# Patient Record
Sex: Male | Born: 1965 | Race: White | Hispanic: No | Smoking: Current every day smoker
Health system: Southern US, Community
[De-identification: ages and names within clinical notes are randomized; demographics above are authoritative.]

## PROBLEM LIST (undated history)

## (undated) DIAGNOSIS — I10 Essential (primary) hypertension: Secondary | ICD-10-CM

## (undated) DIAGNOSIS — C220 Liver cell carcinoma: Secondary | ICD-10-CM

## (undated) DIAGNOSIS — E119 Type 2 diabetes mellitus without complications: Secondary | ICD-10-CM

## (undated) DIAGNOSIS — B1920 Unspecified viral hepatitis C without hepatic coma: Secondary | ICD-10-CM

## (undated) DIAGNOSIS — L409 Psoriasis, unspecified: Secondary | ICD-10-CM

## (undated) DIAGNOSIS — F1910 Other psychoactive substance abuse, uncomplicated: Secondary | ICD-10-CM

## (undated) DIAGNOSIS — I639 Cerebral infarction, unspecified: Secondary | ICD-10-CM

## (undated) HISTORY — DX: Liver cell carcinoma: C22.0

## (undated) HISTORY — PX: KNEE SURGERY: SHX244

## (undated) HISTORY — DX: Other psychoactive substance abuse, uncomplicated: F19.10

## (undated) HISTORY — DX: Psoriasis, unspecified: L40.9

---

## 2005-10-14 ENCOUNTER — Emergency Department: Payer: Self-pay | Admitting: Unknown Physician Specialty

## 2005-10-15 ENCOUNTER — Emergency Department: Payer: Self-pay | Admitting: Emergency Medicine

## 2005-10-17 ENCOUNTER — Emergency Department: Payer: Self-pay | Admitting: General Practice

## 2005-10-19 ENCOUNTER — Emergency Department: Payer: Self-pay | Admitting: Emergency Medicine

## 2006-02-20 ENCOUNTER — Emergency Department: Payer: Self-pay | Admitting: Emergency Medicine

## 2006-05-26 ENCOUNTER — Emergency Department: Payer: Self-pay | Admitting: Emergency Medicine

## 2008-02-27 ENCOUNTER — Inpatient Hospital Stay: Payer: Self-pay | Admitting: Specialist

## 2008-04-17 ENCOUNTER — Emergency Department: Payer: Self-pay | Admitting: Emergency Medicine

## 2008-04-24 ENCOUNTER — Inpatient Hospital Stay: Payer: Self-pay | Admitting: Unknown Physician Specialty

## 2009-03-08 ENCOUNTER — Emergency Department: Payer: Self-pay | Admitting: Emergency Medicine

## 2012-09-27 ENCOUNTER — Emergency Department: Payer: Self-pay | Admitting: Internal Medicine

## 2015-02-22 ENCOUNTER — Inpatient Hospital Stay
Admission: EM | Admit: 2015-02-22 | Discharge: 2015-02-24 | DRG: 581 | Disposition: A | Discharge status: Home / Self Care | Payer: Self-pay | Attending: Internal Medicine | Admitting: Internal Medicine

## 2015-02-22 ENCOUNTER — Encounter: Payer: Self-pay | Admitting: Emergency Medicine

## 2015-02-22 DIAGNOSIS — Z7984 Long term (current) use of oral hypoglycemic drugs: Secondary | ICD-10-CM

## 2015-02-22 DIAGNOSIS — F17210 Nicotine dependence, cigarettes, uncomplicated: Secondary | ICD-10-CM | POA: Diagnosis present

## 2015-02-22 DIAGNOSIS — IMO0002 Reserved for concepts with insufficient information to code with codable children: Secondary | ICD-10-CM | POA: Insufficient documentation

## 2015-02-22 DIAGNOSIS — L03314 Cellulitis of groin: Secondary | ICD-10-CM | POA: Diagnosis present

## 2015-02-22 DIAGNOSIS — B182 Chronic viral hepatitis C: Secondary | ICD-10-CM | POA: Diagnosis present

## 2015-02-22 DIAGNOSIS — Z794 Long term (current) use of insulin: Secondary | ICD-10-CM

## 2015-02-22 DIAGNOSIS — A630 Anogenital (venereal) warts: Secondary | ICD-10-CM | POA: Diagnosis present

## 2015-02-22 DIAGNOSIS — F1010 Alcohol abuse, uncomplicated: Secondary | ICD-10-CM | POA: Diagnosis present

## 2015-02-22 DIAGNOSIS — E1169 Type 2 diabetes mellitus with other specified complication: Secondary | ICD-10-CM

## 2015-02-22 DIAGNOSIS — Z79899 Other long term (current) drug therapy: Secondary | ICD-10-CM

## 2015-02-22 DIAGNOSIS — L0291 Cutaneous abscess, unspecified: Secondary | ICD-10-CM | POA: Diagnosis present

## 2015-02-22 DIAGNOSIS — E1165 Type 2 diabetes mellitus with hyperglycemia: Secondary | ICD-10-CM | POA: Diagnosis present

## 2015-02-22 DIAGNOSIS — L02214 Cutaneous abscess of groin: Principal | ICD-10-CM | POA: Diagnosis present

## 2015-02-22 DIAGNOSIS — I10 Essential (primary) hypertension: Secondary | ICD-10-CM

## 2015-02-22 DIAGNOSIS — L0390 Cellulitis, unspecified: Secondary | ICD-10-CM

## 2015-02-22 HISTORY — DX: Type 2 diabetes mellitus without complications: E11.9

## 2015-02-22 HISTORY — DX: Unspecified viral hepatitis C without hepatic coma: B19.20

## 2015-02-22 HISTORY — DX: Essential (primary) hypertension: I10

## 2015-02-22 LAB — BASIC METABOLIC PANEL
Anion gap: 11 (ref 5–15)
BUN: 12 mg/dL (ref 6–20)
CALCIUM: 9.1 mg/dL (ref 8.9–10.3)
CO2: 24 mmol/L (ref 22–32)
Chloride: 99 mmol/L — ABNORMAL LOW (ref 101–111)
Creatinine, Ser: 0.62 mg/dL (ref 0.61–1.24)
GFR calc Af Amer: >60 mL/min (ref >60)
GFR calc non Af Amer: >60 mL/min (ref >60)
GLUCOSE: 259 mg/dL — AB (ref 65–99)
Potassium: 4 mmol/L (ref 3.5–5.1)
Sodium: 134 mmol/L — ABNORMAL LOW (ref 135–145)

## 2015-02-22 LAB — CBC WITH DIFFERENTIAL/PLATELET
BASOS PCT: 1 %
Basophils Absolute: 0 10*3/uL (ref 0–0.1)
EOS PCT: 1 %
Eosinophils Absolute: 0.1 10*3/uL (ref 0–0.7)
HCT: 47.7 % (ref 40.0–52.0)
Hemoglobin: 16.4 g/dL (ref 13.0–18.0)
Lymphocytes Relative: 16 %
Lymphs Abs: 1.4 10*3/uL (ref 1.0–3.6)
MCH: 32.7 pg (ref 26.0–34.0)
MCHC: 34.3 g/dL (ref 32.0–36.0)
MCV: 95.3 fL (ref 80.0–100.0)
MONO ABS: 1.1 10*3/uL — AB (ref 0.2–1.0)
Monocytes Relative: 12 %
Neutro Abs: 6.5 10*3/uL (ref 1.4–6.5)
Neutrophils Relative %: 72 %
PLATELETS: 137 10*3/uL — AB (ref 150–440)
RBC: 5 MIL/uL (ref 4.40–5.90)
RDW: 12.9 % (ref 11.5–14.5)
WBC: 9.1 10*3/uL (ref 3.8–10.6)

## 2015-02-22 LAB — GLUCOSE, CAPILLARY: Glucose-Capillary: 175 mg/dL — ABNORMAL HIGH (ref 65–99)

## 2015-02-22 LAB — PROTIME-INR
INR: 1.1
PROTHROMBIN TIME: 14.4 s (ref 11.4–15.0)

## 2015-02-22 LAB — APTT: aPTT: 29 seconds (ref 24–36)

## 2015-02-22 MED ORDER — VANCOMYCIN HCL 10 G IV SOLR
1250.0000 mg | INTRAVENOUS | Status: AC
  Administered 2015-02-22: 1250 mg via INTRAVENOUS
  Filled 2015-02-22: qty 1250

## 2015-02-22 MED ORDER — ONDANSETRON HCL 4 MG/2ML IJ SOLN
4.0000 mg | Freq: Once | INTRAMUSCULAR | Status: AC
  Administered 2015-02-22: 4 mg via INTRAVENOUS
  Filled 2015-02-22: qty 2

## 2015-02-22 MED ORDER — LABETALOL HCL 5 MG/ML IV SOLN
10.0000 mg | INTRAVENOUS | Status: DC | PRN
Start: 2015-02-22 — End: 2015-02-24
  Filled 2015-02-22: qty 4

## 2015-02-22 MED ORDER — MORPHINE SULFATE (PF) 4 MG/ML IV SOLN
4.0000 mg | Freq: Once | INTRAVENOUS | Status: AC
  Administered 2015-02-22: 4 mg via INTRAVENOUS
  Filled 2015-02-22: qty 1

## 2015-02-22 MED ORDER — PIPERACILLIN-TAZOBACTAM 3.375 G IVPB 30 MIN
3.3750 g | Freq: Once | INTRAVENOUS | Status: AC
  Administered 2015-02-22: 3.375 g via INTRAVENOUS
  Filled 2015-02-22: qty 50

## 2015-02-22 MED ORDER — PIPERACILLIN-TAZOBACTAM 3.375 G IVPB
3.3750 g | Freq: Three times a day (TID) | INTRAVENOUS | Status: DC
  Administered 2015-02-23 – 2015-02-24 (×4): 3.375 g via INTRAVENOUS
  Filled 2015-02-22 (×6): qty 50

## 2015-02-22 MED ORDER — ENOXAPARIN SODIUM 40 MG/0.4ML ~~LOC~~ SOLN
40.0000 mg | SUBCUTANEOUS | Status: DC
Start: 2015-02-22 — End: 2015-02-24
  Administered 2015-02-23: 40 mg via SUBCUTANEOUS
  Filled 2015-02-22: qty 0.4

## 2015-02-22 MED ORDER — SODIUM CHLORIDE 0.9 % IV SOLN
1250.0000 mg | Freq: Three times a day (TID) | INTRAVENOUS | Status: DC
  Administered 2015-02-23 – 2015-02-24 (×5): 1250 mg via INTRAVENOUS
  Filled 2015-02-22 (×9): qty 1250

## 2015-02-22 MED ORDER — MORPHINE SULFATE (PF) 2 MG/ML IV SOLN
2.0000 mg | INTRAVENOUS | Status: DC | PRN
  Administered 2015-02-22 – 2015-02-24 (×10): 2 mg via INTRAVENOUS
  Filled 2015-02-22 (×10): qty 1

## 2015-02-22 MED ORDER — HYDRALAZINE HCL 20 MG/ML IJ SOLN
10.0000 mg | Freq: Four times a day (QID) | INTRAMUSCULAR | Status: DC | PRN
  Administered 2015-02-22: 10 mg via INTRAVENOUS
  Filled 2015-02-22: qty 1

## 2015-02-22 MED ORDER — HYDRALAZINE HCL 20 MG/ML IJ SOLN: 10.0000 mg | INTRAMUSCULAR | Status: DC | PRN

## 2015-02-22 MED ORDER — LIDOCAINE HCL (PF) 1 % IJ SOLN
INTRAMUSCULAR | Status: AC
  Filled 2015-02-22: qty 10

## 2015-02-22 MED ORDER — SODIUM CHLORIDE 0.9 % IV BOLUS (SEPSIS)
1000.0000 mL | Freq: Once | INTRAVENOUS | Status: AC
  Administered 2015-02-22: 1000 mL via INTRAVENOUS

## 2015-02-22 MED ORDER — OXYCODONE-ACETAMINOPHEN 5-325 MG PO TABS: 1.0000 | ORAL_TABLET | ORAL | Status: DC | PRN

## 2015-02-22 MED ORDER — INSULIN ASPART 100 UNIT/ML ~~LOC~~ SOLN: 0.0000 [IU] | Freq: Three times a day (TID) | SUBCUTANEOUS | Status: DC

## 2015-02-22 MED ORDER — LABETALOL HCL 5 MG/ML IV SOLN
10.0000 mg | INTRAVENOUS | Status: DC | PRN
  Filled 2015-02-22: qty 4

## 2015-02-22 MED ORDER — AMLODIPINE BESYLATE 10 MG PO TABS
10.0000 mg | ORAL_TABLET | Freq: Every day | ORAL | Status: DC
  Administered 2015-02-22 – 2015-02-24 (×3): 10 mg via ORAL
  Filled 2015-02-22: qty 2
  Filled 2015-02-22 (×2): qty 1

## 2015-02-22 NOTE — ED Notes (Addendum)
Assisted pt to toilet. Pt bled while standing to urinate. Bleeding included 3 clots approximately quarter to half dollar size. Pt sheets and gown changed. Dr Marcelene Butte took a look at it and requested me to page Dr. Azalee Course, who should still be in the hospital.
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
## 2015-02-22 NOTE — Progress Notes (Addendum)
ED nurse, Catalina Antigua RN, gave RN report. Pt current BP 185/99, ask matt to recheck BP and call back prior to transferring pt.   Angus Seller

## 2015-02-22 NOTE — Progress Notes (Signed)
Pt arrived on unit. Current BP 211/103. Supervisor made aware of the BP and situation. 10mg  of hydralazine was given due to PRN order. Will recheck BP in one hour, and reassess pt. Will continue to monitor pt.   Angus Seller

## 2015-02-22 NOTE — Consult Note (Signed)
ANTIBIOTIC CONSULT NOTE - INITIAL  Pharmacy Consult for Vancomycin/Zosyn Indication: Cellulitis  Allergies no known allergies  Patient Measurements: Height: 6\' 1"  (185.4 cm) Weight: 210 lb (95.255 kg) IBW/kg (Calculated) : 79.9 Adjusted Body Weight: 86kg   Vital Signs: Temp: 98.2 F (36.8 C) (11/24 1554) Temp Source: Oral (11/24 1554) BP: 208/104 mmHg (11/24 1554) Pulse Rate: 84 (11/24 1554) Intake/Output from previous day:   Intake/Output from this shift:    Labs:  Recent Labs  02/22/15 1437  WBC 9.1  HGB 16.4  PLT 137*  CREATININE 0.62   Estimated Creatinine Clearance: 123.5 mL/min (by C-G formula based on Cr of 0.62).  Microbiology: No results found for this or any previous visit (from the past 720 hour(s)).  Medical History: Past Medical History  Diagnosis Date  . Diabetes mellitus without complication ([place])   . Hypertension   . Hepatitis C     Medications:  Scheduled:  . amLODipine  10 mg Oral Daily  . [START ON 02/23/2015] insulin aspart  0-9 Units Subcutaneous TID WC  . lidocaine (PF)       Infusions:  . piperacillin-tazobactam    . [START ON 02/23/2015] piperacillin-tazobactam (ZOSYN)  IV    . vancomycin    . [START ON 02/23/2015] vancomycin     Assessment: 49 y.o. male who presents for evaluation of 5 days swelling on the right groin, gradual onset, constant onset, worsening, associated with pain which is moderate to severe.  Goal of Therapy:  Vancomycin trough level 10-15 mcg/ml  Plan:  Ordered Vancomycin 1250mg  IV once (~19:00) followed by Vancomycin 1250mg  IV Q8H to begin 11/25 at 01:00.   Trough level to be drawn prior to 5th dose on 11/26 at 00:30.  Ordered Zosyn 3.375g EI Q8H.    Olivia Canter, RPh Clinical Pharmacist 02/22/2015,6:55 PM
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
## 2015-02-22 NOTE — ED Notes (Signed)
Cleaned around incision site, no active bleeding at this time.

## 2015-02-22 NOTE — Progress Notes (Signed)
Pt. current BP is 197/88. Hydralazine 10mg  given at 2028. [facility] doc notified. New orders given. Labetalol 10mg  IV Q2 PRN and Hydralazine 10mg  IV Q4 PRN. Will continue to monitor pt.  Angus Seller

## 2015-02-22 NOTE — ED Notes (Signed)
Last week, noticed a "knot" to right groin.  Area "busted" and has been draining a thick, sticky bloody drainage.  A second "knot" area noticed to pubis area.  No drainage noted from second area.  First area continues to drain.

## 2015-02-22 NOTE — ED Provider Notes (Signed)
-----------------------------------------   6:00 PM on 02/22/2015 -----------------------------------------   Blood pressure 208/104, pulse 84, temperature 98.2 F (36.8 C), temperature source Oral, resp. rate 16, height 6\' 1"  (1.854 m), weight 210 lb (95.255 kg), SpO2 93 %.  Assuming care from Dr. Edd Fabian  In short, Johnny Kelley is a 49 y.o. male with a chief complaint of Abscess .  Refer to the original H&P for additional details.  The current plan of care is to follow-up with surgical consultation. Dr. Heath Lark came to see and evaluate the patient here in emergency department did bedside I&D. She felt patient required further medical admission and overnight observation for wound care etc.  Daymon Larsen, MD 02/22/15 [phone]
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
## 2015-02-23 LAB — CBC
HEMATOCRIT: 45 % (ref 40.0–52.0)
HEMOGLOBIN: 15.5 g/dL (ref 13.0–18.0)
MCH: 32.9 pg (ref 26.0–34.0)
MCHC: 34.5 g/dL (ref 32.0–36.0)
MCV: 95.6 fL (ref 80.0–100.0)
Platelets: 119 10*3/uL — ABNORMAL LOW (ref 150–440)
RBC: 4.71 MIL/uL (ref 4.40–5.90)
RDW: 13.3 % (ref 11.5–14.5)
WBC: 8.5 10*3/uL (ref 3.8–10.6)

## 2015-02-23 LAB — BASIC METABOLIC PANEL
Anion gap: 7 (ref 5–15)
BUN: 10 mg/dL (ref 6–20)
CO2: 26 mmol/L (ref 22–32)
CREATININE: 0.57 mg/dL — AB (ref 0.61–1.24)
Calcium: 8.3 mg/dL — ABNORMAL LOW (ref 8.9–10.3)
Chloride: 101 mmol/L (ref 101–111)
GFR calc non Af Amer: >60 mL/min (ref >60)
Glucose, Bld: 201 mg/dL — ABNORMAL HIGH (ref 65–99)
Potassium: 3.8 mmol/L (ref 3.5–5.1)
SODIUM: 134 mmol/L — AB (ref 135–145)

## 2015-02-23 LAB — GLUCOSE, CAPILLARY
GLUCOSE-CAPILLARY: 241 mg/dL — AB (ref 65–99)
GLUCOSE-CAPILLARY: 247 mg/dL — AB (ref 65–99)
GLUCOSE-CAPILLARY: 331 mg/dL — AB (ref 65–99)
Glucose-Capillary: 268 mg/dL — ABNORMAL HIGH (ref 65–99)
Glucose-Capillary: 260 mg/dL — ABNORMAL HIGH (ref 65–99)
Glucose-Capillary: 225 mg/dL — ABNORMAL HIGH (ref 65–99)

## 2015-02-23 LAB — HEMOGLOBIN A1C
HEMOGLOBIN A1C: 9.9 % — AB (ref 4.0–6.0)
Hgb A1c MFr Bld: 9.8 % — ABNORMAL HIGH (ref 4.0–6.0)

## 2015-02-23 MED ORDER — INSULIN ASPART 100 UNIT/ML ~~LOC~~ SOLN
0.0000 [IU] | Freq: Three times a day (TID) | SUBCUTANEOUS | Status: DC
  Administered 2015-02-23 (×2): 5 [IU] via SUBCUTANEOUS
  Administered 2015-02-23 – 2015-02-24 (×3): 3 [IU] via SUBCUTANEOUS
  Administered 2015-02-24: 5 [IU] via SUBCUTANEOUS
  Filled 2015-02-23: qty 3
  Filled 2015-02-23 (×2): qty 5
  Filled 2015-02-23: qty 3
  Filled 2015-02-23: qty 5
  Filled 2015-02-23: qty 3

## 2015-02-23 MED ORDER — INSULIN ASPART 100 UNIT/ML ~~LOC~~ SOLN
7.0000 [IU] | Freq: Once | SUBCUTANEOUS | Status: AC
  Administered 2015-02-23: 7 [IU] via SUBCUTANEOUS
  Filled 2015-02-23: qty 7

## 2015-02-23 MED ORDER — NICOTINE 21 MG/24HR TD PT24
21.0000 mg | MEDICATED_PATCH | Freq: Every day | TRANSDERMAL | Status: DC
  Filled 2015-02-23 (×2): qty 1

## 2015-02-23 MED ORDER — METFORMIN HCL 850 MG PO TABS
850.0000 mg | ORAL_TABLET | Freq: Two times a day (BID) | ORAL | Status: DC
  Administered 2015-02-23 – 2015-02-24 (×2): 850 mg via ORAL
  Filled 2015-02-23 (×3): qty 1

## 2015-02-23 NOTE — Care Management (Addendum)
Care management consult for uninsured patient without PCP.  Discussed case with attending Dr Posey Pronto. Patient will need home meds filled. Unfortunately Medication Management clinic is not open today due to [facility] schedule. Contacted Mary at our pharmacy and medications can not be filled on site per policy. Patient was setup with [place] program and total cost of medications to patient will be $9.00 at listed pharmacies of which [facility] participates.  Also spoke with patient who is alert and oriented and stated that he makes approximately $30 a week and that it will be hard for him to come up with money for meds but that he thinks he can get the money from family. Patient given applications for [facility] and [facility] and I explained in great detail how to apply for these services. Patient expressed understanding and agrees to apply. Reviewed patient condition and medications and did teaching with patient concerning each medication and the importance of controlling blood sugar, blood pressure and taking antibiotic as prescribed. Patient expressed understanding.  Have concerns about patient compliance after discharge however patient given multiple resources to assist himself with medical care.  [facility] discount card and Rx on chart along with list of participating pharmacies.  Expect discharge tomorrow.

## 2015-02-23 NOTE — Progress Notes (Signed)
Put in order for dressing change per Dr. Caesar Chestnut for dressing change per shift. Pack with iodoform gauze and 4 x 4 on top.

## 2015-02-23 NOTE — Progress Notes (Signed)
4X4 Gauze and abdominal pad dressings were reinforced to the Right groin area due saturation of previous 4X4 gauze dressing. There are no dressing change orders at this time. Will continue to assess area.  Angus Seller

## 2015-02-23 NOTE — Progress Notes (Signed)
49 yr old male with multiple medical issues including Hepatitis C, uncontrolled HTN, diabetes and alcohol abuse, s/p Right groin abscess I&D.  Patient states pain much improved today, feels as if it is getting better.  Patient denies any fever, chills, nausea ,or vomiting.   Filed Vitals:   02/23/15 0942 02/23/15 1133  BP: 175/91 173/89  Pulse: 77 79  Temp:  98.6 F (37 C)  Resp:  17   PE:  Gen: NAD, AAOx3 Res: CTAB/L Cardio: RRR Groin incision: packing removed, clean base, erythema and induration much improved as well    A/P: Continue changing packing with 1' iodoform BID, will need to teach patient to pack himself.  He may shower just change packing afterward.

## 2015-02-23 NOTE — Progress Notes (Signed)
Patient ID: Johnny Kelley, male   DOB: 02/05/1966, 49 y.o.   MRN: [id number] [place] at [place] NAME: Johnny Kelley    MR#:  [id number]  DATE OF BIRTH:  11-24-1965  SUBJECTIVE:   Came in after he developed right groin abscess. REVIEW OF SYSTEMS:   Review of Systems  Constitutional: Negative for fever, chills and weight loss.  HENT: Negative for ear discharge, ear pain and nosebleeds.   Eyes: Negative for blurred vision, pain and discharge.  Respiratory: Negative for sputum production, shortness of breath, wheezing and stridor.   Cardiovascular: Negative for chest pain, palpitations, orthopnea and PND.  Gastrointestinal: Negative for nausea, vomiting, abdominal pain and diarrhea.  Genitourinary: Negative for urgency and frequency.  Musculoskeletal: Negative for back pain and joint pain.  Skin:       Abscess right groin  Neurological: Positive for weakness. Negative for sensory change, speech change and focal weakness.  Psychiatric/Behavioral: Negative for depression and hallucinations. The patient is not nervous/anxious.    Tolerating Diet: Yes Tolerating PT: Not needed  DRUG ALLERGIES:  No Known Allergies  VITALS:  Blood pressure 173/89, pulse 79, temperature 98.6 F (37 C), temperature source Oral, resp. rate 17, height 6\' 1"  (1.854 m), weight 98.839 kg (217 lb 14.4 oz), SpO2 97 %.  PHYSICAL EXAMINATION:   Physical Exam  GENERAL:  49 y.o.-year-old patient lying in the bed with no acute distress.  EYES: Pupils equal, round, reactive to light and accommodation. No scleral icterus. Extraocular muscles intact.  HEENT: Head atraumatic, normocephalic. Oropharynx and nasopharynx clear.  NECK:  Supple, no jugular venous distention. No thyroid enlargement, no tenderness.  LUNGS: Normal breath sounds bilaterally, no wheezing, rales, rhonchi. No use of accessory muscles of respiration.  CARDIOVASCULAR: S1, S2 normal. No  murmurs, rubs, or gallops.  ABDOMEN: Soft, nontender, nondistended. Bowel sounds present. No organomegaly or mass.  EXTREMITIES: No cyanosis, clubbing or edema b/l.   Right groin abscess status post I&D and packing present NEUROLOGIC: Cranial nerves II through XII are intact. No focal Motor or sensory deficits b/l.   PSYCHIATRIC: The patient is alert and oriented x 3.  SKIN: No obvious rash, lesion, or ulcer.    LABORATORY PANEL:   CBC  Recent Labs Lab 02/23/15 0528  WBC 8.5  HGB 15.5  HCT 45.0  PLT 119*    Chemistries   Recent Labs Lab 02/23/15 0528  NA 134*  K 3.8  CL 101  CO2 26  GLUCOSE 201*  BUN 10  CREATININE 0.57*  CALCIUM 8.3*    Cardiac Enzymes No results for input(s): TROPONINI in the last 168 hours.  RADIOLOGY:  No results found.   ASSESSMENT AND PLAN:   *Cellulitis and abscess in right groin Status post I and D by surgery. Patient said he has history of staph infection a few years ago Continue IV vancomycin and Zosyn for now. Change to by mouth Bactrim in a.m.  * Uncontrolled hypertension Not taking medications at home. Start on amlodipine 10 mg. Given hydralazine injection as needed for elevated systolic blood pressure.  * Diabetes We will keep him on insulin sliding scale coverage.  -Start on by mouth metformin  * Chronic hepatitis C He is not following with any doctor currently  * Smoking not motivated on smoking cessation 3 minutes spent   Management to help with medication  Case discussed with Care Management/Social Worker. Management plans discussed with the patient, family and they are in  agreement.  CODE STATUS: Full  DVT Prophylaxis: Lovenox  TOTAL TIME TAKING CARE OF THIS PATIENT: 30 minutes.  >50% time spent on counselling and coordination of care  POSSIBLE D/C IN one DAYS, DEPENDING ON CLINICAL CONDITION.   Johnny Kelley M.D on 02/23/2015 at 2:05 PM  Between 7am to 6pm - Pager - [phone]  After  6pm go to [email or web address] - password [email or web address] [facility]  [place] [facility]  Office  [phone]  CC: Primary care physician; No primary care provider on file.

## 2015-02-23 NOTE — Progress Notes (Signed)
Pt's CBG 331, Prime doc notified. New orders given. 7 units of Novolog to be given at this time and will recheck CBG in 1 hour after admission. Will continue to monitor pt.   Angus Seller

## 2015-02-24 LAB — GLUCOSE, CAPILLARY
GLUCOSE-CAPILLARY: 221 mg/dL — AB (ref 65–99)
Glucose-Capillary: 291 mg/dL — ABNORMAL HIGH (ref 65–99)

## 2015-02-24 LAB — VANCOMYCIN, TROUGH: Vancomycin Tr: 10 ug/mL (ref 10–20)

## 2015-02-24 MED ORDER — OXYCODONE-ACETAMINOPHEN 5-325 MG PO TABS: 1.0000 | ORAL_TABLET | ORAL | Status: DC | PRN

## 2015-02-24 MED ORDER — SULFAMETHOXAZOLE-TRIMETHOPRIM 800-160 MG PO TABS: 1.0000 | ORAL_TABLET | Freq: Two times a day (BID) | ORAL | Status: DC

## 2015-02-24 MED ORDER — METFORMIN HCL 850 MG PO TABS: 850.0000 mg | ORAL_TABLET | Freq: Two times a day (BID) | ORAL | Status: DC

## 2015-02-24 MED ORDER — SULFAMETHOXAZOLE-TRIMETHOPRIM 800-160 MG PO TABS
1.0000 | ORAL_TABLET | Freq: Two times a day (BID) | ORAL | Status: DC
  Administered 2015-02-24: 1 via ORAL
  Filled 2015-02-24: qty 1

## 2015-02-24 MED ORDER — AMLODIPINE BESYLATE 10 MG PO TABS: 10.0000 mg | ORAL_TABLET | Freq: Every day | ORAL | Status: DC

## 2015-02-24 NOTE — Discharge Summary (Signed)
[place] at [place] NAME: Johnny Kelley    MR#:  [id number]  DATE OF BIRTH:  Mar 14, 1966  DATE OF ADMISSION:  02/22/2015 ADMITTING PHYSICIAN: Vaughan Basta, MD  DATE OF DISCHARGE: 02/24/15  PRIMARY CARE PHYSICIAN: No primary care provider on file.    ADMISSION DIAGNOSIS:  Abscess of groin, right [L02.214]  DISCHARGE DIAGNOSIS:  Abscess right groin s/p I and D Uncontrolled DM-2  HTN SECONDARY DIAGNOSIS:   Past Medical History  Diagnosis Date  . Diabetes mellitus without complication ([place])   . Hypertension   . Hepatitis C     HOSPITAL COURSE:  *Cellulitis and abscess in right groin Status post I and D by surgery. Change to by mouth Bactrim today. Pt's nephew to help with dressing changes  * Uncontrolled hypertension Not taking medications at home. Start on amlodipine 10 mg.  * Diabetes We will keep him on insulin sliding scale coverage.  -Started on by mouth metformin  * Chronic hepatitis C He is not following with any doctor currently  D/c home later today Recommended out pt f/u with surgery CONSULTS OBTAINED:  Treatment Team:  Hubbard Robinson, MD  DRUG ALLERGIES:  No Known Allergies  DISCHARGE MEDICATIONS:   Current Discharge Medication List    START taking these medications   Details  amLODipine (NORVASC) 10 MG tablet Take 1 tablet (10 mg total) by mouth daily. Qty: 30 tablet, Refills: 2    metFORMIN (GLUCOPHAGE) 850 MG tablet Take 1 tablet (850 mg total) by mouth 2 (two) times daily with a meal. Qty: 60 tablet, Refills: 0    oxyCODONE-acetaminophen (PERCOCET/ROXICET) 5-325 MG tablet Take 1 tablet by mouth every 4 (four) hours as needed for moderate pain. Qty: 30 tablet, Refills: 0    sulfamethoxazole-trimethoprim (BACTRIM DS,SEPTRA DS) 800-160 MG tablet Take 1 tablet by mouth every 12 (twelve) hours. Qty: 20 tablet, Refills: 0        If you experience worsening of  your admission symptoms, develop shortness of breath, life threatening emergency, suicidal or homicidal thoughts you must seek medical attention immediately by calling 911 or calling your MD immediately  if symptoms less severe.  You Must read complete instructions/literature along with all the possible adverse reactions/side effects for all the Medicines you take and that have been prescribed to you. Take any new Medicines after you have completely understood and accept all the possible adverse reactions/side effects.   Please note  You were cared for by a hospitalist during your hospital stay. If you have any questions about your discharge medications or the care you received while you were in the hospital after you are discharged, you can call the unit and asked to speak with the hospitalist on call if the hospitalist that took care of you is not available. Once you are discharged, your primary care physician will handle any further medical issues. Please note that NO REFILLS for any discharge medications will be authorized once you are discharged, as it is imperative that you return to your primary care physician (or establish a relationship with a primary care physician if you do not have one) for your aftercare needs so that they can reassess your need for medications and monitor your lab values. Today   SUBJECTIVE  Doing well   VITAL SIGNS:  Blood pressure 159/90, pulse 72, temperature 98 F (36.7 C), temperature source Oral, resp. rate 18, height 6\' 1"  (1.854 m), weight 217 lb 14.4 oz (98.839 kg), SpO2  96 %.  I/O:   Intake/Output Summary (Last 24 hours) at 02/24/15 1013 Last data filed at 02/24/15 0900  Gross per 24 hour  Intake   1330 ml  Output   1000 ml  Net    330 ml    PHYSICAL EXAMINATION:  GENERAL:  49 y.o.-year-old patient lying in the bed with no acute distress.  EYES: Pupils equal, round, reactive to light and accommodation. No scleral icterus. Extraocular muscles  intact.  HEENT: Head atraumatic, normocephalic. Oropharynx and nasopharynx clear.  NECK:  Supple, no jugular venous distention. No thyroid enlargement, no tenderness.  LUNGS: Normal breath sounds bilaterally, no wheezing, rales,rhonchi or crepitation. No use of accessory muscles of respiration.  CARDIOVASCULAR: S1, S2 normal. No murmurs, rubs, or gallops.  ABDOMEN: Soft, non-tender, non-distended. Bowel sounds present. No organomegaly or mass.  EXTREMITIES: No pedal edema, cyanosis, or clubbing. Right groin dressing + NEUROLOGIC: Cranial nerves II through XII are intact. Muscle strength 5/5 in all extremities. Sensation intact. Gait not checked.  PSYCHIATRIC: The patient is alert and oriented x 3.  SKIN: No obvious rash, lesion, or ulcer.   DATA REVIEW:   CBC   Recent Labs Lab 02/23/15 0528  WBC 8.5  HGB 15.5  HCT 45.0  PLT 119*    Chemistries   Recent Labs Lab 02/23/15 0528  NA 134*  K 3.8  CL 101  CO2 26  GLUCOSE 201*  BUN 10  CREATININE 0.57*  CALCIUM 8.3*    Microbiology Results   No results found for this or any previous visit (from the past 240 hour(s)).  RADIOLOGY:  No results found.   Management plans discussed with the patient, family and they are in agreement.  CODE STATUS:     Code Status Orders        Start     Ordered   02/22/15 2027  Full code   Continuous     02/22/15 2026      TOTAL TIME TAKING CARE OF THIS PATIENT: 40 minutes.    [name] M.D on 02/24/2015 at 10:13 AM  Between 7am to 6pm - Pager - [phone] After 6pm go to [email or web address] - password [email or web address] [facility]  [place] [facility]  Office  [phone]  CC: Primary care physician; No primary care provider on file.

## 2015-02-24 NOTE — Discharge Instructions (Signed)
Try to see [facility] at [facility] Abscess An abscess is an infected area that contains a collection of pus and debris.It can occur in almost any part of the body. An abscess is also known as a furuncle or boil. CAUSES  An abscess occurs when tissue gets infected. This can occur from blockage of oil or sweat glands, infection of hair follicles, or a minor injury to the skin. As the body tries to fight the infection, pus collects in the area and creates pressure under the skin. This pressure causes pain. People with weakened immune systems have difficulty fighting infections and get certain abscesses more often.  SYMPTOMS Usually an abscess develops on the skin and becomes a painful mass that is red, warm, and tender. If the abscess forms under the skin, you may feel a moveable soft area under the skin. Some abscesses break open (rupture) on their own, but most will continue to get worse without care. The infection can spread deeper into the body and eventually into the bloodstream, causing you to feel ill.  DIAGNOSIS  Your caregiver will take your medical history and perform a physical exam. A sample of fluid may also be taken from the abscess to determine what is causing your infection. TREATMENT  Your caregiver may prescribe antibiotic medicines to fight the infection. However, taking antibiotics alone usually does not cure an abscess. Your caregiver may need to make a small cut (incision) in the abscess to drain the pus. In some cases, gauze is packed into the abscess to reduce pain and to continue draining the area. HOME CARE INSTRUCTIONS   Only take over-the-counter or prescription medicines for pain, discomfort, or fever as directed by your caregiver.  If you were prescribed antibiotics, take them as directed. Finish them even if you start to feel better.  If gauze is used, follow your caregiver's directions for changing the gauze.  To avoid spreading the infection:  Keep your draining  abscess covered with a bandage.  Wash your hands well.  Do not share personal care items, towels, or whirlpools with others.  Avoid skin contact with others.  Keep your skin and clothes clean around the abscess.  Keep all follow-up appointments as directed by your caregiver. SEEK MEDICAL CARE IF:   You have increased pain, swelling, redness, fluid drainage, or bleeding.  You have muscle aches, chills, or a general ill feeling.  You have a fever. MAKE SURE YOU:   Understand these instructions.  Will watch your condition.  Will get help right away if you are not doing well or get worse.   This information is not intended to replace advice given to you by your health care provider. Make sure you discuss any questions you have with your health care provider.   Document Released: 12/25/2004 Document Revised: 09/16/2011 Document Reviewed: 05/30/2011 Elsevier Interactive Patient Education [employer].

## 2015-02-24 NOTE — Care Management Note (Signed)
Case Management Note  Patient Details  Name: KEMARIAN AREVALO MRN: [id number] Date of Birth: 13-Aug-1965  Subjective/Objective:    Provided uninsured Mr Palin with the [facility] Program Discount card, with Rx attached, and a list of participating pharmacies, left in chart by Liliane Channel, case Freight forwarder. All meds obtained through the [facility] Discount program will cost $3.00 each. Liliane Channel provided Mr Mcgillivary with community resources where he can obtain medical care and medications. The [facility] Program is a one time a year medication assistance program.                   Action/Plan:   Expected Discharge Date:                  Expected Discharge Plan:     In-House Referral:     Discharge planning Services     Post Acute Care Choice:    Choice offered to:     DME Arranged:    DME Agency:     HH Arranged:    [place] Agency:     Status of Service:     Medicare Important Message Given:    Date Medicare IM Given:    Medicare IM give by:    Date Additional Medicare IM Given:    Additional Medicare Important Message give by:     If discussed at [place] of Stay Meetings, dates discussed:    Additional Comments:  Tyrail Grandfield A, RN 02/24/2015, 10:29 AM

## 2015-02-24 NOTE — Consult Note (Signed)
ANTIBIOTIC CONSULT NOTE - INITIAL  Pharmacy Consult for Vancomycin/Zosyn Indication: Cellulitis  No Known Allergies  Patient Measurements: Height: 6\' 1"  (185.4 cm) Weight: 217 lb 14.4 oz (98.839 kg) IBW/kg (Calculated) : 79.9 Adjusted Body Weight: 86kg   Vital Signs: Temp: 98.6 F (37 C) (11/25 2030) Temp Source: Oral (11/25 2030) BP: 179/89 mmHg (11/25 2030) Pulse Rate: 81 (11/25 2030) Intake/Output from previous day: 11/25 0701 - 11/26 0700 In: 650 [IV Piggyback:650] Out: 1100 [Urine:1100] Intake/Output from this shift: Total I/O In: 300 [IV Piggyback:300] Out: -   Labs:  Recent Labs  02/22/15 1437 02/23/15 0528  WBC 9.1 8.5  HGB 16.4 15.5  PLT 137* 119*  CREATININE 0.62 0.57*   Estimated Creatinine Clearance: 135.2 mL/min (by C-G formula based on Cr of 0.57).  Microbiology: No results found for this or any previous visit (from the past 720 hour(s)).  Medical History: Past Medical History  Diagnosis Date  . Diabetes mellitus without complication ([place])   . Hypertension   . Hepatitis C     Medications:  Scheduled:  . amLODipine  10 mg Oral Daily  . enoxaparin (LOVENOX) injection  40 mg Subcutaneous Q24H  . insulin aspart  0-9 Units Subcutaneous TID AC & HS  . metFORMIN  850 mg Oral BID WC  . nicotine  21 mg Transdermal Daily  . piperacillin-tazobactam (ZOSYN)  IV  3.375 g Intravenous Q8H  . vancomycin  1,250 mg Intravenous Q8H   Infusions:    Assessment: 49 y.o. male who presents for evaluation of 5 days swelling on the right groin, gradual onset, constant onset, worsening, associated with pain which is moderate to severe.  Goal of Therapy:  Vancomycin trough level 10-15 mcg/ml  Plan:  Vancomycin trough 10 mcg/mL. Expect discharge and change to oral Bactrim tomorrow. No change.    Laural Benes, Pharm.D., BCPS Clinical Pharmacist 02/24/2015,1:08 AM

## 2015-02-24 NOTE — Progress Notes (Signed)
49 yr old male with multiple medical issues including Hepatitis C, uncontrolled HTN, diabetes and alcohol abuse, s/p Right groin abscess I&D.  Patient doing better today, wants to go home. States he has a nephew that can help with packing the wound  Filed Vitals:   02/24/15 0500 02/24/15 0508  BP: 167/91 129/68  Pulse: 72 66  Temp: 98.1 F (36.7 C) 98 F (36.7 C)  Resp: 20 18   PE:  Gen: NAD, AAOx3 Res: CTAB/L Cardio: RRR Groin incision: packing removed, clean base, erythema resolved, induration greatly improved   A/P: Continue changing packing with 1' iodoform BID, will need to teach nephew.  He may shower just change packing afterward.  He can f/u in clinic in 1-2 weeks with [facility] surgical for wound check.

## 2015-02-24 NOTE — Progress Notes (Signed)
Pt A and O x 4. VSS. Pt tolerating diet well. Minimal complaints of pain with meds given to control. No complaints of nausea. IV removed intact, prescriptions given. Case manager met with patient to try to help with filling his prescriptions. Family member instructed on how to do dressing changes. Pt voiced understanding of discharge instructions with no further questions. Pt discharged via wheelchair with nurse aide.   

## 2015-03-02 LAB — WOUND CULTURE

## 2015-04-18 ENCOUNTER — Encounter: Payer: Self-pay | Admitting: *Deleted

## 2015-04-18 ENCOUNTER — Emergency Department
Admission: EM | Admit: 2015-04-18 | Discharge: 2015-04-18 | Disposition: A | Discharge status: Home / Self Care | Payer: Self-pay | Attending: Emergency Medicine | Admitting: Emergency Medicine

## 2015-04-18 DIAGNOSIS — I10 Essential (primary) hypertension: Secondary | ICD-10-CM | POA: Insufficient documentation

## 2015-04-18 DIAGNOSIS — L0390 Cellulitis, unspecified: Secondary | ICD-10-CM

## 2015-04-18 DIAGNOSIS — L0291 Cutaneous abscess, unspecified: Secondary | ICD-10-CM

## 2015-04-18 DIAGNOSIS — F17210 Nicotine dependence, cigarettes, uncomplicated: Secondary | ICD-10-CM | POA: Insufficient documentation

## 2015-04-18 DIAGNOSIS — L02414 Cutaneous abscess of left upper limb: Secondary | ICD-10-CM | POA: Insufficient documentation

## 2015-04-18 DIAGNOSIS — E119 Type 2 diabetes mellitus without complications: Secondary | ICD-10-CM | POA: Insufficient documentation

## 2015-04-18 DIAGNOSIS — Z79899 Other long term (current) drug therapy: Secondary | ICD-10-CM | POA: Insufficient documentation

## 2015-04-18 DIAGNOSIS — Z7984 Long term (current) use of oral hypoglycemic drugs: Secondary | ICD-10-CM | POA: Insufficient documentation

## 2015-04-18 DIAGNOSIS — L03114 Cellulitis of left upper limb: Secondary | ICD-10-CM | POA: Insufficient documentation

## 2015-04-18 LAB — GLUCOSE, CAPILLARY: GLUCOSE-CAPILLARY: 291 mg/dL — AB (ref 65–99)

## 2015-04-18 MED ORDER — SULFAMETHOXAZOLE-TRIMETHOPRIM 800-160 MG PO TABS: 1.0000 | ORAL_TABLET | Freq: Two times a day (BID) | ORAL | Status: DC

## 2015-04-18 MED ORDER — LIDOCAINE HCL (PF) 1 % IJ SOLN
INTRAMUSCULAR | Status: AC
  Filled 2015-04-18: qty 5

## 2015-04-18 MED ORDER — OXYCODONE-ACETAMINOPHEN 5-325 MG PO TABS: 1.0000 | ORAL_TABLET | ORAL | Status: DC | PRN

## 2015-04-18 MED ORDER — CLONIDINE HCL 0.1 MG PO TABS
0.1000 mg | ORAL_TABLET | Freq: Once | ORAL | Status: AC
  Administered 2015-04-18: 0.1 mg via ORAL
  Filled 2015-04-18: qty 1

## 2015-04-18 NOTE — ED Provider Notes (Signed)
[facility] Emergency Department Provider Note  ____________________________________________  Time seen: Approximately 7:52 AM  I have reviewed the triage vital signs and the nursing notes.   HISTORY  Chief Complaint Abscess   HPI Johnny Kelley is a 50 y.o. male with known hypertension, diabetes, and hepatitis C. Patient presents for evaluation of abscess to his left elbow for 2 days. Denies any known trauma.   Past Medical History  Diagnosis Date  . Diabetes mellitus without complication ([place])   . Hypertension   . Hepatitis C     Patient Active Problem List   Diagnosis Date Noted  . Cellulitis and abscess 02/22/2015  . Hypertension 02/22/2015  . Abscess 02/22/2015  . Abscess of groin, right   . DM w/ coma type II, uncontrolled ([place])     Past Surgical History  Procedure Laterality Date  . Knee surgery Right     Current Outpatient Rx  Name  Route  Sig  Dispense  Refill  . amLODipine (NORVASC) 10 MG tablet   Oral   Take 1 tablet (10 mg total) by mouth daily.   30 tablet   2   . metFORMIN (GLUCOPHAGE) 850 MG tablet   Oral   Take 1 tablet (850 mg total) by mouth 2 (two) times daily with a meal.   60 tablet   0   . oxyCODONE-acetaminophen (ROXICET) 5-325 MG tablet   Oral   Take 1-2 tablets by mouth every 4 (four) hours as needed for severe pain.   15 tablet   0   . sulfamethoxazole-trimethoprim (BACTRIM DS,SEPTRA DS) 800-160 MG tablet   Oral   Take 1 tablet by mouth 2 (two) times daily.   20 tablet   0     Allergies Review of patient's allergies indicates no known allergies.  Family History  Problem Relation Age of Onset  . Colon cancer Father   . Diabetes Father     Social History Social History  Substance Use Topics  . Smoking status: Current Every Day Smoker -- 0.50 packs/day    Types: Cigarettes  . Smokeless tobacco: Never Used  . Alcohol Use: Yes     Comment: 4-5 24 oz beer daily    Review of  Systems Constitutional: No fever/chills Eyes: No visual changes. ENT: No sore throat. Cardiovascular: Denies chest pain. Respiratory: Denies shortness of breath. Gastrointestinal: No abdominal pain.  No nausea, no vomiting.  No diarrhea.  No constipation. Genitourinary: Negative for dysuria. Musculoskeletal: Negative for back pain. Skin: Positive for abscess and left elbow. Neurological: Negative for headaches, focal weakness or numbness.  10-point ROS otherwise negative.  ____________________________________________   PHYSICAL EXAM:  VITAL SIGNS: ED Triage Vitals  Enc Vitals Group     BP 04/18/15 0744 219/115 mmHg     Pulse Rate 04/18/15 0744 85     Resp 04/18/15 0744 18     Temp 04/18/15 0744 98.5 F (36.9 C)     Temp Source 04/18/15 0744 Oral     SpO2 04/18/15 0744 96 %     Weight 04/18/15 0744 210 lb (95.255 kg)     Height 04/18/15 0744 6\' 2"  (1.88 m)     Head Cir --      Peak Flow --      Pain Score 04/18/15 0744 10     Pain Loc --      Pain Edu? --      Excl. in [place] --     Constitutional: Alert and oriented.  Well appearing and in no acute distress. Cardiovascular: Normal rate, regular rhythm. Grossly normal heart sounds.  Good peripheral circulation. Respiratory: Normal respiratory effort.  No retractions. Lungs CTAB. Gastrointestinal: Soft and nontender. No distention. No abdominal bruits. No CVA tenderness. Musculoskeletal: No lower extremity tenderness nor edema.  No joint effusions. Neurologic:  Normal speech and language. No gross focal neurologic deficits are appreciated. No gait instability. Skin:  Skin is warm, dry and intact. No rash noted. Left elbow with obvious pustular lesion with 2 x 2 area of erythema Psychiatric: Mood and affect are normal. Speech and behavior are normal.  ____________________________________________   LABS (all labs ordered are listed, but only abnormal results are displayed)  Labs Reviewed  GLUCOSE, CAPILLARY - Abnormal;  Notable for the following:    Glucose-Capillary 291 (*)    All other components within normal limits  CBG MONITORING, ED   ____________________________________________    PROCEDURES  Procedure(s) performed: Yes  INCISION AND DRAINAGE Performed by: Arlyss Repress Consent: Verbal consent obtained. Risks and benefits: risks, benefits and alternatives were discussed Type: abscess  Body area: Left elbow  Anesthesia: local infiltration  Incision was made with a scalpel.  Local anesthetic: lidocaine 1% without epinephrine  Anesthetic total: 3 ml  Complexity: complex Blunt dissection to break up loculations  Drainage: purulent  Drainage amount: Moderate   Packing material: 1/4 in iodoform gauze  Patient tolerance: Patient tolerated the procedure well with no immediate complications.   Critical Care performed: No  ____________________________________________   INITIAL IMPRESSION / ASSESSMENT AND PLAN / ED COURSE  Pertinent labs & imaging results that were available during my care of the patient were reviewed by me and considered in my medical decision making (see chart for details).  Abscess left elbow. Hypertension. Patient was given clonidine 0.1 mg by mouth while in the ED. Return to the ER in 48 hours for I&D wound check and replaced the dressing. Urged patient to restart his blood pressure and blood sugar medications. Patient tolerated the procedure well voices no other medical: Complaints at this time ____________________________________________   FINAL CLINICAL IMPRESSION(S) / ED DIAGNOSES  Final diagnoses:  Abscess and cellulitis      Arlyss Repress, PA-C 04/18/15 [place], MD 04/18/15 [phone]

## 2015-04-18 NOTE — ED Notes (Signed)
Abscess to left elbow area, pt states he noticed it 2 days ago, slightly draining at present

## 2015-04-18 NOTE — Discharge Instructions (Signed)

## 2015-04-22 ENCOUNTER — Emergency Department
Admission: EM | Admit: 2015-04-22 | Discharge: 2015-04-22 | Disposition: A | Discharge status: Home / Self Care | Payer: Self-pay | Attending: Student | Admitting: Student

## 2015-04-22 ENCOUNTER — Encounter: Payer: Self-pay | Admitting: Emergency Medicine

## 2015-04-22 DIAGNOSIS — Z792 Long term (current) use of antibiotics: Secondary | ICD-10-CM | POA: Insufficient documentation

## 2015-04-22 DIAGNOSIS — I10 Essential (primary) hypertension: Secondary | ICD-10-CM | POA: Insufficient documentation

## 2015-04-22 DIAGNOSIS — F17210 Nicotine dependence, cigarettes, uncomplicated: Secondary | ICD-10-CM | POA: Insufficient documentation

## 2015-04-22 DIAGNOSIS — E119 Type 2 diabetes mellitus without complications: Secondary | ICD-10-CM | POA: Insufficient documentation

## 2015-04-22 DIAGNOSIS — Z7984 Long term (current) use of oral hypoglycemic drugs: Secondary | ICD-10-CM | POA: Insufficient documentation

## 2015-04-22 DIAGNOSIS — Z5189 Encounter for other specified aftercare: Secondary | ICD-10-CM

## 2015-04-22 DIAGNOSIS — Z79899 Other long term (current) drug therapy: Secondary | ICD-10-CM | POA: Insufficient documentation

## 2015-04-22 DIAGNOSIS — Z4801 Encounter for change or removal of surgical wound dressing: Secondary | ICD-10-CM | POA: Insufficient documentation

## 2015-04-22 MED ORDER — HYDROCHLOROTHIAZIDE 12.5 MG PO TABS: 12.5000 mg | ORAL_TABLET | Freq: Every day | ORAL | Status: DC

## 2015-04-22 NOTE — ED Provider Notes (Signed)
[facility] Emergency Department Provider Note  ____________________________________________  Time seen: Approximately 8:55 AM  I have reviewed the triage vital signs and the nursing notes.   HISTORY  Chief Complaint Wound Check   HPI Johnny Kelley is a 50 y.o. male who presents to the emergency department for a wound check. He was here last Wednesday. I and D of left elbow performed. He reports the packing came out during a dressing change, but it has continued to drain and is smaller and less painful. He denies known fever. He denies new complaint. He is also requesting BP medication that is more affordable. He is asymptomatic with current HTN.   Past Medical History  Diagnosis Date  . Diabetes mellitus without complication ([place])   . Hypertension   . Hepatitis C     Patient Active Problem List   Diagnosis Date Noted  . Cellulitis and abscess 02/22/2015  . Hypertension 02/22/2015  . Abscess 02/22/2015  . Abscess of groin, right   . DM w/ coma type II, uncontrolled ([place])     Past Surgical History  Procedure Laterality Date  . Knee surgery Right     Current Outpatient Rx  Name  Route  Sig  Dispense  Refill  . amLODipine (NORVASC) 10 MG tablet   Oral   Take 1 tablet (10 mg total) by mouth daily.   30 tablet   2   . hydrochlorothiazide (HYDRODIURIL) 12.5 MG tablet   Oral   Take 1 tablet (12.5 mg total) by mouth daily.   30 tablet   0   . metFORMIN (GLUCOPHAGE) 850 MG tablet   Oral   Take 1 tablet (850 mg total) by mouth 2 (two) times daily with a meal.   60 tablet   0   . oxyCODONE-acetaminophen (ROXICET) 5-325 MG tablet   Oral   Take 1-2 tablets by mouth every 4 (four) hours as needed for severe pain.   15 tablet   0   . sulfamethoxazole-trimethoprim (BACTRIM DS,SEPTRA DS) 800-160 MG tablet   Oral   Take 1 tablet by mouth 2 (two) times daily.   20 tablet   0     Allergies Review of patient's allergies indicates no  known allergies.  Family History  Problem Relation Age of Onset  . Colon cancer Father   . Diabetes Father     Social History Social History  Substance Use Topics  . Smoking status: Current Every Day Smoker -- 0.50 packs/day    Types: Cigarettes  . Smokeless tobacco: Never Used  . Alcohol Use: Yes     Comment: 4-5 24 oz beer daily    Review of Systems Constitutional: No fever/chills Eyes: No visual changes. No blurred vision. ENT: No sore throat. Cardiovascular: Denies chest pain. Respiratory: Denies shortness of breath. Gastrointestinal: No abdominal pain.  No nausea, no vomiting.  Musculoskeletal: Negative for back pain. Skin: Abscess to left elbow. Neurological: Negative for headaches, focal weakness or numbness. 10-point ROS otherwise unremarkable.  ____________________________________________   PHYSICAL EXAM:  VITAL SIGNS: ED Triage Vitals  Enc Vitals Group     BP 04/22/15 0800 188/107 mmHg     Pulse Rate 04/22/15 0800 79     Resp 04/22/15 0800 18     Temp 04/22/15 0800 97.8 F (36.6 C)     Temp Source 04/22/15 0800 Oral     SpO2 04/22/15 0800 96 %     Weight 04/22/15 0800 210 lb (95.255 kg)  Height 04/22/15 0800 6\' 2"  (1.88 m)     Head Cir --      Peak Flow --      Pain Score 04/22/15 0808 7     Pain Loc --      Pain Edu? --      Excl. in [place]? --     Constitutional: Alert and oriented. Well appearing and in no acute distress. Eyes: Conjunctivae are normal. PERRL. EOMI. Head: Atraumatic. Nose: No congestion/rhinnorhea. Mouth/Throat: Mucous membranes are moist.  Neck: No stridor. Cardiovascular:Good peripheral circulation. Respiratory: Normal respiratory effort.  No retractions.  Musculoskeletal: Full ROM throughout. Neurologic:  Normal speech and language. No gross focal neurologic deficits are appreciated. Speech is normal. No gait instability. Skin: Mildly fluctuant area over the left lateral epicondyle that is draining purulent substance.  Local erythema noted.  Psychiatric: Mood and affect are normal. Speech and behavior are normal.  ____________________________________________   LABS (all labs ordered are listed, but only abnormal results are displayed)  Labs Reviewed - No data to display ____________________________________________  RADIOLOGY  Not indicated ____________________________________________   PROCEDURES  Procedure(s) performed: Not indicated.  ___________________________________________   INITIAL IMPRESSION / ASSESSMENT AND PLAN / ED COURSE  Pertinent labs & imaging results that were available during my care of the patient were reviewed by me and considered in my medical decision making (see chart for details).  Information regarding local community clinics provided and he was advised to call tomorrow for an appiontment. Rx for HCTZ given as patient is hypertensive and diabetic.   Area surrounding the abscess was marked and he was advised to return if the erythema extends outside of the line, drainage stops and pain returns. He was advised to finish his antibiotic as prescribed.  Patient was advised to follow up with the primary care provider for symptoms of concern or return to the emergency department if unable to schedule an appointment. ____________________________________________   FINAL CLINICAL IMPRESSION(S) / ED DIAGNOSES  Final diagnoses:  Wound check, abscess  Essential hypertension       Victorino Dike, FNP 04/22/15 [id number]  Joanne Gavel, MD 04/22/15 [phone]

## 2015-04-22 NOTE — ED Notes (Signed)
Pt comes into the ED via POV c/o wound check from an abscess on his left elbow abscess. Patient presents with some purulent drainage still coming from the wound. Denies any fevers or chills.

## 2016-12-09 ENCOUNTER — Emergency Department
Admission: EM | Admit: 2016-12-09 | Discharge: 2016-12-09 | Disposition: A | Discharge status: Home / Self Care | Payer: Self-pay | Attending: Emergency Medicine | Admitting: Emergency Medicine

## 2016-12-09 DIAGNOSIS — Z7984 Long term (current) use of oral hypoglycemic drugs: Secondary | ICD-10-CM | POA: Insufficient documentation

## 2016-12-09 DIAGNOSIS — E119 Type 2 diabetes mellitus without complications: Secondary | ICD-10-CM | POA: Insufficient documentation

## 2016-12-09 DIAGNOSIS — I10 Essential (primary) hypertension: Secondary | ICD-10-CM | POA: Insufficient documentation

## 2016-12-09 DIAGNOSIS — F17210 Nicotine dependence, cigarettes, uncomplicated: Secondary | ICD-10-CM | POA: Insufficient documentation

## 2016-12-09 DIAGNOSIS — S025XXA Fracture of tooth (traumatic), initial encounter for closed fracture: Secondary | ICD-10-CM | POA: Insufficient documentation

## 2016-12-09 DIAGNOSIS — X58XXXA Exposure to other specified factors, initial encounter: Secondary | ICD-10-CM | POA: Insufficient documentation

## 2016-12-09 DIAGNOSIS — Y939 Activity, unspecified: Secondary | ICD-10-CM | POA: Insufficient documentation

## 2016-12-09 DIAGNOSIS — Y929 Unspecified place or not applicable: Secondary | ICD-10-CM | POA: Insufficient documentation

## 2016-12-09 DIAGNOSIS — Z79899 Other long term (current) drug therapy: Secondary | ICD-10-CM | POA: Insufficient documentation

## 2016-12-09 DIAGNOSIS — Y998 Other external cause status: Secondary | ICD-10-CM | POA: Insufficient documentation

## 2016-12-09 MED ORDER — TRAMADOL HCL 50 MG PO TABS: 50.0000 mg | ORAL_TABLET | Freq: Four times a day (QID) | ORAL | 0 refills | Status: DC | PRN

## 2016-12-09 MED ORDER — AMOXICILLIN 500 MG PO CAPS: 500.0000 mg | ORAL_CAPSULE | Freq: Three times a day (TID) | ORAL | 0 refills | Status: DC

## 2016-12-09 MED ORDER — IBUPROFEN 800 MG PO TABS
800.0000 mg | ORAL_TABLET | Freq: Once | ORAL | Status: AC
  Administered 2016-12-09: 800 mg via ORAL
  Filled 2016-12-09: qty 1

## 2016-12-09 MED ORDER — TRAMADOL HCL 50 MG PO TABS
50.0000 mg | ORAL_TABLET | Freq: Once | ORAL | Status: AC
Start: 2016-12-09 — End: 2016-12-09
  Administered 2016-12-09: 50 mg via ORAL
  Filled 2016-12-09: qty 1

## 2016-12-09 NOTE — ED Triage Notes (Signed)
Pt c/o front lower tooth that broke off and is now having facial pain

## 2016-12-09 NOTE — ED Provider Notes (Signed)
[facility] Emergency Department Provider Note   ____________________________________________   First MD Initiated Contact with Patient 12/09/16 [phone]     (approximate)  I have reviewed the triage vital signs and the nursing notes.   HISTORY  Chief Complaint Dental Pain    HPI Johnny Kelley is a 51 y.o. male patient  with dental pain secondary to fractured tooth 1 month.Patient has a history of devitalized teeth. No palliative measures for complaint. Patient rates pain as a 10 over 10. Patient had a pain as "achy". Patient denies fever or gingival edema.   Past Medical History:  Diagnosis Date  . Diabetes mellitus without complication ([place])   . Hepatitis C   . Hypertension     Patient Active Problem List   Diagnosis Date Noted  . Cellulitis and abscess 02/22/2015  . Hypertension 02/22/2015  . Abscess 02/22/2015  . Abscess of groin, right   . DM w/ coma type II, uncontrolled ([place])     Past Surgical History:  Procedure Laterality Date  . KNEE SURGERY Right     Prior to Admission medications   Medication Sig Start Date End Date Taking? Authorizing Provider  amLODipine (NORVASC) 10 MG tablet Take 1 tablet (10 mg total) by mouth daily. 02/24/15   Fritzi Mandes, MD  amoxicillin (AMOXIL) 500 MG capsule Take 1 capsule (500 mg total) by mouth 3 (three) times daily. 12/09/16   Sable Feil, PA-C  hydrochlorothiazide (HYDRODIURIL) 12.5 MG tablet Take 1 tablet (12.5 mg total) by mouth daily. 04/22/15   Triplett, Johnette Abraham B, FNP  metFORMIN (GLUCOPHAGE) 850 MG tablet Take 1 tablet (850 mg total) by mouth 2 (two) times daily with a meal. 02/24/15   Fritzi Mandes, MD  oxyCODONE-acetaminophen (ROXICET) 5-325 MG tablet Take 1-2 tablets by mouth every 4 (four) hours as needed for severe pain. 04/18/15   Beers, Pierce Crane, PA-C  sulfamethoxazole-trimethoprim (BACTRIM DS,SEPTRA DS) 800-160 MG tablet Take 1 tablet by mouth 2 (two) times daily. 04/18/15   Beers,  Pierce Crane, PA-C  traMADol (ULTRAM) 50 MG tablet Take 1 tablet (50 mg total) by mouth every 6 (six) hours as needed for moderate pain. 12/09/16   Sable Feil, PA-C    Allergies Patient has no known allergies.  Family History  Problem Relation Age of Onset  . Colon cancer Father   . Diabetes Father     Social History Social History  Substance Use Topics  . Smoking status: Current Every Day Smoker    Packs/day: 0.50    Types: Cigarettes  . Smokeless tobacco: Never Used  . Alcohol use Yes     Comment: 4-5 24 oz beer daily    Review of Systems  Constitutional: No fever/chills Eyes: No visual changes. ENT: No sore throat. Cardiovascular: Denies chest pain. Respiratory: Denies shortness of breath. Gastrointestinal: No abdominal pain.  No nausea, no vomiting.  No diarrhea.  No constipation. Genitourinary: Negative for dysuria. Musculoskeletal: Negative for back pain. Skin: Negative for rash. Neurological: Negative for headaches, focal weakness or numbness. Endocrine:Diabetes, hypertension, hepatitis C.   ____________________________________________   PHYSICAL EXAM:  VITAL SIGNS: ED Triage Vitals [12/09/16 0708]  Enc Vitals Group     BP (!) 155/88     Pulse Rate 99     Resp 18     Temp 98.1 F (36.7 C)     Temp Source Oral     SpO2 98 %     Weight 205 lb (93 kg)  Height 6\' 1"  (1.854 m)     Head Circumference      Peak Flow      Pain Score 10     Pain Loc      Pain Edu?      Excl. in [place]?     Constitutional: Alert and oriented. Well appearing and in no acute distress. Mouth/Throat: Mucous membranes are moist.  Oropharynx non-erythematous.Multiple caries and fractured tooth #5 and 6. Neck: No stridor.  No cervical spine tenderness to palpation. Hematological/Lymphatic/Immunilogical: No cervical lymphadenopathy. Cardiovascular: Normal rate, regular rhythm. Grossly normal heart sounds.  Good peripheral circulation. Elevated blood pressure Respiratory:  Normal respiratory effort.  No retractions. Lungs CTAB. Neurologic:  Normal speech and language. No gross focal neurologic deficits are appreciated. No gait instability. Skin:  Skin is warm, dry and intact. No rash noted. Psychiatric: Mood and affect are normal. Speech and behavior are normal.  ____________________________________________   LABS (all labs ordered are listed, but only abnormal results are displayed)  Labs Reviewed - No data to display ____________________________________________  EKG   ____________________________________________  RADIOLOGY  No results found.  ____________________________________________   PROCEDURES  Procedure(s) performed: None  Procedures  Critical Care performed: No  ____________________________________________   INITIAL IMPRESSION / ASSESSMENT AND PLAN / ED COURSE  Pertinent labs & imaging results that were available during my care of the patient were reviewed by me and considered in my medical decision making (see chart for details).  Dental pain secondary to multiple caries and fractured teeth. Patient given discharge care instructions. Patient advised to go to the walk-in dental clinic this morning. Patient last take medication as directed.      ____________________________________________   FINAL CLINICAL IMPRESSION(S) / ED DIAGNOSES  Final diagnoses:  Closed fracture of tooth, initial encounter      NEW MEDICATIONS STARTED DURING THIS VISIT:  Discharge Medication List as of 12/09/2016  7:26 AM    START taking these medications   Details  amoxicillin (AMOXIL) 500 MG capsule Take 1 capsule (500 mg total) by mouth 3 (three) times daily., Starting Tue 12/09/2016, Print    traMADol (ULTRAM) 50 MG tablet Take 1 tablet (50 mg total) by mouth every 6 (six) hours as needed for moderate pain., Starting Tue 12/09/2016, Print         Note:  This document was prepared using Dragon voice recognition software and may  include unintentional dictation errors.    Sable Feil, PA-C 12/09/16 Wonda Amis    Lavonia Drafts, MD 12/09/16 [phone]

## 2017-05-28 ENCOUNTER — Emergency Department
Admission: EM | Admit: 2017-05-28 | Discharge: 2017-05-28 | Disposition: A | Discharge status: Home / Self Care | Payer: Self-pay | Attending: Emergency Medicine | Admitting: Emergency Medicine

## 2017-05-28 ENCOUNTER — Emergency Department: Payer: Self-pay

## 2017-05-28 ENCOUNTER — Encounter: Payer: Self-pay | Admitting: Emergency Medicine

## 2017-05-28 DIAGNOSIS — Z79899 Other long term (current) drug therapy: Secondary | ICD-10-CM | POA: Insufficient documentation

## 2017-05-28 DIAGNOSIS — B9689 Other specified bacterial agents as the cause of diseases classified elsewhere: Secondary | ICD-10-CM

## 2017-05-28 DIAGNOSIS — F17210 Nicotine dependence, cigarettes, uncomplicated: Secondary | ICD-10-CM | POA: Insufficient documentation

## 2017-05-28 DIAGNOSIS — L0889 Other specified local infections of the skin and subcutaneous tissue: Secondary | ICD-10-CM | POA: Insufficient documentation

## 2017-05-28 DIAGNOSIS — M25512 Pain in left shoulder: Secondary | ICD-10-CM | POA: Insufficient documentation

## 2017-05-28 DIAGNOSIS — E119 Type 2 diabetes mellitus without complications: Secondary | ICD-10-CM | POA: Insufficient documentation

## 2017-05-28 DIAGNOSIS — L089 Local infection of the skin and subcutaneous tissue, unspecified: Secondary | ICD-10-CM

## 2017-05-28 DIAGNOSIS — I10 Essential (primary) hypertension: Secondary | ICD-10-CM | POA: Insufficient documentation

## 2017-05-28 DIAGNOSIS — Z7984 Long term (current) use of oral hypoglycemic drugs: Secondary | ICD-10-CM | POA: Insufficient documentation

## 2017-05-28 DIAGNOSIS — G8929 Other chronic pain: Secondary | ICD-10-CM

## 2017-05-28 MED ORDER — HYDROCHLOROTHIAZIDE 12.5 MG PO TABS: 12.5000 mg | ORAL_TABLET | Freq: Every day | ORAL | 2 refills | Status: DC

## 2017-05-28 MED ORDER — SULFAMETHOXAZOLE-TRIMETHOPRIM 800-160 MG PO TABS: 1.0000 | ORAL_TABLET | Freq: Two times a day (BID) | ORAL | 0 refills | Status: DC

## 2017-05-28 MED ORDER — NAPROXEN 500 MG PO TABS: 500.0000 mg | ORAL_TABLET | Freq: Two times a day (BID) | ORAL | 2 refills | Status: AC

## 2017-05-28 MED ORDER — AMLODIPINE BESYLATE 10 MG PO TABS
10.0000 mg | ORAL_TABLET | Freq: Every day | ORAL | 2 refills | Status: DC
Start: 2017-05-28 — End: 2018-09-24

## 2017-05-28 MED ORDER — METFORMIN HCL 850 MG PO TABS: 850.0000 mg | ORAL_TABLET | Freq: Two times a day (BID) | ORAL | 2 refills | Status: DC

## 2017-05-28 NOTE — ED Notes (Signed)
See triage note  Presents with left shoulder pain for about 1 year.  Denies any injury  States pain is increased with movement.  Also has several abscess area ..larger one on abd which is draining this am  Also has one on leg and foot

## 2017-05-28 NOTE — ED Triage Notes (Signed)
Pt arrived via POV with c/o left shoulder pain for several months 10/10 with decreased ROM.  Pt denies injury states it may be rotator cuff or arthritis.  Pt also c/o abscess to abdomen that popped, area is red and inflamed, no drainage noted.    Pt also c/o blister to left great toe, that is not currently draining.   Pt ambulatory in triage.

## 2017-05-28 NOTE — ED Provider Notes (Signed)
[facility] Emergency Department Provider Note   ____________________________________________   First MD Initiated Contact with Patient 05/28/17 [phone]     (approximate)  I have reviewed the triage vital signs and the nursing notes.   HISTORY  Chief Complaint Shoulder Pain (Left) and Abscess    HPI Johnny Kelley is a 52 y.o. male patient complain of left shoulder pain for several months.  Patient states decreased range of motion with abduction overhead reaching.  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  Patient also complained of multiple abscess abdomen and upper and lower extremities.  Patient is diabetic and hypertensive with noncompliance of medications.  Past Medical History:  Diagnosis Date  . Diabetes mellitus without complication ([place])   . Hepatitis C   . Hypertension     Patient Active Problem List   Diagnosis Date Noted  . Cellulitis and abscess 02/22/2015  . Hypertension 02/22/2015  . Abscess 02/22/2015  . Abscess of groin, right   . DM w/ coma type II, uncontrolled ([place])     Past Surgical History:  Procedure Laterality Date  . KNEE SURGERY Right     Prior to Admission medications   Medication Sig Start Date End Date Taking? Authorizing Provider  amLODipine (NORVASC) 10 MG tablet Take 1 tablet (10 mg total) by mouth daily. 05/28/17   Sable Feil, PA-C  amoxicillin (AMOXIL) 500 MG capsule Take 1 capsule (500 mg total) by mouth 3 (three) times daily. 12/09/16   Sable Feil, PA-C  hydrochlorothiazide (HYDRODIURIL) 12.5 MG tablet Take 1 tablet (12.5 mg total) by mouth daily. 05/28/17   Sable Feil, PA-C  metFORMIN (GLUCOPHAGE) 850 MG tablet Take 1 tablet (850 mg total) by mouth 2 (two) times daily with a meal. 05/28/17   Sable Feil, PA-C  naproxen (NAPROSYN) 500 MG tablet Take 1 tablet (500 mg total) by mouth 2 (two) times daily with a meal. 05/28/17 05/28/18  Sable Feil, PA-C   oxyCODONE-acetaminophen (ROXICET) 5-325 MG tablet Take 1-2 tablets by mouth every 4 (four) hours as needed for severe pain. 04/18/15   Beers, Pierce Crane, PA-C  sulfamethoxazole-trimethoprim (BACTRIM DS,SEPTRA DS) 800-160 MG tablet Take 1 tablet by mouth 2 (two) times daily. 05/28/17   Sable Feil, PA-C  traMADol (ULTRAM) 50 MG tablet Take 1 tablet (50 mg total) by mouth every 6 (six) hours as needed for moderate pain. 12/09/16   Sable Feil, PA-C    Allergies Patient has no known allergies.  Family History  Problem Relation Age of Onset  . Colon cancer Father   . Diabetes Father     Social History Social History   Tobacco Use  . Smoking status: Current Every Day Smoker    Packs/day: 1.00    Types: Cigarettes  . Smokeless tobacco: Never Used  Substance Use Topics  . Alcohol use: Yes    Comment: 4-5 24 oz beer daily  . Drug use: Yes    Types: Cocaine, Marijuana    Comment: "occasionally"    Review of Systems Constitutional: No fever/chills Eyes: No visual changes. ENT: No sore throat. Cardiovascular: Denies chest pain. Respiratory: Denies shortness of breath. Gastrointestinal: No abdominal pain.  No nausea, no vomiting.  No diarrhea.  No constipation. Genitourinary: Negative for dysuria. Musculoskeletal: Left shoulder pain. Skin: Positive for rash. Neurological: Negative for headaches, focal weakness or numbness. Endocrine:Diabetes, hypertension tension, and hepatitis C.   ____________________________________________   PHYSICAL EXAM:  VITAL SIGNS: ED Triage Vitals  Enc Vitals Group     BP 05/28/17 0701 (!) 176/87     Pulse Rate 05/28/17 0701 80     Resp 05/28/17 0701 18     Temp 05/28/17 0701 98.7 F (37.1 C)     Temp Source 05/28/17 0701 Oral     SpO2 05/28/17 0701 95 %     Weight 05/28/17 0702 205 lb (93 kg)     Height 05/28/17 0702 6\' 2"  (1.88 m)     Head Circumference --      Peak Flow --      Pain Score 05/28/17 0704 10     Pain Loc --       Pain Edu? --      Excl. in [place]? --    Constitutional: Alert and oriented. Well appearing and in no acute distress. Neck: No stridor.  Hematological/Lymphatic/Immunilogical: No cervical lymphadenopathy. Cardiovascular: Normal rate, regular rhythm. Grossly normal heart sounds.  Good peripheral circulation.  Elevated blood pressure Respiratory: Normal respiratory effort.  No retractions. Lungs CTAB. Gastrointestinal: Soft and nontender. No distention. No abdominal bruits. No CVA tenderness. Musculoskeletal: No obvious deformity to the left upper extremity.  Patient has decreased range of motion with abduction overhead reaching.  Patient has moderate guarding superior aspect of the humerus and is also at the [facility] joint. Neurologic:  Normal speech and language. No gross focal neurologic deficits are appreciated. No gait instability. Skin: Multiple erythematous papular lesions on her upper and lower extremity and abdomen.   Psychiatric: Mood and affect are normal. Speech and behavior are normal.  ____________________________________________   LABS (all labs ordered are listed, but only abnormal results are displayed)  Labs Reviewed - No data to display ____________________________________________  EKG   ____________________________________________  RADIOLOGY  ED MD interpretation: No acute findings on x-ray of the left shoulder. Official radiology report(s): No results found.  ____________________________________________   PROCEDURES  Procedure(s) performed: None  Procedures  Critical Care performed: No  ____________________________________________   INITIAL IMPRESSION / ASSESSMENT AND PLAN / ED COURSE  As part of my medical decision making, I reviewed the following data within the electronic MEDICAL RECORD NUMBER    Multiple erythematous papular lesions trunk and lower lower extremities.  Areas nonfluctuant and discussed with patient rationale for not performing incision and  drainage.  Left shoulder pain secondary to repetitive motion.  Discussed negative x-ray findings with patient.  Discussed with patient rationale to [facility] for his chronic medical conditions consisting of hypertension diabetes.  Patient given discharge care instructions and advised take medication as directed.      ____________________________________________   FINAL CLINICAL IMPRESSION(S) / ED DIAGNOSES  Final diagnoses:  None     ED Discharge Orders        Ordered    sulfamethoxazole-trimethoprim (BACTRIM DS,SEPTRA DS) 800-160 MG tablet  2 times daily     05/28/17 0720    amLODipine (NORVASC) 10 MG tablet  Daily     05/28/17 0720    hydrochlorothiazide (HYDRODIURIL) 12.5 MG tablet  Daily     05/28/17 0720    metFORMIN (GLUCOPHAGE) 850 MG tablet  2 times daily with meals     05/28/17 0720    naproxen (NAPROSYN) 500 MG tablet  2 times daily with meals     05/28/17 0720       Note:  This document was prepared using Dragon voice recognition software and may include unintentional dictation  errors.    Sable Feil, PA-C 05/28/17 0756    Earleen Newport, MD 05/28/17 [phone]

## 2017-05-28 NOTE — Discharge Instructions (Signed)
Advised to restart your hypertension and diabetic medications.  [facility] appointment with [facility] to establish care.

## 2017-10-19 ENCOUNTER — Emergency Department
Admission: EM | Admit: 2017-10-19 | Discharge: 2017-10-19 | Disposition: A | Discharge status: Home / Self Care | Payer: Self-pay | Attending: Emergency Medicine | Admitting: Emergency Medicine

## 2017-10-19 ENCOUNTER — Encounter: Payer: Self-pay | Admitting: Emergency Medicine

## 2017-10-19 DIAGNOSIS — Z79899 Other long term (current) drug therapy: Secondary | ICD-10-CM | POA: Insufficient documentation

## 2017-10-19 DIAGNOSIS — J0100 Acute maxillary sinusitis, unspecified: Secondary | ICD-10-CM | POA: Insufficient documentation

## 2017-10-19 DIAGNOSIS — I10 Essential (primary) hypertension: Secondary | ICD-10-CM | POA: Insufficient documentation

## 2017-10-19 DIAGNOSIS — Z7984 Long term (current) use of oral hypoglycemic drugs: Secondary | ICD-10-CM | POA: Insufficient documentation

## 2017-10-19 DIAGNOSIS — E119 Type 2 diabetes mellitus without complications: Secondary | ICD-10-CM | POA: Insufficient documentation

## 2017-10-19 DIAGNOSIS — F17210 Nicotine dependence, cigarettes, uncomplicated: Secondary | ICD-10-CM | POA: Insufficient documentation

## 2017-10-19 LAB — GLUCOSE, CAPILLARY: Glucose-Capillary: 181 mg/dL — ABNORMAL HIGH (ref 70–99)

## 2017-10-19 MED ORDER — FEXOFENADINE-PSEUDOEPHED ER 60-120 MG PO TB12: 1.0000 | ORAL_TABLET | Freq: Two times a day (BID) | ORAL | 0 refills | Status: DC

## 2017-10-19 MED ORDER — BENZONATATE 100 MG PO CAPS: 100.0000 mg | ORAL_CAPSULE | Freq: Three times a day (TID) | ORAL | 0 refills | Status: DC | PRN

## 2017-10-19 MED ORDER — AMOXICILLIN 500 MG PO CAPS: 500.0000 mg | ORAL_CAPSULE | Freq: Three times a day (TID) | ORAL | 0 refills | Status: DC

## 2017-10-19 NOTE — ED Provider Notes (Signed)
[facility] Emergency Department Provider Note   ____________________________________________   First MD Initiated Contact with Patient 10/19/17 [phone]     (approximate)  I have reviewed the triage vital signs and the nursing notes.   HISTORY  Chief Complaint Nasal Congestion and Facial Pain    HPI Johnny Kelley is a 52 y.o. male patient presents with 2 weeks of sinus congestion and right facial pain.  Patient also complained of frontal headache.  Patient also complained of right ear pressure.  Patient relates that on his last visit he was prescribed metformin but he said he cannot take this medication secondary to upset stomach for each dosage.  Patient has not [facility] as [facility].  Patient rates his pain/discomfort as 10/10.  Patient describes his pain/discomfort as "pressure".  No palliative measure for complaint.  Past Medical History:  Diagnosis Date  . Diabetes mellitus without complication ([place])   . Hepatitis C   . Hypertension     Patient Active Problem List   Diagnosis Date Noted  . Cellulitis and abscess 02/22/2015  . Hypertension 02/22/2015  . Abscess 02/22/2015  . Abscess of groin, right   . DM w/ coma type II, uncontrolled ([place])     Past Surgical History:  Procedure Laterality Date  . KNEE SURGERY Right     Prior to Admission medications   Medication Sig Start Date End Date Taking? Authorizing Provider  amLODipine (NORVASC) 10 MG tablet Take 1 tablet (10 mg total) by mouth daily. 05/28/17   Sable Feil, PA-C  amoxicillin (AMOXIL) 500 MG capsule Take 1 capsule (500 mg total) by mouth 3 (three) times daily. 12/09/16   Sable Feil, PA-C  amoxicillin (AMOXIL) 500 MG capsule Take 1 capsule (500 mg total) by mouth 3 (three) times daily. 10/19/17   Sable Feil, PA-C  benzonatate (TESSALON PERLES) 100 MG capsule Take 1 capsule (100 mg total) by mouth 3 (three) times daily as needed for cough. 10/19/17    Sable Feil, PA-C  fexofenadine-pseudoephedrine (ALLEGRA-D) 60-120 MG 12 hr tablet Take 1 tablet by mouth 2 (two) times daily. 10/19/17   Sable Feil, PA-C  hydrochlorothiazide (HYDRODIURIL) 12.5 MG tablet Take 1 tablet (12.5 mg total) by mouth daily. 05/28/17   Sable Feil, PA-C  metFORMIN (GLUCOPHAGE) 850 MG tablet Take 1 tablet (850 mg total) by mouth 2 (two) times daily with a meal. 05/28/17   Sable Feil, PA-C  naproxen (NAPROSYN) 500 MG tablet Take 1 tablet (500 mg total) by mouth 2 (two) times daily with a meal. 05/28/17 05/28/18  Sable Feil, PA-C  oxyCODONE-acetaminophen (ROXICET) 5-325 MG tablet Take 1-2 tablets by mouth every 4 (four) hours as needed for severe pain. 04/18/15   Beers, Pierce Crane, PA-C  sulfamethoxazole-trimethoprim (BACTRIM DS,SEPTRA DS) 800-160 MG tablet Take 1 tablet by mouth 2 (two) times daily. 05/28/17   Sable Feil, PA-C  traMADol (ULTRAM) 50 MG tablet Take 1 tablet (50 mg total) by mouth every 6 (six) hours as needed for moderate pain. 12/09/16   Sable Feil, PA-C    Allergies Patient has no known allergies.  Family History  Problem Relation Age of Onset  . Colon cancer Father   . Diabetes Father     Social History Social History   Tobacco Use  . Smoking status: Current Every Day Smoker    Packs/day: 1.00    Types: Cigarettes  . Smokeless tobacco: Never Used  Substance  Use Topics  . Alcohol use: Yes    Comment: 4-5 24 oz beer daily  . Drug use: Yes    Types: Cocaine, Marijuana    Comment: "occasionally"    Review of Systems  Constitutional: No fever/chills Eyes: No visual changes. ENT: No sore throat. Cardiovascular: Denies chest pain. Respiratory: Denies shortness of breath. Gastrointestinal: No abdominal pain.  No nausea, no vomiting.  No diarrhea.  No constipation. Genitourinary: Negative for dysuria. Musculoskeletal: Negative for back pain. Skin: Negative for rash. Neurological: Negative for headaches, focal  weakness or numbness. Endocrine:Diabetes, hepatitis C, and hypertension.  ____________________________________________   PHYSICAL EXAM:  VITAL SIGNS: ED Triage Vitals  Enc Vitals Group     BP 10/19/17 0659 (!) 165/84     Pulse Rate 10/19/17 0659 78     Resp 10/19/17 0659 18     Temp 10/19/17 0659 98.6 F (37 C)     Temp Source 10/19/17 0659 Oral     SpO2 10/19/17 0659 94 %     Weight 10/19/17 0656 208 lb (94.3 kg)     Height 10/19/17 0656 6\' 1"  (1.854 m)     Head Circumference --      Peak Flow --      Pain Score 10/19/17 0656 10     Pain Loc --      Pain Edu? --      Excl. in [place]? --    Constitutional: Alert and oriented. Well appearing and in no acute distress. Nose: Edematous nasal turbinates.  Bilateral maxillary guarding. Mouth/Throat: Mucous membranes are moist.  Oropharynx non-erythematous.  Postnasal drainage. Neck: No stridor. Hematological/Lymphatic/Immunilogical: No cervical lymphadenopathy. Cardiovascular: Normal rate, regular rhythm. Grossly normal heart sounds.  Good peripheral circulation.  Elevated blood pressure.  Has not taken medication this morning. Respiratory: Normal respiratory effort.  No retractions. Lungs CTAB. Gastrointestinal: Soft and nontender. No distention. No abdominal bruits. No CVA tenderness. Neurologic:  Normal speech and language. No gross focal neurologic deficits are appreciated. No gait instability. Skin:  Skin is warm, dry and intact. No rash noted. Psychiatric: Mood and affect are normal. Speech and behavior are normal.  ____________________________________________   LABS (all labs ordered are listed, but only abnormal results are displayed)  Labs Reviewed  GLUCOSE, CAPILLARY - Abnormal; Notable for the following components:      Result Value   Glucose-Capillary 181 (*)    All other components within normal limits  CBG MONITORING, ED    ____________________________________________  EKG   ____________________________________________  RADIOLOGY  ED MD interpretation:    Official radiology report(s): No results found.  ____________________________________________   PROCEDURES  Procedure(s) performed: None  Procedures  Critical Care performed: No  ____________________________________________   INITIAL IMPRESSION / ASSESSMENT AND PLAN / ED COURSE  As part of my medical decision making, I reviewed the following data within the electronic MEDICAL RECORD NUMBER    Nasal congestion for 2 weeks secondary sinusitis.  Diabetes and hypertension secondary to noncompliance.  Patient given discharge care instruction advised to [facility].      ____________________________________________   FINAL CLINICAL IMPRESSION(S) / ED DIAGNOSES  Final diagnoses:  Acute maxillary sinusitis, recurrence not specified     ED Discharge Orders        Ordered    amoxicillin (AMOXIL) 500 MG capsule  3 times daily     10/19/17 0713    fexofenadine-pseudoephedrine (ALLEGRA-D) 60-120 MG 12 hr tablet  2 times daily  10/19/17 0713    benzonatate (TESSALON PERLES) 100 MG capsule  3 times daily PRN     10/19/17 2179       Note:  This document was prepared using Dragon voice recognition software and may include unintentional dictation errors.    Sable Feil, PA-C 10/19/17 0715    Eula Listen, MD 11/18/17 1438

## 2017-10-19 NOTE — Discharge Instructions (Signed)
Advised to [facility] with [facility] to [facility] care for hypertension and diabetes.  Denied take diabetic medicine on empty stomach.

## 2017-10-19 NOTE — ED Triage Notes (Signed)
Patient with complaint of sinus congestion with right side facial pain times two weeks.

## 2017-10-19 NOTE — ED Notes (Signed)
CBG result of 181, ED provider made aware

## 2018-09-24 ENCOUNTER — Inpatient Hospital Stay: Payer: Self-pay

## 2018-09-24 ENCOUNTER — Other Ambulatory Visit: Payer: Self-pay | Admitting: Cardiology

## 2018-09-24 ENCOUNTER — Inpatient Hospital Stay (HOSPITAL_COMMUNITY)
Admit: 2018-09-24 | Discharge: 2018-09-24 | Disposition: A | Discharge status: Home / Self Care | Payer: Self-pay | Attending: Family Medicine | Admitting: Family Medicine

## 2018-09-24 ENCOUNTER — Other Ambulatory Visit: Payer: Self-pay

## 2018-09-24 ENCOUNTER — Emergency Department: Payer: Self-pay

## 2018-09-24 ENCOUNTER — Inpatient Hospital Stay
Admission: EM | Admit: 2018-09-24 | Discharge: 2018-09-25 | DRG: 065 | Disposition: A | Discharge status: Home / Self Care | Payer: Self-pay | Attending: Internal Medicine | Admitting: Internal Medicine

## 2018-09-24 ENCOUNTER — Encounter: Payer: Self-pay | Admitting: Emergency Medicine

## 2018-09-24 DIAGNOSIS — Z8619 Personal history of other infectious and parasitic diseases: Secondary | ICD-10-CM

## 2018-09-24 DIAGNOSIS — I726 Aneurysm of vertebral artery: Secondary | ICD-10-CM | POA: Diagnosis present

## 2018-09-24 DIAGNOSIS — R29701 NIHSS score 1: Secondary | ICD-10-CM | POA: Diagnosis present

## 2018-09-24 DIAGNOSIS — E119 Type 2 diabetes mellitus without complications: Secondary | ICD-10-CM | POA: Diagnosis present

## 2018-09-24 DIAGNOSIS — I639 Cerebral infarction, unspecified: Secondary | ICD-10-CM | POA: Diagnosis present

## 2018-09-24 DIAGNOSIS — Z833 Family history of diabetes mellitus: Secondary | ICD-10-CM

## 2018-09-24 DIAGNOSIS — F17210 Nicotine dependence, cigarettes, uncomplicated: Secondary | ICD-10-CM | POA: Diagnosis present

## 2018-09-24 DIAGNOSIS — I63512 Cerebral infarction due to unspecified occlusion or stenosis of left middle cerebral artery: Principal | ICD-10-CM | POA: Diagnosis present

## 2018-09-24 DIAGNOSIS — I10 Essential (primary) hypertension: Secondary | ICD-10-CM | POA: Diagnosis present

## 2018-09-24 DIAGNOSIS — G8191 Hemiplegia, unspecified affecting right dominant side: Secondary | ICD-10-CM | POA: Diagnosis present

## 2018-09-24 DIAGNOSIS — Z23 Encounter for immunization: Secondary | ICD-10-CM

## 2018-09-24 DIAGNOSIS — Z8673 Personal history of transient ischemic attack (TIA), and cerebral infarction without residual deficits: Secondary | ICD-10-CM | POA: Diagnosis present

## 2018-09-24 DIAGNOSIS — Z7984 Long term (current) use of oral hypoglycemic drugs: Secondary | ICD-10-CM

## 2018-09-24 DIAGNOSIS — Z1159 Encounter for screening for other viral diseases: Secondary | ICD-10-CM

## 2018-09-24 LAB — URINE DRUG SCREEN, QUALITATIVE (ARMC ONLY)
Amphetamines, Ur Screen: NOT DETECTED
Barbiturates, Ur Screen: NOT DETECTED
Benzodiazepine, Ur Scrn: NOT DETECTED
Cannabinoid 50 Ng, Ur ~~LOC~~: NOT DETECTED
Cocaine Metabolite,Ur ~~LOC~~: POSITIVE — AB
MDMA (Ecstasy)Ur Screen: NOT DETECTED
Methadone Scn, Ur: NOT DETECTED
Opiate, Ur Screen: NOT DETECTED
Phencyclidine (PCP) Ur S: NOT DETECTED
Tricyclic, Ur Screen: NOT DETECTED

## 2018-09-24 LAB — COMPREHENSIVE METABOLIC PANEL
ALT: 43 U/L (ref 0–44)
AST: 57 U/L — ABNORMAL HIGH (ref 15–41)
Albumin: 3.4 g/dL — ABNORMAL LOW (ref 3.5–5.0)
Alkaline Phosphatase: 124 U/L (ref 38–126)
Anion gap: 9 (ref 5–15)
BUN: 12 mg/dL (ref 6–20)
CO2: 24 mmol/L (ref 22–32)
Calcium: 9.2 mg/dL (ref 8.9–10.3)
Chloride: 102 mmol/L (ref 98–111)
Creatinine, Ser: 0.67 mg/dL (ref 0.61–1.24)
GFR calc Af Amer: >60 mL/min (ref >60)
GFR calc non Af Amer: >60 mL/min (ref >60)
Glucose, Bld: 163 mg/dL — ABNORMAL HIGH (ref 70–99)
Potassium: 3.9 mmol/L (ref 3.5–5.1)
Sodium: 135 mmol/L (ref 135–145)
Total Bilirubin: 1.7 mg/dL — ABNORMAL HIGH (ref 0.3–1.2)
Total Protein: 7.6 g/dL (ref 6.5–8.1)

## 2018-09-24 LAB — CBC
HCT: 46.4 % (ref 39.0–52.0)
Hemoglobin: 16.1 g/dL (ref 13.0–17.0)
MCH: 33.7 pg (ref 26.0–34.0)
MCHC: 34.7 g/dL (ref 30.0–36.0)
MCV: 97.1 fL (ref 80.0–100.0)
Platelets: 95 10*3/uL — ABNORMAL LOW (ref 150–400)
RBC: 4.78 MIL/uL (ref 4.22–5.81)
RDW: 12.9 % (ref 11.5–15.5)
WBC: 5.4 10*3/uL (ref 4.0–10.5)
nRBC: 0 % (ref 0.0–0.2)

## 2018-09-24 LAB — URINALYSIS, COMPLETE (UACMP) WITH MICROSCOPIC
Bacteria, UA: NONE SEEN
Bilirubin Urine: NEGATIVE
Glucose, UA: 50 mg/dL — AB
Hgb urine dipstick: NEGATIVE
Ketones, ur: NEGATIVE mg/dL
Leukocytes,Ua: NEGATIVE
Nitrite: NEGATIVE
Protein, ur: NEGATIVE mg/dL
Specific Gravity, Urine: 1.011 (ref 1.005–1.030)
pH: 7 (ref 5.0–8.0)

## 2018-09-24 LAB — GLUCOSE, CAPILLARY
Glucose-Capillary: 196 mg/dL — ABNORMAL HIGH (ref 70–99)
Glucose-Capillary: 180 mg/dL — ABNORMAL HIGH (ref 70–99)
Glucose-Capillary: 206 mg/dL — ABNORMAL HIGH (ref 70–99)

## 2018-09-24 LAB — SARS CORONAVIRUS 2 BY RT PCR (HOSPITAL ORDER, PERFORMED IN ~~LOC~~ HOSPITAL LAB): SARS Coronavirus 2: NEGATIVE

## 2018-09-24 LAB — ECHOCARDIOGRAM COMPLETE
Height: 74 in
Weight: 3360 oz

## 2018-09-24 MED ORDER — HYDRALAZINE HCL 20 MG/ML IJ SOLN
10.0000 mg | INTRAMUSCULAR | Status: DC | PRN
  Administered 2018-09-24 (×2): 10 mg via INTRAVENOUS
  Filled 2018-09-24 (×2): qty 1

## 2018-09-24 MED ORDER — PERFLUTREN LIPID MICROSPHERE
1.0000 mL | INTRAVENOUS | Status: AC | PRN
  Administered 2018-09-24: 2 mL via INTRAVENOUS
  Filled 2018-09-24: qty 10

## 2018-09-24 MED ORDER — ACETAMINOPHEN 650 MG RE SUPP: 650.0000 mg | RECTAL | Status: DC | PRN

## 2018-09-24 MED ORDER — ZOLPIDEM TARTRATE 5 MG PO TABS
5.0000 mg | ORAL_TABLET | Freq: Every evening | ORAL | Status: DC | PRN
  Administered 2018-09-24: 21:00:00 5 mg via ORAL
  Filled 2018-09-24: qty 1

## 2018-09-24 MED ORDER — PNEUMOCOCCAL VAC POLYVALENT 25 MCG/0.5ML IJ INJ
0.5000 mL | INJECTION | INTRAMUSCULAR | Status: AC
  Administered 2018-09-25: 08:00:00 0.5 mL via INTRAMUSCULAR
  Filled 2018-09-24: qty 0.5

## 2018-09-24 MED ORDER — INSULIN ASPART 100 UNIT/ML ~~LOC~~ SOLN
0.0000 [IU] | Freq: Three times a day (TID) | SUBCUTANEOUS | Status: DC
  Administered 2018-09-24 – 2018-09-25 (×3): 3 [IU] via SUBCUTANEOUS
  Filled 2018-09-24 (×3): qty 1

## 2018-09-24 MED ORDER — ENOXAPARIN SODIUM 40 MG/0.4ML ~~LOC~~ SOLN
40.0000 mg | SUBCUTANEOUS | Status: DC
  Administered 2018-09-24: 21:00:00 40 mg via SUBCUTANEOUS
  Filled 2018-09-24: qty 0.4

## 2018-09-24 MED ORDER — NICOTINE 14 MG/24HR TD PT24
14.0000 mg | MEDICATED_PATCH | Freq: Every day | TRANSDERMAL | Status: DC
  Administered 2018-09-24 – 2018-09-25 (×2): 14 mg via TRANSDERMAL
  Filled 2018-09-24 (×2): qty 1

## 2018-09-24 MED ORDER — ASPIRIN 300 MG RE SUPP: 300.0000 mg | Freq: Every day | RECTAL | Status: DC

## 2018-09-24 MED ORDER — ACETAMINOPHEN 325 MG PO TABS: 650.0000 mg | ORAL_TABLET | ORAL | Status: DC | PRN

## 2018-09-24 MED ORDER — IOHEXOL 350 MG/ML SOLN
75.0000 mL | Freq: Once | INTRAVENOUS | Status: AC | PRN
  Administered 2018-09-24: 75 mL via INTRAVENOUS

## 2018-09-24 MED ORDER — ACETAMINOPHEN 160 MG/5ML PO SOLN
650.0000 mg | ORAL | Status: DC | PRN
  Filled 2018-09-24: qty 20.3

## 2018-09-24 MED ORDER — STROKE: EARLY STAGES OF RECOVERY BOOK
Freq: Once | Status: AC
  Administered 2018-09-24: 13:00:00

## 2018-09-24 MED ORDER — ASPIRIN 325 MG PO TABS
325.0000 mg | ORAL_TABLET | Freq: Every day | ORAL | Status: DC
  Administered 2018-09-24 – 2018-09-25 (×2): 325 mg via ORAL
  Filled 2018-09-24 (×3): qty 1

## 2018-09-24 MED ORDER — ATORVASTATIN CALCIUM 20 MG PO TABS
40.0000 mg | ORAL_TABLET | Freq: Every day | ORAL | Status: DC
  Administered 2018-09-24: 40 mg via ORAL
  Filled 2018-09-24: qty 2

## 2018-09-24 MED ORDER — INSULIN ASPART 100 UNIT/ML ~~LOC~~ SOLN
0.0000 [IU] | Freq: Every day | SUBCUTANEOUS | Status: DC
  Administered 2018-09-24: 21:00:00 2 [IU] via SUBCUTANEOUS
  Filled 2018-09-24 (×2): qty 1

## 2018-09-24 NOTE — ED Provider Notes (Signed)
[facility] Emergency Department Provider Note  Time seen: 8:19 AM  I have reviewed the triage vital signs and the nursing notes.   HISTORY  Chief Complaint Gait Problem   HPI Johnny Kelley is a 53 y.o. male with a past medical history of diabetes, hepatitis, hypertension, presents to the emergency department for right arm weakness.  According to the patient for the past several months he has been getting intermittently dizzy, states he was never evaluated for this.  However the past 3 days he has noticed weakness in his right hand and difficulty controlling his right hand.  Patient states he was holding a cigarette the other day in his right hand and he dropped it without even realizing that he had dropped it.  Patient denies any fever cough congestion or shortness of breath.  No chest pain or abdominal pain.  No history of stroke in the past.   Past Medical History:  Diagnosis Date  . Diabetes mellitus without complication ([place])   . Hepatitis C   . Hypertension     Patient Active Problem List   Diagnosis Date Noted  . Cellulitis and abscess 02/22/2015  . Hypertension 02/22/2015  . Abscess 02/22/2015  . Abscess of groin, right   . DM w/ coma type II, uncontrolled ([place])     Past Surgical History:  Procedure Laterality Date  . KNEE SURGERY Right     Prior to Admission medications   Medication Sig Start Date End Date Taking? Authorizing Provider  amLODipine (NORVASC) 10 MG tablet Take 1 tablet (10 mg total) by mouth daily. 05/28/17   Sable Feil, PA-C  amoxicillin (AMOXIL) 500 MG capsule Take 1 capsule (500 mg total) by mouth 3 (three) times daily. 12/09/16   Sable Feil, PA-C  amoxicillin (AMOXIL) 500 MG capsule Take 1 capsule (500 mg total) by mouth 3 (three) times daily. 10/19/17   Sable Feil, PA-C  benzonatate (TESSALON PERLES) 100 MG capsule Take 1 capsule (100 mg total) by mouth 3 (three) times daily as needed for cough. 10/19/17    Sable Feil, PA-C  fexofenadine-pseudoephedrine (ALLEGRA-D) 60-120 MG 12 hr tablet Take 1 tablet by mouth 2 (two) times daily. 10/19/17   Sable Feil, PA-C  hydrochlorothiazide (HYDRODIURIL) 12.5 MG tablet Take 1 tablet (12.5 mg total) by mouth daily. 05/28/17   Sable Feil, PA-C  metFORMIN (GLUCOPHAGE) 850 MG tablet Take 1 tablet (850 mg total) by mouth 2 (two) times daily with a meal. 05/28/17   Sable Feil, PA-C  oxyCODONE-acetaminophen (ROXICET) 5-325 MG tablet Take 1-2 tablets by mouth every 4 (four) hours as needed for severe pain. 04/18/15   Beers, Pierce Crane, PA-C  sulfamethoxazole-trimethoprim (BACTRIM DS,SEPTRA DS) 800-160 MG tablet Take 1 tablet by mouth 2 (two) times daily. 05/28/17   Sable Feil, PA-C  traMADol (ULTRAM) 50 MG tablet Take 1 tablet (50 mg total) by mouth every 6 (six) hours as needed for moderate pain. 12/09/16   Sable Feil, PA-C    No Known Allergies  Family History  Problem Relation Age of Onset  . Colon cancer Father   . Diabetes Father     Social History Social History   Tobacco Use  . Smoking status: Current Every Day Smoker    Packs/day: 1.00    Types: Cigarettes  . Smokeless tobacco: Never Used  Substance Use Topics  . Alcohol use: Yes    Comment: 4-5 24 oz beer daily  . Drug  use: Yes    Types: Cocaine, Marijuana    Comment: "occasionally"    Review of Systems Constitutional: Negative for fever Cardiovascular: Negative for chest pain. Respiratory: Negative for shortness of breath. Gastrointestinal: Negative for abdominal pain, vomiting Musculoskeletal: Negative for musculoskeletal complaints Skin: Negative for skin complaints  Neurological: Negative for headache.  Right hand/arm weakness. All other ROS negative  ____________________________________________   PHYSICAL EXAM:  VITAL SIGNS: ED Triage Vitals  Enc Vitals Group     BP 09/24/18 0731 (!) 177/98     Pulse Rate 09/24/18 0731 76     Resp 09/24/18 0731 16      Temp 09/24/18 0731 98.1 F (36.7 C)     Temp Source 09/24/18 0731 Oral     SpO2 09/24/18 0731 96 %     Weight 09/24/18 0728 210 lb (95.3 kg)     Height 09/24/18 0728 6\' 2"  (1.88 m)     Head Circumference --      Peak Flow --      Pain Score 09/24/18 0728 0     Pain Loc --      Pain Edu? --      Excl. in [place]? --     Constitutional: Alert and oriented. Well appearing and in no distress. Eyes: Normal exam ENT      Head: Normocephalic and atraumatic.      Mouth/Throat: Mucous membranes are moist. Cardiovascular: Normal rate, regular rhythm. Respiratory: Normal respiratory effort without tachypnea nor retractions. Breath sounds are clear  Gastrointestinal: Soft and nontender. No distention. Musculoskeletal: Nontender with normal range of motion in all extremities Neurologic:  Normal speech and language.  Patient has a slightly diminished grip strength in the right upper extremity with right upper extremity pronator drift.  Sensation is intact and equal.  No lower extremity drift, 5/5 motor in lower extremities, cranial nerves intact. Skin:  Skin is warm, dry and intact.  Psychiatric: Mood and affect are normal.  ____________________________________________    EKG  EKG viewed and interpreted by myself shows a normal sinus rhythm at 74 bpm with a narrow QRS, normal axis, normal intervals, no concerning ST changes.  ____________________________________________    RADIOLOGY  MRI of the brain shows 2 acute infarcts.  ____________________________________________   INITIAL IMPRESSION / ASSESSMENT AND PLAN / ED COURSE  Pertinent labs & imaging results that were available during my care of the patient were reviewed by me and considered in my medical decision making (see chart for details).   Presents emergency department for right upper extremity weakness over the past 3 days.  Differential would include neuropathy, CVA, neuropraxia.  Physical exam is very consistent with CVA given  right upper extremity pronator drift and diminished grip strength.  Sensation is intact and equal.  Cranial nerves are intact, lower extremity strength is intact.  We will check labs, we will proceed with MRI of the brain as a patient is well outside of any TPA window and I believe MR would be more beneficial than CT at this time.  Patient agreeable to plan of care.  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  We will admit to the hospital service for continued work-up and treatment.  Lab work is largely nonrevealing thrombocytopenia appears chronic.    NIH Stroke Scale   Interval: Baseline Time: 8:22 AM Person Administering Scale: Harvest Dark  Administer stroke scale items in the order listed. Record performance in each category after each subscale exam. Do not  go back and change scores. Follow directions provided for each exam technique. Scores should reflect what the patient does, not what the clinician thinks the patient can do. The clinician should record answers while administering the exam and work quickly. Except where indicated, the patient should not be coached (i.e., repeated requests to patient to make a special effort).   1a  Level of consciousness: 0=alert; keenly responsive  1b. LOC questions:  0=Performs both tasks correctly  1c. LOC commands: 0=Performs both tasks correctly  2.  Best Gaze: 0=normal  3.  Visual: 0=No visual loss  4. Facial Palsy: 0=Normal symmetric movement  5a.  Motor left arm: 0=No drift, limb holds 90 (or 45) degrees for full 10 seconds  5b.  Motor right arm: 1=Drift, limb holds 90 (or 45) degrees but drifts down before full 10 seconds: does not hit bed  6a. motor left leg: 0=No drift, limb holds 90 (or 45) degrees for full 10 seconds  6b  Motor right leg:  0=No drift, limb holds 90 (or 45) degrees for full 10 seconds  7. Limb Ataxia: 1=Present in one limb  8.  Sensory: 0=Normal; no sensory loss  9. Best Language:  0=No  aphasia, normal  10. Dysarthria: 0=Normal  11. Extinction and Inattention: 0=No abnormality  12. Distal motor function: 0=Normal   Total:   2    RISHON THILGES was evaluated in Emergency Department on 09/24/2018 for the symptoms described in the history of present illness. He was evaluated in the context of the global COVID-19 pandemic, which necessitated consideration that the patient might be at risk for infection with the SARS-CoV-2 virus that causes COVID-19. Institutional protocols and algorithms that pertain to the evaluation of patients at risk for COVID-19 are in a state of rapid change based on information released by regulatory bodies including the CDC and federal and state organizations. These policies and algorithms were followed during the patient's care in the ED.  ____________________________________________   FINAL CLINICAL IMPRESSION(S) / ED DIAGNOSES  CVA   Harvest Dark, MD 09/24/18 1052

## 2018-09-24 NOTE — ED Notes (Signed)
Pt requested something to eat. Dr Irish Elders at bedside and approved [place] sandwich tray.

## 2018-09-24 NOTE — ED Notes (Signed)
First Nurse Note: Pt to ED stating that he thinks he had a small stroke a few days ago. Pt is in NAD

## 2018-09-24 NOTE — ED Notes (Signed)
Pt returned from mri

## 2018-09-24 NOTE — Consult Note (Addendum)
Reason for Consult:RUE weakness  Referring Physician: Dr. Jerelyn Charles   CC: RUE weakness   HPI: Johnny Kelley is an 53 y.o. male with a past medical history of diabetes, hepatitis, hypertension, presents to the emergency department for right arm weakness.  According to the patient for the past several months he has been getting intermittently dizzy, states he was never evaluated for this.  However the past 3 days he has noticed weakness in his right hand and difficulty controlling his right hand. On examination patient has weakness in the RUE grip strength.Pt has 2 small strokes on the L MCA territory and older subacute on R side.    Past Medical History:  Diagnosis Date  . Diabetes mellitus without complication ([place])   . Hepatitis C   . Hypertension     Past Surgical History:  Procedure Laterality Date  . KNEE SURGERY Right     Family History  Problem Relation Age of Onset  . Colon cancer Father   . Diabetes Father     Social History:  reports that he has been smoking cigarettes. He has been smoking about 1.00 pack per day. He has never used smokeless tobacco. He reports current alcohol use. He reports current drug use. Drugs: Cocaine and Marijuana.  No Known Allergies  Medications: I have reviewed the patient's current medications.  ROS: History obtained from the patient  General ROS: negative for - chills, fatigue, fever, night sweats, weight gain or weight loss Psychological ROS: negative for - behavioral disorder, hallucinations, memory difficulties, mood swings or suicidal ideation Ophthalmic ROS: negative for - blurry vision, double vision, eye pain or loss of vision ENT ROS: negative for - epistaxis, nasal discharge, oral lesions, sore throat, tinnitus or vertigo Allergy and Immunology ROS: negative for - hives or itchy/watery eyes Hematological and Lymphatic ROS: negative for - bleeding problems, bruising or swollen lymph nodes Endocrine ROS: negative for -  galactorrhea, hair pattern changes, polydipsia/polyuria or temperature intolerance Respiratory ROS: negative for - cough, hemoptysis, shortness of breath or wheezing Cardiovascular ROS: negative for - chest pain, dyspnea on exertion, edema or irregular heartbeat Gastrointestinal ROS: negative for - abdominal pain, diarrhea, hematemesis, nausea/vomiting or stool incontinence Genito-Urinary ROS: negative for - dysuria, hematuria, incontinence or urinary frequency/urgency Musculoskeletal ROS: negative for - joint swelling or muscular weakness Neurological ROS: as noted in HPI Dermatological ROS: negative for rash and skin lesion changes  Physical Examination: Blood pressure (!) 177/110, pulse 82, temperature 98.1 F (36.7 C), temperature source Oral, resp. rate 16, height 6\' 2"  (1.88 m), weight 95.3 kg, SpO2 96 %.    Neurological Examination   Mental Status: Alert, oriented, thought content appropriate.  Speech fluent without evidence of aphasia.  Able to follow 3 step commands without difficulty. Cranial Nerves: II: Discs flat bilaterally; Visual fields grossly normal, pupils equal, round, reactive to light and accommodation III,IV, VI: ptosis not present, extra-ocular motions intact bilaterally V,VII: smile symmetric, facial light touch sensation normal bilaterally VIII: hearing normal bilaterally IX,X: gag reflex present XI: bilateral shoulder shrug XII: midline tongue extension Motor: Right : Upper extremity   5/5 Promximally and 4/5 grip strenth    Left:     Upper extremity   5/5  Lower extremity   5/5     Lower extremity   5/5 Tone and bulk:normal tone throughout; no atrophy noted Sensory: Pinprick and light touch intact throughout, bilaterally Deep Tendon Reflexes: 1+ and symmetric throughout Plantars: Right: downgoing   Left: downgoing Cerebellar: normal finger-to-nose, normal  rapid alternating movements and normal heel-to-shin test Gait: not tested      Laboratory  Studies:   Basic Metabolic Panel: Recent Labs  Lab 09/24/18 0748  NA 135  K 3.9  CL 102  CO2 24  GLUCOSE 163*  BUN 12  CREATININE 0.67  CALCIUM 9.2    Liver Function Tests: Recent Labs  Lab 09/24/18 0748  AST 57*  ALT 43  ALKPHOS 124  BILITOT 1.7*  PROT 7.6  ALBUMIN 3.4*   No results for input(s): LIPASE, AMYLASE in the last 168 hours. No results for input(s): AMMONIA in the last 168 hours.  CBC: Recent Labs  Lab 09/24/18 0748  WBC 5.4  HGB 16.1  HCT 46.4  MCV 97.1  PLT 95*    Cardiac Enzymes: No results for input(s): CKTOTAL, CKMB, CKMBINDEX, TROPONINI in the last 168 hours.  BNP: Invalid input(s): POCBNP  CBG: No results for input(s): GLUCAP in the last 168 hours.  Microbiology: Results for orders placed or performed during the hospital encounter of 09/24/18  SARS Coronavirus 2 (CEPHEID - Performed in [facility] hospital lab), Hosp Order     Status: None   Collection Time: 09/24/18  8:29 AM   Specimen: Nasopharyngeal Swab  Result Value Ref Range Status   SARS Coronavirus 2 NEGATIVE NEGATIVE Final    Comment: (NOTE) If result is NEGATIVE SARS-CoV-2 target nucleic acids are NOT DETECTED. The SARS-CoV-2 RNA is generally detectable in upper and lower  respiratory specimens during the acute phase of infection. The lowest  concentration of SARS-CoV-2 viral copies this assay can detect is 250  copies / mL. A negative result does not preclude SARS-CoV-2 infection  and should not be used as the sole basis for treatment or other  patient management decisions.  A negative result may occur with  improper specimen collection / handling, submission of specimen other  than nasopharyngeal swab, presence of viral mutation(s) within the  areas targeted by this assay, and inadequate number of viral copies  (<250 copies / mL). A negative result must be combined with clinical  observations, patient history, and epidemiological information. If result is  POSITIVE SARS-CoV-2 target nucleic acids are DETECTED. The SARS-CoV-2 RNA is generally detectable in upper and lower  respiratory specimens dur ing the acute phase of infection.  Positive  results are indicative of active infection with SARS-CoV-2.  Clinical  correlation with patient history and other diagnostic information is  necessary to determine patient infection status.  Positive results do  not rule out bacterial infection or co-infection with other viruses. If result is PRESUMPTIVE POSTIVE SARS-CoV-2 nucleic acids MAY BE PRESENT.   A presumptive positive result was obtained on the submitted specimen  and confirmed on repeat testing.  While 2019 novel coronavirus  (SARS-CoV-2) nucleic acids may be present in the submitted sample  additional confirmatory testing may be necessary for epidemiological  and / or clinical management purposes  to differentiate between  SARS-CoV-2 and other Sarbecovirus currently known to infect humans.  If clinically indicated additional testing with an alternate test  methodology [id number]) is advised. The SARS-CoV-2 RNA is generally  detectable in upper and lower respiratory sp ecimens during the acute  phase of infection. The expected result is Negative. Fact Sheet for Patients:  StrictlyIdeas.no Fact Sheet for Healthcare Providers: BankingDealers.co.za This test is not yet approved or cleared by the [place] FDA and has been authorized for detection and/or diagnosis of SARS-CoV-2 by FDA under an Emergency Use Authorization (EUA).  This  EUA will remain in effect (meaning this test can be used) for the duration of the COVID-19 declaration under Section 564(b)(1) of the Act, 21 U.S.C. section 360bbb-3(b)(1), unless the authorization is terminated or revoked sooner. Performed at [facility], [address] [place], [place] [address]     Coagulation Studies: No results for input(s):  LABPROT, INR in the last 72 hours.  Urinalysis:  Recent Labs  Lab 09/24/18 1028  COLORURINE YELLOW*  LABSPEC 1.011  PHURINE 7.0  GLUCOSEU 50*  HGBUR NEGATIVE  BILIRUBINUR NEGATIVE  KETONESUR NEGATIVE  PROTEINUR NEGATIVE  NITRITE NEGATIVE  LEUKOCYTESUR NEGATIVE    Lipid Panel:  No results found for: CHOL, TRIG, HDL, CHOLHDL, VLDL, LDLCALC  HgbA1C:  Lab Results  Component Value Date   HGBA1C 9.8 (H) 02/23/2015    Urine Drug Screen:  No results found for: LABOPIA, COCAINSCRNUR, LABBENZ, AMPHETMU, THCU, LABBARB  Alcohol Level: No results for input(s): ETH in the last 168 hours.  Other results: EKG: normal EKG, normal sinus rhythm, unchanged from previous tracings.  Imaging: Mr Brain Wo Contrast  Result Date: 09/24/2018 CLINICAL DATA:  53 year old male with 3 days of right arm weakness. Weeks of intermittent dizziness. EXAM: MRI HEAD WITHOUT CONTRAST TECHNIQUE: Multiplanar, multiecho pulse sequences of the brain and surrounding structures were obtained without intravenous contrast. COMPARISON:  None. FINDINGS: Brain: There is a chronic hemorrhagic lacunar infarct of the right basal ganglia. There are fairly numerous scattered chronic microhemorrhages in the brain bilaterally. There are 2 foci of abnormal diffusion in the posterior left MCA territory, most notably in the left centrum semi of bowel near this subcortical white matter of the motor strip on series 6, image 39). There is nearby microhemorrhage at that site which appears chronic. The 2nd areas in the left periatrial white matter on series 6, image 32. No associated acute hemorrhage, and no mass effect. Furthermore there is a small area of abnormal diffusion in the contralateral right periatrial white matter, this has a more subacute appearance on series 6, image 27. There is superimposed Patchy and confluent bilateral cerebral white matter T2 and FLAIR hyperintensity. No cortical encephalomalacia. Small chronic lacunar  infarcts also in the right thalamus. Wallerian degeneration in the brainstem on the right. No restricted diffusion to suggest acute infarction. No midline shift, mass effect, evidence of mass lesion, ventriculomegaly, extra-axial collection or acute intracranial hemorrhage. Cervicomedullary junction and pituitary are within normal limits. Vascular: Major intracranial vascular flow voids are preserved with generalized intracranial artery dolichoectasia. The left vertebral artery is dominant. Skull and upper cervical spine: Negative visible cervical spine. Visualized bone marrow signal is within normal limits. Sinuses/Orbits: Negative orbits. Extensive right side paranasal sinus disease including inspissated material opacifying and mildly expanding the right maxillary sinus (series 17, image 24). Left paranasal sinuses are well pneumatized. Trace retained secretions in the nasopharynx. Other: Mastoids are clear. Grossly normal visible internal auditory structures. Scalp and face soft tissues appear negative. IMPRESSION: 1. Positive for two small acute infarcts in the Left MCA territory, including at the subcortical white matter of the motor strip. No associated acute hemorrhage or mass effect. 2. Small subacute white matter infarct in the contralateral right hemisphere. 3. Severe for age chronic small vessel disease, fairly numerous chronic micro-hemorrhages, and generalized intracranial artery dolichoectasia. Query chronic hypertension. Note right brainstem Wallerian degeneration associated with a chronic hemorrhagic infarct of the right basal ganglia. 4. 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  Follow-up with ENT. Electronically Signed   By: Herminio Heads.D.  On: 09/24/2018 10:25     Assessment/Plan:  53 y.o. male with a past medical history of diabetes, hepatitis, hypertension, presents to the emergency department for right arm weakness.  According to the patient for the past several months he has been getting  intermittently dizzy, states he was never evaluated for this.  However the past 3 days he has noticed weakness in his right hand and difficulty controlling his right hand. On examination patient has weakness in the RUE grip strength.Pt has 2 small strokes on the L MCA territory and older subacute on R side.    - Pt is Hypertensive. 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 - No significant intracranial stenosis - ASA daily  - Possibly d/c tomorrow if improved.  09/24/2018, 11:31 AM

## 2018-09-24 NOTE — ED Notes (Addendum)
ED TO INPATIENT HANDOFF REPORT  ED Nurse Name and Phone #:  Gershon Mussel RN  [phone]  S Name/Age/Gender Johnny Kelley 53 y.o. male Room/Bed: ED04A/ED04A  Code Status   Code Status: Full Code  Home/SNF/Other Home Patient oriented to: self, place, time and situation Is this baseline? Yes   Triage Complete: Triage complete  Chief Complaint poss cva  Triage Note Pt to ED via POV c/o issues with balance and dizziness and also decreased muscle coordination in his right hand. Pt states that he first noticed the issue with his hand on Tuesday or Wednesday. Pt states that the problems with his balance and the dizzy spells started around christmas time. Pt states that he has not been evaluated for this. No facial droop noted at this time. No slurred speech. Pt is A & O x 4 and in NAD.    Allergies No Known Allergies  Level of Care/Admitting Diagnosis ED Disposition    ED Disposition Condition [facility]: [facility] [[id number]]  Level of Care: Med-Surg [16]  Covid Evaluation: N/A  Diagnosis: CVA (cerebral vascular accident) [facility]) [[id number]]  Admitting Physician: Gorden Harms [[id number]]  Attending Physician: Gorden Harms [[id number]]  Estimated length of stay: past midnight tomorrow  Certification:: I certify this patient will need inpatient services for at least 2 midnights  PT Class (Do Not Modify): Inpatient [101]  PT Acc Code (Do Not Modify): Private [1]       B Medical/Surgery History Past Medical History:  Diagnosis Date  . Diabetes mellitus without complication ([place])   . Hepatitis C   . Hypertension    Past Surgical History:  Procedure Laterality Date  . KNEE SURGERY Right      A IV Location/Drains/Wounds Patient Lines/Drains/Airways Status   Active Line/Drains/Airways    Name:   Placement date:   Placement time:   Site:   Days:   Peripheral IV 09/24/18 Right Antecubital   09/24/18    0837    Antecubital   less than 1    Incision (Closed) Other (Comment) Right   -    -               Intake/Output Last 24 hours No intake or output data in the 24 hours ending 09/24/18 1150  Labs/Imaging Results for orders placed or performed during the hospital encounter of 09/24/18 (from the past 48 hour(s))  CBC     Status: Abnormal   Collection Time: 09/24/18  7:48 AM  Result Value Ref Range   WBC 5.4 4.0 - 10.5 K/uL   RBC 4.78 4.22 - 5.81 MIL/uL   Hemoglobin 16.1 13.0 - 17.0 g/dL   HCT 46.4 39.0 - 52.0 %   MCV 97.1 80.0 - 100.0 fL   MCH 33.7 26.0 - 34.0 pg   MCHC 34.7 30.0 - 36.0 g/dL   RDW 12.9 11.5 - 15.5 %   Platelets 95 (L) 150 - 400 K/uL    Comment: Immature Platelet Fraction may be clinically indicated, consider ordering this additional test [id number]    nRBC 0.0 0.0 - 0.2 %    Comment: Performed at [facility], [address] [place], [place] [address]  Comprehensive metabolic panel     Status: Abnormal   Collection Time: 09/24/18  7:48 AM  Result Value Ref Range   Sodium 135 135 - 145 mmol/L   Potassium 3.9 3.5 - 5.1 mmol/L   Chloride 102 98 - 111 mmol/L  CO2 24 22 - 32 mmol/L   Glucose, Bld 163 (H) 70 - 99 mg/dL   BUN 12 6 - 20 mg/dL   Creatinine, Ser 0.67 0.61 - 1.24 mg/dL   Calcium 9.2 8.9 - 10.3 mg/dL   Total Protein 7.6 6.5 - 8.1 g/dL   Albumin 3.4 (L) 3.5 - 5.0 g/dL   AST 57 (H) 15 - 41 U/L   ALT 43 0 - 44 U/L   Alkaline Phosphatase 124 38 - 126 U/L   Total Bilirubin 1.7 (H) 0.3 - 1.2 mg/dL   GFR calc non Af Amer >60 >60 mL/min   GFR calc Af Amer >60 >60 mL/min   Anion gap 9 5 - 15    Comment: Performed at [facility], [address], [place], [place] [address]  SARS Coronavirus 2 (CEPHEID - Performed in [place] hospital lab), Hosp Order     Status: None   Collection Time: 09/24/18  8:29 AM   Specimen: Nasopharyngeal Swab  Result Value Ref Range   SARS Coronavirus 2 NEGATIVE NEGATIVE    Comment: (NOTE) If result is NEGATIVE SARS-CoV-2 target nucleic  acids are NOT DETECTED. The SARS-CoV-2 RNA is generally detectable in upper and lower  respiratory specimens during the acute phase of infection. The lowest  concentration of SARS-CoV-2 viral copies this assay can detect is 250  copies / mL. A negative result does not preclude SARS-CoV-2 infection  and should not be used as the sole basis for treatment or other  patient management decisions.  A negative result may occur with  improper specimen collection / handling, submission of specimen other  than nasopharyngeal swab, presence of viral mutation(s) within the  areas targeted by this assay, and inadequate number of viral copies  (<250 copies / mL). A negative result must be combined with clinical  observations, patient history, and epidemiological information. If result is POSITIVE SARS-CoV-2 target nucleic acids are DETECTED. The SARS-CoV-2 RNA is generally detectable in upper and lower  respiratory specimens dur ing the acute phase of infection.  Positive  results are indicative of active infection with SARS-CoV-2.  Clinical  correlation with patient history and other diagnostic information is  necessary to determine patient infection status.  Positive results do  not rule out bacterial infection or co-infection with other viruses. If result is PRESUMPTIVE POSTIVE SARS-CoV-2 nucleic acids MAY BE PRESENT.   A presumptive positive result was obtained on the submitted specimen  and confirmed on repeat testing.  While 2019 novel coronavirus  (SARS-CoV-2) nucleic acids may be present in the submitted sample  additional confirmatory testing may be necessary for epidemiological  and / or clinical management purposes  to differentiate between  SARS-CoV-2 and other Sarbecovirus currently known to infect humans.  If clinically indicated additional testing with an alternate test  methodology [phone]) is advised. The SARS-CoV-2 RNA is generally  detectable in upper and lower respiratory  sp ecimens during the acute  phase of infection. The expected result is Negative. Fact Sheet for Patients:  StrictlyIdeas.no Fact Sheet for Healthcare Providers: BankingDealers.co.za This test is not yet approved or cleared by the [place] FDA and has been authorized for detection and/or diagnosis of SARS-CoV-2 by FDA under an Emergency Use Authorization (EUA).  This EUA will remain in effect (meaning this test can be used) for the duration of the COVID-19 declaration under Section 564(b)(1) of the Act, 21 U.S.C. section 360bbb-3(b)(1), unless the authorization is terminated or revoked sooner. Performed at [facility],  [place], [place] [address]   Urinalysis, Complete w Microscopic     Status: Abnormal   Collection Time: 09/24/18 10:28 AM  Result Value Ref Range   Color, Urine YELLOW (A) YELLOW   APPearance CLEAR (A) CLEAR   Specific Gravity, Urine 1.011 1.005 - 1.030   pH 7.0 5.0 - 8.0   Glucose, UA 50 (A) NEGATIVE mg/dL   Hgb urine dipstick NEGATIVE NEGATIVE   Bilirubin Urine NEGATIVE NEGATIVE   Ketones, ur NEGATIVE NEGATIVE mg/dL   Protein, ur NEGATIVE NEGATIVE mg/dL   Nitrite NEGATIVE NEGATIVE   Leukocytes,Ua NEGATIVE NEGATIVE   RBC / HPF 0-5 0 - 5 RBC/hpf   WBC, UA 0-5 0 - 5 WBC/hpf   Bacteria, UA NONE SEEN NONE SEEN   Squamous Epithelial / LPF 0-5 0 - 5    Comment: Performed at [facility], [address], [place], [place] [address]   Mr Brain 29 Contrast  Result Date: 09/24/2018 CLINICAL DATA:  53 year old male with 3 days of right arm weakness. Weeks of intermittent dizziness. EXAM: MRI HEAD WITHOUT CONTRAST TECHNIQUE: Multiplanar, multiecho pulse sequences of the brain and surrounding structures were obtained without intravenous contrast. COMPARISON:  None. FINDINGS: Brain: There is a chronic hemorrhagic lacunar infarct of the right basal ganglia. There are fairly numerous  scattered chronic microhemorrhages in the brain bilaterally. There are 2 foci of abnormal diffusion in the posterior left MCA territory, most notably in the left centrum semi of bowel near this subcortical white matter of the motor strip on series 6, image 39). There is nearby microhemorrhage at that site which appears chronic. The 2nd areas in the left periatrial white matter on series 6, image 32. No associated acute hemorrhage, and no mass effect. Furthermore there is a small area of abnormal diffusion in the contralateral right periatrial white matter, this has a more subacute appearance on series 6, image 27. There is superimposed Patchy and confluent bilateral cerebral white matter T2 and FLAIR hyperintensity. No cortical encephalomalacia. Small chronic lacunar infarcts also in the right thalamus. Wallerian degeneration in the brainstem on the right. No restricted diffusion to suggest acute infarction. No midline shift, mass effect, evidence of mass lesion, ventriculomegaly, extra-axial collection or acute intracranial hemorrhage. Cervicomedullary junction and pituitary are within normal limits. Vascular: Major intracranial vascular flow voids are preserved with generalized intracranial artery dolichoectasia. The left vertebral artery is dominant. Skull and upper cervical spine: Negative visible cervical spine. Visualized bone marrow signal is within normal limits. Sinuses/Orbits: Negative orbits. Extensive right side paranasal sinus disease including inspissated material opacifying and mildly expanding the right maxillary sinus (series 17, image 24). Left paranasal sinuses are well pneumatized. Trace retained secretions in the nasopharynx. Other: Mastoids are clear. Grossly normal visible internal auditory structures. Scalp and face soft tissues appear negative. IMPRESSION: 1. Positive for two small acute infarcts in the Left MCA territory, including at the subcortical white matter of the motor strip. No  associated acute hemorrhage or mass effect. 2. Small subacute white matter infarct in the contralateral right hemisphere. 3. Severe for age chronic small vessel disease, fairly numerous chronic micro-hemorrhages, and generalized intracranial artery dolichoectasia. Query chronic hypertension. Note right brainstem Wallerian degeneration associated with a chronic hemorrhagic infarct of the right basal ganglia. 4. 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  Follow-up with ENT. Electronically Signed   By: Genevie Ann M.D.   On: 09/24/2018 10:25    Pending Labs Unresulted Labs (From admission, onward)    Start  Ordered   10/01/18 0500  Creatinine, serum  (enoxaparin (LOVENOX)    CrCl >/= 30 ml/min)  Weekly,   STAT    Comments: while on enoxaparin therapy    09/24/18 1143   09/25/18 0500  Hemoglobin A1c  Tomorrow morning,   STAT     09/24/18 1144   09/25/18 0500  Lipid panel  Tomorrow morning,   STAT    Comments: Fasting    09/24/18 1144   09/24/18 1144  HIV antibody (Routine Testing)  Once,   STAT     09/24/18 1144   09/24/18 1144  CBC  (enoxaparin (LOVENOX)    CrCl >/= 30 ml/min)  Once,   STAT    Comments: Baseline for enoxaparin therapy IF NOT ALREADY DRAWN.  Notify MD if PLT < 100 K.    09/24/18 1143   09/24/18 1144  Creatinine, serum  (enoxaparin (LOVENOX)    CrCl >/= 30 ml/min)  Once,   STAT    Comments: Baseline for enoxaparin therapy IF NOT ALREADY DRAWN.    09/24/18 1143   09/24/18 1144  Urine Drug Screen, Qualitative ([facility] only)  Once,   STAT     09/24/18 1144   09/24/18 1144  Hemoglobin A1c  Once,   STAT     09/24/18 1144          Vitals/Pain Today's Vitals   09/24/18 0728 09/24/18 0731 09/24/18 1116  BP:  (!) 177/98 (!) 177/110  Pulse:  76 82  Resp:  16 16  Temp:  98.1 F (36.7 C)   TempSrc:  Oral   SpO2:  96% 96%  Weight: 95.3 kg    Height: 6\' 2"  (1.88 m)    PainSc: 0-No pain  7     Isolation Precautions No active isolations  Medications Medications    stroke: mapping our early stages of recovery book (has no administration in time range)  acetaminophen (TYLENOL) tablet 650 mg (has no administration in time range)    Or  acetaminophen (TYLENOL) solution 650 mg (has no administration in time range)    Or  acetaminophen (TYLENOL) suppository 650 mg (has no administration in time range)  enoxaparin (LOVENOX) injection 40 mg (has no administration in time range)  insulin aspart (novoLOG) injection 0-15 Units (has no administration in time range)  insulin aspart (novoLOG) injection 0-5 Units (has no administration in time range)  hydrALAZINE (APRESOLINE) injection 10 mg (has no administration in time range)  aspirin suppository 300 mg (has no administration in time range)    Or  aspirin tablet 325 mg (has no administration in time range)  atorvastatin (LIPITOR) tablet 40 mg (has no administration in time range)  nicotine (NICODERM CQ - dosed in mg/24 hours) patch 14 mg (has no administration in time range)    Mobility walks Moderate fall risk   Focused Assessments Neuro Assessment Handoff:  Swallow screen pass? Yes    NIH Stroke Scale ( + Modified Stroke Scale Criteria)  LOC Questions (1b. )   +: Answers both questions correctly LOC Commands (1c. )   + : Performs both tasks correctly Best Gaze (2. )  +: Normal Visual (3. )  +: No visual loss Motor Arm, Left (5a. )   +: No drift Motor Arm, Right (5b. )   +: Drift Motor Leg, Left (6a. )   +: No drift Motor Leg, Right (6b. )   +: No drift Sensory (8. )   +: Normal, no sensory loss Best Language (9. )   +:  No aphasia Extinction/Inattention (11.)   +: No Abnormality Modified SS Total  +: 1     Neuro Assessment: Exceptions to WDL Neuro Checks:      Last Documented NIHSS Modified Score: 1 (09/24/18 1049) Has TPA been given? No If patient is a Neuro Trauma and patient is going to OR before floor call report to [place] nurse: [phone] or  [phone]     R Recommendations: See Admitting Provider Note  Report given to:  Estill Bamberg RN on 1C  Additional Notes:

## 2018-09-24 NOTE — Progress Notes (Signed)
PT Cancellation Note  Patient Details Name: Johnny Kelley MRN: [id number] DOB: July 10, 1965   Cancelled Treatment:    Reason Eval/Treat Not Completed: Other (comment).  PT consult received.  Chart reviewed.  Pt receiving an echo and not available for PT session.  Will re-attempt PT evaluation at a later date/time.  Leitha Bleak, PT 09/24/18, 1:35 PM [phone]

## 2018-09-24 NOTE — H&P (Signed)
Johnny Kelley at [place] NAME: Johnny Kelley    MR#:  [id number]  DATE OF BIRTH:  01-Sep-1965  DATE OF ADMISSION:  09/24/2018  PRIMARY CARE PHYSICIAN: Patient, No Pcp Per   REQUESTING/REFERRING PHYSICIAN:   CHIEF COMPLAINT:   Chief Complaint  Patient presents with  . Gait Problem    HISTORY OF PRESENT ILLNESS: Johnny Kelley  is a 53 y.o. male with a known history history of her below presents emergency room with 3-day history of right hand clumsiness, weakness of right upper extremity, difficulty with ambulation, right leg weakness as well, denies vision/speech problems, and emergency room patient was found to have acute left CVA on MRI of the brain, hospitalist asked to admit, patient evaluated in the emergency room, no apparent distress, resting comfortably in bed, patient now be admitted for acute left-sided cerebrovascular accident.  PAST MEDICAL HISTORY:   Past Medical History:  Diagnosis Date  . Diabetes mellitus without complication ([place])   . Hepatitis C   . Hypertension     PAST SURGICAL HISTORY:  Past Surgical History:  Procedure Laterality Date  . KNEE SURGERY Right     SOCIAL HISTORY:  Social History   Tobacco Use  . Smoking status: Current Every Day Smoker    Packs/day: 1.00    Types: Cigarettes  . Smokeless tobacco: Never Used  Substance Use Topics  . Alcohol use: Yes    Comment: 4-5 24 oz beer daily    FAMILY HISTORY:  Family History  Problem Relation Age of Onset  . Colon cancer Father   . Diabetes Father     DRUG ALLERGIES: No Known Allergies  REVIEW OF SYSTEMS:   CONSTITUTIONAL: No fever, fatigue or weakness.  EYES: No blurred or double vision.  EARS, NOSE, AND THROAT: No tinnitus or ear pain.  RESPIRATORY: No cough, shortness of breath, wheezing or hemoptysis.  CARDIOVASCULAR: No chest pain, orthopnea, edema.  GASTROINTESTINAL: No nausea, vomiting, diarrhea or abdominal pain.  GENITOURINARY: No  dysuria, hematuria.  ENDOCRINE: No polyuria, nocturia,  HEMATOLOGY: No anemia, easy bruising or bleeding SKIN: No rash or lesion. MUSCULOSKELETAL: No joint pain or arthritis.   NEUROLOGIC: No tingling, numbness, + right upper extremity/leg weakness.  PSYCHIATRY: No anxiety or depression.   MEDICATIONS AT HOME:  Prior to Admission medications   Medication Sig Start Date End Date Taking? Authorizing Provider  metFORMIN (GLUCOPHAGE) 850 MG tablet Take 1 tablet (850 mg total) by mouth 2 (two) times daily with a meal. 05/28/17  Yes Sable Feil, PA-C      PHYSICAL EXAMINATION:   VITAL SIGNS: Blood pressure (!) 177/98, pulse 76, temperature 98.1 F (36.7 C), temperature source Oral, resp. rate 16, height 6\' 2"  (1.88 m), weight 95.3 kg, SpO2 96 %.  GENERAL:  53 y.o.-year-old patient lying in the bed with no acute distress.  EYES: Pupils equal, round, reactive to light and accommodation. No scleral icterus. Extraocular muscles intact.  HEENT: Head atraumatic, normocephalic. Oropharynx and nasopharynx clear.  NECK:  Supple, no jugular venous distention. No thyroid enlargement, no tenderness.  LUNGS: Normal breath sounds bilaterally, no wheezing, rales,rhonchi or crepitation. No use of accessory muscles of respiration.  CARDIOVASCULAR: S1, S2 normal. No murmurs, rubs, or gallops.  ABDOMEN: Soft, nontender, nondistended. Bowel sounds present. No organomegaly or mass.  EXTREMITIES: No pedal edema, cyanosis, or clubbing.  NEUROLOGIC: Cranial nerves II through XII are intact.  Right upper extremity 4/5 in motor strength, pronator drift  Gait not checked.  PSYCHIATRIC: The patient is alert and oriented x 3.  SKIN: No obvious rash, lesion, or ulcer.   LABORATORY PANEL:   CBC Recent Labs  Lab 09/24/18 0748  WBC 5.4  HGB 16.1  HCT 46.4  PLT 95*  MCV 97.1  MCH 33.7  MCHC 34.7  RDW 12.9    ------------------------------------------------------------------------------------------------------------------  Chemistries  Recent Labs  Lab 09/24/18 0748  NA 135  K 3.9  CL 102  CO2 24  GLUCOSE 163*  BUN 12  CREATININE 0.67  CALCIUM 9.2  AST 57*  ALT 43  ALKPHOS 124  BILITOT 1.7*   ------------------------------------------------------------------------------------------------------------------ estimated creatinine clearance is 121.3 mL/min (by C-G formula based on SCr of 0.67 mg/dL). ------------------------------------------------------------------------------------------------------------------ No results for input(s): TSH, T4TOTAL, T3FREE, THYROIDAB in the last 72 hours.  Invalid input(s): FREET3   Coagulation profile No results for input(s): INR, PROTIME in the last 168 hours. ------------------------------------------------------------------------------------------------------------------- No results for input(s): DDIMER in the last 72 hours. -------------------------------------------------------------------------------------------------------------------  Cardiac Enzymes No results for input(s): CKMB, TROPONINI, MYOGLOBIN in the last 168 hours.  Invalid input(s): CK ------------------------------------------------------------------------------------------------------------------ Invalid input(s): POCBNP  ---------------------------------------------------------------------------------------------------------------  Urinalysis    Component Value Date/Time   COLORURINE YELLOW (A) 09/24/2018 1028   APPEARANCEUR CLEAR (A) 09/24/2018 1028   LABSPEC 1.011 09/24/2018 1028   PHURINE 7.0 09/24/2018 1028   GLUCOSEU 50 (A) 09/24/2018 1028   HGBUR NEGATIVE 09/24/2018 1028   BILIRUBINUR NEGATIVE 09/24/2018 1028   KETONESUR NEGATIVE 09/24/2018 1028   PROTEINUR NEGATIVE 09/24/2018 1028   NITRITE NEGATIVE 09/24/2018 1028   LEUKOCYTESUR NEGATIVE 09/24/2018 1028      RADIOLOGY: Mr Brain Wo Contrast  Result Date: 09/24/2018 CLINICAL DATA:  53 year old male with 3 days of right arm weakness. Weeks of intermittent dizziness. EXAM: MRI HEAD WITHOUT CONTRAST TECHNIQUE: Multiplanar, multiecho pulse sequences of the brain and surrounding structures were obtained without intravenous contrast. COMPARISON:  None. FINDINGS: Brain: There is a chronic hemorrhagic lacunar infarct of the right basal ganglia. There are fairly numerous scattered chronic microhemorrhages in the brain bilaterally. There are 2 foci of abnormal diffusion in the posterior left MCA territory, most notably in the left centrum semi of bowel near this subcortical white matter of the motor strip on series 6, image 39). There is nearby microhemorrhage at that site which appears chronic. The 2nd areas in the left periatrial white matter on series 6, image 32. No associated acute hemorrhage, and no mass effect. Furthermore there is a small area of abnormal diffusion in the contralateral right periatrial white matter, this has a more subacute appearance on series 6, image 27. There is superimposed Patchy and confluent bilateral cerebral white matter T2 and FLAIR hyperintensity. No cortical encephalomalacia. Small chronic lacunar infarcts also in the right thalamus. Wallerian degeneration in the brainstem on the right. No restricted diffusion to suggest acute infarction. No midline shift, mass effect, evidence of mass lesion, ventriculomegaly, extra-axial collection or acute intracranial hemorrhage. Cervicomedullary junction and pituitary are within normal limits. Vascular: Major intracranial vascular flow voids are preserved with generalized intracranial artery dolichoectasia. The left vertebral artery is dominant. Skull and upper cervical spine: Negative visible cervical spine. Visualized bone marrow signal is within normal limits. Sinuses/Orbits: Negative orbits. Extensive right side paranasal sinus disease  including inspissated material opacifying and mildly expanding the right maxillary sinus (series 17, image 24). Left paranasal sinuses are well pneumatized. Trace retained secretions in the nasopharynx. Other: Mastoids are clear. Grossly normal visible internal auditory structures. Scalp and face soft tissues appear negative. IMPRESSION: 1. Positive  for two small acute infarcts in the Left MCA territory, including at the subcortical white matter of the motor strip. No associated acute hemorrhage or mass effect. 2. Small subacute white matter infarct in the contralateral right hemisphere. 3. Severe for age chronic small vessel disease, fairly numerous chronic micro-hemorrhages, and generalized intracranial artery dolichoectasia. Query chronic hypertension. Note right brainstem Wallerian degeneration associated with a chronic hemorrhagic infarct of the right basal ganglia. 4. 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  Follow-up with ENT. Electronically Signed   By: Genevie Ann M.D.   On: 09/24/2018 10:25    EKG: Orders placed or performed during the hospital encounter of 09/24/18  . EKG 12-Lead  . EKG 12-Lead    IMPRESSION AND PLAN:  *Acute ischemic cerebrovascular accident Likely embolic given pattern MRI of the brain Admit to regular nursing for bed on our stroke protocol with telemetry, consult neurology for expert opinion, aspirin, statin therapy, echocardiogram, carotid Dopplers, EKG daily, and continue close medical monitoring  *Chronic diabetes mellitus type 2 Hold metformin, sliding scale insulin with Accu-Cheks per routine, check hemoglobin A1c  *Chronic tobacco smoker abuse/dependency Cessation counseling ordered, nicotine patch daily  *Hypertension Hydralazine PRN systolic blood pressure greater than 160  DVT prophylaxis-Lovenox subcu Disposition Home in 1 to 2 days barring any complications  All the records are reviewed and case discussed with ED provider. Management plans discussed  with the patient, family and they are in agreement.  CODE STATUS:full Code Status History    Date Active Date Inactive Code Status Order ID Comments User Context   02/22/2015 2026 02/24/2015 1741 Full Code [id number]  Vaughan Basta, MD Inpatient   Advance Care Planning Activity       TOTAL TIME TAKING CARE OF THIS PATIENT: 40 minutes.    Avel Peace Salary M.D on 09/24/2018   Between 7am to 6pm - Pager - [phone]  After 6pm go to [email or web address] - password [email or web address] [facility]  Office  [phone]  CC: Primary care physician; Patient, No Pcp Per   Note: This dictation was prepared with Dragon dictation along with smaller phrase technology. Any transcriptional errors that result from this process are unintentional.

## 2018-09-24 NOTE — Progress Notes (Signed)
OT Cancellation Note  Patient Details Name: ADIEN KIMMEL MRN: [id number] DOB: Jun 07, 1965   Cancelled Treatment:    Reason Eval/Treat Not Completed: Other (comment). Consult received, chart reviewed. Upon attempt, pt receiving an echo. Will re-attempt at later date/time as pt is available and medically appropriate.   Jeni Salles, MPH, MS, OTR/L ascom [phone] 09/24/18, 1:31 PM

## 2018-09-24 NOTE — ED Notes (Signed)
Pt taken to x-ray and CT. Will transport to floor upon return from CT.

## 2018-09-24 NOTE — ED Triage Notes (Signed)
Pt to ED via POV c/o issues with balance and dizziness and also decreased muscle coordination in his right hand. Pt states that he first noticed the issue with his hand on Tuesday or Wednesday. Pt states that the problems with his balance and the dizzy spells started around christmas time. Pt states that he has not been evaluated for this. No facial droop noted at this time. No slurred speech. Pt is A & O x 4 and in NAD.

## 2018-09-24 NOTE — Progress Notes (Signed)
*  PRELIMINARY RESULTS* Echocardiogram 2D Echocardiogram has been performed.  Johnny Kelley 09/24/2018, 2:26 PM

## 2018-09-24 NOTE — ED Notes (Signed)
Pt to mri 

## 2018-09-24 NOTE — Progress Notes (Signed)
Dr Jerelyn Charles made aware that pt has a scattered red rash from head to toe and ? Dark dry looking moles in his pubic area that may need attention, pt states that he has had this for a while but doesn't have a PCP and hasn't seen a doctor about this

## 2018-09-24 NOTE — Progress Notes (Signed)
Family Meeting Note  Advance Directive:yes  Today a meeting took place with the Patient.  Patient is able to participate   The following clinical team members were present during this meeting:MD  The following were discussed:Patient's diagnosis: 53 y.o. male with a known history history of her below presents emergency room with 3-day history of right hand clumsiness, weakness of right upper extremity, difficulty with ambulation, right leg weakness as well, denies vision/speech problems, and emergency room patient was found to have acute left CVA on MRI of the brain, hospitalist asked to admit, patient evaluated in the emergency room, no apparent distress, resting comfortably in bed, patient now be admitted for acute left-sided cerebrovascular accident, Patient's progosis: Unable to determine and Goals for treatment: Full Code  Additional follow-up to be provided: prn  Time spent during discussion:20 minutes  Gorden Harms, MD

## 2018-09-25 LAB — LIPID PANEL
Cholesterol: 156 mg/dL (ref 0–200)
HDL: 73 mg/dL (ref >40)
LDL Cholesterol: 70 mg/dL (ref 0–99)
Total CHOL/HDL Ratio: 2.1 RATIO
Triglycerides: 64 mg/dL (ref <150)
VLDL: 13 mg/dL (ref 0–40)

## 2018-09-25 LAB — GLUCOSE, CAPILLARY: Glucose-Capillary: 155 mg/dL — ABNORMAL HIGH (ref 70–99)

## 2018-09-25 LAB — HEMOGLOBIN A1C
Hgb A1c MFr Bld: 7.5 % — ABNORMAL HIGH (ref 4.8–5.6)
Mean Plasma Glucose: 169 mg/dL

## 2018-09-25 MED ORDER — AMLODIPINE BESYLATE 10 MG PO TABS: 10.0000 mg | ORAL_TABLET | Freq: Every day | ORAL | 11 refills | Status: DC

## 2018-09-25 MED ORDER — ATORVASTATIN CALCIUM 40 MG PO TABS: 40.0000 mg | ORAL_TABLET | Freq: Every day | ORAL | 0 refills | Status: DC

## 2018-09-25 MED ORDER — ASPIRIN 325 MG PO TABS: 325.0000 mg | ORAL_TABLET | Freq: Every day | ORAL | 0 refills | Status: DC

## 2018-09-25 MED ORDER — NICOTINE 14 MG/24HR TD PT24: 14.0000 mg | MEDICATED_PATCH | Freq: Every day | TRANSDERMAL | 0 refills | Status: DC

## 2018-09-25 NOTE — Evaluation (Signed)
SLP Screening  Patient Details Name: Johnny Kelley MRN: [id number] DOB: 1965-05-14   SLP   Reason Eval/Treat Not Completed: SLP screened, no needs identified, will sign off  Educated pt to contact NSG if changes in ability to utilize speech or swallow function.   [place] Sauber 09/25/2018, 9:25 AM

## 2018-09-25 NOTE — Evaluation (Signed)
Physical Therapy Evaluation Patient Details Name: Johnny Kelley MRN: [id number] DOB: 1965/09/22 Today's Date: 09/25/2018   History of Present Illness  Johnny Kelley is an 53 y.o. male with a past medical history of diabetes, hepatitis, hypertension, presented to the emergency department for right arm weakness.  According to the patient, the past 3 days he has noticed weakness in his right hand and difficulty controlling his right hand. MRI (+) fir 2 small infarcts on the L MCA territory and older subacute on R side.  Clinical Impression  Pt is a pleasant 53 year old male who was admitted for L MCA CVA. Pt performs transfers with independence and ambulation with supervision. Pt demonstrates deficits with R UE strength/balance. Coordination in R LE intact. Would benefit from skilled PT to address above deficits and promote optimal return to PLOF. Recommending OP PT at this time for balance training and further fall prevention.   Follow Up Recommendations Outpatient PT    Equipment Recommendations  None recommended by PT    Recommendations for Other Services       Precautions / Restrictions Precautions Precautions: Fall Restrictions Weight Bearing Restrictions: No      Mobility  Bed Mobility Overal bed mobility: Modified Independent             General bed mobility comments: not performed as pt received sitting at EOB  Transfers Overall transfer level: Independent Equipment used: None Transfers: Sit to/from Stand Sit to Stand: Independent         General transfer comment: upright posture  Ambulation/Gait Ambulation/Gait assistance: Supervision Gait Distance (Feet): 40 Feet Assistive device: Rolling walker (2 wheeled) Gait Pattern/deviations: Step-through pattern     General Gait Details: RW adjusted to pt height. Upright posture, however slight unsteadiness with cues for RW sequencing. Appears to hinder balance more than assist. Further ambulation  performed without AD  Stairs            Wheelchair Mobility    Modified Rankin (Stroke Patients Only)       Balance Overall balance assessment: Mild deficits observed, not formally tested                                           Pertinent Vitals/Pain Pain Assessment: No/denies pain    Home Living Family/patient expects to be discharged to:: Private residence Living Arrangements: Alone Available Help at Discharge: Family;Available PRN/intermittently(brother lives next door) Type of Home: House Home Access: Stairs to enter Entrance Stairs-Rails: None Entrance Stairs-Number of Steps: 1 Home Layout: One level Home Equipment: Bedside commode Additional Comments: Pt states brother's bathroom has walk-in shower with a grab bar.    Prior Function Level of Independence: Independent         Comments: Pt states he is independent in all areas of ADL. Does need to use furniture, door knobs, etc. for stability when ambulating within his home. Does not think a RW would fit in his home. Rides his bike for all mobility     Hand Dominance   Dominant Hand: Right    Extremity/Trunk Assessment   Upper Extremity Assessment Upper Extremity Assessment: Generalized weakness RUE Deficits / Details: R UE grossly 4/5 L UE 5/5 RUE Sensation: decreased proprioception;history of peripheral neuropathy RUE Coordination: decreased fine motor    Lower Extremity Assessment Lower Extremity Assessment: Overall WFL for tasks assessed    Cervical / Trunk Assessment  Cervical / Trunk Assessment: Normal  Communication   Communication: No difficulties  Cognition Arousal/Alertness: Awake/alert Behavior During Therapy: WFL for tasks assessed/performed Overall Cognitive Status: Within Functional Limits for tasks assessed                                        General Comments General comments (skin integrity, edema, etc.): reports 2 previous falls at home  while ambulating at night without the lights on    Exercises Other Exercises Other Exercises: educated in falls prevention with/without RW. Ambulated additional 14' without AD with supervision with cues for balance.    Assessment/Plan    PT Assessment Patient needs continued PT services  PT Problem List Decreased strength;Decreased balance;Decreased mobility;Decreased safety awareness;Decreased knowledge of use of DME       PT Treatment Interventions DME instruction;Gait training;Stair training;Therapeutic exercise;Balance training    PT Goals (Current goals can be found in the Care Plan section)  Acute Rehab PT Goals Patient Stated Goal: To go home PT Goal Formulation: With patient Time For Goal Achievement: 10/09/18 Potential to Achieve Goals: Good    Frequency 7X/week   Barriers to discharge        Co-evaluation               AM-PAC PT "6 Clicks" Mobility  Outcome Measure Help needed turning from your back to your side while in a flat bed without using bedrails?: None Help needed moving from lying on your back to sitting on the side of a flat bed without using bedrails?: None Help needed moving to and from a bed to a chair (including a wheelchair)?: None Help needed standing up from a chair using your arms (e.g., wheelchair or bedside chair)?: None Help needed to walk in hospital room?: A Little Help needed climbing 3-5 steps with a railing? : A Little 6 Click Score: 22    End of Session   Activity Tolerance: Patient tolerated treatment well Patient left: in bed;with bed alarm set Nurse Communication: Mobility status PT Visit Diagnosis: Unsteadiness on feet (R26.81);Muscle weakness (generalized) (M62.81);History of falling (Z91.81);Difficulty in walking, not elsewhere classified (R26.2)    Time: 8127-5170 PT Time Calculation (min) (ACUTE ONLY): 17 min   Charges:   PT Evaluation $PT Eval Low Complexity: 1 Low PT Treatments $Gait Training: 8-22 mins         Greggory Stallion, PT, DPT [phone]   Khalee Mazo 09/25/2018, 11:19 AM

## 2018-09-25 NOTE — Discharge Summary (Signed)
Johnny Kelley at [place] NAME: Johnny Kelley    MR#:  [id number]  DATE OF BIRTH:  [birth year]  DATE OF ADMISSION:  09/24/2018 ADMITTING PHYSICIAN: Gorden Harms, MD  DATE OF DISCHARGE: September 25, 2018  PRIMARY CARE PHYSICIAN: Patient, No Pcp Per    ADMISSION DIAGNOSIS:  CVA (cerebral vascular accident) ([place]) [I63.9] Cerebrovascular accident (CVA), unspecified mechanism ([place]) [I63.9]  DISCHARGE DIAGNOSIS:  Active Problems:   CVA (cerebral vascular accident) ([place])   SECONDARY DIAGNOSIS:   Past Medical History:  Diagnosis Date  . Diabetes mellitus without complication ([place])   . Hepatitis C   . Hypertension     HOSPITAL COURSE:   53 year old male with history of diabetes and tobacco dependence who presents to the emergency room with right arm weakness. 1.  Acute left MCA territory stroke and old or subacute stroke on right side: Patient evaluated by neurology.  Recommendations were CTA of head and neck. Patient will be discharged on oral aspirin and statin.  This was not felt to be embolic in nature but more so related to hypertension.  2.  CTA showing left vertebral aneurysm which will need to be followed up as an outpatient.  Patient will have outpatient neurology follow-up and referral to neurosurgery by the neurologist.  3.  Diabetes: Patient will continue on ADA diet and metformin.  4.  Essential hypertension: Patient will be started on Norvasc 10 mg daily. 5.Tobacco dependence: Patient is encouraged to quit smoking and willing to attempt to quit was assessed. Patient highly motivated.Counseling was provided for 4 minutes. Patient will be discharged with nicotine patch   DISCHARGE CONDITIONS AND DIET:   Stable for discharge diabetic heart healthy diet  CONSULTS OBTAINED:  Treatment Team:  Leotis Pain, MD Alexis Goodell, MD  DRUG ALLERGIES:  No Known Allergies  DISCHARGE MEDICATIONS:   Allergies as of  09/25/2018   No Known Allergies     Medication List    TAKE these medications   amLODipine 10 MG tablet Commonly known as: NORVASC Take 1 tablet (10 mg total) by mouth daily.   aspirin 325 MG tablet Take 1 tablet (325 mg total) by mouth daily. Start taking on: September 26, 2018   atorvastatin 40 MG tablet Commonly known as: LIPITOR Take 1 tablet (40 mg total) by mouth daily at 6 PM.   metFORMIN 850 MG tablet Commonly known as: GLUCOPHAGE Take 1 tablet (850 mg total) by mouth 2 (two) times daily with a meal.   nicotine 14 mg/24hr patch Commonly known as: NICODERM CQ - dosed in mg/24 hours Place 1 patch (14 mg total) onto the skin daily. Start taking on: September 26, 2018         Today   CHIEF COMPLAINT:  Patient doing better.  Reports that right arm has improved.   VITAL SIGNS:  Blood pressure (!) 156/84, pulse 79, temperature 98.4 F (36.9 C), temperature source Oral, resp. rate 16, height 6\' 2"  (1.88 m), weight 95.3 kg, SpO2 98 %.   REVIEW OF SYSTEMS:  Review of Systems  Constitutional: Negative.  Negative for chills, fever and malaise/fatigue.  HENT: Negative.  Negative for ear discharge, ear pain, hearing loss, nosebleeds and sore throat.   Eyes: Negative.  Negative for blurred vision and pain.  Respiratory: Negative.  Negative for cough, hemoptysis, shortness of breath and wheezing.   Cardiovascular: Negative.  Negative for chest pain, palpitations and leg swelling.  Gastrointestinal: Negative.  Negative for abdominal pain, blood  in stool, diarrhea, nausea and vomiting.  Genitourinary: Negative.  Negative for dysuria.  Musculoskeletal: Negative.  Negative for back pain.  Skin: Negative.   Neurological: Positive for focal weakness. Negative for dizziness, tremors, speech change, seizures and headaches.  Endo/Heme/Allergies: Negative.  Does not bruise/bleed easily.  Psychiatric/Behavioral: Negative.  Negative for depression, hallucinations and suicidal ideas.      PHYSICAL EXAMINATION:  GENERAL:  53 y.o.-year-old patient lying in the bed with no acute distress.  NECK:  Supple, no jugular venous distention. No thyroid enlargement, no tenderness.  LUNGS: Normal breath sounds bilaterally, no wheezing, rales,rhonchi  No use of accessory muscles of respiration.  CARDIOVASCULAR: S1, S2 normal. No murmurs, rubs, or gallops.  ABDOMEN: Soft, non-tender, non-distended. Bowel sounds present. No organomegaly or mass.  EXTREMITIES: No pedal edema, cyanosis, or clubbing.  PSYCHIATRIC: The patient is alert and oriented x 3.  SKIN: No obvious rash, lesion, or ulcer.  Strength right EU4/5 LE 5/5  sensation intact  DATA REVIEW:   CBC Recent Labs  Lab 09/24/18 0748  WBC 5.4  HGB 16.1  HCT 46.4  PLT 95*    Chemistries  Recent Labs  Lab 09/24/18 0748  NA 135  K 3.9  CL 102  CO2 24  GLUCOSE 163*  BUN 12  CREATININE 0.67  CALCIUM 9.2  AST 57*  ALT 43  ALKPHOS 124  BILITOT 1.7*    Cardiac Enzymes No results for input(s): TROPONINI in the last 168 hours.  Microbiology Results  @[email or web address] @  RADIOLOGY:  Ct Angio Head W Or Wo Contrast  Result Date: 09/24/2018 CLINICAL DATA:  Right arm weakness. History of diabetes hypertension hepatitis EXAM: CT ANGIOGRAPHY HEAD AND NECK TECHNIQUE: Multidetector CT imaging of the head and neck was performed using the standard protocol during bolus administration of intravenous contrast. Multiplanar CT image reconstructions and MIPs were obtained to evaluate the vascular anatomy. Carotid stenosis measurements (when applicable) are obtained utilizing NASCET criteria, using the distal internal carotid diameter as the denominator. CONTRAST:  53mL OMNIPAQUE IOHEXOL 350 MG/ML SOLN COMPARISON:  MRI head 09/24/2018 FINDINGS: CT HEAD FINDINGS Brain: Mild atrophy. Ventricle size normal. Bilateral white matter ischemia. MRI today demonstrated small area of acute infarct in the right posterior temporal periventricular  white matter and the left posterior temporal white matter. These are not well seen on CT. No acute cortical infarct on CT. Small hyperintensity in the right temporal lobe axial image 11 corresponds an area of chronic hemorrhage on MRI. No acute hemorrhage or mass. Vascular: Negative for hyperdense vessel Skull: Negative Sinuses: Mucosal edema paranasal sinuses most notably right maxillary and ethmoid and right sphenoid sinus. Air-fluid level right sphenoid sinus. Orbits: Negative Review of the MIP images confirms the above findings CTA NECK FINDINGS Aortic arch: Standard branching. Imaged portion shows no evidence of aneurysm or dissection. No significant stenosis of the major arch vessel origins. Right carotid system: Mild atherosclerotic calcification right carotid bulb without significant stenosis. Mild atherosclerotic disease right common carotid artery. Left carotid system: Mild atherosclerotic disease left common carotid artery and left carotid bulb without significant stenosis Vertebral arteries: Left vertebral artery dominant. Both vertebral arteries widely patent without stenosis. Skeleton: No acute skeletal abnormality.  Poor dentition. Other neck: Negative for mass or adenopathy in the neck. Upper chest:  lung apices clear bilaterally. Review of the MIP images confirms the above findings CTA HEAD FINDINGS Anterior circulation: Atherosclerotic calcification in the cavernous carotid bilaterally with mild stenosis bilaterally. Anterior and middle cerebral arteries widely patent without significant  stenosis or occlusion. No aneurysm. Posterior circulation: Left vertebral artery dominant and widely patent. 6 mm aneurysm distal left vertebral artery proximal to the vertebrobasilar junction. This is about 1 cm distal to left PICA. PICA patent bilaterally. Basilar patent with mild atherosclerotic irregularity but no significant stenosis. Superior cerebellar and posterior cerebral arteries patent bilaterally.  Venous sinuses: Normal enhancement Anatomic variants: None Delayed phase: Not performed Review of the MIP images confirms the above findings IMPRESSION: 1. Atrophy and white matter ischemia. Small areas of acute infarct in the posterior temporal white matter bilaterally are noted on MRI today. Numerous areas of chronic hemorrhage on MRI. 2. Mild atherosclerotic disease carotid bifurcation bilaterally without significant stenosis. Mild stenosis cavernous carotid bilaterally 3. No significant vertebral artery stenosis bilaterally. Left vertebral artery dominant. 6 mm aneurysm distal left vertebral artery proximal to the vertebrobasilar junction. 4. Negative for intracranial large vessel occlusion. Electronically Signed   By: Franchot Gallo M.D.   On: 09/24/2018 12:55   Dg Chest 2 View  Result Date: 09/24/2018 CLINICAL DATA:  Stroke. EXAM: CHEST - 2 VIEW COMPARISON:  None. FINDINGS: The heart size and mediastinal contours are within normal limits. Atherosclerotic calcification of the aortic arch. Normal pulmonary vascularity. No focal consolidation, pleural effusion, or pneumothorax. No acute osseous abnormality. IMPRESSION: 1.  No active cardiopulmonary disease. 2.  Aortic atherosclerosis (ICD10-I70.0). Electronically Signed   By: Titus Dubin M.D.   On: 09/24/2018 12:24   Ct Angio Neck W Or Wo Contrast  Result Date: 09/24/2018 CLINICAL DATA:  Right arm weakness. History of diabetes hypertension hepatitis EXAM: CT ANGIOGRAPHY HEAD AND NECK TECHNIQUE: Multidetector CT imaging of the head and neck was performed using the standard protocol during bolus administration of intravenous contrast. Multiplanar CT image reconstructions and MIPs were obtained to evaluate the vascular anatomy. Carotid stenosis measurements (when applicable) are obtained utilizing NASCET criteria, using the distal internal carotid diameter as the denominator. CONTRAST:  49mL OMNIPAQUE IOHEXOL 350 MG/ML SOLN COMPARISON:  MRI head  09/24/2018 FINDINGS: CT HEAD FINDINGS Brain: Mild atrophy. Ventricle size normal. Bilateral white matter ischemia. MRI today demonstrated small area of acute infarct in the right posterior temporal periventricular white matter and the left posterior temporal white matter. These are not well seen on CT. No acute cortical infarct on CT. Small hyperintensity in the right temporal lobe axial image 11 corresponds an area of chronic hemorrhage on MRI. No acute hemorrhage or mass. Vascular: Negative for hyperdense vessel Skull: Negative Sinuses: Mucosal edema paranasal sinuses most notably right maxillary and ethmoid and right sphenoid sinus. Air-fluid level right sphenoid sinus. Orbits: Negative Review of the MIP images confirms the above findings CTA NECK FINDINGS Aortic arch: Standard branching. Imaged portion shows no evidence of aneurysm or dissection. No significant stenosis of the major arch vessel origins. Right carotid system: Mild atherosclerotic calcification right carotid bulb without significant stenosis. Mild atherosclerotic disease right common carotid artery. Left carotid system: Mild atherosclerotic disease left common carotid artery and left carotid bulb without significant stenosis Vertebral arteries: Left vertebral artery dominant. Both vertebral arteries widely patent without stenosis. Skeleton: No acute skeletal abnormality.  Poor dentition. Other neck: Negative for mass or adenopathy in the neck. Upper chest:  lung apices clear bilaterally. Review of the MIP images confirms the above findings CTA HEAD FINDINGS Anterior circulation: Atherosclerotic calcification in the cavernous carotid bilaterally with mild stenosis bilaterally. Anterior and middle cerebral arteries widely patent without significant stenosis or occlusion. No aneurysm. Posterior circulation: Left vertebral artery dominant and widely  patent. 6 mm aneurysm distal left vertebral artery proximal to the vertebrobasilar junction. This is  about 1 cm distal to left PICA. PICA patent bilaterally. Basilar patent with mild atherosclerotic irregularity but no significant stenosis. Superior cerebellar and posterior cerebral arteries patent bilaterally. Venous sinuses: Normal enhancement Anatomic variants: None Delayed phase: Not performed Review of the MIP images confirms the above findings IMPRESSION: 1. Atrophy and white matter ischemia. Small areas of acute infarct in the posterior temporal white matter bilaterally are noted on MRI today. Numerous areas of chronic hemorrhage on MRI. 2. Mild atherosclerotic disease carotid bifurcation bilaterally without significant stenosis. Mild stenosis cavernous carotid bilaterally 3. No significant vertebral artery stenosis bilaterally. Left vertebral artery dominant. 6 mm aneurysm distal left vertebral artery proximal to the vertebrobasilar junction. 4. Negative for intracranial large vessel occlusion. Electronically Signed   By: Franchot Gallo M.D.   On: 09/24/2018 12:55   Mr Brain Wo Contrast  Result Date: 09/24/2018 CLINICAL DATA:  53 year old male with 3 days of right arm weakness. Weeks of intermittent dizziness. EXAM: MRI HEAD WITHOUT CONTRAST TECHNIQUE: Multiplanar, multiecho pulse sequences of the brain and surrounding structures were obtained without intravenous contrast. COMPARISON:  None. FINDINGS: Brain: There is a chronic hemorrhagic lacunar infarct of the right basal ganglia. There are fairly numerous scattered chronic microhemorrhages in the brain bilaterally. There are 2 foci of abnormal diffusion in the posterior left MCA territory, most notably in the left centrum semi of bowel near this subcortical white matter of the motor strip on series 6, image 39). There is nearby microhemorrhage at that site which appears chronic. The 2nd areas in the left periatrial white matter on series 6, image 32. No associated acute hemorrhage, and no mass effect. Furthermore there is a small area of abnormal  diffusion in the contralateral right periatrial white matter, this has a more subacute appearance on series 6, image 27. There is superimposed Patchy and confluent bilateral cerebral white matter T2 and FLAIR hyperintensity. No cortical encephalomalacia. Small chronic lacunar infarcts also in the right thalamus. Wallerian degeneration in the brainstem on the right. No restricted diffusion to suggest acute infarction. No midline shift, mass effect, evidence of mass lesion, ventriculomegaly, extra-axial collection or acute intracranial hemorrhage. Cervicomedullary junction and pituitary are within normal limits. Vascular: Major intracranial vascular flow voids are preserved with generalized intracranial artery dolichoectasia. The left vertebral artery is dominant. Skull and upper cervical spine: Negative visible cervical spine. Visualized bone marrow signal is within normal limits. Sinuses/Orbits: Negative orbits. Extensive right side paranasal sinus disease including inspissated material opacifying and mildly expanding the right maxillary sinus (series 17, image 24). Left paranasal sinuses are well pneumatized. Trace retained secretions in the nasopharynx. Other: Mastoids are clear. Grossly normal visible internal auditory structures. Scalp and face soft tissues appear negative. IMPRESSION: 1. Positive for two small acute infarcts in the Left MCA territory, including at the subcortical white matter of the motor strip. No associated acute hemorrhage or mass effect. 2. Small subacute white matter infarct in the contralateral right hemisphere. 3. Severe for age chronic small vessel disease, fairly numerous chronic micro-hemorrhages, and generalized intracranial artery dolichoectasia. Query chronic hypertension. Note right brainstem Wallerian degeneration associated with a chronic hemorrhagic infarct of the right basal ganglia. 4. 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  Follow-up with ENT. Electronically Signed   By:  Genevie Ann M.D.   On: 09/24/2018 10:25      Allergies as of 09/25/2018   No Known Allergies     Medication  List    TAKE these medications   amLODipine 10 MG tablet Commonly known as: NORVASC Take 1 tablet (10 mg total) by mouth daily.   aspirin 325 MG tablet Take 1 tablet (325 mg total) by mouth daily. Start taking on: September 26, 2018   atorvastatin 40 MG tablet Commonly known as: LIPITOR Take 1 tablet (40 mg total) by mouth daily at 6 PM.   metFORMIN 850 MG tablet Commonly known as: GLUCOPHAGE Take 1 tablet (850 mg total) by mouth 2 (two) times daily with a meal.   nicotine 14 mg/24hr patch Commonly known as: NICODERM CQ - dosed in mg/24 hours Place 1 patch (14 mg total) onto the skin daily. Start taking on: September 26, 2018        Aspirin prescribed at discharge?  Yes High Intensity Statin Prescribed? (Lipitor 40-80mg  or Crestor 20-40mg ): Yes   Management plans discussed with the patient and he is in agreement. Stable for discharge home  Patient should follow up with neurology CODE STATUS:     Code Status Orders  (From admission, onward)         Start     Ordered   09/24/18 1144  Full code  Continuous     09/24/18 1144        Code Status History    Date Active Date Inactive Code Status Order ID Comments User Context   02/22/2015 2026 02/24/2015 1741 Full Code [id number]  Vaughan Basta, MD Inpatient   Advance Care Planning Activity      TOTAL TIME TAKING CARE OF THIS PATIENT: 38 minutes.    Note: This dictation was prepared with Dragon dictation along with smaller phrase technology. Any transcriptional errors that result from this process are unintentional.  Bettey Costa M.D on 09/25/2018 at 9:18 AM  Between 7am to 6pm - Pager - [phone] After 6pm go to [email or web address] - password [email or web address] [facility]  Office  [phone]  CC: Primary care physician; Patient, No Pcp Per

## 2018-09-25 NOTE — Progress Notes (Signed)
Pt being discharged home with home health, discharge instructions and prescriptions reviewed with pt, states understanding, stroke book given, educated on importance of quitting smoking, states understanding, pt with no complaints

## 2018-09-25 NOTE — Care Management (Addendum)
Late Entry- Patient not qualified for [facility] home health as PT recommended outpatient PT. Called patients brother and provided him list of outpatient clinics. Patient may need PCP.

## 2018-09-25 NOTE — Evaluation (Signed)
Occupational Therapy Evaluation Patient Details Name: Johnny Kelley MRN: [id number] DOB: [birth year] Today's Date: 09/25/2018    History of Present Illness Johnny Kelley is an 53 y.o. male with a past medical history of diabetes, hepatitis, hypertension, presented to the emergency department for right arm weakness.  According to the patient, the past 3 days he has noticed weakness in his right hand and difficulty controlling his right hand. MRI (+) fir 2 small infarcts on the L MCA territory and older subacute on R side.   Clinical Impression   Pt seen for OT evaluation this date. Prior to hospital admission, pt was independent in all aspects of ADL/IADL. Endorses using household items including furniture and door knobs to support himself during household mobility. Pt states after the "first few steps" he feels more stable and can walk independently. Pt lives alone in a 1 story home with 1 step to enter and no hand rails. Pt states his home is "very old" and does not have plumbing. He uses his brother's home next-door to bathe and has a BSC in his home for personal use. Pt currently demonstrating decreased strength and [place] throughout his RUE. While strength is WFL, pt has difficulty with more complex tasks requiring fine motor movements such as cutting his food on his plate and managing buttons/clasps. Pt also endorses decreased sensation in his R (dominant) side and BLEs. Pt would benefit from skilled OT to address noted impairments and functional limitations (see below for any additional details) in order to maximize safety and independence while minimizing falls risk and caregiver burden. Upon hospital discharge, recommend HHOT to maximize pt safety and functional independence during meaningful occupations of daily life.     Follow Up Recommendations  Home health OT    Equipment Recommendations  Tub/shower seat    Recommendations for Other Services       Precautions / Restrictions  Precautions Precautions: Fall Restrictions Weight Bearing Restrictions: No      Mobility Bed Mobility Overal bed mobility: Modified Independent             General bed mobility comments: Increased time, HOB elevated.  Transfers Overall transfer level: Needs assistance Equipment used: Rolling walker (2 wheeled) Transfers: Sit to/from Stand Sit to Stand: Supervision         General transfer comment: VCs for hand placement and sequencing.    Balance Overall balance assessment: Mild deficits observed, not formally tested                                         ADL either performed or assessed with clinical judgement   ADL Overall ADL's : Needs assistance/impaired                                       General ADL Comments: Pt generally at baseline, however decreased [place] noted. Pt require min assist for UB/LB dressing tasks. Set-up for certain food items/small containers on meal tray and magmt of other FM ADL tasks. Safety with functional mobility also of concern on this date. Pt endorses furniture surfing to get around his home. Req at least supervision assist for mobility at this time.     Vision Baseline Vision/History: Wears glasses Wears Glasses: Reading only Patient Visual Report: Blurring of vision Additional Comments: Pt endorses blurry  vision/dizzy spells which have resolved somewhat, but were causing safety concerns prior to Christmas 2019. Pt states he has had 4-5 falls in past 12 months and attributes this to his vision difficulties. Has not sought evaluation.     Perception     Praxis      Pertinent Vitals/Pain Pain Assessment: No/denies pain     Hand Dominance Right   Extremity/Trunk Assessment Upper Extremity Assessment Upper Extremity Assessment: RUE deficits/detail RUE Deficits / Details: Grossly WFL. 4/5 throughout. Grip strength notably weaker on R side. Decreased [place] pt noted to tend to use gross movements  when incorporating RUE into functional task. Hand tends to resith with IPs/PIPs flexed. RUE Sensation: decreased proprioception;history of peripheral neuropathy RUE Coordination: decreased fine motor   Lower Extremity Assessment Lower Extremity Assessment: Overall WFL for tasks assessed;Defer to PT evaluation   Cervical / Trunk Assessment Cervical / Trunk Assessment: Normal   Communication Communication Communication: No difficulties   Cognition Arousal/Alertness: Awake/alert Behavior During Therapy: WFL for tasks assessed/performed Overall Cognitive Status: Within Functional Limits for tasks assessed                                     General Comments       Exercises Other Exercises Other Exercises: Pt educated in falls prevention strategies, safe use of AE for ADL/mobility, and safe positioning and ROM strategies for RUE.   Shoulder Instructions      Home Living Family/patient expects to be discharged to:: Private residence Living Arrangements: Alone Available Help at Discharge: Family;Available PRN/intermittently(Brother lives next door.) Type of Home: House(Pt states "very old house") Home Access: Stairs to enter Technical brewer of Steps: 1 Entrance Stairs-Rails: None Home Layout: One level     Bathroom Shower/Tub: None(Pt states he uses brothers house to bath. Has BSC in his house for bathroom.)         Home Equipment: Bedside commode   Additional Comments: Pt states brother's bathroom has walk-in shower with a grab bar.      Prior Functioning/Environment Level of Independence: Independent        Comments: Pt states he is independent in all areas of ADL. Does need to use furniture, door knobs, etc. for stability when ambulating within his home. Does not think a RW would fit in his home.        OT Problem List: Decreased strength;Decreased coordination;Decreased range of motion;Decreased activity tolerance;Decreased safety  awareness;Decreased knowledge of use of DME or AE;Impaired UE functional use;Impaired balance (sitting and/or standing)      OT Treatment/Interventions: Self-care/ADL training;Balance training;Therapeutic exercise;Therapeutic activities;DME and/or AE instruction;Patient/family education    OT Goals(Current goals can be found in the care plan section) Acute Rehab OT Goals Patient Stated Goal: To go home OT Goal Formulation: With patient Time For Goal Achievement: 10/09/18 Potential to Achieve Goals: Good ADL Goals Pt Will Perform Grooming: with modified independence;standing(With LRAD PRN for improved safety and functional independence.) Pt Will Perform Upper Body Dressing: with modified independence;sitting(With LRAD PRN for improved safety and functional independence) Pt Will Perform Lower Body Dressing: with modified independence;sit to/from stand;with adaptive equipment(With LRAD PRN for improved safety and functional independence)  OT Frequency: Min 1X/week   Barriers to D/C: Inaccessible home environment;Decreased caregiver support          Co-evaluation              AM-PAC OT "6 Clicks" Daily Activity  Outcome Measure Help from another person eating meals?: A Little Help from another person taking care of personal grooming?: A Little Help from another person toileting, which includes using toliet, bedpan, or urinal?: None Help from another person bathing (including washing, rinsing, drying)?: None Help from another person to put on and taking off regular upper body clothing?: A Little Help from another person to put on and taking off regular lower body clothing?: A Little 6 Click Score: 20   End of Session Equipment Utilized During Treatment: Gait belt;Rolling walker  Activity Tolerance: Patient tolerated treatment well Patient left: in bed;with call bell/phone within reach;with bed alarm set  OT Visit Diagnosis: Other abnormalities of gait and mobility  (R26.89);History of falling (Z91.81)                Time: 6378-5885 OT Time Calculation (min): 30 min Charges:  OT General Charges $OT Visit: 1 Visit OT Evaluation $OT Eval Low Complexity: 1 Low OT Treatments $Self Care/Home Management : 23-37 mins  Shara Blazing, M.S., OTR/L Ascom: [phone] 09/25/18, 9:51 AM

## 2018-09-26 LAB — HIV ANTIBODY (ROUTINE TESTING W REFLEX): HIV Screen 4th Generation wRfx: NONREACTIVE

## 2018-09-27 LAB — HEMOGLOBIN A1C
Hgb A1c MFr Bld: 7.2 % — ABNORMAL HIGH (ref 4.8–5.6)
Mean Plasma Glucose: 160 mg/dL

## 2019-10-10 ENCOUNTER — Other Ambulatory Visit: Payer: Self-pay

## 2019-10-10 ENCOUNTER — Emergency Department: Payer: Self-pay

## 2019-10-10 ENCOUNTER — Emergency Department
Admission: EM | Admit: 2019-10-10 | Discharge: 2019-10-10 | Disposition: A | Discharge status: Home / Self Care | Payer: Self-pay | Attending: Emergency Medicine | Admitting: Emergency Medicine

## 2019-10-10 ENCOUNTER — Encounter: Payer: Self-pay | Admitting: Emergency Medicine

## 2019-10-10 DIAGNOSIS — R160 Hepatomegaly, not elsewhere classified: Secondary | ICD-10-CM | POA: Insufficient documentation

## 2019-10-10 DIAGNOSIS — R202 Paresthesia of skin: Secondary | ICD-10-CM

## 2019-10-10 DIAGNOSIS — F1410 Cocaine abuse, uncomplicated: Secondary | ICD-10-CM | POA: Insufficient documentation

## 2019-10-10 DIAGNOSIS — I10 Essential (primary) hypertension: Secondary | ICD-10-CM | POA: Insufficient documentation

## 2019-10-10 DIAGNOSIS — K769 Liver disease, unspecified: Secondary | ICD-10-CM | POA: Diagnosis not present

## 2019-10-10 DIAGNOSIS — R7401 Elevation of levels of liver transaminase levels: Secondary | ICD-10-CM | POA: Diagnosis not present

## 2019-10-10 DIAGNOSIS — Z7984 Long term (current) use of oral hypoglycemic drugs: Secondary | ICD-10-CM | POA: Insufficient documentation

## 2019-10-10 DIAGNOSIS — F17210 Nicotine dependence, cigarettes, uncomplicated: Secondary | ICD-10-CM | POA: Insufficient documentation

## 2019-10-10 DIAGNOSIS — F1210 Cannabis abuse, uncomplicated: Secondary | ICD-10-CM | POA: Insufficient documentation

## 2019-10-10 DIAGNOSIS — Z7982 Long term (current) use of aspirin: Secondary | ICD-10-CM | POA: Insufficient documentation

## 2019-10-10 DIAGNOSIS — E119 Type 2 diabetes mellitus without complications: Secondary | ICD-10-CM | POA: Insufficient documentation

## 2019-10-10 HISTORY — DX: Cerebral infarction, unspecified: I63.9

## 2019-10-10 LAB — CBC
HCT: 44.9 % (ref 39.0–52.0)
Hemoglobin: 15.8 g/dL (ref 13.0–17.0)
MCH: 33.2 pg (ref 26.0–34.0)
MCHC: 35.2 g/dL (ref 30.0–36.0)
MCV: 94.3 fL (ref 80.0–100.0)
Platelets: 104 10*3/uL — ABNORMAL LOW (ref 150–400)
RBC: 4.76 MIL/uL (ref 4.22–5.81)
RDW: 13.8 % (ref 11.5–15.5)
WBC: 6.7 10*3/uL (ref 4.0–10.5)
nRBC: 0 % (ref 0.0–0.2)

## 2019-10-10 LAB — COMPREHENSIVE METABOLIC PANEL
ALT: 86 U/L — ABNORMAL HIGH (ref 0–44)
AST: 128 U/L — ABNORMAL HIGH (ref 15–41)
Albumin: 3.1 g/dL — ABNORMAL LOW (ref 3.5–5.0)
Alkaline Phosphatase: 179 U/L — ABNORMAL HIGH (ref 38–126)
Anion gap: 7 (ref 5–15)
BUN: 12 mg/dL (ref 6–20)
CO2: 23 mmol/L (ref 22–32)
Calcium: 8.8 mg/dL — ABNORMAL LOW (ref 8.9–10.3)
Chloride: 101 mmol/L (ref 98–111)
Creatinine, Ser: 0.78 mg/dL (ref 0.61–1.24)
GFR calc Af Amer: >60 mL/min (ref >60)
GFR calc non Af Amer: >60 mL/min (ref >60)
Glucose, Bld: 302 mg/dL — ABNORMAL HIGH (ref 70–99)
Potassium: 4.1 mmol/L (ref 3.5–5.1)
Sodium: 131 mmol/L — ABNORMAL LOW (ref 135–145)
Total Bilirubin: 1.9 mg/dL — ABNORMAL HIGH (ref 0.3–1.2)
Total Protein: 7.8 g/dL (ref 6.5–8.1)

## 2019-10-10 LAB — DIFFERENTIAL
Abs Immature Granulocytes: 0.01 10*3/uL (ref 0.00–0.07)
Basophils Absolute: 0.1 10*3/uL (ref 0.0–0.1)
Basophils Relative: 1 %
Eosinophils Absolute: 0.2 10*3/uL (ref 0.0–0.5)
Eosinophils Relative: 2 %
Immature Granulocytes: 0 %
Lymphocytes Relative: 27 %
Lymphs Abs: 1.8 10*3/uL (ref 0.7–4.0)
Monocytes Absolute: 0.8 10*3/uL (ref 0.1–1.0)
Monocytes Relative: 12 %
Neutro Abs: 3.9 10*3/uL (ref 1.7–7.7)
Neutrophils Relative %: 58 %

## 2019-10-10 LAB — PROTIME-INR
INR: 1.1 (ref 0.8–1.2)
Prothrombin Time: 14 seconds (ref 11.4–15.2)

## 2019-10-10 LAB — VITAMIN B12: Vitamin B-12: 699 pg/mL (ref 180–914)

## 2019-10-10 LAB — GLUCOSE, CAPILLARY: Glucose-Capillary: 220 mg/dL — ABNORMAL HIGH (ref 70–99)

## 2019-10-10 LAB — FOLATE: Folate: 9.1 ng/mL (ref >5.9)

## 2019-10-10 LAB — APTT: aPTT: 32 seconds (ref 24–36)

## 2019-10-10 MED ORDER — ASPIRIN 81 MG PO CHEW
324.0000 mg | CHEWABLE_TABLET | Freq: Once | ORAL | Status: AC
  Administered 2019-10-10: 324 mg via ORAL
  Filled 2019-10-10: qty 4

## 2019-10-10 MED ORDER — AMLODIPINE BESYLATE 10 MG PO TABS: 10.0000 mg | ORAL_TABLET | Freq: Every day | ORAL | 1 refills | Status: DC

## 2019-10-10 MED ORDER — METFORMIN HCL 850 MG PO TABS
850.0000 mg | ORAL_TABLET | Freq: Once | ORAL | Status: AC
  Administered 2019-10-10: 850 mg via ORAL
  Filled 2019-10-10: qty 1

## 2019-10-10 MED ORDER — LORAZEPAM 1 MG PO TABS
1.0000 mg | ORAL_TABLET | ORAL | Status: AC
  Administered 2019-10-10: 1 mg via ORAL
  Filled 2019-10-10: qty 1

## 2019-10-10 MED ORDER — AMLODIPINE BESYLATE 5 MG PO TABS
10.0000 mg | ORAL_TABLET | Freq: Once | ORAL | Status: AC
  Administered 2019-10-10: 10 mg via ORAL
  Filled 2019-10-10: qty 2

## 2019-10-10 MED ORDER — SODIUM CHLORIDE 0.9 % IV BOLUS
1000.0000 mL | Freq: Once | INTRAVENOUS | Status: AC
  Administered 2019-10-10: 1000 mL via INTRAVENOUS

## 2019-10-10 MED ORDER — ASPIRIN 325 MG PO TABS: 325.0000 mg | ORAL_TABLET | Freq: Every day | ORAL | 0 refills | Status: DC

## 2019-10-10 MED ORDER — FOLIC ACID 1 MG PO TABS
1.0000 mg | ORAL_TABLET | Freq: Once | ORAL | Status: AC
  Administered 2019-10-10: 1 mg via ORAL
  Filled 2019-10-10: qty 1

## 2019-10-10 MED ORDER — SODIUM CHLORIDE 0.9% FLUSH
3.0000 mL | Freq: Once | INTRAVENOUS | Status: DC
Start: 2019-10-10 — End: 2019-10-10

## 2019-10-10 MED ORDER — THIAMINE HCL 100 MG/ML IJ SOLN
100.0000 mg | Freq: Once | INTRAMUSCULAR | Status: AC
  Administered 2019-10-10: 100 mg via INTRAVENOUS
  Filled 2019-10-10: qty 2

## 2019-10-10 MED ORDER — METFORMIN HCL 850 MG PO TABS: 850.0000 mg | ORAL_TABLET | Freq: Two times a day (BID) | ORAL | 1 refills | Status: DC

## 2019-10-10 NOTE — ED Triage Notes (Addendum)
C/O numbness to bilateral hands and bilateral arm numbness.  STates symptoms started one week ago.  Also c/o dizziness and trouble walking, which started one year ago.  Patient also c/o difficulty urinating x 1 year and has noticed whiteness in his urine x 1 month.  AAOx3.  Skin warm and dry. NAD.  MAE equally and strong.  Ambulates with easy and steady gait.  NAD

## 2019-10-10 NOTE — ED Provider Notes (Signed)
[facility] Emergency Department Provider Note   ____________________________________________   First MD Initiated Contact with Patient 10/10/19 1033     (approximate)  I have reviewed the triage vital signs and the nursing notes.   HISTORY  Chief Complaint Numbness    HPI Johnny Kelley is a 54 y.o. male history of diabetes hypertension stroke  Patient reports that he has had weakness for over a year, but over the last month or so he seems to just be continuously feeling a tingling in both of his hands and also some on his feet but he reports the stuff on his feet is from his diabetes.  His vision is also been worsening over the last several months.  He is not on his medications as he has no access to a doctor, his brother has tried to get him into medical care but he has not yet been successful (nurse Mateo Flow had a nice discussion with the patient regarding care options including medication management clinic and resources that he can follow-up with in the county)  He denies any recent illness no fevers or chills.  He does not eat very well, had a small amount of chicken yesterday he does drink alcohol usually has to have a 24 ounce beer every morning has not had yet today, and normally drinks at least 2 if not more 24 ounce beers daily.  Reports he has been drinking quite heavily since the age of 32 and also has hepatitis  Has had a previous stroke but is not on aspirin any longer because he used to get nosebleeds so he stopped it   Past Medical History:  Diagnosis Date  . Diabetes mellitus without complication ([place])   . Hepatitis C   . Hypertension   . Stroke [facility])     Patient Active Problem List   Diagnosis Date Noted  . CVA (cerebral vascular accident) ([place]) 09/24/2018  . Cellulitis and abscess 02/22/2015  . Hypertension 02/22/2015  . Abscess 02/22/2015  . Abscess of groin, right   . DM w/ coma type II, uncontrolled ([place])     Past  Surgical History:  Procedure Laterality Date  . KNEE SURGERY Right     Prior to Admission medications   Medication Sig Start Date End Date Taking? Authorizing Provider  amLODipine (NORVASC) 10 MG tablet Take 1 tablet (10 mg total) by mouth daily. 10/10/19 12/09/19  Delman Kitten, MD  aspirin 325 MG tablet Take 1 tablet (325 mg total) by mouth daily. 10/10/19   Delman Kitten, MD  atorvastatin (LIPITOR) 40 MG tablet Take 1 tablet (40 mg total) by mouth daily at 6 PM. 09/25/18   Bettey Costa, MD  metFORMIN (GLUCOPHAGE) 850 MG tablet Take 1 tablet (850 mg total) by mouth 2 (two) times daily with a meal. 10/10/19   Delman Kitten, MD  nicotine (NICODERM CQ - DOSED IN MG/24 HOURS) 14 mg/24hr patch Place 1 patch (14 mg total) onto the skin daily. 09/26/18   Bettey Costa, MD    Allergies Patient has no known allergies.  Family History  Problem Relation Age of Onset  . Colon cancer Father   . Diabetes Father     Social History Social History   Tobacco Use  . Smoking status: Current Every Day Smoker    Packs/day: 1.00    Types: Cigarettes  . Smokeless tobacco: Never Used  Substance Use Topics  . Alcohol use: Yes    Comment: 4-5 24 oz beer daily  .  Drug use: Yes    Types: Cocaine, Marijuana    Comment: "occasionally"    Review of Systems Constitutional: No fever/chills Eyes: No visual changes except slowly worsening or dulling of his vision over months. ENT: No sore throat. Cardiovascular: Denies chest pain. Respiratory: Denies shortness of breath. Gastrointestinal: No abdominal pain.   Genitourinary: Negative for dysuria. Musculoskeletal: Negative for back pain. Skin: Negative for rash. Neurological: Negative for headaches or focal weakness.  Does report a progressive tingly feeling in his fingers and hands as well as feet worsening over at least the last month if not few years.  Reports he has poor balance after his previous stroke but no sudden changes or  worsening    ____________________________________________   PHYSICAL EXAM:  VITAL SIGNS: ED Triage Vitals  Enc Vitals Group     BP 10/10/19 0755 (!) 171/87     Pulse Rate 10/10/19 0755 77     Resp 10/10/19 0755 16     Temp 10/10/19 0755 98.9 F (37.2 C)     Temp Source 10/10/19 0755 Oral     SpO2 10/10/19 0755 94 %     Weight 10/10/19 0754 220 lb (99.8 kg)     Height 10/10/19 0754 6\' 1"  (1.854 m)     Head Circumference --      Peak Flow --      Pain Score 10/10/19 0754 0     Pain Loc --      Pain Edu? --      Excl. in [place]? --     Constitutional: Alert and oriented. Well appearing and in no acute distress.  He appears somewhat chronically ill. Eyes: Conjunctivae are slightly injected and mildly jaundiced l. Head: Atraumatic. Nose: No congestion/rhinnorhea. Mouth/Throat: Mucous membranes are moist. Neck: No stridor.  Cardiovascular: Normal rate, regular rhythm. Grossly normal heart sounds.  Good peripheral circulation. Respiratory: Normal respiratory effort.  No retractions. Lungs CTAB. Gastrointestinal: Soft and nontender. No distention. Musculoskeletal: No lower extremity tenderness nor edema. Neurologic:  Normal speech and language. No gross focal neurologic deficits are appreciated.  He has no appreciable objective motor function loss.  Normal extraocular movements.  Normal strength in all extremities.  No muscle wasting.  No pronator drift in any extremity.  Normal smile and equal facial expressions.  He does report slight paresthesias of a tingling feeling when touching over his hands in a somewhat glovelike pattern as well as lower legs Skin:  Skin is warm, dry and intact. No rash noted. Psychiatric: Mood and affect are normal. Speech and behavior are normal.  ____________________________________________   LABS (all labs ordered are listed, but only abnormal results are displayed)  Labs Reviewed  CBC - Abnormal; Notable for the following components:      Result  Value   Platelets 104 (*)    All other components within normal limits  COMPREHENSIVE METABOLIC PANEL - Abnormal; Notable for the following components:   Sodium 131 (*)    Glucose, Bld 302 (*)    Calcium 8.8 (*)    Albumin 3.1 (*)    AST 128 (*)    ALT 86 (*)    Alkaline Phosphatase 179 (*)    Total Bilirubin 1.9 (*)    All other components within normal limits  GLUCOSE, CAPILLARY - Abnormal; Notable for the following components:   Glucose-Capillary 220 (*)    All other components within normal limits  PROTIME-INR  APTT  DIFFERENTIAL  FOLATE  VITAMIN B12  URINALYSIS, COMPLETE (UACMP) WITH  MICROSCOPIC  CBG MONITORING, ED  I-STAT CREATININE, ED   ____________________________________________  EKG  ED ECG REPORT I, Delman Kitten, the attending physician, personally viewed and interpreted this ECG.  Date: 10/10/2019 EKG Time: 810 Rate: 80 Rhythm: normal sinus rhythm QRS Axis: normal Intervals: normal ST/T Wave abnormalities: normal Narrative Interpretation: no evidence of acute ischemia  ____________________________________________  RADIOLOGY  CT HEAD WO CONTRAST  Result Date: 10/10/2019 CLINICAL DATA:  Upper extremity numbness and dizziness EXAM: CT HEAD WITHOUT CONTRAST TECHNIQUE: Contiguous axial images were obtained from the base of the skull through the vertex without intravenous contrast. COMPARISON:  None. FINDINGS: Brain: There is mild diffuse atrophy. There is no intracranial mass, hemorrhage, extra-axial fluid collection, or midline shift. There is patchy small vessel disease in the centra semiovale bilaterally. No acute appearing infarct is appreciable on this study. Vascular: There is no appreciable hyperdense vessel. There is calcification in each carotid siphon region. Skull: The bony calvarium appears intact. Sinuses/Orbits: There is opacification throughout the visualized right maxillary antrum. There is opacification and mucosal thickening in multiple ethmoid  air cells as well as opacification throughout portions of the right sphenoid sinus. Orbits appear symmetric bilaterally. Other: Visualized mastoid air cells are clear. IMPRESSION: Mild atrophy with patchy periventricular small vessel disease. No acute appearing infarct evident. No mass or hemorrhage. Foci of arterial vascular calcification noted. Multiple foci of paranasal sinus disease. Electronically Signed   By: Lowella Grip III M.D.   On: 10/10/2019 08:16   MR BRAIN WO CONTRAST  Result Date: 10/10/2019 CLINICAL DATA:  Bilateral upper extremity numbness for 1 week. Dizziness. EXAM: MRI HEAD WITHOUT CONTRAST TECHNIQUE: Multiplanar, multiecho pulse sequences of the brain and surrounding structures were obtained without intravenous contrast. COMPARISON:  Head CT 10/10/2019 and MRI 09/24/2018 FINDINGS: The study is mildly motion degraded. Brain: No acute infarct, mass, midline shift, or extra-axial fluid collection is identified. Multiple chronic cerebral microhemorrhages are similar to the prior MRI, and there are also an unchanged 1.5 cm chronic hemorrhage in the right occipital lobe and a chronic hemorrhagic infarct in the right basal ganglia. Right-sided wallerian degeneration is again noted in the brainstem. Patchy T2 hyperintensities in the cerebral white matter bilaterally are similar to the prior MRI and are nonspecific but compatible with moderately extensive chronic small vessel ischemic disease. Chronic lacunar infarcts are again noted in the bilateral basal ganglia, right thalamus, and bilateral cerebral white matter. There is mildly age advanced cerebral atrophy. Vascular: Major intracranial vascular flow voids are preserved. Skull and upper cervical spine: Unremarkable bone marrow signal. Sinuses/Orbits: Unremarkable orbits. Chronic right-sided maxillary, ethmoid, and sphenoid sinusitis including chronic complete opacification of the right maxillary sinus. Trace right mastoid fluid. Other:  None. IMPRESSION: 1. No acute intracranial abnormality. 2. Advanced chronic small vessel ischemic disease with multiple chronic lacunar infarcts and chronic microhemorrhages as above. Electronically Signed   By: Logan Bores M.D.   On: 10/10/2019 11:58   US ABDOMEN LIMITED RUQ  Result Date: 10/10/2019 CLINICAL DATA:  Transaminitis. EXAM: ULTRASOUND ABDOMEN LIMITED RIGHT UPPER QUADRANT COMPARISON:  February 28, 2008. FINDINGS: Gallbladder: No gallstones or wall thickening visualized. No sonographic Murphy sign noted by sonographer. Common bile duct: Diameter: 5 mm which is within normal limits. Liver: There is interval development of heterogeneous rounded abnormality in the right hepatic lobe measuring 4.1 x 4.1 x 3.5 cm. Increased echogenicity of hepatic parenchyma is noted suggesting hepatic steatosis or other diffuse hepatocellular disease. Portal vein is patent on color Doppler imaging with normal direction  of blood flow towards the liver. Other: None. IMPRESSION: 4.1 cm heterogeneous rounded abnormality seen in the right hepatic lobe concerning for possible mass or neoplasm. Further evaluation with MRI is recommended. Increased echogenicity of hepatic parenchyma is noted suggesting hepatic steatosis or other diffuse hepatocellular disease. Electronically Signed   By: Marijo Conception M.D.   On: 10/10/2019 11:22     MRI reviewed no acute infarcts. Right upper quadrant ultrasound concerning for possible mass or cancer. Discussed with the patient and strongly recommended he make close follow-up with a gastroenterologist, and he is understanding of this. Additionally I sent a referral to gastroenterology to Dr. Georgeann [facility] for urgent follow-up as well ____________________________________________   PROCEDURES  Procedure(s) performed: None  Procedures  Critical Care performed: No  ____________________________________________   INITIAL IMPRESSION / ASSESSMENT AND PLAN / ED COURSE  Pertinent  labs & imaging results that were available during my care of the patient were reviewed by me and considered in my medical decision making (see chart for details).   Patient presents with slowly worsening bilateral paresthesia and fatigue.  I am concerned that this may be related in part to his alcohol abuse, he readily admits to that and drinks quite heavily.  Vitamin or nutritional deficiencies, dehydration, development of cirrhosis, diabetic symptoms including neuropathy all seem to fit a potential clinical picture for him.  I will obtain MRI of the brain to exclude stroke as he has a history of previous with a somewhat similar presentation, though today I do not find hard finding of an acute stroke.  He is well outside of any TPA window with a last known well time reports at least a week or month if not more.  He is alert well oriented.  No abdominal pain.  Check ultrasound to evaluate for possible signs of developing cirrhosis and the need for urgent follow-ups  No cardiac or pulmonary symptoms.  Denies infectious symptoms.   ----------------------------------------- 1:17 PM on 10/10/2019 -----------------------------------------  Patient reports he feels better. Has been up walk to the bathroom no difficulty. He does not have any his medications at home, will represcribe his aspirin, amlodipine, Metformin.   Patient understanding of plan for follow-up, need to follow with gastroenterology. Counseled on alcohol use and concerns around that, patient not currently interested in alcohol detox or counseling. Also discussed that he should restart his aspirin regimen for stroke prevention.  Return precautions and treatment recommendations and follow-up discussed with the patient who is agreeable with the plan.      ____________________________________________   FINAL CLINICAL IMPRESSION(S) / ED DIAGNOSES  Final diagnoses:  Transaminitis  Liver mass  Hepatic disease  Paresthesia         Note:  This document was prepared using Dragon voice recognition software and may include unintentional dictation errors       Delman Kitten, MD 10/10/19 1319

## 2019-10-10 NOTE — Discharge Instructions (Signed)
Please follow-up closely with gastroenterology and set up the primary care doctor.  Have made some suggestions for you to go to potentially [facility]  Follow-up with Dr. Vicente Males is very important  Get help right away if you: Vomit bright red blood or a material that looks like coffee grounds. Develop a fever Have numbness or weakness arm an arm, leg, or face or difficulty speaking Have blood in your stools. Notice that your stools appear black and tarry. Become confused. Have chest pain or trouble breathing.

## 2019-10-10 NOTE — ED Notes (Signed)
Pt transported to MRI 

## 2019-10-10 NOTE — ED Notes (Signed)
Last drink last night. Pt drinks at least two 24 oz beers per day.  Has had numbness to both hands as well as dizziness for a "while".  Feels off balance because of dizziness. Has stopped taking all meds including DM meds and plavix/ASA

## 2019-10-10 NOTE — ED Provider Notes (Signed)
Patient noted to nurse at about time of discharge that he has noticed at times that his urine seems abnormal.  Offered to perform urinalysis to check for urinary tract infection or other cause, but patient reports that he is ready to go and does not wish to stay for additional testing.  Return precautions have been advised to the patient   Delman Kitten, MD 10/10/19 1344

## 2019-10-10 NOTE — ED Notes (Signed)
US at bedside

## 2019-10-10 NOTE — ED Notes (Signed)
Pt given meal tray.

## 2019-10-19 ENCOUNTER — Other Ambulatory Visit: Payer: Self-pay

## 2019-10-19 ENCOUNTER — Telehealth: Payer: Self-pay | Admitting: Pharmacy Technician

## 2019-10-19 ENCOUNTER — Ambulatory Visit: Payer: Self-pay | Admitting: Pharmacy Technician

## 2019-10-19 DIAGNOSIS — Z79899 Other long term (current) drug therapy: Secondary | ICD-10-CM

## 2019-10-19 NOTE — Telephone Encounter (Signed)
Made referrals for housing and transportation to [facility].  [facility]

## 2019-10-19 NOTE — Progress Notes (Signed)
Met with patient completed financial assistance application for Johnny Kelley due to recent hospital visit.  Patient agreed to be responsible for gathering financial information and forwarding to appropriate department in [facility].    Completed [facility] application and contract.  Patient agreed to all terms of the [facility] contract.    Patient approved to receive medication assistance at [facility] until time for re-certification in [id number], and as long as eligibility criteria continues to be met.    Provided patient with Civil engineer, contracting based on his particular needs.    Referred patient to [facility].  Referred patient for MTM.  [facility]

## 2019-10-27 ENCOUNTER — Encounter: Payer: Self-pay | Admitting: Gerontology

## 2019-10-27 ENCOUNTER — Ambulatory Visit: Payer: Self-pay | Admitting: Gerontology

## 2019-10-27 VITALS — BP 130/71 | HR 79 | Ht 73.0 in | Wt 210.0 lb

## 2019-10-27 DIAGNOSIS — F1010 Alcohol abuse, uncomplicated: Secondary | ICD-10-CM

## 2019-10-27 DIAGNOSIS — R399 Unspecified symptoms and signs involving the genitourinary system: Secondary | ICD-10-CM | POA: Insufficient documentation

## 2019-10-27 DIAGNOSIS — Z7689 Persons encountering health services in other specified circumstances: Secondary | ICD-10-CM

## 2019-10-27 DIAGNOSIS — R935 Abnormal findings on diagnostic imaging of other abdominal regions, including retroperitoneum: Secondary | ICD-10-CM

## 2019-10-27 DIAGNOSIS — E1169 Type 2 diabetes mellitus with other specified complication: Secondary | ICD-10-CM | POA: Insufficient documentation

## 2019-10-27 DIAGNOSIS — E1140 Type 2 diabetes mellitus with diabetic neuropathy, unspecified: Secondary | ICD-10-CM

## 2019-10-27 DIAGNOSIS — I10 Essential (primary) hypertension: Secondary | ICD-10-CM

## 2019-10-27 DIAGNOSIS — Z789 Other specified health status: Secondary | ICD-10-CM

## 2019-10-27 DIAGNOSIS — E7800 Pure hypercholesterolemia, unspecified: Secondary | ICD-10-CM

## 2019-10-27 DIAGNOSIS — E785 Hyperlipidemia, unspecified: Secondary | ICD-10-CM

## 2019-10-27 LAB — POCT GLYCOSYLATED HEMOGLOBIN (HGB A1C): Hemoglobin A1C: 8.8 % — AB (ref 4.0–5.6)

## 2019-10-27 MED ORDER — ATORVASTATIN CALCIUM 40 MG PO TABS: 40.0000 mg | ORAL_TABLET | Freq: Every day | ORAL | 0 refills | Status: DC

## 2019-10-27 MED ORDER — AMLODIPINE BESYLATE 10 MG PO TABS: 10.0000 mg | ORAL_TABLET | Freq: Every day | ORAL | 1 refills | Status: DC

## 2019-10-27 MED ORDER — METFORMIN HCL 1000 MG PO TABS: 1000.0000 mg | ORAL_TABLET | Freq: Two times a day (BID) | ORAL | 0 refills | Status: DC

## 2019-10-27 MED ORDER — GABAPENTIN 100 MG PO CAPS
100.0000 mg | ORAL_CAPSULE | Freq: Every day | ORAL | 0 refills | Status: DC
Start: 2019-10-27 — End: 2019-11-10

## 2019-10-27 MED ORDER — BLOOD GLUCOSE MONITOR KIT: PACK | 0 refills | Status: DC

## 2019-10-27 NOTE — Progress Notes (Signed)
New Patient Office Visit  Subjective:  Patient ID: Johnny Kelley, male    DOB: Apr 02, 1965  Age: 54 y.o. MRN: [id number]  CC:  Chief Complaint  Patient presents with  . Establish Care   HPI  Johnny Kelley is a 54 y.o male who  presents for establishment of care and evaluation of his chronic conditions. He  has a history of hypertension, and type 2 diabetes. He reports being compliant with his medication. He complained of not getting enough sleep due to polyuria. He reports voiding every hour and states that he doesn't feel like he is emptying his bladder completely. In addition, he complained of thick whitish discharge with urination which started about 3 months and has been on-going. He denies dysuria, hematuria, flank pain, fever, and chills. He was discharged from the hospital on 10/10/2019 with complaint of numbness and tingling of his extremities and urinary issues. Per Dr. Delman Kitten, he declined an offer for urinalysis stating that he doesn't wish to under additional testing. He has a history of type 2  Diabetes and takes 850 mg Metformin BID. He states that he doesn't check his blood glucose due to lack of glucometer. His recent HgbA1C done 10/27/19 was 8.8%. His POCT glucose was 239 mg/dL. Though, he denies experiencing hypoglycemic symptoms, he endorsed  Polyuria, blurry vision, and peripheral neuropathy. He verbalized smoking a pack of cigarette every other day and verbalized no intention of quitting. He reports drinking two 22 ounces of beer every other day and states that he is working on cutting back. His AST done on 10/10/2019 was 128 U/L, and ALT was 86 U/L. His alkaline phosphatase was 179 U/L. The abdominal ultra sound done 10/10/2019  showed  4.1 cm heterogeneous rounded abnormality seen in the right hepatic lobe concerning for possible mass or neoplasm. Further evaluation with MRI is recommended. Increased echogenicity of hepatic parenchyma is noted suggesting hepatic  steatosis or other diffuse hepatocellular disease, per Dr. Marijo Conception. He will follow up with Gastroenterologist  Dr. Allen Norris on 12/14/2019. He denies right upper quadrant pain, denies jaundice. He has a history of hypertension and take 28m Amlodipine. He states that he doesn't check his blood pressure, and denies peripheral edema. He is currently homeless and lives in an uncompleted building. Overall, he states that he is doing well and offers no additional complaint at this time.     Past Medical History:  Diagnosis Date  . Diabetes mellitus without complication ([place]   . Hepatitis C   . Hypertension   . Stroke ([facility]     Past Surgical History:  Procedure Laterality Date  . KNEE SURGERY Right     Family History  Problem Relation Age of Onset  . Colon cancer Father   . Diabetes Father     Social History   Socioeconomic History  . Marital status: Widowed    Spouse name: Not on file  . Number of children: Not on file  . Years of education: Not on file  . Highest education level: Not on file  Occupational History  . Occupation: unemployed  Tobacco Use  . Smoking status: Current Every Day Smoker    Packs/day: 1.00    Types: Cigarettes  . Smokeless tobacco: Never Used  Vaping Use  . Vaping Use: Never used  Substance and Sexual Activity  . Alcohol use: Yes    Comment: 4-5 24 oz beer daily  . Drug use: Not Currently    Types: Cocaine, Marijuana  Comment: "occasionally"  . Sexual activity: Not on file  Other Topics Concern  . Not on file  Social History Narrative  . Not on file   Social Determinants of Health   Financial Resource Strain:   . Difficulty of Paying Living Expenses:   Food Insecurity:   . Worried About Charity fundraiser in the Last Year:   . Arboriculturist in the Last Year:   Transportation Needs:   . Film/video editor (Medical):   Marland Kitchen Lack of Transportation (Non-Medical):   Physical Activity:   . Days of Exercise per Week:   . Minutes of  Exercise per Session:   Stress:   . Feeling of Stress :   Social Connections:   . Frequency of Communication with Friends and Family:   . Frequency of Social Gatherings with Friends and Family:   . Attends Religious Services:   . Active Member of Clubs or Organizations:   . Attends Archivist Meetings:   Marland Kitchen Marital Status:   Intimate Partner Violence:   . Fear of Current or Ex-Partner:   . Emotionally Abused:   Marland Kitchen Physically Abused:   . Sexually Abused:     ROS Review of Systems  Constitutional: Negative.   HENT: Negative.   Eyes: Positive for visual disturbance (.Blurry vision ).  Respiratory: Negative.   Cardiovascular: Negative.   Gastrointestinal: Negative.   Endocrine: Positive for polyuria.  Genitourinary: Positive for discharge (whitish discharge ).  Musculoskeletal: Negative.   Skin: Negative.   Allergic/Immunologic: Negative.   Neurological: Positive for numbness (peripheral neuropathy ).  Hematological: Negative.   Psychiatric/Behavioral: Negative.     Objective:   Today's Vitals: BP (!) 130/71 (BP Location: Right Arm, Patient Position: Sitting)   Pulse 79   Ht '6\' 1"'  (1.854 m)   Wt (!) 210 lb (95.3 kg)   SpO2 95%   BMI 27.71 kg/m   Physical Exam Constitutional:      Appearance: Normal appearance.  HENT:     Head: Normocephalic and atraumatic.  Cardiovascular:     Rate and Rhythm: Normal rate and regular rhythm.     Pulses: Normal pulses.     Heart sounds: Normal heart sounds.  Pulmonary:     Effort: Pulmonary effort is normal.     Breath sounds: Normal breath sounds.  Abdominal:     General: Bowel sounds are normal.  Neurological:     General: No focal deficit present.     Mental Status: He is alert and oriented to person, place, and time.  Psychiatric:        Mood and Affect: Mood normal.        Behavior: Behavior normal.        Thought Content: Thought content normal.        Judgment: Judgment normal.     Outpatient Encounter  Medications as of 10/27/2019  Medication Sig  . amLODipine (NORVASC) 10 MG tablet Take 1 tablet (10 mg total) by mouth daily.  Marland Kitchen aspirin 325 MG tablet Take 1 tablet (325 mg total) by mouth daily.  . metFORMIN (GLUCOPHAGE) 1000 MG tablet Take 1 tablet (1,000 mg total) by mouth 2 (two) times daily with a meal.  . [DISCONTINUED] amLODipine (NORVASC) 10 MG tablet Take 1 tablet (10 mg total) by mouth daily.  . [DISCONTINUED] metFORMIN (GLUCOPHAGE) 850 MG tablet Take 1 tablet (850 mg total) by mouth 2 (two) times daily with a meal.  . blood glucose meter kit and supplies  KIT Dispense based on patient and insurance preference. Use up to four times daily as directed. (FOR ICD-9 250.00, 250.01).  Marland Kitchen gabapentin (NEURONTIN) 100 MG capsule Take 1 capsule (100 mg total) by mouth at bedtime.  . nicotine (NICODERM CQ - DOSED IN MG/24 HOURS) 14 mg/24hr patch Place 1 patch (14 mg total) onto the skin daily.  . [DISCONTINUED] atorvastatin (LIPITOR) 40 MG tablet Take 1 tablet (40 mg total) by mouth daily at 6 PM. (Patient not taking: Reported on 10/27/2019)  . [DISCONTINUED] atorvastatin (LIPITOR) 40 MG tablet Take 1 tablet (40 mg total) by mouth daily at 6 PM.   No facility-administered encounter medications on file as of 10/27/2019.   Assessment & Plan  1. Type 2 diabetes mellitus with diabetic neuropathy, without long-term current use of insulin (HCC)  His diabetes is not under control. His Metformin was increased to 1000 mg twice daily. His HgbA1C was 8.8%, and his goal should be less than 7%. - metFORMIN (GLUCOPHAGE) 1000 MG tablet; Take 1 tablet (1,000 mg total) by mouth 2 (two) times daily with a meal.  Dispense: 60 tablet; Refill: 0 - POCT HgB A1C - blood glucose meter kit and supplies KIT; Dispense based on patient and insurance preference. Use up to four times daily as directed. (FOR ICD-9 250.00, 250.01).  Dispense: 1 each; Refill: 0 - Urine Microalbumin w/creat. ratio; Future He was advised to check his  blood glucose twice a day and his fasting blood glucose goal should be between 80 and 130 mg/dL.  He was advised to continue on low carb/non concentrated sweet diet, and exercise as tolerated.  2. Essential hypertension His blood pressure is under control, and he will continue current treatment regimen  - amLODipine (NORVASC) 10 MG tablet; Take 1 tablet (10 mg total) by mouth daily.  Dispense: 30 tablet; Refill: 1 He was advised to start DASH diet and exercise as tolerated   3. Hypercholesterolemia He was advised to continue low fat/cholesterol diet and exercise as tolerated  - Lipid panel; Future  4. Abnormal abdominal ultrasound He was advised to follow up with Gastroenterologist   5. Alcohol abuse He was advised to abstain from alcohol   6. Encounter to establish care Initial assessment was completed. He will continue to follow up with provider at the clinic   7. Urinary tract infection symptoms Routine labs will be completed - Urinalysis; Future - UA/M w/rflx Culture, Routine; Future - PSA; Future  8. Needs assistance with community resources He will follow up with Ms. Heather simpson    Follow-up: Return in about 2 weeks (around 11/10/2019), or if symptoms worsen or fail to improve.   Carney Corners, RN

## 2019-10-27 NOTE — Patient Instructions (Signed)
DASH Eating Plan DASH stands for "Dietary Approaches to Stop Hypertension." The DASH eating plan is a healthy eating plan that has been shown to reduce high blood pressure (hypertension). It may also reduce your risk for type 2 diabetes, heart disease, and stroke. The DASH eating plan may also help with weight loss. What are tips for following this plan?  General guidelines  Avoid eating more than 2,300 mg (milligrams) of salt (sodium) a day. If you have hypertension, you may need to reduce your sodium intake to 1,500 mg a day.  Limit alcohol intake to no more than 1 drink a day for nonpregnant women and 2 drinks a day for men. One drink equals 12 oz of beer, 5 oz of wine, or 1 oz of hard liquor.  Work with your health care provider to maintain a healthy body weight or to lose weight. Ask what an ideal weight is for you.  Get at least 30 minutes of exercise that causes your heart to beat faster (aerobic exercise) most days of the week. Activities may include walking, swimming, or biking.  Work with your health care provider or diet and nutrition specialist (dietitian) to adjust your eating plan to your individual calorie needs. Reading food labels   Check food labels for the amount of sodium per serving. Choose foods with less than 5 percent of the Daily Value of sodium. Generally, foods with less than 300 mg of sodium per serving fit into this eating plan.  To find whole grains, look for the word "whole" as the first word in the ingredient list. Shopping  Buy products labeled as "low-sodium" or "no salt added."  Buy fresh foods. Avoid canned foods and premade or frozen meals. Cooking  Avoid adding salt when cooking. Use salt-free seasonings or herbs instead of table salt or sea salt. Check with your health care provider or pharmacist before using salt substitutes.  Do not fry foods. Cook foods using healthy methods such as baking, boiling, grilling, and broiling instead.  Cook with  heart-healthy oils, such as olive, canola, soybean, or sunflower oil. Meal planning  Eat a balanced diet that includes: ? 5 or more servings of fruits and vegetables each day. At each meal, try to fill half of your plate with fruits and vegetables. ? Up to 6-8 servings of whole grains each day. ? Less than 6 oz of lean meat, poultry, or fish each day. A 3-oz serving of meat is about the same size as a deck of cards. One egg equals 1 oz. ? 2 servings of low-fat dairy each day. ? A serving of nuts, seeds, or beans 5 times each week. ? Heart-healthy fats. Healthy fats called Omega-3 fatty acids are found in foods such as flaxseeds and coldwater fish, like sardines, salmon, and mackerel.  Limit how much you eat of the following: ? Canned or prepackaged foods. ? Food that is high in trans fat, such as fried foods. ? Food that is high in saturated fat, such as fatty meat. ? Sweets, desserts, sugary drinks, and other foods with added sugar. ? Full-fat dairy products.  Do not salt foods before eating.  Try to eat at least 2 vegetarian meals each week.  Eat more home-cooked food and less restaurant, buffet, and fast food.  When eating at a restaurant, ask that your food be prepared with less salt or no salt, if possible. What foods are recommended? The items listed may not be a complete list. Talk with your dietitian about   what dietary choices are best for you. Grains Whole-grain or whole-wheat bread. Whole-grain or whole-wheat pasta. Brown rice. Oatmeal. Quinoa. Bulgur. Whole-grain and low-sodium cereals. Pita bread. Low-fat, low-sodium crackers. Whole-wheat flour tortillas. Vegetables Fresh or frozen vegetables (raw, steamed, roasted, or grilled). Low-sodium or reduced-sodium tomato and vegetable juice. Low-sodium or reduced-sodium tomato sauce and tomato paste. Low-sodium or reduced-sodium canned vegetables. Fruits All fresh, dried, or frozen fruit. Canned fruit in natural juice (without  added sugar). Meat and other protein foods Skinless chicken or turkey. Ground chicken or turkey. Pork with fat trimmed off. Fish and seafood. Egg whites. Dried beans, peas, or lentils. Unsalted nuts, nut butters, and seeds. Unsalted canned beans. Lean cuts of beef with fat trimmed off. Low-sodium, lean deli meat. Dairy Low-fat (1%) or fat-free (skim) milk. Fat-free, low-fat, or reduced-fat cheeses. Nonfat, low-sodium ricotta or cottage cheese. Low-fat or nonfat yogurt. Low-fat, low-sodium cheese. Fats and oils Soft margarine without trans fats. Vegetable oil. Low-fat, reduced-fat, or light mayonnaise and salad dressings (reduced-sodium). Canola, safflower, olive, soybean, and sunflower oils. Avocado. Seasoning and other foods Herbs. Spices. Seasoning mixes without salt. Unsalted popcorn and pretzels. Fat-free sweets. What foods are not recommended? The items listed may not be a complete list. Talk with your dietitian about what dietary choices are best for you. Grains Baked goods made with fat, such as croissants, muffins, or some breads. Dry pasta or rice meal packs. Vegetables Creamed or fried vegetables. Vegetables in a cheese sauce. Regular canned vegetables (not low-sodium or reduced-sodium). Regular canned tomato sauce and paste (not low-sodium or reduced-sodium). Regular tomato and vegetable juice (not low-sodium or reduced-sodium). Pickles. Olives. Fruits Canned fruit in a light or heavy syrup. Fried fruit. Fruit in cream or butter sauce. Meat and other protein foods Fatty cuts of meat. Ribs. Fried meat. Bacon. Sausage. Bologna and other processed lunch meats. Salami. Fatback. Hotdogs. Bratwurst. Salted nuts and seeds. Canned beans with added salt. Canned or smoked fish. Whole eggs or egg yolks. Chicken or turkey with skin. Dairy Whole or 2% milk, cream, and half-and-half. Whole or full-fat cream cheese. Whole-fat or sweetened yogurt. Full-fat cheese. Nondairy creamers. Whipped toppings.  Processed cheese and cheese spreads. Fats and oils Butter. Stick margarine. Lard. Shortening. Ghee. Bacon fat. Tropical oils, such as coconut, palm kernel, or palm oil. Seasoning and other foods Salted popcorn and pretzels. Onion salt, garlic salt, seasoned salt, table salt, and sea salt. Worcestershire sauce. Tartar sauce. Barbecue sauce. Teriyaki sauce. Soy sauce, including reduced-sodium. Steak sauce. Canned and packaged gravies. Fish sauce. Oyster sauce. Cocktail sauce. Horseradish that you find on the shelf. Ketchup. Mustard. Meat flavorings and tenderizers. Bouillon cubes. Hot sauce and Tabasco sauce. Premade or packaged marinades. Premade or packaged taco seasonings. Relishes. Regular salad dressings. Where to find more information:  National Heart, Lung, and Blood Institute: www.nhlbi.nih.gov  American Heart Association: www.heart.org Summary  The DASH eating plan is a healthy eating plan that has been shown to reduce high blood pressure (hypertension). It may also reduce your risk for type 2 diabetes, heart disease, and stroke.  With the DASH eating plan, you should limit salt (sodium) intake to 2,300 mg a day. If you have hypertension, you may need to reduce your sodium intake to 1,500 mg a day.  When on the DASH eating plan, aim to eat more fresh fruits and vegetables, whole grains, lean proteins, low-fat dairy, and heart-healthy fats.  Work with your health care provider or diet and nutrition specialist (dietitian) to adjust your eating plan to your   individual calorie needs. This information is not intended to replace advice given to you by your health care provider. Make sure you discuss any questions you have with your health care provider. Document Revised: 02/27/2017 Document Reviewed: 03/10/2016 Elsevier Patient Education  2020 Elsevier Inc. Carbohydrate Counting for Diabetes Mellitus, Adult  Carbohydrate counting is a method of keeping track of how many carbohydrates you  eat. Eating carbohydrates naturally increases the amount of sugar (glucose) in the blood. Counting how many carbohydrates you eat helps keep your blood glucose within normal limits, which helps you manage your diabetes (diabetes mellitus). It is important to know how many carbohydrates you can safely have in each meal. This is different for every person. A diet and nutrition specialist (registered dietitian) can help you make a meal plan and calculate how many carbohydrates you should have at each meal and snack. Carbohydrates are found in the following foods:  Grains, such as breads and cereals.  Dried beans and soy products.  Starchy vegetables, such as potatoes, peas, and corn.  Fruit and fruit juices.  Milk and yogurt.  Sweets and snack foods, such as cake, cookies, candy, chips, and soft drinks. How do I count carbohydrates? There are two ways to count carbohydrates in food. You can use either of the methods or a combination of both. Reading "Nutrition Facts" on packaged food The "Nutrition Facts" list is included on the labels of almost all packaged foods and beverages in the [place] It includes:  The serving size.  Information about nutrients in each serving, including the grams (g) of carbohydrate per serving. To use the "Nutrition Facts":  Decide how many servings you will have.  Multiply the number of servings by the number of carbohydrates per serving.  The resulting number is the total amount of carbohydrates that you will be having. Learning standard serving sizes of other foods When you eat carbohydrate foods that are not packaged or do not include "Nutrition Facts" on the label, you need to measure the servings in order to count the amount of carbohydrates:  Measure the foods that you will eat with a food scale or measuring cup, if needed.  Decide how many standard-size servings you will eat.  Multiply the number of servings by 15. Most carbohydrate-rich foods have  about 15 g of carbohydrates per serving. ? For example, if you eat 8 oz (170 g) of strawberries, you will have eaten 2 servings and 30 g of carbohydrates (2 servings x 15 g = 30 g).  For foods that have more than one food mixed, such as soups and casseroles, you must count the carbohydrates in each food that is included. The following list contains standard serving sizes of common carbohydrate-rich foods. Each of these servings has about 15 g of carbohydrates:   hamburger bun or  English muffin.   oz (15 mL) syrup.   oz (14 g) jelly.  1 slice of bread.  1 six-inch tortilla.  3 oz (85 g) cooked rice or pasta.  4 oz (113 g) cooked dried beans.  4 oz (113 g) starchy vegetable, such as peas, corn, or potatoes.  4 oz (113 g) hot cereal.  4 oz (113 g) mashed potatoes or  of a large baked potato.  4 oz (113 g) canned or frozen fruit.  4 oz (120 mL) fruit juice.  4-6 crackers.  6 chicken nuggets.  6 oz (170 g) unsweetened dry cereal.  6 oz (170 g) plain fat-free yogurt or yogurt sweetened with   artificial sweeteners.  8 oz (240 mL) milk.  8 oz (170 g) fresh fruit or one small piece of fruit.  24 oz (680 g) popped popcorn. Example of carbohydrate counting Sample meal  3 oz (85 g) chicken breast.  6 oz (170 g) brown rice.  4 oz (113 g) corn.  8 oz (240 mL) milk.  8 oz (170 g) strawberries with sugar-free whipped topping. Carbohydrate calculation 1. Identify the foods that contain carbohydrates: ? Rice. ? Corn. ? Milk. ? Strawberries. 2. Calculate how many servings you have of each food: ? 2 servings rice. ? 1 serving corn. ? 1 serving milk. ? 1 serving strawberries. 3. Multiply each number of servings by 15 g: ? 2 servings rice x 15 g = 30 g. ? 1 serving corn x 15 g = 15 g. ? 1 serving milk x 15 g = 15 g. ? 1 serving strawberries x 15 g = 15 g. 4. Add together all of the amounts to find the total grams of carbohydrates eaten: ? 30 g + 15 g + 15 g + 15  g = 75 g of carbohydrates total. Summary  Carbohydrate counting is a method of keeping track of how many carbohydrates you eat.  Eating carbohydrates naturally increases the amount of sugar (glucose) in the blood.  Counting how many carbohydrates you eat helps keep your blood glucose within normal limits, which helps you manage your diabetes.  A diet and nutrition specialist (registered dietitian) can help you make a meal plan and calculate how many carbohydrates you should have at each meal and snack. This information is not intended to replace advice given to you by your health care provider. Make sure you discuss any questions you have with your health care provider. Document Revised: 10/09/2016 Document Reviewed: 08/29/2015 Elsevier Patient Education  2020 Elsevier Inc.  

## 2019-11-02 ENCOUNTER — Other Ambulatory Visit: Payer: Self-pay

## 2019-11-02 DIAGNOSIS — R399 Unspecified symptoms and signs involving the genitourinary system: Secondary | ICD-10-CM

## 2019-11-02 DIAGNOSIS — E7800 Pure hypercholesterolemia, unspecified: Secondary | ICD-10-CM

## 2019-11-02 DIAGNOSIS — E1140 Type 2 diabetes mellitus with diabetic neuropathy, unspecified: Secondary | ICD-10-CM

## 2019-11-03 ENCOUNTER — Telehealth: Payer: Self-pay | Admitting: Licensed Clinical Social Worker

## 2019-11-03 ENCOUNTER — Institutional Professional Consult (permissible substitution): Payer: Self-pay | Admitting: Licensed Clinical Social Worker

## 2019-11-03 ENCOUNTER — Other Ambulatory Visit: Payer: Self-pay | Admitting: Gerontology

## 2019-11-03 DIAGNOSIS — N390 Urinary tract infection, site not specified: Secondary | ICD-10-CM

## 2019-11-03 MED ORDER — NITROFURANTOIN MONOHYD MACRO 100 MG PO CAPS: 100.0000 mg | ORAL_CAPSULE | Freq: Two times a day (BID) | ORAL | 0 refills | Status: DC

## 2019-11-03 NOTE — Telephone Encounter (Signed)
Clinician reached out to patient to tell him his 11 am appointment for resources was scheduled in error and informed that the appointment would be cancelled for today November 03, 2019. She informed the patient of his next follow up appointment in clinic on August 12th @ 3 pm.

## 2019-11-06 LAB — LIPID PANEL
Chol/HDL Ratio: 2.3 ratio (ref 0.0–5.0)
Cholesterol, Total: 131 mg/dL (ref 100–199)
HDL: 56 mg/dL (ref >39)
LDL Chol Calc (NIH): 59 mg/dL (ref 0–99)
Triglycerides: 85 mg/dL (ref 0–149)
VLDL Cholesterol Cal: 16 mg/dL (ref 5–40)

## 2019-11-06 LAB — UA/M W/RFLX CULTURE, ROUTINE
Bilirubin, UA: NEGATIVE
Nitrite, UA: POSITIVE — AB
RBC, UA: NEGATIVE
Specific Gravity, UA: 1.023 (ref 1.005–1.030)
Urobilinogen, Ur: 1 mg/dL (ref 0.2–1.0)
pH, UA: 5 (ref 5.0–7.5)

## 2019-11-06 LAB — MICROALBUMIN / CREATININE URINE RATIO
Creatinine, Urine: 170.9 mg/dL
Microalb/Creat Ratio: 31 mg/g creat — ABNORMAL HIGH (ref 0–29)
Microalbumin, Urine: 52.9 ug/mL

## 2019-11-06 LAB — URINE CULTURE, REFLEX

## 2019-11-06 LAB — MICROSCOPIC EXAMINATION

## 2019-11-06 LAB — PSA: Prostate Specific Ag, Serum: 0.2 ng/mL (ref 0.0–4.0)

## 2019-11-10 ENCOUNTER — Other Ambulatory Visit: Payer: Self-pay

## 2019-11-10 ENCOUNTER — Ambulatory Visit: Payer: Self-pay | Admitting: Gerontology

## 2019-11-10 VITALS — BP 150/78 | HR 73 | Wt 214.4 lb

## 2019-11-10 DIAGNOSIS — L409 Psoriasis, unspecified: Secondary | ICD-10-CM | POA: Insufficient documentation

## 2019-11-10 DIAGNOSIS — E1140 Type 2 diabetes mellitus with diabetic neuropathy, unspecified: Secondary | ICD-10-CM

## 2019-11-10 DIAGNOSIS — N390 Urinary tract infection, site not specified: Secondary | ICD-10-CM

## 2019-11-10 DIAGNOSIS — I10 Essential (primary) hypertension: Secondary | ICD-10-CM

## 2019-11-10 MED ORDER — GLIPIZIDE 5 MG PO TABS: 2.5000 mg | ORAL_TABLET | Freq: Every day | ORAL | 0 refills | Status: DC

## 2019-11-10 MED ORDER — TRIAMCINOLONE ACETONIDE 0.1 % EX CREA: 1.0000 "application " | TOPICAL_CREAM | Freq: Two times a day (BID) | CUTANEOUS | 1 refills | Status: DC

## 2019-11-10 MED ORDER — GABAPENTIN 100 MG PO CAPS: 100.0000 mg | ORAL_CAPSULE | Freq: Every day | ORAL | 0 refills | Status: DC

## 2019-11-10 MED ORDER — ASPIRIN 325 MG PO TABS: 325.0000 mg | ORAL_TABLET | Freq: Every day | ORAL | 0 refills | Status: DC

## 2019-11-10 MED ORDER — METFORMIN HCL 1000 MG PO TABS: 1000.0000 mg | ORAL_TABLET | Freq: Two times a day (BID) | ORAL | 0 refills | Status: DC

## 2019-11-10 NOTE — Patient Instructions (Signed)
DASH Eating Plan DASH stands for "Dietary Approaches to Stop Hypertension." The DASH eating plan is a healthy eating plan that has been shown to reduce high blood pressure (hypertension). It may also reduce your risk for type 2 diabetes, heart disease, and stroke. The DASH eating plan may also help with weight loss. What are tips for following this plan?  General guidelines  Avoid eating more than 2,300 mg (milligrams) of salt (sodium) a day. If you have hypertension, you may need to reduce your sodium intake to 1,500 mg a day.  Limit alcohol intake to no more than 1 drink a day for nonpregnant women and 2 drinks a day for men. One drink equals 12 oz of beer, 5 oz of wine, or 1 oz of hard liquor.  Work with your health care provider to maintain a healthy body weight or to lose weight. Ask what an ideal weight is for you.  Get at least 30 minutes of exercise that causes your heart to beat faster (aerobic exercise) most days of the week. Activities may include walking, swimming, or biking.  Work with your health care provider or diet and nutrition specialist (dietitian) to adjust your eating plan to your individual calorie needs. Reading food labels   Check food labels for the amount of sodium per serving. Choose foods with less than 5 percent of the Daily Value of sodium. Generally, foods with less than 300 mg of sodium per serving fit into this eating plan.  To find whole grains, look for the word "whole" as the first word in the ingredient list. Shopping  Buy products labeled as "low-sodium" or "no salt added."  Buy fresh foods. Avoid canned foods and premade or frozen meals. Cooking  Avoid adding salt when cooking. Use salt-free seasonings or herbs instead of table salt or sea salt. Check with your health care provider or pharmacist before using salt substitutes.  Do not fry foods. Cook foods using healthy methods such as baking, boiling, grilling, and broiling instead.  Cook with  heart-healthy oils, such as olive, canola, soybean, or sunflower oil. Meal planning  Eat a balanced diet that includes: ? 5 or more servings of fruits and vegetables each day. At each meal, try to fill half of your plate with fruits and vegetables. ? Up to 6-8 servings of whole grains each day. ? Less than 6 oz of lean meat, poultry, or fish each day. A 3-oz serving of meat is about the same size as a deck of cards. One egg equals 1 oz. ? 2 servings of low-fat dairy each day. ? A serving of nuts, seeds, or beans 5 times each week. ? Heart-healthy fats. Healthy fats called Omega-3 fatty acids are found in foods such as flaxseeds and coldwater fish, like sardines, salmon, and mackerel.  Limit how much you eat of the following: ? Canned or prepackaged foods. ? Food that is high in trans fat, such as fried foods. ? Food that is high in saturated fat, such as fatty meat. ? Sweets, desserts, sugary drinks, and other foods with added sugar. ? Full-fat dairy products.  Do not salt foods before eating.  Try to eat at least 2 vegetarian meals each week.  Eat more home-cooked food and less restaurant, buffet, and fast food.  When eating at a restaurant, ask that your food be prepared with less salt or no salt, if possible. What foods are recommended? The items listed may not be a complete list. Talk with your dietitian about   what dietary choices are best for you. Grains Whole-grain or whole-wheat bread. Whole-grain or whole-wheat pasta. Brown rice. Oatmeal. Quinoa. Bulgur. Whole-grain and low-sodium cereals. Pita bread. Low-fat, low-sodium crackers. Whole-wheat flour tortillas. Vegetables Fresh or frozen vegetables (raw, steamed, roasted, or grilled). Low-sodium or reduced-sodium tomato and vegetable juice. Low-sodium or reduced-sodium tomato sauce and tomato paste. Low-sodium or reduced-sodium canned vegetables. Fruits All fresh, dried, or frozen fruit. Canned fruit in natural juice (without  added sugar). Meat and other protein foods Skinless chicken or turkey. Ground chicken or turkey. Pork with fat trimmed off. Fish and seafood. Egg whites. Dried beans, peas, or lentils. Unsalted nuts, nut butters, and seeds. Unsalted canned beans. Lean cuts of beef with fat trimmed off. Low-sodium, lean deli meat. Dairy Low-fat (1%) or fat-free (skim) milk. Fat-free, low-fat, or reduced-fat cheeses. Nonfat, low-sodium ricotta or cottage cheese. Low-fat or nonfat yogurt. Low-fat, low-sodium cheese. Fats and oils Soft margarine without trans fats. Vegetable oil. Low-fat, reduced-fat, or light mayonnaise and salad dressings (reduced-sodium). Canola, safflower, olive, soybean, and sunflower oils. Avocado. Seasoning and other foods Herbs. Spices. Seasoning mixes without salt. Unsalted popcorn and pretzels. Fat-free sweets. What foods are not recommended? The items listed may not be a complete list. Talk with your dietitian about what dietary choices are best for you. Grains Baked goods made with fat, such as croissants, muffins, or some breads. Dry pasta or rice meal packs. Vegetables Creamed or fried vegetables. Vegetables in a cheese sauce. Regular canned vegetables (not low-sodium or reduced-sodium). Regular canned tomato sauce and paste (not low-sodium or reduced-sodium). Regular tomato and vegetable juice (not low-sodium or reduced-sodium). Pickles. Olives. Fruits Canned fruit in a light or heavy syrup. Fried fruit. Fruit in cream or butter sauce. Meat and other protein foods Fatty cuts of meat. Ribs. Fried meat. Bacon. Sausage. Bologna and other processed lunch meats. Salami. Fatback. Hotdogs. Bratwurst. Salted nuts and seeds. Canned beans with added salt. Canned or smoked fish. Whole eggs or egg yolks. Chicken or turkey with skin. Dairy Whole or 2% milk, cream, and half-and-half. Whole or full-fat cream cheese. Whole-fat or sweetened yogurt. Full-fat cheese. Nondairy creamers. Whipped toppings.  Processed cheese and cheese spreads. Fats and oils Butter. Stick margarine. Lard. Shortening. Ghee. Bacon fat. Tropical oils, such as coconut, palm kernel, or palm oil. Seasoning and other foods Salted popcorn and pretzels. Onion salt, garlic salt, seasoned salt, table salt, and sea salt. Worcestershire sauce. Tartar sauce. Barbecue sauce. Teriyaki sauce. Soy sauce, including reduced-sodium. Steak sauce. Canned and packaged gravies. Fish sauce. Oyster sauce. Cocktail sauce. Horseradish that you find on the shelf. Ketchup. Mustard. Meat flavorings and tenderizers. Bouillon cubes. Hot sauce and Tabasco sauce. Premade or packaged marinades. Premade or packaged taco seasonings. Relishes. Regular salad dressings. Where to find more information:  National Heart, Lung, and Blood Institute: www.nhlbi.nih.gov  American Heart Association: www.heart.org Summary  The DASH eating plan is a healthy eating plan that has been shown to reduce high blood pressure (hypertension). It may also reduce your risk for type 2 diabetes, heart disease, and stroke.  With the DASH eating plan, you should limit salt (sodium) intake to 2,300 mg a day. If you have hypertension, you may need to reduce your sodium intake to 1,500 mg a day.  When on the DASH eating plan, aim to eat more fresh fruits and vegetables, whole grains, lean proteins, low-fat dairy, and heart-healthy fats.  Work with your health care provider or diet and nutrition specialist (dietitian) to adjust your eating plan to your   individual calorie needs. This information is not intended to replace advice given to you by your health care provider. Make sure you discuss any questions you have with your health care provider. Document Revised: 02/27/2017 Document Reviewed: 03/10/2016 Elsevier Patient Education  2020 Elsevier Inc. Carbohydrate Counting for Diabetes Mellitus, Adult  Carbohydrate counting is a method of keeping track of how many carbohydrates you  eat. Eating carbohydrates naturally increases the amount of sugar (glucose) in the blood. Counting how many carbohydrates you eat helps keep your blood glucose within normal limits, which helps you manage your diabetes (diabetes mellitus). It is important to know how many carbohydrates you can safely have in each meal. This is different for every person. A diet and nutrition specialist (registered dietitian) can help you make a meal plan and calculate how many carbohydrates you should have at each meal and snack. Carbohydrates are found in the following foods:  Grains, such as breads and cereals.  Dried beans and soy products.  Starchy vegetables, such as potatoes, peas, and corn.  Fruit and fruit juices.  Milk and yogurt.  Sweets and snack foods, such as cake, cookies, candy, chips, and soft drinks. How do I count carbohydrates? There are two ways to count carbohydrates in food. You can use either of the methods or a combination of both. Reading "Nutrition Facts" on packaged food The "Nutrition Facts" list is included on the labels of almost all packaged foods and beverages in the [place] It includes:  The serving size.  Information about nutrients in each serving, including the grams (g) of carbohydrate per serving. To use the "Nutrition Facts":  Decide how many servings you will have.  Multiply the number of servings by the number of carbohydrates per serving.  The resulting number is the total amount of carbohydrates that you will be having. Learning standard serving sizes of other foods When you eat carbohydrate foods that are not packaged or do not include "Nutrition Facts" on the label, you need to measure the servings in order to count the amount of carbohydrates:  Measure the foods that you will eat with a food scale or measuring cup, if needed.  Decide how many standard-size servings you will eat.  Multiply the number of servings by 15. Most carbohydrate-rich foods have  about 15 g of carbohydrates per serving. ? For example, if you eat 8 oz (170 g) of strawberries, you will have eaten 2 servings and 30 g of carbohydrates (2 servings x 15 g = 30 g).  For foods that have more than one food mixed, such as soups and casseroles, you must count the carbohydrates in each food that is included. The following list contains standard serving sizes of common carbohydrate-rich foods. Each of these servings has about 15 g of carbohydrates:   hamburger bun or  English muffin.   oz (15 mL) syrup.   oz (14 g) jelly.  1 slice of bread.  1 six-inch tortilla.  3 oz (85 g) cooked rice or pasta.  4 oz (113 g) cooked dried beans.  4 oz (113 g) starchy vegetable, such as peas, corn, or potatoes.  4 oz (113 g) hot cereal.  4 oz (113 g) mashed potatoes or  of a large baked potato.  4 oz (113 g) canned or frozen fruit.  4 oz (120 mL) fruit juice.  4-6 crackers.  6 chicken nuggets.  6 oz (170 g) unsweetened dry cereal.  6 oz (170 g) plain fat-free yogurt or yogurt sweetened with   artificial sweeteners.  8 oz (240 mL) milk.  8 oz (170 g) fresh fruit or one small piece of fruit.  24 oz (680 g) popped popcorn. Example of carbohydrate counting Sample meal  3 oz (85 g) chicken breast.  6 oz (170 g) brown rice.  4 oz (113 g) corn.  8 oz (240 mL) milk.  8 oz (170 g) strawberries with sugar-free whipped topping. Carbohydrate calculation 1. Identify the foods that contain carbohydrates: ? Rice. ? Corn. ? Milk. ? Strawberries. 2. Calculate how many servings you have of each food: ? 2 servings rice. ? 1 serving corn. ? 1 serving milk. ? 1 serving strawberries. 3. Multiply each number of servings by 15 g: ? 2 servings rice x 15 g = 30 g. ? 1 serving corn x 15 g = 15 g. ? 1 serving milk x 15 g = 15 g. ? 1 serving strawberries x 15 g = 15 g. 4. Add together all of the amounts to find the total grams of carbohydrates eaten: ? 30 g + 15 g + 15 g + 15  g = 75 g of carbohydrates total. Summary  Carbohydrate counting is a method of keeping track of how many carbohydrates you eat.  Eating carbohydrates naturally increases the amount of sugar (glucose) in the blood.  Counting how many carbohydrates you eat helps keep your blood glucose within normal limits, which helps you manage your diabetes.  A diet and nutrition specialist (registered dietitian) can help you make a meal plan and calculate how many carbohydrates you should have at each meal and snack. This information is not intended to replace advice given to you by your health care provider. Make sure you discuss any questions you have with your health care provider. Document Revised: 10/09/2016 Document Reviewed: 08/29/2015 Elsevier Patient Education  2020 Elsevier Inc.  

## 2019-11-10 NOTE — Progress Notes (Signed)
Established Patient Office Visit  Subjective:  Patient ID: Johnny Kelley, male    DOB: [birth year]  Age: 55 y.o. MRN: [id number]  CC:  Chief Complaint  Patient presents with  . Hypertension  . Diabetes    HPI Johnny Kelley presents for follow up of hypertension, type 2 diabetes mellitus and medication refill. He states that he's compliant with his medication and continues to make healthy lifestyle choices. His HgbA1c done on 10/27/2019 was 8.8%. He states that he checks his blood glucose twice daily and his fasting readings are usually less than 150 mg per DL.  He denies hypoglycemic symptoms and states that his peripheral neuropathy has improved with taking 100 mg of gabapentin.  He does not check his blood pressure at home, and has cut down to smoking 2 cigarettes daily.  He c/o silvery plaques and scattered nonpruritic erythematous rash to his arms and legs that has been going on for more than 10 years.  He continues to live in an uncompleted building with minimal running water, and he states that he does not want to move.  Overall, he states that he is doing well and offers no further complaint.  Past Medical History:  Diagnosis Date  . Diabetes mellitus without complication ([place])   . Hepatitis C   . Hypertension   . Stroke [facility])     Past Surgical History:  Procedure Laterality Date  . KNEE SURGERY Right     Family History  Problem Relation Age of Onset  . Colon cancer Father   . Diabetes Father     Social History   Socioeconomic History  . Marital status: Widowed    Spouse name: Not on file  . Number of children: Not on file  . Years of education: Not on file  . Highest education level: Not on file  Occupational History  . Occupation: unemployed  Tobacco Use  . Smoking status: Current Every Day Smoker    Packs/day: 1.00    Types: Cigarettes  . Smokeless tobacco: Never Used  Vaping Use  . Vaping Use: Never used  Substance and Sexual Activity  .  Alcohol use: Yes    Comment: 4-5 24 oz beer daily  . Drug use: Not Currently    Types: Cocaine, Marijuana    Comment: "occasionally"  . Sexual activity: Not on file  Other Topics Concern  . Not on file  Social History Narrative  . Not on file   Social Determinants of Health   Financial Resource Strain:   . Difficulty of Paying Living Expenses:   Food Insecurity:   . Worried About Charity fundraiser in the Last Year:   . Arboriculturist in the Last Year:   Transportation Needs:   . Film/video editor (Medical):   Marland Kitchen Lack of Transportation (Non-Medical):   Physical Activity:   . Days of Exercise per Week:   . Minutes of Exercise per Session:   Stress:   . Feeling of Stress :   Social Connections:   . Frequency of Communication with Friends and Family:   . Frequency of Social Gatherings with Friends and Family:   . Attends Religious Services:   . Active Member of Clubs or Organizations:   . Attends Archivist Meetings:   Marland Kitchen Marital Status:   Intimate Partner Violence:   . Fear of Current or Ex-Partner:   . Emotionally Abused:   Marland Kitchen Physically Abused:   . Sexually Abused:  Outpatient Medications Prior to Visit  Medication Sig Dispense Refill  . amLODipine (NORVASC) 10 MG tablet Take 1 tablet (10 mg total) by mouth daily. 30 tablet 1  . blood glucose meter kit and supplies KIT Dispense based on patient and insurance preference. Use up to four times daily as directed. (FOR ICD-9 250.00, 250.01). 1 each 0  . nicotine (NICODERM CQ - DOSED IN MG/24 HOURS) 14 mg/24hr patch Place 1 patch (14 mg total) onto the skin daily. 28 patch 0  . nitrofurantoin, macrocrystal-monohydrate, (MACROBID) 100 MG capsule Take 1 capsule (100 mg total) by mouth 2 (two) times daily. 20 capsule 0  . aspirin 325 MG tablet Take 1 tablet (325 mg total) by mouth daily. 120 tablet 0  . gabapentin (NEURONTIN) 100 MG capsule Take 1 capsule (100 mg total) by mouth at bedtime. 30 capsule 0  .  metFORMIN (GLUCOPHAGE) 1000 MG tablet Take 1 tablet (1,000 mg total) by mouth 2 (two) times daily with a meal. 60 tablet 0   No facility-administered medications prior to visit.    No Known Allergies  ROS Review of Systems  Constitutional: Negative.   Eyes: Negative for visual disturbance.  Respiratory: Negative.   Cardiovascular: Negative.   Endocrine: Negative.   Genitourinary: Negative.  Negative for dysuria.  Skin: Positive for rash (arms and legs).  Neurological: Positive for numbness (.peripheral neuropathy.).  Psychiatric/Behavioral: Negative.       Objective:    Physical Exam HENT:     Head: Normocephalic and atraumatic.  Cardiovascular:     Rate and Rhythm: Normal rate and regular rhythm.     Pulses: Normal pulses.     Heart sounds: Normal heart sounds.  Pulmonary:     Effort: Pulmonary effort is normal.     Breath sounds: Normal breath sounds.  Skin:    Findings: Rash (scattered silvery plaques, erythematous rash to arms and legs) present.  Neurological:     General: No focal deficit present.     Mental Status: He is alert and oriented to person, place, and time. Mental status is at baseline.  Psychiatric:        Mood and Affect: Mood normal.        Behavior: Behavior normal.        Thought Content: Thought content normal.        Judgment: Judgment normal.     BP (!) 150/78 (BP Location: Left Arm, Patient Position: Sitting, Cuff Size: Large)   Pulse 73   Wt 214 lb 6.4 oz (97.3 kg)   BMI 28.29 kg/m  Wt Readings from Last 3 Encounters:  11/10/19 214 lb 6.4 oz (97.3 kg)  10/27/19 (!) 210 lb (95.3 kg)  10/10/19 220 lb (99.8 kg)     Health Maintenance Due  Topic Date Due  . Hepatitis C Screening  Never done  . FOOT EXAM  Never done  . OPHTHALMOLOGY EXAM  Never done  . COVID-19 Vaccine (1) Never done  . TETANUS/TDAP  Never done  . COLONOSCOPY  Never done  . INFLUENZA VACCINE  10/30/2019    There are no preventive care reminders to display for  this patient.  No results found for: TSH Lab Results  Component Value Date   WBC 6.7 10/10/2019   HGB 15.8 10/10/2019   HCT 44.9 10/10/2019   MCV 94.3 10/10/2019   PLT 104 (L) 10/10/2019   Lab Results  Component Value Date   NA 131 (L) 10/10/2019   K 4.1 10/10/2019  CO2 23 10/10/2019   GLUCOSE 302 (H) 10/10/2019   BUN 12 10/10/2019   CREATININE 0.78 10/10/2019   BILITOT 1.9 (H) 10/10/2019   ALKPHOS 179 (H) 10/10/2019   AST 128 (H) 10/10/2019   ALT 86 (H) 10/10/2019   PROT 7.8 10/10/2019   ALBUMIN 3.1 (L) 10/10/2019   CALCIUM 8.8 (L) 10/10/2019   ANIONGAP 7 10/10/2019   Lab Results  Component Value Date   CHOL 131 11/02/2019   Lab Results  Component Value Date   HDL 56 11/02/2019   Lab Results  Component Value Date   LDLCALC 59 11/02/2019   Lab Results  Component Value Date   TRIG 85 11/02/2019   Lab Results  Component Value Date   CHOLHDL 2.3 11/02/2019   Lab Results  Component Value Date   HGBA1C 8.8 (A) 10/27/2019      Assessment & Plan:    1. Type 2 diabetes mellitus with diabetic neuropathy, without long-term current use of insulin (HCC) -His hemoglobin A1c was 8.8% and his goal should be less than 7%.  He was started on 2.5 mg glipizide, was educated on medication side effect and advised to notify clinic.  He was advised to continue checking his blood glucose twice daily, record and bring the log to follow-up visit.  He is fasting blood glucose should be between 80 and 130 mg per DL.  He was advised to continue on low carbohydrate/concentrated sweet diet. - gabapentin (NEURONTIN) 100 MG capsule; Take 1 capsule (100 mg total) by mouth at bedtime.  Dispense: 30 capsule; Refill: 0 - metFORMIN (GLUCOPHAGE) 1000 MG tablet; Take 1 tablet (1,000 mg total) by mouth 2 (two) times daily with a meal.  Dispense: 60 tablet; Refill: 0 - glipiZIDE (GLUCOTROL) 5 MG tablet; Take 0.5 tablets (2.5 mg total) by mouth daily before breakfast.  Dispense: 30 tablet; Refill:  0  2. Essential hypertension -His blood pressure is not controlled, and he will continue on current treatment regimen, was advised to continue on DASH diet and exercise as tolerated. - aspirin 325 MG tablet; Take 1 tablet (325 mg total) by mouth daily.  Dispense: 120 tablet; Refill: 0  3. Urinary tract infection without hematuria, site unspecified -He was encouraged to increase water intake. - Urinalysis; Future - UA/M w/rflx Culture, Routine; Future  4. Psoriasis -He has psoriasis, and will continue on triamcinolone cream, was educated on medication side effect and advised to notify clinic. - triamcinolone cream (KENALOG) 0.1 %; Apply 1 application topically 2 (two) times daily.  Dispense: 30 g; Refill: 1    Follow-up: Return in about 3 weeks (around 12/01/2019), or if symptoms worsen or fail to improve.    Jamarien Rodkey Jerold Coombe, NP

## 2019-11-23 ENCOUNTER — Other Ambulatory Visit: Payer: Self-pay | Admitting: Gerontology

## 2019-11-23 ENCOUNTER — Other Ambulatory Visit: Payer: Self-pay

## 2019-11-23 DIAGNOSIS — E1140 Type 2 diabetes mellitus with diabetic neuropathy, unspecified: Secondary | ICD-10-CM

## 2019-11-23 DIAGNOSIS — N390 Urinary tract infection, site not specified: Secondary | ICD-10-CM

## 2019-11-26 LAB — UA/M W/RFLX CULTURE, ROUTINE
Bilirubin, UA: NEGATIVE
Ketones, UA: NEGATIVE
Nitrite, UA: POSITIVE — AB
Protein,UA: NEGATIVE
RBC, UA: NEGATIVE
Specific Gravity, UA: 1.018 (ref 1.005–1.030)
Urobilinogen, Ur: 2 mg/dL — ABNORMAL HIGH (ref 0.2–1.0)
pH, UA: 5.5 (ref 5.0–7.5)

## 2019-11-26 LAB — MICROSCOPIC EXAMINATION: Casts: NONE SEEN /lpf

## 2019-11-26 LAB — URINE CULTURE, REFLEX

## 2019-12-01 ENCOUNTER — Encounter: Payer: Self-pay | Admitting: Gerontology

## 2019-12-01 ENCOUNTER — Other Ambulatory Visit: Payer: Self-pay

## 2019-12-01 ENCOUNTER — Ambulatory Visit: Payer: Self-pay | Admitting: Gerontology

## 2019-12-01 VITALS — BP 114/62 | HR 78 | Ht 73.0 in | Wt 211.9 lb

## 2019-12-01 DIAGNOSIS — G8929 Other chronic pain: Secondary | ICD-10-CM

## 2019-12-01 DIAGNOSIS — E1140 Type 2 diabetes mellitus with diabetic neuropathy, unspecified: Secondary | ICD-10-CM

## 2019-12-01 DIAGNOSIS — N390 Urinary tract infection, site not specified: Secondary | ICD-10-CM

## 2019-12-01 DIAGNOSIS — M545 Low back pain, unspecified: Secondary | ICD-10-CM | POA: Insufficient documentation

## 2019-12-01 DIAGNOSIS — L409 Psoriasis, unspecified: Secondary | ICD-10-CM

## 2019-12-01 DIAGNOSIS — S91209A Unspecified open wound of unspecified toe(s) with damage to nail, initial encounter: Secondary | ICD-10-CM | POA: Insufficient documentation

## 2019-12-01 DIAGNOSIS — I10 Essential (primary) hypertension: Secondary | ICD-10-CM

## 2019-12-01 MED ORDER — TRIAMCINOLONE ACETONIDE 0.1 % EX CREA: 1.0000 "application " | TOPICAL_CREAM | Freq: Two times a day (BID) | CUTANEOUS | 2 refills | Status: DC

## 2019-12-01 MED ORDER — GLIPIZIDE 5 MG PO TABS: 2.5000 mg | ORAL_TABLET | Freq: Two times a day (BID) | ORAL | 2 refills | Status: DC

## 2019-12-01 MED ORDER — GABAPENTIN 100 MG PO CAPS: 100.0000 mg | ORAL_CAPSULE | Freq: Every day | ORAL | 2 refills | Status: DC

## 2019-12-01 MED ORDER — AMLODIPINE BESYLATE 10 MG PO TABS: 10.0000 mg | ORAL_TABLET | Freq: Every day | ORAL | 2 refills | Status: DC

## 2019-12-01 MED ORDER — METFORMIN HCL 1000 MG PO TABS: 1000.0000 mg | ORAL_TABLET | Freq: Two times a day (BID) | ORAL | 2 refills | Status: DC

## 2019-12-01 NOTE — Patient Instructions (Signed)
Carbohydrate Counting for Diabetes Mellitus, Adult  Carbohydrate counting is a method of keeping track of how many carbohydrates you eat. Eating carbohydrates naturally increases the amount of sugar (glucose) in the blood. Counting how many carbohydrates you eat helps keep your blood glucose within normal limits, which helps you manage your diabetes (diabetes mellitus). It is important to know how many carbohydrates you can safely have in each meal. This is different for every person. A diet and nutrition specialist (registered dietitian) can help you make a meal plan and calculate how many carbohydrates you should have at each meal and snack. Carbohydrates are found in the following foods:  Grains, such as breads and cereals.  Dried beans and soy products.  Starchy vegetables, such as potatoes, peas, and corn.  Fruit and fruit juices.  Milk and yogurt.  Sweets and snack foods, such as cake, cookies, candy, chips, and soft drinks. How do I count carbohydrates? There are two ways to count carbohydrates in food. You can use either of the methods or a combination of both. Reading "Nutrition Facts" on packaged food The "Nutrition Facts" list is included on the labels of almost all packaged foods and beverages in the [place] It includes:  The serving size.  Information about nutrients in each serving, including the grams (g) of carbohydrate per serving. To use the "Nutrition Facts":  Decide how many servings you will have.  Multiply the number of servings by the number of carbohydrates per serving.  The resulting number is the total amount of carbohydrates that you will be having. Learning standard serving sizes of other foods When you eat carbohydrate foods that are not packaged or do not include "Nutrition Facts" on the label, you need to measure the servings in order to count the amount of carbohydrates:  Measure the foods that you will eat with a food scale or measuring cup, if  needed.  Decide how many standard-size servings you will eat.  Multiply the number of servings by 15. Most carbohydrate-rich foods have about 15 g of carbohydrates per serving. ? For example, if you eat 8 oz (170 g) of strawberries, you will have eaten 2 servings and 30 g of carbohydrates (2 servings x 15 g = 30 g).  For foods that have more than one food mixed, such as soups and casseroles, you must count the carbohydrates in each food that is included. The following list contains standard serving sizes of common carbohydrate-rich foods. Each of these servings has about 15 g of carbohydrates:   hamburger bun or  English muffin.   oz (15 mL) syrup.   oz (14 g) jelly.  1 slice of bread.  1 six-inch tortilla.  3 oz (85 g) cooked rice or pasta.  4 oz (113 g) cooked dried beans.  4 oz (113 g) starchy vegetable, such as peas, corn, or potatoes.  4 oz (113 g) hot cereal.  4 oz (113 g) mashed potatoes or  of a large baked potato.  4 oz (113 g) canned or frozen fruit.  4 oz (120 mL) fruit juice.  4-6 crackers.  6 chicken nuggets.  6 oz (170 g) unsweetened dry cereal.  6 oz (170 g) plain fat-free yogurt or yogurt sweetened with artificial sweeteners.  8 oz (240 mL) milk.  8 oz (170 g) fresh fruit or one small piece of fruit.  24 oz (680 g) popped popcorn. Example of carbohydrate counting Sample meal  3 oz (85 g) chicken breast.  6 oz (170 g)   brown rice.  4 oz (113 g) corn.  8 oz (240 mL) milk.  8 oz (170 g) strawberries with sugar-free whipped topping. Carbohydrate calculation 1. Identify the foods that contain carbohydrates: ? Rice. ? Corn. ? Milk. ? Strawberries. 2. Calculate how many servings you have of each food: ? 2 servings rice. ? 1 serving corn. ? 1 serving milk. ? 1 serving strawberries. 3. Multiply each number of servings by 15 g: ? 2 servings rice x 15 g = 30 g. ? 1 serving corn x 15 g = 15 g. ? 1 serving milk x 15 g = 15 g. ? 1  serving strawberries x 15 g = 15 g. 4. Add together all of the amounts to find the total grams of carbohydrates eaten: ? 30 g + 15 g + 15 g + 15 g = 75 g of carbohydrates total. Summary  Carbohydrate counting is a method of keeping track of how many carbohydrates you eat.  Eating carbohydrates naturally increases the amount of sugar (glucose) in the blood.  Counting how many carbohydrates you eat helps keep your blood glucose within normal limits, which helps you manage your diabetes.  A diet and nutrition specialist (registered dietitian) can help you make a meal plan and calculate how many carbohydrates you should have at each meal and snack. This information is not intended to replace advice given to you by your health care provider. Make sure you discuss any questions you have with your health care provider. Document Revised: 10/09/2016 Document Reviewed: 08/29/2015 Elsevier Patient Education  2020 Elsevier Inc.  

## 2019-12-01 NOTE — Progress Notes (Signed)
Established Patient Office Visit  Subjective:  Patient ID: Johnny Kelley, male    DOB: 05-Sep-1965  Age: 54 y.o. MRN: [id number]  CC:  Chief Complaint  Patient presents with  . Blurred Vision  . Back Pain  . Nail Problem    HPI Johnny Kelley presents for follow up of Type 2 diabetes mellitus, Psoriasis, medication refill and  lab review.  He states that his UTI has resolved. His HgbA1c done on 10/27/2019 was 8.8% and he reports that he's compliant with his medications and tolerating Glipizide 2.5 mg bid. He checks his blood glucose bid and his fasting readings are between 136 mg/dl- 155 mg/dl, his pre dinner readings are between 93-161 mg/dl. He denies hypoglycemic/ hyperglycemic symptoms, states that his neuropathy is improved with taking gabapentin. He states that his Psoriasis has improved 50% with applying Triamcinolone cream. He states that he has not been contacted by [facility] center for his appointment. Currently, he c/o experiencing constant sharp 9/10 non radiating pain to lower back. He denies any injury, states that it affects his walking and it has being going on for 1 year. He states that applying icy hot minimally relieve symptoms. He denies bowel or bladder incontinence and saddle anesthesia. He also states that his left great toe nail was 80% avulsed, though he denies pain, erythema and discharge. Overall, he states that he's doing well and offers no further complaint.  Past Medical History:  Diagnosis Date  . Diabetes mellitus without complication ([place])   . Hepatitis C   . Hypertension   . Stroke [facility])     Past Surgical History:  Procedure Laterality Date  . KNEE SURGERY Right     Family History  Problem Relation Age of Onset  . Colon cancer Father   . Diabetes Father     Social History   Socioeconomic History  . Marital status: Widowed    Spouse name: Not on file  . Number of children: Not on file  . Years of education: Not on file  . Highest  education level: Not on file  Occupational History  . Occupation: unemployed  Tobacco Use  . Smoking status: Current Every Day Smoker    Packs/day: 0.50    Types: Cigarettes  . Smokeless tobacco: Never Used  Vaping Use  . Vaping Use: Never used  Substance and Sexual Activity  . Alcohol use: Yes    Comment: 2 24 oz beers per day  . Drug use: Not Currently    Types: Cocaine, Marijuana    Comment: "occasionally"  . Sexual activity: Not on file  Other Topics Concern  . Not on file  Social History Narrative  . Not on file   Social Determinants of Health   Financial Resource Strain:   . Difficulty of Paying Living Expenses: Not on file  Food Insecurity:   . Worried About Charity fundraiser in the Last Year: Not on file  . Ran Out of Food in the Last Year: Not on file  Transportation Needs: No Transportation Needs  . Lack of Transportation (Medical): No  . Lack of Transportation (Non-Medical): No  Physical Activity:   . Days of Exercise per Week: Not on file  . Minutes of Exercise per Session: Not on file  Stress: Stress Concern Present  . Feeling of Stress : To some extent  Social Connections: Socially Isolated  . Frequency of Communication with Friends and Family: Never  . Frequency of Social Gatherings with Friends and  Family: More than three times a week  . Attends Religious Services: Never  . Active Member of Clubs or Organizations: No  . Attends Archivist Meetings: Never  . Marital Status: Divorced  Human resources officer Violence:   . Fear of Current or Ex-Partner: Not on file  . Emotionally Abused: Not on file  . Physically Abused: Not on file  . Sexually Abused: Not on file    Outpatient Medications Prior to Visit  Medication Sig Dispense Refill  . aspirin 325 MG tablet Take 1 tablet (325 mg total) by mouth daily. 120 tablet 0  . blood glucose meter kit and supplies KIT Dispense based on patient and insurance preference. Use up to four times daily as  directed. (FOR ICD-9 250.00, 250.01). 1 each 0  . amLODipine (NORVASC) 10 MG tablet Take 1 tablet (10 mg total) by mouth daily. 30 tablet 1  . gabapentin (NEURONTIN) 100 MG capsule Take 1 capsule (100 mg total) by mouth at bedtime. 30 capsule 0  . glipiZIDE (GLUCOTROL) 5 MG tablet Take 0.5 tablets (2.5 mg total) by mouth daily before breakfast. 30 tablet 0  . metFORMIN (GLUCOPHAGE) 1000 MG tablet Take 1 tablet (1,000 mg total) by mouth 2 (two) times daily with a meal. 60 tablet 0  . triamcinolone cream (KENALOG) 0.1 % Apply 1 application topically 2 (two) times daily. 30 g 1  . nicotine (NICODERM CQ - DOSED IN MG/24 HOURS) 14 mg/24hr patch Place 1 patch (14 mg total) onto the skin daily. (Patient not taking: Reported on 12/01/2019) 28 patch 0  . nitrofurantoin, macrocrystal-monohydrate, (MACROBID) 100 MG capsule Take 1 capsule (100 mg total) by mouth 2 (two) times daily. (Patient not taking: Reported on 12/01/2019) 20 capsule 0   No facility-administered medications prior to visit.    No Known Allergies  ROS Review of Systems  Constitutional: Negative.   Eyes: Positive for visual disturbance (blurry vision).  Respiratory: Negative.   Cardiovascular: Negative.   Endocrine: Negative.   Musculoskeletal: Positive for back pain.  Skin: Positive for rash (Psoriasis to arms and legs).       80% Avulsed left great toe  Neurological: Negative.   Psychiatric/Behavioral: Negative.       Objective:    Physical Exam HENT:     Head: Normocephalic and atraumatic.  Cardiovascular:     Rate and Rhythm: Normal rate and regular rhythm.     Pulses: Normal pulses.     Heart sounds: Normal heart sounds.  Pulmonary:     Effort: Pulmonary effort is normal.     Breath sounds: Normal breath sounds.  Musculoskeletal:        General: Tenderness (to lower back with forward flexion) present.     Comments: Slow gait due to back pain  Skin:    Findings: Rash (Psoriasis to arms and legs) present.      Comments: Dried dark blood under left great toe nail, no pain, erythema nor discharge  Neurological:     General: No focal deficit present.     Mental Status: He is alert and oriented to person, place, and time. Mental status is at baseline.  Psychiatric:        Mood and Affect: Mood normal.        Behavior: Behavior normal.        Thought Content: Thought content normal.        Judgment: Judgment normal.     BP 114/62 (BP Location: Right Arm, Patient Position: Sitting)  Pulse 78   Ht '6\' 1"'  (1.854 m)   Wt 211 lb 14.4 oz (96.1 kg)   SpO2 96%   BMI 27.96 kg/m  Wt Readings from Last 3 Encounters:  12/01/19 211 lb 14.4 oz (96.1 kg)  11/10/19 214 lb 6.4 oz (97.3 kg)  10/27/19 (!) 210 lb (95.3 kg)     Health Maintenance Due  Topic Date Due  . Hepatitis C Screening  Never done  . FOOT EXAM  Never done  . OPHTHALMOLOGY EXAM  Never done  . COVID-19 Vaccine (1) Never done  . TETANUS/TDAP  Never done  . COLONOSCOPY  Never done  . INFLUENZA VACCINE  Never done    There are no preventive care reminders to display for this patient.  No results found for: TSH Lab Results  Component Value Date   WBC 6.7 10/10/2019   HGB 15.8 10/10/2019   HCT 44.9 10/10/2019   MCV 94.3 10/10/2019   PLT 104 (L) 10/10/2019   Lab Results  Component Value Date   NA 131 (L) 10/10/2019   K 4.1 10/10/2019   CO2 23 10/10/2019   GLUCOSE 302 (H) 10/10/2019   BUN 12 10/10/2019   CREATININE 0.78 10/10/2019   BILITOT 1.9 (H) 10/10/2019   ALKPHOS 179 (H) 10/10/2019   AST 128 (H) 10/10/2019   ALT 86 (H) 10/10/2019   PROT 7.8 10/10/2019   ALBUMIN 3.1 (L) 10/10/2019   CALCIUM 8.8 (L) 10/10/2019   ANIONGAP 7 10/10/2019   Lab Results  Component Value Date   CHOL 131 11/02/2019   Lab Results  Component Value Date   HDL 56 11/02/2019   Lab Results  Component Value Date   LDLCALC 59 11/02/2019   Lab Results  Component Value Date   TRIG 85 11/02/2019   Lab Results  Component Value Date    CHOLHDL 2.3 11/02/2019   Lab Results  Component Value Date   HGBA1C 8.8 (A) 10/27/2019      Assessment & Plan:    1. Type 2 diabetes mellitus with diabetic neuropathy, without long-term current use of insulin (HCC) - His HgbA1c was 8.8% and his goal should be less than 7%. He will continue on current treatment regimen, low carb/ non concentrated sweet diet. He was advised that his fasting blood glucose reading goal should be between 80-130 mg/dl. - HgB A1c; Future - gabapentin (NEURONTIN) 100 MG capsule; Take 1 capsule (100 mg total) by mouth at bedtime.  Dispense: 30 capsule; Refill: 2 - glipiZIDE (GLUCOTROL) 5 MG tablet; Take 0.5 tablets (2.5 mg total) by mouth 2 (two) times daily before a meal.  Dispense: 30 tablet; Refill: 2 - metFORMIN (GLUCOPHAGE) 1000 MG tablet; Take 1 tablet (1,000 mg total) by mouth 2 (two) times daily with a meal.  Dispense: 60 tablet; Refill: 2  2. Urinary tract infection without hematuria, site unspecified - His UTI has resolved, need to recheck urine. - Urinalysis; Future - UA/M w/rflx Culture, Routine; Future  3. Psoriasis - His Psoriasis is improving, he will continue to apply Triamcinolone cream - triamcinolone cream (KENALOG) 0.1 %; Apply 1 application topically 2 (two) times daily.  Dispense: 30 g; Refill: 2  4. Essential hypertension - His blood pressure is under control and he will continue on current treatment regimen, and DASH diet. - amLODipine (NORVASC) 10 MG tablet; Take 1 tablet (10 mg total) by mouth daily.  Dispense: 30 tablet; Refill: 2  5. Chronic bilateral low back pain, unspecified whether sciatica present - He will  follow up with [facility] Orthopedic Surgeon Dr Vickki Hearing  6. Avulsion of toenail, initial encounter - He was advised to complete [employer] application for - Ambulatory referral to Podiatry    Follow-up: Return in about 2 months (around 02/01/2020), or if symptoms worsen or fail to improve.    Shaely Gadberry Jerold Coombe, NP

## 2019-12-07 ENCOUNTER — Ambulatory Visit: Payer: Self-pay | Admitting: Pharmacist

## 2019-12-07 ENCOUNTER — Other Ambulatory Visit: Payer: Self-pay

## 2019-12-07 VITALS — BP 118/80

## 2019-12-07 DIAGNOSIS — Z79899 Other long term (current) drug therapy: Secondary | ICD-10-CM
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
## 2019-12-13 ENCOUNTER — Other Ambulatory Visit: Payer: Self-pay

## 2019-12-13 ENCOUNTER — Ambulatory Visit: Payer: Self-pay | Admitting: Specialist

## 2019-12-13 DIAGNOSIS — M545 Low back pain, unspecified: Secondary | ICD-10-CM

## 2019-12-13 NOTE — Progress Notes (Signed)
Established Patient Office Visit  Subjective:  Patient ID: Johnny Kelley, male    DOB: 25-May-1965  Age: 54 y.o. MRN: [id number]  CC: No chief complaint on file.   HPI Johnny Kelley presents for   54 year old male.  Past Medical History:  Diagnosis Date  . Diabetes mellitus without complication ([place])   . Hepatitis C   . Hypertension   . Psoriasis   . Stroke [facility])    Hx stroke in 2020    Past Surgical History:  Procedure Laterality Date  . KNEE SURGERY Right     Family History  Problem Relation Age of Onset  . Colon cancer Father   . Diabetes Father   . Diabetes Mother     Social History   Socioeconomic History  . Marital status: Widowed    Spouse name: Not on file  . Number of children: Not on file  . Years of education: Not on file  . Highest education level: Not on file  Occupational History  . Occupation: unemployed  Tobacco Use  . Smoking status: Current Every Day Smoker    Packs/day: 1.00    Years: 38.00    Pack years: 38.00    Types: Cigarettes  . Smokeless tobacco: Never Used  . Tobacco comment: 3-4 cigarettes  per day  Vaping Use  . Vaping Use: Never used  Substance and Sexual Activity  . Alcohol use: Yes    Comment: 2 24 oz beers per day  . Drug use: Not Currently    Types: Cocaine, Marijuana    Comment: reports marijuana use once per month and cocaine use 2-3 times per month  . Sexual activity: Not on file  Other Topics Concern  . Not on file  Social History Narrative  . Not on file   Social Determinants of Health   Financial Resource Strain:   . Difficulty of Paying Living Expenses: Not on file  Food Insecurity:   . Worried About Charity fundraiser in the Last Year: Not on file  . Ran Out of Food in the Last Year: Not on file  Transportation Needs: No Transportation Needs  . Lack of Transportation (Medical): No  . Lack of Transportation (Non-Medical): No  Physical Activity:   . Days of Exercise per Week: Not on file   . Minutes of Exercise per Session: Not on file  Stress: Stress Concern Present  . Feeling of Stress : To some extent  Social Connections: Socially Isolated  . Frequency of Communication with Friends and Family: Never  . Frequency of Social Gatherings with Friends and Family: More than three times a week  . Attends Religious Services: Never  . Active Member of Clubs or Organizations: No  . Attends Archivist Meetings: Never  . Marital Status: Divorced  Human resources officer Violence:   . Fear of Current or Ex-Partner: Not on file  . Emotionally Abused: Not on file  . Physically Abused: Not on file  . Sexually Abused: Not on file    Outpatient Medications Prior to Visit  Medication Sig Dispense Refill  . amLODipine (NORVASC) 10 MG tablet Take 1 tablet (10 mg total) by mouth daily. 30 tablet 2  . aspirin 325 MG tablet Take 1 tablet (325 mg total) by mouth daily. 120 tablet 0  . blood glucose meter kit and supplies KIT Dispense based on patient and insurance preference. Use up to four times daily as directed. (FOR ICD-9 250.00, 250.01). 1 each 0  .  gabapentin (NEURONTIN) 100 MG capsule Take 1 capsule (100 mg total) by mouth at bedtime. 30 capsule 2  . glipiZIDE (GLUCOTROL) 5 MG tablet Take 0.5 tablets (2.5 mg total) by mouth 2 (two) times daily before a meal. 30 tablet 2  . metFORMIN (GLUCOPHAGE) 1000 MG tablet Take 1 tablet (1,000 mg total) by mouth 2 (two) times daily with a meal. 60 tablet 2  . nicotine (NICODERM CQ - DOSED IN MG/24 HOURS) 14 mg/24hr patch Place 1 patch (14 mg total) onto the skin daily. (Patient not taking: Reported on 12/01/2019) 28 patch 0  . triamcinolone cream (KENALOG) 0.1 % Apply 1 application topically 2 (two) times daily. 30 g 2   No facility-administered medications prior to visit.    No Known Allergies  ROS Review of Systems    Objective:    Physical Exam Has a slow wide based gait. Holding on the counter for balance, he is able to stand on his  toes and his heels. He declines to do a squat. Able to march in place with normal muscle recruitment. Has a negative Trendelenburg.  ROM: he has 20 degrees R/L lateral flexion. He has FF although he bends his knees, he has 20 degrees of extension. DTRs 2+ at the knees. 1+ at the ankles,. Toe signs are downward. There is no clonus present. There is no sensory loss.  There were no vitals taken for this visit. Wt Readings from Last 3 Encounters:  12/01/19 211 lb 14.4 oz (96.1 kg)  11/10/19 214 lb 6.4 oz (97.3 kg)  10/27/19 (!) 210 lb (95.3 kg)     Health Maintenance Due  Topic Date Due  . Hepatitis C Screening  Never done  . OPHTHALMOLOGY EXAM  Never done  . COVID-19 Vaccine (1) Never done  . TETANUS/TDAP  Never done  . COLONOSCOPY  Never done  . INFLUENZA VACCINE  Never done    There are no preventive care reminders to display for this patient.  No results found for: TSH Lab Results  Component Value Date   WBC 6.7 10/10/2019   HGB 15.8 10/10/2019   HCT 44.9 10/10/2019   MCV 94.3 10/10/2019   PLT 104 (L) 10/10/2019   Lab Results  Component Value Date   NA 131 (L) 10/10/2019   K 4.1 10/10/2019   CO2 23 10/10/2019   GLUCOSE 302 (H) 10/10/2019   BUN 12 10/10/2019   CREATININE 0.78 10/10/2019   BILITOT 1.9 (H) 10/10/2019   ALKPHOS 179 (H) 10/10/2019   AST 128 (H) 10/10/2019   ALT 86 (H) 10/10/2019   PROT 7.8 10/10/2019   ALBUMIN 3.1 (L) 10/10/2019   CALCIUM 8.8 (L) 10/10/2019   ANIONGAP 7 10/10/2019   Lab Results  Component Value Date   CHOL 131 11/02/2019   Lab Results  Component Value Date   HDL 56 11/02/2019   Lab Results  Component Value Date   LDLCALC 59 11/02/2019   Lab Results  Component Value Date   TRIG 85 11/02/2019   Lab Results  Component Value Date   CHOLHDL 2.3 11/02/2019   Lab Results  Component Value Date   HGBA1C 8.8 (A) 10/27/2019      Assessment & Plan:  Assessment: Axial back pain, probably secondary to gait  alternation.  Plan: Send to physical therapy for HEP. Return PRN. Problem List Items Addressed This Visit    None      No orders of the defined types were placed in this encounter.   Follow-up:  No follow-ups on file.    Marisa Sprinkles

## 2019-12-14 ENCOUNTER — Other Ambulatory Visit
Admission: RE | Admit: 2019-12-14 | Discharge: 2019-12-14 | Disposition: A | Discharge status: Home / Self Care | Payer: Self-pay | Attending: Gastroenterology | Admitting: Gastroenterology

## 2019-12-14 ENCOUNTER — Ambulatory Visit (INDEPENDENT_AMBULATORY_CARE_PROVIDER_SITE_OTHER): Payer: Self-pay | Admitting: Gastroenterology

## 2019-12-14 ENCOUNTER — Encounter: Payer: Self-pay | Admitting: Gastroenterology

## 2019-12-14 VITALS — BP 142/70 | HR 82 | Ht 73.0 in | Wt 223.0 lb

## 2019-12-14 DIAGNOSIS — K769 Liver disease, unspecified: Secondary | ICD-10-CM | POA: Insufficient documentation

## 2019-12-14 DIAGNOSIS — R748 Abnormal levels of other serum enzymes: Secondary | ICD-10-CM

## 2019-12-14 DIAGNOSIS — R160 Hepatomegaly, not elsewhere classified: Secondary | ICD-10-CM

## 2019-12-15 LAB — AFP TUMOR MARKER: AFP, Serum, Tumor Marker: 5.6 ng/mL (ref 0.0–8.3)

## 2019-12-15 LAB — CEA: CEA: 8.6 ng/mL — ABNORMAL HIGH (ref 0.0–4.7)

## 2019-12-15 NOTE — Progress Notes (Signed)
Gastroenterology Consultation  Referring Provider:     Delman Kitten, MD Primary Care Physician:  Langston Reusing, NP Primary Gastroenterologist:  Dr. Allen Norris     Reason for Consultation:     Hepatic mass        HPI:   Johnny Kelley is a 54 y.o. y/o male referred for consultation & management of Hepatic mass by Dr. Hattie Perch, Lonny Prude, NP.  This patient comes to see me after being in the emergency room back in July for numbness.  At that time the patient was reporting that he had continuous heavy alcohol abuse. The patient had an ultrasound at that time that showed:  IMPRESSION: 4.1 cm heterogeneous rounded abnormality seen in the right hepatic lobe concerning for possible mass or neoplasm. Further evaluation with MRI is recommended. Increased echogenicity of hepatic parenchyma is noted suggesting hepatic steatosis or other diffuse hepatocellular disease.  The patient also had an MRI of the brain since he has a history of strokes.  Due to the patient's mass found on his liver a recommendation for an MRI was made.  The patient has not had the MRI yet and is now here to see me. The patient's chart has a history of hepatitis C and the patient reports that he is never had a colonoscopy in the past.  The patient does report a father with colon cancer in his family history.  Past Medical History:  Diagnosis Date  . Diabetes mellitus without complication ([place])   . Hepatitis C   . Hypertension   . Psoriasis   . Stroke [facility])    Hx stroke in 2020    Past Surgical History:  Procedure Laterality Date  . KNEE SURGERY Right     Prior to Admission medications   Medication Sig Start Date End Date Taking? Authorizing Provider  amLODipine (NORVASC) 10 MG tablet Take 1 tablet (10 mg total) by mouth daily. 12/01/19 01/30/20 Yes Iloabachie, Chioma E, NP  aspirin 325 MG tablet Take 1 tablet (325 mg total) by mouth daily. 11/10/19  Yes Iloabachie, Chioma E, NP  blood glucose meter kit and  supplies KIT Dispense based on patient and insurance preference. Use up to four times daily as directed. (FOR ICD-9 250.00, 250.01). 10/27/19  Yes Iloabachie, Chioma E, NP  gabapentin (NEURONTIN) 100 MG capsule Take 1 capsule (100 mg total) by mouth at bedtime. 12/01/19  Yes Iloabachie, Chioma E, NP  glipiZIDE (GLUCOTROL) 5 MG tablet Take 0.5 tablets (2.5 mg total) by mouth 2 (two) times daily before a meal. 12/01/19  Yes Iloabachie, Chioma E, NP  metFORMIN (GLUCOPHAGE) 1000 MG tablet Take 1 tablet (1,000 mg total) by mouth 2 (two) times daily with a meal. 12/01/19  Yes Iloabachie, Chioma E, NP  triamcinolone cream (KENALOG) 0.1 % Apply 1 application topically 2 (two) times daily. 12/01/19  Yes Iloabachie, Chioma E, NP  nicotine (NICODERM CQ - DOSED IN MG/24 HOURS) 14 mg/24hr patch Place 1 patch (14 mg total) onto the skin daily. Patient not taking: Reported on 12/01/2019 09/26/18   Bettey Costa, MD    Family History  Problem Relation Age of Onset  . Colon cancer Father   . Diabetes Father   . Diabetes Mother      Social History   Tobacco Use  . Smoking status: Current Every Day Smoker    Packs/day: 1.00    Years: 38.00    Pack years: 38.00    Types: Cigarettes  . Smokeless tobacco: Never Used  .  Tobacco comment: 3-4 cigarettes  per day  Vaping Use  . Vaping Use: Never used  Substance Use Topics  . Alcohol use: Yes    Comment: 2 24 oz beers per day  . Drug use: Not Currently    Types: Cocaine, Marijuana    Comment: reports marijuana use once per month and cocaine use 2-3 times per month    Allergies as of 12/14/2019  . (No Known Allergies)    Review of Systems:    All systems reviewed and negative except where noted in HPI.   Physical Exam:  BP (!) 142/70   Pulse 82   Ht _0  (1.854 m)   Wt 223 lb (101.2 kg)   BMI 29.42 kg/m  No LMP for male patient. General:   Alert,  Well-developed, well-nourished, pleasant and cooperative in NAD Head:  Normocephalic and atraumatic. Eyes:   Sclera clear, no icterus.   Conjunctiva pink. Ears:  Normal auditory acuity. Neck:  Supple; no masses or thyromegaly. Lungs:  Respirations even and unlabored.  Clear throughout to auscultation.   No wheezes, crackles, or rhonchi. No acute distress. Heart:  Regular rate and rhythm; no murmurs, clicks, rubs, or gallops. Abdomen:  Normal bowel sounds.  No bruits.  Soft, non-tender and non-distended without masses, hepatosplenomegaly or hernias noted.  No guarding or rebound tenderness.  Negative Carnett sign.   Rectal:  Deferred.  Pulses:  Normal pulses noted. Extremities:  No clubbing or edema.  No cyanosis. Neurologic:  Alert and oriented x3;  grossly normal neurologically. Skin:  Intact without significant lesions or rashes.  No jaundice. Lymph Nodes:  No significant cervical adenopathy. Psych:  Alert and cooperative. Normal mood and affect.  Imaging Studies: No results found.  Assessment and Plan:   Johnny Kelley is a 54 y.o. y/o male Who comes in with multiple medical problems including alcohol abuse chronic back pain and significantly elevated liver enzymes back in July.  The pattern at that time was consistent with alcoholic hepatitis. The patient likely has cirrhosis from his alcohol abuse and has a family history of colon cancer with the patient never had a colonoscopy.  The patient will be set up for a MRI of the liver and will also have CEA and alpha-fetoprotein checked.  I discussed with the patient a stepwise approach to this testing since the patient is uninsured at the present time.  The patient will be contacted with the results of his labs and further testing will depend on those results.  The patient has been explained the plan and agrees with it.    [name], MD. Marval Regal    Note: This dictation was prepared with Dragon dictation along with smaller phrase technology. Any transcriptional errors that result from this process are unintentional.

## 2019-12-15 NOTE — Progress Notes (Signed)
The patient know that his liver tumor marker was normal but his colon tumor marker was slightly elevated and he needs to be set up for colonoscopy.

## 2019-12-26 ENCOUNTER — Other Ambulatory Visit: Payer: Self-pay

## 2019-12-30 ENCOUNTER — Ambulatory Visit
Admission: RE | Admit: 2019-12-30 | Discharge: 2019-12-30 | Disposition: A | Discharge status: Home / Self Care | Payer: Self-pay | Source: Ambulatory Visit | Attending: Gastroenterology | Admitting: Gastroenterology

## 2019-12-30 ENCOUNTER — Other Ambulatory Visit: Payer: Self-pay

## 2019-12-30 DIAGNOSIS — K769 Liver disease, unspecified: Secondary | ICD-10-CM | POA: Insufficient documentation

## 2019-12-30 MED ORDER — GADOBUTROL 1 MMOL/ML IV SOLN
10.0000 mL | Freq: Once | INTRAVENOUS | Status: AC | PRN
  Administered 2019-12-30: 10 mL via INTRAVENOUS

## 2020-01-03 ENCOUNTER — Telehealth: Payer: Self-pay

## 2020-01-03 NOTE — Telephone Encounter (Signed)
-----   Message from [name], MD sent at 12/15/2019  9:39 AM EDT ----- The patient know that his liver tumor marker was normal but his colon tumor marker was slightly elevated and he needs to be set up for colonoscopy.

## 2020-01-03 NOTE — Telephone Encounter (Signed)
Pt's brother notified of results. Pt's brother advised me Mr. Deren does not have health insurance and he can't afford the bills. [facility] [place] [facility] He stated they had filled out the [facility] patient assistance paperwork a few months ago but hadn't heard back. I have advised him to contact [place] to inquire about the status. He will discuss the colonoscopy with him and call back if he wishes to schedule.

## 2020-01-04 ENCOUNTER — Telehealth: Payer: Self-pay

## 2020-01-04 NOTE — Telephone Encounter (Signed)
Pt scheduled for a follow up appt with Dr. Allen Norris on Monday, Oct 11th.

## 2020-01-04 NOTE — Telephone Encounter (Signed)
-----   Message from [name], MD sent at 01/04/2020  1:28 PM EDT ----- This patient needs to be added to my office early next week to go over his MRI.

## 2020-01-09 ENCOUNTER — Other Ambulatory Visit
Admission: RE | Admit: 2020-01-09 | Discharge: 2020-01-09 | Disposition: A | Discharge status: Home / Self Care | Payer: Self-pay | Attending: Gastroenterology | Admitting: Gastroenterology

## 2020-01-09 ENCOUNTER — Other Ambulatory Visit: Payer: Self-pay

## 2020-01-09 ENCOUNTER — Encounter: Payer: Self-pay | Admitting: Gastroenterology

## 2020-01-09 ENCOUNTER — Ambulatory Visit (INDEPENDENT_AMBULATORY_CARE_PROVIDER_SITE_OTHER): Payer: Self-pay | Admitting: Gastroenterology

## 2020-01-09 VITALS — BP 133/71 | HR 80 | Ht 73.0 in | Wt 220.0 lb

## 2020-01-09 DIAGNOSIS — R160 Hepatomegaly, not elsewhere classified: Secondary | ICD-10-CM

## 2020-01-09 DIAGNOSIS — C220 Liver cell carcinoma: Secondary | ICD-10-CM

## 2020-01-09 DIAGNOSIS — B182 Chronic viral hepatitis C: Secondary | ICD-10-CM

## 2020-01-09 NOTE — Progress Notes (Signed)
Primary Care Physician: Langston Reusing, NP  Primary Gastroenterologist:  Dr. [name]  No chief complaint on file.   HPI: Johnny Kelley is a 54 y.o. male here this patient was found to have abnormality on imaging and was sent to me for evaluation.  The patient had an MRI of the liver that was consistent with a hepatocellular carcinoma.  The patient has a history of cirrhosis.  He is now here to follow-up on the results of the MRI. The patient denies ever being treated for hepatitis C.  He states he did not get treated because he says that it did not cause him any problems.   Past Medical History:  Diagnosis Date  . Diabetes mellitus without complication ([place])   . Hepatitis C   . Hypertension   . Psoriasis   . Stroke [facility])    Hx stroke in 2020    Current Outpatient Medications  Medication Sig Dispense Refill  . amLODipine (NORVASC) 10 MG tablet Take 1 tablet (10 mg total) by mouth daily. 30 tablet 2  . aspirin 325 MG tablet Take 1 tablet (325 mg total) by mouth daily. 120 tablet 0  . blood glucose meter kit and supplies KIT Dispense based on patient and insurance preference. Use up to four times daily as directed. (FOR ICD-9 250.00, 250.01). 1 each 0  . gabapentin (NEURONTIN) 100 MG capsule Take 1 capsule (100 mg total) by mouth at bedtime. 30 capsule 2  . glipiZIDE (GLUCOTROL) 5 MG tablet Take 0.5 tablets (2.5 mg total) by mouth 2 (two) times daily before a meal. 30 tablet 2  . metFORMIN (GLUCOPHAGE) 1000 MG tablet Take 1 tablet (1,000 mg total) by mouth 2 (two) times daily with a meal. 60 tablet 2  . nicotine (NICODERM CQ - DOSED IN MG/24 HOURS) 14 mg/24hr patch Place 1 patch (14 mg total) onto the skin daily. (Patient not taking: Reported on 12/01/2019) 28 patch 0  . triamcinolone cream (KENALOG) 0.1 % Apply 1 application topically 2 (two) times daily. 30 g 2   No current facility-administered medications for this visit.    Allergies as of 01/09/2020  . (No  Known Allergies)    ROS:  General: Negative for anorexia, weight loss, fever, chills, fatigue, weakness. ENT: Negative for hoarseness, difficulty swallowing , nasal congestion. CV: Negative for chest pain, angina, palpitations, dyspnea on exertion, peripheral edema.  Respiratory: Negative for dyspnea at rest, dyspnea on exertion, cough, sputum, wheezing.  GI: See history of present illness. GU:  Negative for dysuria, hematuria, urinary incontinence, urinary frequency, nocturnal urination.  Endo: Negative for unusual weight change.    Physical Examination:   There were no vitals taken for this visit.  General: Well-nourished, well-developed in no acute distress.  Eyes: No icterus. Conjunctivae pink. Psych: Alert and cooperative, normal mood and affect.  Labs:    Imaging Studies: MR LIVER W WO CONTRAST  Result Date: 12/30/2019 CLINICAL DATA:  Liver lesion on ultrasound EXAM: MRI ABDOMEN WITHOUT AND WITH CONTRAST TECHNIQUE: Multiplanar multisequence MR imaging of the abdomen was performed both before and after the administration of intravenous contrast. CONTRAST:  79m GADAVIST GADOBUTROL 1 MMOL/ML IV SOLN COMPARISON:  Right upper quadrant ultrasound dated 10/10/2019 FINDINGS: Lower chest: Lung bases are clear. Hepatobiliary: Nodular hepatic contour, suggesting cirrhosis, with scattered areas of fibrosis on T2. 3.8 x 3.2 cm enhancing lesion in segment 6 (series 14/image 40), stealthy on T2 (series 10/image 13), with intrinsic T1 hyperintensity (series 12/image 41), arterial  enhancement on postcontrast subtraction imaging (series 15/image 41), portal venous washout with a pseudocapsule (series 16/image 27). By definition, this reflects a LI-RADS 5 lesion, and is considered compatible with [place]. Gallbladder is unremarkable. No intrahepatic or extrahepatic ductal dilatation. Pancreas:  Within normal limits. Spleen:  Within normal limits. Adrenals/Urinary Tract:  Adrenal glands are within normal  limits. Kidneys are within normal limits.  No hydronephrosis. Stomach/Bowel: Stomach is within normal limits. Visualized bowel is unremarkable. Vascular/Lymphatic:  No evidence abdominal aortic aneurysm. Perigastric varices.  Portal vein is patent. Small upper abdominal lymph nodes, likely reactive. Other:  No abdominal ascites. Musculoskeletal: No focal osseous lesions. IMPRESSION: Cirrhosis. 3.8 cm enhancing lesion in segment 6, compatible with HCC. LI-RADS 5. Mild perigastric varices. Spleen is normal in size. Portal vein is patent. No abdominal ascites. Electronically Signed   By: Julian Hy M.D.   On: 12/30/2019 13:04    Assessment and Plan:   Johnny Kelley is a 54 y.o. y/o male who comes in with a liver lesion that was found to be very suspicious for hepatocellular carcinoma.  The patient will be sent to oncology for ruling out any metastatic disease and will also be set up with interventional radiology for possible local treatment of the lesion with possible embolization.  The patient will also be tested for genotype and viral load and he will be treated for his hepatitis C based on the results.  The patient has been explained the plan and agrees with it.     [name], MD. Marval Regal    Note: This dictation was prepared with Dragon dictation along with smaller phrase technology. Any transcriptional errors that result from this process are unintentional.

## 2020-01-10 LAB — HCV RNA QUANT
HCV Quantitative Log: 5.521 log10 IU/mL (ref >1.70)
HCV Quantitative: 332000 IU/mL (ref >50)

## 2020-01-11 LAB — HEPATITIS C GENOTYPE: HCV Genotype: 3

## 2020-01-12 ENCOUNTER — Other Ambulatory Visit: Payer: Self-pay

## 2020-01-12 ENCOUNTER — Other Ambulatory Visit: Payer: Self-pay | Admitting: Gastroenterology

## 2020-01-12 DIAGNOSIS — R160 Hepatomegaly, not elsewhere classified: Secondary | ICD-10-CM

## 2020-01-12 DIAGNOSIS — K769 Liver disease, unspecified: Secondary | ICD-10-CM

## 2020-01-13 ENCOUNTER — Encounter (INDEPENDENT_AMBULATORY_CARE_PROVIDER_SITE_OTHER): Payer: Self-pay

## 2020-01-13 ENCOUNTER — Inpatient Hospital Stay: Payer: Medicaid Other | Attending: Oncology | Admitting: Oncology

## 2020-01-13 ENCOUNTER — Other Ambulatory Visit: Payer: Self-pay

## 2020-01-13 ENCOUNTER — Inpatient Hospital Stay: Payer: Medicaid Other

## 2020-01-13 ENCOUNTER — Encounter: Payer: Self-pay | Admitting: Oncology

## 2020-01-13 VITALS — BP 145/75 | HR 88 | Temp 97.7°F | Resp 16 | Wt 221.1 lb

## 2020-01-13 DIAGNOSIS — C220 Liver cell carcinoma: Secondary | ICD-10-CM

## 2020-01-13 DIAGNOSIS — B1920 Unspecified viral hepatitis C without hepatic coma: Secondary | ICD-10-CM | POA: Diagnosis not present

## 2020-01-13 DIAGNOSIS — F17210 Nicotine dependence, cigarettes, uncomplicated: Secondary | ICD-10-CM | POA: Insufficient documentation

## 2020-01-13 LAB — CBC WITH DIFFERENTIAL/PLATELET
Abs Immature Granulocytes: 0.01 10*3/uL (ref 0.00–0.07)
Basophils Absolute: 0.1 10*3/uL (ref 0.0–0.1)
Basophils Relative: 1 %
Eosinophils Absolute: 0.2 10*3/uL (ref 0.0–0.5)
Eosinophils Relative: 3 %
HCT: 42.5 % (ref 39.0–52.0)
Hemoglobin: 14.7 g/dL (ref 13.0–17.0)
Immature Granulocytes: 0 %
Lymphocytes Relative: 26 %
Lymphs Abs: 1.5 10*3/uL (ref 0.7–4.0)
MCH: 32.9 pg (ref 26.0–34.0)
MCHC: 34.6 g/dL (ref 30.0–36.0)
MCV: 95.1 fL (ref 80.0–100.0)
Monocytes Absolute: 1 10*3/uL (ref 0.1–1.0)
Monocytes Relative: 17 %
Neutro Abs: 3.1 10*3/uL (ref 1.7–7.7)
Neutrophils Relative %: 53 %
Platelets: 105 10*3/uL — ABNORMAL LOW (ref 150–400)
RBC: 4.47 MIL/uL (ref 4.22–5.81)
RDW: 13.7 % (ref 11.5–15.5)
WBC: 5.8 10*3/uL (ref 4.0–10.5)
nRBC: 0 % (ref 0.0–0.2)

## 2020-01-13 LAB — COMPREHENSIVE METABOLIC PANEL
ALT: 51 U/L — ABNORMAL HIGH (ref 0–44)
AST: 70 U/L — ABNORMAL HIGH (ref 15–41)
Albumin: 3.5 g/dL (ref 3.5–5.0)
Alkaline Phosphatase: 110 U/L (ref 38–126)
Anion gap: 8 (ref 5–15)
BUN: 15 mg/dL (ref 6–20)
CO2: 25 mmol/L (ref 22–32)
Calcium: 9.3 mg/dL (ref 8.9–10.3)
Chloride: 104 mmol/L (ref 98–111)
Creatinine, Ser: 0.84 mg/dL (ref 0.61–1.24)
GFR, Estimated: >60 mL/min (ref >60)
Glucose, Bld: 117 mg/dL — ABNORMAL HIGH (ref 70–99)
Potassium: 4.7 mmol/L (ref 3.5–5.1)
Sodium: 137 mmol/L (ref 135–145)
Total Bilirubin: 1.6 mg/dL — ABNORMAL HIGH (ref 0.3–1.2)
Total Protein: 7.5 g/dL (ref 6.5–8.1)

## 2020-01-13 LAB — PROTIME-INR
INR: 1.1 (ref 0.8–1.2)
Prothrombin Time: 14 seconds (ref 11.4–15.2)

## 2020-01-13 NOTE — Progress Notes (Signed)
Met with Johnny Kelley along with Dr. Janese Banks during his consult. Met with his brother, Johnny Kelley, following visit. He has a phone capable of phone visit only with no video capability. I reviewed all upcoming appointments for chest CT and IR virtual visit. Johnny Kelley was aware of the IR visit details. He was given virtual follow up with Dr. Janese Banks. Referral sent to [facility] and voicemail left with new patient coordinator. Educated Johnny Kelley that we will call him with this appointment once arranged. Johnny Kelley has asked that we call his brother with everything as he does not have a phone.

## 2020-01-16 ENCOUNTER — Telehealth: Payer: Self-pay

## 2020-01-16 NOTE — Telephone Encounter (Signed)
Scheduled to see Dr. Barry Dienes 10/25 at 1500 at [place].

## 2020-01-17 ENCOUNTER — Encounter: Payer: Self-pay | Admitting: Podiatry

## 2020-01-17 ENCOUNTER — Ambulatory Visit (INDEPENDENT_AMBULATORY_CARE_PROVIDER_SITE_OTHER): Payer: Medicaid Other | Admitting: Podiatry

## 2020-01-17 ENCOUNTER — Other Ambulatory Visit: Payer: Self-pay

## 2020-01-17 DIAGNOSIS — E1169 Type 2 diabetes mellitus with other specified complication: Secondary | ICD-10-CM

## 2020-01-17 DIAGNOSIS — E1165 Type 2 diabetes mellitus with hyperglycemia: Secondary | ICD-10-CM

## 2020-01-17 DIAGNOSIS — M79675 Pain in left toe(s): Secondary | ICD-10-CM

## 2020-01-17 DIAGNOSIS — M79674 Pain in right toe(s): Secondary | ICD-10-CM | POA: Diagnosis not present

## 2020-01-17 DIAGNOSIS — B351 Tinea unguium: Secondary | ICD-10-CM

## 2020-01-17 DIAGNOSIS — IMO0002 Reserved for concepts with insufficient information to code with codable children: Secondary | ICD-10-CM

## 2020-01-17 NOTE — Progress Notes (Signed)
  Subjective:  Patient ID: Johnny Kelley, male    DOB: [birth year],  MRN: [id number]  Chief Complaint  Patient presents with  . Nail Problem    Patient presents today for nail fungus all nails x years   54 y.o. male returns for the above complaint.  Patient presents with thickened elongated dystrophic toenails x10.  Patient states that they are painful to cut.  He is not able to cut himself.  He would like for me to do it.  He denies any other acute complaints he is a diabetic with last A1c of 10%.  Objective:  There were no vitals filed for this visit. Podiatric Exam: Vascular: dorsalis pedis and posterior tibial pulses are palpable bilateral. Capillary return is immediate. Temperature gradient is WNL. Skin turgor WNL  Sensorium: Normal Semmes Weinstein monofilament test. Normal tactile sensation bilaterally. Nail Exam: Pt has thick disfigured discolored nails with subungual debris noted bilateral entire nail hallux through fifth toenails.  Pain on palpation to the nails. Ulcer Exam: There is no evidence of ulcer or pre-ulcerative changes or infection. Orthopedic Exam: Muscle tone and strength are WNL. No limitations in general ROM. No crepitus or effusions noted. HAV  B/L.  Hammer toes 2-5  B/L. Skin: No Porokeratosis. No infection or ulcers    Assessment & Plan:   1. Pain due to onychomycosis of toenails of both feet   2. DM w/ coma type II, uncontrolled ([place])     Patient was evaluated and treated and all questions answered.  Onychomycosis with pain  -Nails palliatively debrided as below. -Educated on self-care  Procedure: Nail Debridement Rationale: pain  Type of Debridement: manual, sharp debridement. Instrumentation: Nail nipper, rotary burr. Number of Nails: 10  Procedures and Treatment: Consent by patient was obtained for treatment procedures. The patient understood the discussion of treatment and procedures well. All questions were answered thoroughly reviewed.  Debridement of mycotic and hypertrophic toenails, 1 through 5 bilateral and clearing of subungual debris. No ulceration, no infection noted.  Return Visit-Office Procedure: Patient instructed to return to the office for a follow up visit 3 months for continued evaluation and treatment.  Boneta Lucks, DPM    No follow-ups on file.

## 2020-01-18 ENCOUNTER — Encounter: Payer: Self-pay | Admitting: Oncology

## 2020-01-18 ENCOUNTER — Ambulatory Visit
Admission: RE | Admit: 2020-01-18 | Discharge: 2020-01-18 | Disposition: A | Discharge status: Home / Self Care | Payer: Self-pay | Source: Ambulatory Visit | Attending: Oncology | Admitting: Oncology

## 2020-01-18 DIAGNOSIS — C220 Liver cell carcinoma: Secondary | ICD-10-CM | POA: Insufficient documentation

## 2020-01-18 MED ORDER — IOHEXOL 300 MG/ML  SOLN
75.0000 mL | Freq: Once | INTRAMUSCULAR | Status: AC | PRN
  Administered 2020-01-18: 75 mL via INTRAVENOUS

## 2020-01-18 NOTE — Progress Notes (Signed)
Hematology/Oncology Consult note [facility] Telephone:([phone] [phone]  Patient Care Team: Langston Reusing, NP as PCP - General (Gerontology) Clent Jacks, RN as Oncology Nurse Navigator   Name of the patient: Johnny Kelley  [id number]  20-Aug-1965    Reason for referral-liver mass   Referring physician- Dr. Allen Norris  Date of visit: 01/18/20   History of presenting illness- Patient is a 54 year old male with past medical history significant for newly diagnosed hepatitis C infection as well as significant alcohol intake.  He was noted to have abnormal LFTs.  In July 2021 he was found to have an elevated bilirubin of 1.9 with an AST ALT of 128 and 86 respectively and underwent a right upper quadrant ultrasound which showed a heterogeneous abnormality in the right hepatic lobe measuring 4.1 x 4.1 x 3.5 cm.  He was then seen by Dr. Allen Norris and underwent MRI liver with and without contrast which showed a 3.8 cm enhancing lesion in segment 6 compatible with [place] lie RADS 5.  He also underwent work-up for abnormal LFTs and was found to have hepatitis C and will be starting treatment for that soon.  He has been referred to [place] for further evaluation of liver mass.  Patient denies any episodes of confusion.  He has not had any repeated episodes of ascites or leg swelling.  He drinks about 2 beers per day.  Appetite and weight have otherwise remained stable.  ECOG PS- 1  Pain scale- 0   Review of systems- Review of Systems  Constitutional: Positive for malaise/fatigue. Negative for chills, fever and weight loss.  HENT: Negative for congestion, ear discharge and nosebleeds.   Eyes: Negative for blurred vision.  Respiratory: Negative for cough, hemoptysis, sputum production, shortness of breath and wheezing.   Cardiovascular: Negative for chest pain, palpitations, orthopnea and claudication.  Gastrointestinal: Negative for abdominal pain, blood in  stool, constipation, diarrhea, heartburn, melena, nausea and vomiting.  Genitourinary: Negative for dysuria, flank pain, frequency, hematuria and urgency.  Musculoskeletal: Negative for back pain, joint pain and myalgias.  Skin: Negative for rash.  Neurological: Negative for dizziness, tingling, focal weakness, seizures, weakness and headaches.  Endo/Heme/Allergies: Does not bruise/bleed easily.  Psychiatric/Behavioral: Negative for depression and suicidal ideas. The patient does not have insomnia.     No Known Allergies  Patient Active Problem List   Diagnosis Date Noted  . Chronic low back pain 12/01/2019  . Avulsed toenail 12/01/2019  . Psoriasis 11/10/2019  . Type 2 diabetes mellitus with diabetic neuropathy, unspecified ([place]) 10/27/2019  . Hypercholesterolemia 10/27/2019  . Elevated lipids 10/27/2019  . Abnormal abdominal ultrasound 10/27/2019  . Alcohol abuse 10/27/2019  . Encounter to establish care 10/27/2019  . Urinary tract infection symptoms 10/27/2019  . Needs assistance with community resources 10/27/2019  . CVA (cerebral vascular accident) ([place]) 09/24/2018  . Cellulitis and abscess 02/22/2015  . Hypertension 02/22/2015  . Abscess 02/22/2015  . Abscess of groin, right   . DM w/ coma type II, uncontrolled ([place])      Past Medical History:  Diagnosis Date  . Diabetes mellitus without complication ([place])   . Hepatitis C   . Hypertension   . Psoriasis   . Stroke [facility])    Hx stroke in 2020     Past Surgical History:  Procedure Laterality Date  . KNEE SURGERY Right     Social History   Socioeconomic History  . Marital status: Widowed    Spouse name: Not on file  .  Number of children: Not on file  . Years of education: Not on file  . Highest education level: Not on file  Occupational History  . Occupation: unemployed  Tobacco Use  . Smoking status: Current Every Day Smoker    Packs/day: 1.00    Years: 38.00    Pack years: 38.00    Types: Cigarettes    . Smokeless tobacco: Never Used  . Tobacco comment: 3-4 cigarettes  per day  Vaping Use  . Vaping Use: Never used  Substance and Sexual Activity  . Alcohol use: Yes    Comment: 2 24 oz beers per day  . Drug use: Not Currently    Types: Cocaine, Marijuana    Comment: reports marijuana use once per month and cocaine use 2-3 times per month  . Sexual activity: Not on file  Other Topics Concern  . Not on file  Social History Narrative  . Not on file   Social Determinants of Health   Financial Resource Strain:   . Difficulty of Paying Living Expenses: Not on file  Food Insecurity:   . Worried About Charity fundraiser in the Last Year: Not on file  . Ran Out of Food in the Last Year: Not on file  Transportation Needs: No Transportation Needs  . Lack of Transportation (Medical): No  . Lack of Transportation (Non-Medical): No  Physical Activity:   . Days of Exercise per Week: Not on file  . Minutes of Exercise per Session: Not on file  Stress: Stress Concern Present  . Feeling of Stress : To some extent  Social Connections: Socially Isolated  . Frequency of Communication with Friends and Family: Never  . Frequency of Social Gatherings with Friends and Family: More than three times a week  . Attends Religious Services: Never  . Active Member of Clubs or Organizations: No  . Attends Archivist Meetings: Never  . Marital Status: Divorced  Human resources officer Violence:   . Fear of Current or Ex-Partner: Not on file  . Emotionally Abused: Not on file  . Physically Abused: Not on file  . Sexually Abused: Not on file     Family History  Problem Relation Age of Onset  . Colon cancer Father   . Diabetes Father   . Diabetes Mother      Current Outpatient Medications:  .  amLODipine (NORVASC) 10 MG tablet, Take 1 tablet (10 mg total) by mouth daily., Disp: 30 tablet, Rfl: 2 .  aspirin 325 MG tablet, Take 1 tablet (325 mg total) by mouth daily., Disp: 120 tablet, Rfl:  0 .  blood glucose meter kit and supplies KIT, Dispense based on patient and insurance preference. Use up to four times daily as directed. (FOR ICD-9 250.00, 250.01)., Disp: 1 each, Rfl: 0 .  gabapentin (NEURONTIN) 100 MG capsule, Take 1 capsule (100 mg total) by mouth at bedtime., Disp: 30 capsule, Rfl: 2 .  glipiZIDE (GLUCOTROL) 5 MG tablet, Take 0.5 tablets (2.5 mg total) by mouth 2 (two) times daily before a meal., Disp: 30 tablet, Rfl: 2 .  metFORMIN (GLUCOPHAGE) 1000 MG tablet, Take 1 tablet (1,000 mg total) by mouth 2 (two) times daily with a meal., Disp: 60 tablet, Rfl: 2 .  triamcinolone cream (KENALOG) 0.1 %, Apply 1 application topically 2 (two) times daily., Disp: 30 g, Rfl: 2 .  nicotine (NICODERM CQ - DOSED IN MG/24 HOURS) 14 mg/24hr patch, Place 1 patch (14 mg total) onto the skin daily. (Patient not  taking: Reported on 12/01/2019), Disp: 28 patch, Rfl: 0   Physical exam:  Vitals:   01/13/20 1348  BP: (!) 145/75  Pulse: 88  Resp: 16  Temp: 97.7 F (36.5 C)  TempSrc: Tympanic  SpO2: 95%  Weight: 221 lb 1.6 oz (100.3 kg)   Physical Exam Constitutional:      General: He is not in acute distress. Eyes:     Pupils: Pupils are equal, round, and reactive to light.  Cardiovascular:     Rate and Rhythm: Normal rate and regular rhythm.     Heart sounds: Normal heart sounds.  Pulmonary:     Effort: Pulmonary effort is normal.     Breath sounds: Normal breath sounds.  Abdominal:     General: Bowel sounds are normal.     Palpations: Abdomen is soft.  Skin:    General: Skin is warm and dry.  Neurological:     Mental Status: He is alert and oriented to person, place, and time.        CMP Latest Ref Rng & Units 01/13/2020  Glucose 70 - 99 mg/dL 117(H)  BUN 6 - 20 mg/dL 15  Creatinine 0.61 - 1.24 mg/dL 0.84  Sodium 135 - 145 mmol/L 137  Potassium 3.5 - 5.1 mmol/L 4.7  Chloride 98 - 111 mmol/L 104  CO2 22 - 32 mmol/L 25  Calcium 8.9 - 10.3 mg/dL 9.3  Total Protein 6.5  - 8.1 g/dL 7.5  Total Bilirubin 0.3 - 1.2 mg/dL 1.6(H)  Alkaline Phos 38 - 126 U/L 110  AST 15 - 41 U/L 70(H)  ALT 0 - 44 U/L 51(H)   CBC Latest Ref Rng & Units 01/13/2020  WBC 4.0 - 10.5 K/uL 5.8  Hemoglobin 13.0 - 17.0 g/dL 14.7  Hematocrit 39 - 52 % 42.5  Platelets 150 - 400 K/uL 105(L)    No images are attached to the encounter.  MR LIVER W WO CONTRAST  Result Date: 12/30/2019 CLINICAL DATA:  Liver lesion on ultrasound EXAM: MRI ABDOMEN WITHOUT AND WITH CONTRAST TECHNIQUE: Multiplanar multisequence MR imaging of the abdomen was performed both before and after the administration of intravenous contrast. CONTRAST:  62m GADAVIST GADOBUTROL 1 MMOL/ML IV SOLN COMPARISON:  Right upper quadrant ultrasound dated 10/10/2019 FINDINGS: Lower chest: Lung bases are clear. Hepatobiliary: Nodular hepatic contour, suggesting cirrhosis, with scattered areas of fibrosis on T2. 3.8 x 3.2 cm enhancing lesion in segment 6 (series 14/image 40), stealthy on T2 (series 10/image 13), with intrinsic T1 hyperintensity (series 12/image 41), arterial enhancement on postcontrast subtraction imaging (series 15/image 41), portal venous washout with a pseudocapsule (series 16/image 27). By definition, this reflects a LI-RADS 5 lesion, and is considered compatible with [place] Gallbladder is unremarkable. No intrahepatic or extrahepatic ductal dilatation. Pancreas:  Within normal limits. Spleen:  Within normal limits. Adrenals/Urinary Tract:  Adrenal glands are within normal limits. Kidneys are within normal limits.  No hydronephrosis. Stomach/Bowel: Stomach is within normal limits. Visualized bowel is unremarkable. Vascular/Lymphatic:  No evidence abdominal aortic aneurysm. Perigastric varices.  Portal vein is patent. Small upper abdominal lymph nodes, likely reactive. Other:  No abdominal ascites. Musculoskeletal: No focal osseous lesions. IMPRESSION: Cirrhosis. 3.8 cm enhancing lesion in segment 6, compatible with HCC. LI-RADS  5. Mild perigastric varices. Spleen is normal in size. Portal vein is patent. No abdominal ascites. Electronically Signed   By: SJulian HyM.D.   On: 12/30/2019 13:04    Assessment and plan- Patient is a 53y.o. male referred for evaluation  of liver mass  Patient does not have any recent CMP PT/INR done to assess his child Pugh score.  Back in July 2021 patient was noted to have an albumin of 3.1 and a total bilirubin of 1.9.  Also his LFTs could also be abnormal including bilirubin given his underlying untreated hepatitis C.  Based on clinical assessment he appears to be a child Pugh A.  I will therefore refer him to Dr. Barry Dienes from [facility] surgery to see if he would be a candidate for resection.  Given the MRI appearance was characteristic for [facility] he does not need a biopsy to confirm this.  Patient also has an appointment with interventional radiology for consideration of ablation but I think prior to that we will need to see if he is a candidate for resection.  If he is not deemed to be a candidate for resection, liver transplantation is also an option.  However given his ongoing active alcohol intake I doubt that he would be a candidate for liver transplant.  MRI did not show any evidence of portal venous thrombosis.  I will obtain a CT chest without contrast to complete his staging work-up.  I will see him back in person or for a video visit after CT scans and labs today.  Baseline AFP was normal  Thank you for this kind referral and the opportunity to participate in the care of this  Patient   Visit Diagnosis 1. Hepatocellular carcinoma ([place])     Dr. Randa Evens, MD, MPH [facility] [phone] 01/18/2020

## 2020-01-19 ENCOUNTER — Other Ambulatory Visit: Payer: Self-pay

## 2020-01-19 ENCOUNTER — Ambulatory Visit
Admission: RE | Admit: 2020-01-19 | Discharge: 2020-01-19 | Disposition: A | Discharge status: Home / Self Care | Payer: Self-pay | Source: Ambulatory Visit | Attending: Gastroenterology | Admitting: Gastroenterology

## 2020-01-19 ENCOUNTER — Encounter: Payer: Self-pay | Admitting: *Deleted

## 2020-01-19 DIAGNOSIS — K769 Liver disease, unspecified: Secondary | ICD-10-CM

## 2020-01-19 HISTORY — PX: IR RADIOLOGIST EVAL & MGMT: IMG5224

## 2020-01-19 NOTE — Consult Note (Signed)
Chief Complaint: [facility]  Referring Physician(s): Wohl,Darren (GI) Janese Banks (Oncology) Barry Dienes (Hepatobiliary Surgery)  History of Present Illness: Johnny Kelley is a 54 y.o. male with past medical history significant for diabetes, hypertension, stroke and hepatitis C found to have a indeterminate liver lesion on right upper quadrant abdominal ultrasound performed 10/10/2019 for evaluation of elevated LFTs.  Lesion was further evaluated on dedicated abdominal MRI performed 12/30/2019 which demonstrated an approximately 3.8 x 3.2 cm lesion within the right lobe of the liver which demonstrated imaging characteristics compatible with a hepatocellular carcinoma (note, the lesion was found estimated to measure approximately 4.3 x 3.8 cm on the right upper quadrant abdominal ultrasound.)  Subsequent staging chest CT performed 01/18/2020 was negative for evidence of pulmonary metastatic disease.  Patient is seen today in consultation for evaluation of the hepatocellular carcinoma.  The patient is accompanied on the phone call by his brother Dominica Severin who serves as the primary historian.  The patient reports fatigue but is otherwise asymptomatic in regards to this instantly discovered liver lesion.  Specifically, no yellowing of the skin or eyes.  No increased abdominal girth.  No unintentional weight loss or weight gain.  Per the patient's brother Dominica Severin, the patient continues to actively drink alcohol, consuming approximately a 12 pack of beer per day.  The patient lives next door to Dominica Severin though relies on his brother for the majority of higher functioning tasks.   Past Medical History:  Diagnosis Date  . Diabetes mellitus without complication ([place])   . Hepatitis C   . Hypertension   . Psoriasis   . Stroke [facility])    Hx stroke in 2020    Past Surgical History:  Procedure Laterality Date  . KNEE SURGERY Right     Allergies: Patient has no known allergies.  Medications: Prior to Admission  medications   Medication Sig Start Date End Date Taking? Authorizing Provider  amLODipine (NORVASC) 10 MG tablet Take 1 tablet (10 mg total) by mouth daily. 12/01/19 01/30/20  Iloabachie, Chioma E, NP  aspirin 325 MG tablet Take 1 tablet (325 mg total) by mouth daily. 11/10/19   Iloabachie, Chioma E, NP  blood glucose meter kit and supplies KIT Dispense based on patient and insurance preference. Use up to four times daily as directed. (FOR ICD-9 250.00, 250.01). 10/27/19   Iloabachie, Chioma E, NP  gabapentin (NEURONTIN) 100 MG capsule Take 1 capsule (100 mg total) by mouth at bedtime. 12/01/19   Iloabachie, Chioma E, NP  glipiZIDE (GLUCOTROL) 5 MG tablet Take 0.5 tablets (2.5 mg total) by mouth 2 (two) times daily before a meal. 12/01/19   Iloabachie, Chioma E, NP  metFORMIN (GLUCOPHAGE) 1000 MG tablet Take 1 tablet (1,000 mg total) by mouth 2 (two) times daily with a meal. 12/01/19   Iloabachie, Chioma E, NP  nicotine (NICODERM CQ - DOSED IN MG/24 HOURS) 14 mg/24hr patch Place 1 patch (14 mg total) onto the skin daily. Patient not taking: Reported on 12/01/2019 09/26/18   Bettey Costa, MD  triamcinolone cream (KENALOG) 0.1 % Apply 1 application topically 2 (two) times daily. 12/01/19   Iloabachie, Chioma E, NP     Family History  Problem Relation Age of Onset  . Colon cancer Father   . Diabetes Father   . Diabetes Mother     Social History   Socioeconomic History  . Marital status: Widowed    Spouse name: Not on file  . Number of children: Not on file  . Years of  education: Not on file  . Highest education level: Not on file  Occupational History  . Occupation: unemployed  Tobacco Use  . Smoking status: Current Every Day Smoker    Packs/day: 1.00    Years: 38.00    Pack years: 38.00    Types: Cigarettes  . Smokeless tobacco: Never Used  . Tobacco comment: 3-4 cigarettes  per day  Vaping Use  . Vaping Use: Never used  Substance and Sexual Activity  . Alcohol use: Yes    Comment: 2 24 oz  beers per day  . Drug use: Not Currently    Types: Cocaine, Marijuana    Comment: reports marijuana use once per month and cocaine use 2-3 times per month  . Sexual activity: Not on file  Other Topics Concern  . Not on file  Social History Narrative  . Not on file   Social Determinants of Health   Financial Resource Strain:   . Difficulty of Paying Living Expenses: Not on file  Food Insecurity:   . Worried About Charity fundraiser in the Last Year: Not on file  . Ran Out of Food in the Last Year: Not on file  Transportation Needs: No Transportation Needs  . Lack of Transportation (Medical): No  . Lack of Transportation (Non-Medical): No  Physical Activity:   . Days of Exercise per Week: Not on file  . Minutes of Exercise per Session: Not on file  Stress: Stress Concern Present  . Feeling of Stress : To some extent  Social Connections: Socially Isolated  . Frequency of Communication with Friends and Family: Never  . Frequency of Social Gatherings with Friends and Family: More than three times a week  . Attends Religious Services: Never  . Active Member of Clubs or Organizations: No  . Attends Archivist Meetings: Never  . Marital Status: Divorced    ECOG Status: 1 - Symptomatic but completely ambulatory  Review of Systems  Review of Systems: A 12 point ROS discussed and pertinent positives are indicated in the HPI above.  All other systems are negative.  Physical Exam No direct physical exam was performed (except for noted visual exam findings with Video Visits).   Vital Signs: There were no vitals taken for this visit.  Imaging: CT Chest W Contrast  Result Date: 01/18/2020 CLINICAL DATA:  Evaluate for pulmonary metastatic disease, hepatocellular carcinoma recently diagnosed by imaging EXAM: CT CHEST WITH CONTRAST TECHNIQUE: Multidetector CT imaging of the chest was performed during intravenous contrast administration. CONTRAST:  74m OMNIPAQUE IOHEXOL 300  MG/ML  SOLN COMPARISON:  MR abdomen, 12/30/2019 FINDINGS: Cardiovascular: Aortic atherosclerosis. Normal heart size. Extensive 3 vessel coronary artery calcifications and/or stents. No pericardial effusion. Mediastinum/Nodes: No enlarged mediastinal, hilar, or axillary lymph nodes. Thyroid gland, trachea, and esophagus demonstrate no significant findings. Lungs/Pleura: Lungs are clear. No pleural effusion or pneumothorax. Upper Abdomen: No acute abnormality. Cirrhotic morphology of the liver. Liver lesion of hepatic segment VI is only very faintly appreciated on this single phase examination (series 2, image 158). Cholelithiasis with multiple small gallstones near the gallbladder neck. Musculoskeletal: No chest wall mass or suspicious bone lesions identified. IMPRESSION: 1. No evidence of metastatic disease in the chest. 2. Cirrhotic morphology of the liver. Liver lesion of hepatic segment VI is only very faintly appreciated on this single contrast phase examination and best characterized by recent MR. 3. Cholelithiasis. 4. Coronary artery disease.  Aortic Atherosclerosis (ICD10-I70.0). Electronically Signed   By: ADorna BloomD.  On: 01/18/2020 16:03   MR LIVER W WO CONTRAST  Result Date: 12/30/2019 CLINICAL DATA:  Liver lesion on ultrasound EXAM: MRI ABDOMEN WITHOUT AND WITH CONTRAST TECHNIQUE: Multiplanar multisequence MR imaging of the abdomen was performed both before and after the administration of intravenous contrast. CONTRAST:  17m GADAVIST GADOBUTROL 1 MMOL/ML IV SOLN COMPARISON:  Right upper quadrant ultrasound dated 10/10/2019 FINDINGS: Lower chest: Lung bases are clear. Hepatobiliary: Nodular hepatic contour, suggesting cirrhosis, with scattered areas of fibrosis on T2. 3.8 x 3.2 cm enhancing lesion in segment 6 (series 14/image 40), stealthy on T2 (series 10/image 13), with intrinsic T1 hyperintensity (series 12/image 41), arterial enhancement on postcontrast subtraction imaging (series  15/image 41), portal venous washout with a pseudocapsule (series 16/image 27). By definition, this reflects a LI-RADS 5 lesion, and is considered compatible with HSalley Gallbladder is unremarkable. No intrahepatic or extrahepatic ductal dilatation. Pancreas:  Within normal limits. Spleen:  Within normal limits. Adrenals/Urinary Tract:  Adrenal glands are within normal limits. Kidneys are within normal limits.  No hydronephrosis. Stomach/Bowel: Stomach is within normal limits. Visualized bowel is unremarkable. Vascular/Lymphatic:  No evidence abdominal aortic aneurysm. Perigastric varices.  Portal vein is patent. Small upper abdominal lymph nodes, likely reactive. Other:  No abdominal ascites. Musculoskeletal: No focal osseous lesions. IMPRESSION: Cirrhosis. 3.8 cm enhancing lesion in segment 6, compatible with HCC. LI-RADS 5. Mild perigastric varices. Spleen is normal in size. Portal vein is patent. No abdominal ascites. Electronically Signed   By: SJulian HyM.D.   On: 12/30/2019 13:04    Labs:  CBC: Recent Labs    10/10/19 0806 01/13/20 1433  WBC 6.7 5.8  HGB 15.8 14.7  HCT 44.9 42.5  PLT 104* 105*    COAGS: Recent Labs    10/10/19 0806 01/13/20 1433  INR 1.1 1.1  APTT 32  --     BMP: Recent Labs    10/10/19 0806 01/13/20 1433  NA 131* 137  K 4.1 4.7  CL 101 104  CO2 23 25  GLUCOSE 302* 117*  BUN 12 15  CALCIUM 8.8* 9.3  CREATININE 0.78 0.84  GFRNONAA >60 >60  GFRAA >60  --     LIVER FUNCTION TESTS: Recent Labs    10/10/19 0806 01/13/20 1433  BILITOT 1.9* 1.6*  AST 128* 70*  ALT 86* 51*  ALKPHOS 179* 110  PROT 7.8 7.5  ALBUMIN 3.1* 3.5    TUMOR MARKERS: No results for input(s): AFPTM, CEA, CA199, CHROMGRNA in the last 8760 hours.  Assessment and Plan:  TCHESTON COURYis a 54y.o. male with past medical history significant for diabetes, hypertension, stroke and hepatitis C found to have a indeterminate liver lesion on right upper quadrant  abdominal ultrasound performed 10/10/2019 for evaluation of elevated LFTs.  Lesion was further evaluated on dedicated abdominal MRI performed 12/30/2019 which demonstrated an approximately 3.8 x 3.2 cm lesion within the right lobe of the liver which demonstrated imaging characteristics compatible with a hepatocellular carcinoma (note, the lesion was found estimated to measure approximately 4.3 x 3.8 cm on the right upper quadrant abdominal ultrasound.)  Subsequent staging chest CT performed 01/18/2020 was negative for evidence of pulmonary metastatic disease.  Patient is seen today in consultation for evaluation of the hepatocellular carcinoma.  The patient is accompanied on the phone call by his brother GDominica Severinwho serves as the primary historian.  The patient reports fatigue but is otherwise asymptomatic in regards to this instantly discovered liver lesion.    Patient has a  surgical consultation with Dr. Barry Dienes scheduled for this coming Monday, 10/25.  As hepatic resection is the gold standard for hepatocellular carcinoma (as patient is not a transplant candidate given continued alcohol assumption), I will defer any additional recommendations until Dr. Barry Dienes has a opportunity to evaluate the patient's surgical candidacy.  (Ultimately, it is uncertain whether the patient will wish to pursue intervention based on my candid discussion with the patient's brother.  IF the patient does wish to pursue liver directed therapy, I would likely offer transcatheter bland hepatic embolization in hopes of reducing the size of the hepatocellular carcinoma to less than 3 cm with then plans to perform a image guided hepatic ablation at a later date.)  Plan: - The patient to maintain surgical consultation with Dr. Barry Dienes scheduled for this coming Monday. - IF the patient is ultimately deemed NOT an operative candidate and wishes to undergo liver directed therapy, will have another consultation with the patient and the  patient's brother to have a more detailed conversation regarding potential treatment options.   Thank you for this interesting consult.  I greatly enjoyed meeting KEARNEY EVITT and look forward to participating in their care.  A copy of this report was sent to the requesting provider on this date.  Electronically Signed: Sandi Mariscal 01/19/2020, 9:16 AM   I spent a total of 30 Minutes in remote  clinical consultation, greater than 50% of which was counseling/coordinating care for [facility].    Visit type: Audio only (telephone). Audio (no video) only due to patient's lack of internet/smartphone capability. Alternative for in-person consultation at [facility], [address], [place], [place]. This visit type was conducted due to national recommendations for restrictions regarding the COVID-19 Pandemic (e.g. social distancing).  This format is felt to be most appropriate for this patient at this time.  All issues noted in this document were discussed and addressed.

## 2020-01-20 ENCOUNTER — Other Ambulatory Visit: Payer: Self-pay

## 2020-01-20 ENCOUNTER — Encounter: Payer: Self-pay | Admitting: Oncology

## 2020-01-20 ENCOUNTER — Inpatient Hospital Stay (HOSPITAL_BASED_OUTPATIENT_CLINIC_OR_DEPARTMENT_OTHER): Payer: Medicaid Other | Admitting: Oncology

## 2020-01-20 VITALS — BP 151/73 | HR 86 | Temp 98.7°F | Resp 16 | Ht 73.0 in | Wt 219.5 lb

## 2020-01-20 DIAGNOSIS — C220 Liver cell carcinoma: Secondary | ICD-10-CM

## 2020-01-20 DIAGNOSIS — Z7189 Other specified counseling: Secondary | ICD-10-CM

## 2020-01-20 NOTE — Progress Notes (Signed)
Here to get scan results. Pt has some trouble with liquids causing him to choke sometimes and cough. Solid food good, no pain today. Eating good. Good BM and good sleep each night

## 2020-01-22 DIAGNOSIS — C220 Liver cell carcinoma: Secondary | ICD-10-CM | POA: Insufficient documentation

## 2020-01-22 DIAGNOSIS — Z7189 Other specified counseling: Secondary | ICD-10-CM | POA: Insufficient documentation

## 2020-01-22 NOTE — Progress Notes (Signed)
Hematology/Oncology Consult note [facility]  [phone] [phone]  Patient Care Team: Langston Reusing, NP as PCP - General (Gerontology) Clent Jacks, RN as Oncology Nurse Navigator   Name of the patient: Johnny Kelley  [id number]  [birth year]   Date of visit: 01/22/20  Diagnosis-hepatocellular carcinoma  Chief complaint/ Reason for visit-discuss CT and blood work results  Heme/Onc history: Patient is a 54 year old male with past medical history significant for newly diagnosed hepatitis C infection as well as significant alcohol intake.  He was noted to have abnormal LFTs.  In July 2021 he was found to have an elevated bilirubin of 1.9 with an AST ALT of 128 and 86 respectively and underwent a right upper quadrant ultrasound which showed a heterogeneous abnormality in the right hepatic lobe measuring 4.1 x 4.1 x 3.5 cm.  He was then seen by Dr. Allen Norris and underwent MRI liver with and without contrast which showed a 3.8 cm enhancing lesion in segment 6 compatible with [place] lie RADS 5.  He also underwent work-up for abnormal LFTs and was found to have hepatitis C and will be starting treatment for that soon.  He has been referred to [place] for further evaluation of liver mass.  Results of blood work from 01/13/2020 reveal a normal albumin level of 3.5.  AST and ALT mildly elevated at 70 and 51 respectively with a total bilirubin of 1.6.  PT/INR was normal.  Platelet count was mildly low at 105.  CT chest with contrast did not reveal any evidence of metastatic disease   Interval history-no new complaints since last time.  Patient is yet to start his treatment for hepatitis C.  He also has an appointment with Dr. Barry Dienes to discuss about resection of liver mass  ECOG PS- 1 Pain scale- 0   Review of systems- Review of Systems  Constitutional: Negative for chills, fever, malaise/fatigue and weight loss.  HENT: Negative for congestion,  ear discharge and nosebleeds.   Eyes: Negative for blurred vision.  Respiratory: Negative for cough, hemoptysis, sputum production, shortness of breath and wheezing.   Cardiovascular: Negative for chest pain, palpitations, orthopnea and claudication.  Gastrointestinal: Negative for abdominal pain, blood in stool, constipation, diarrhea, heartburn, melena, nausea and vomiting.  Genitourinary: Negative for dysuria, flank pain, frequency, hematuria and urgency.  Musculoskeletal: Negative for back pain, joint pain and myalgias.  Skin: Negative for rash.  Neurological: Negative for dizziness, tingling, focal weakness, seizures, weakness and headaches.  Endo/Heme/Allergies: Does not bruise/bleed easily.  Psychiatric/Behavioral: Negative for depression and suicidal ideas. The patient does not have insomnia.        No Known Allergies   Past Medical History:  Diagnosis Date  . Cancer, hepatocellular ([place])   . Diabetes mellitus without complication ([place])   . Hepatitis C   . Hypertension   . Psoriasis   . Stroke [facility])    Hx stroke in 2020     Past Surgical History:  Procedure Laterality Date  . IR RADIOLOGIST EVAL & MGMT  01/19/2020  . KNEE SURGERY Right     Social History   Socioeconomic History  . Marital status: Widowed    Spouse name: Not on file  . Number of children: Not on file  . Years of education: Not on file  . Highest education level: Not on file  Occupational History  . Occupation: unemployed  Tobacco Use  . Smoking status: Current Every Day Smoker    Packs/day: 1.00  Years: 38.00    Pack years: 38.00    Types: Cigarettes  . Smokeless tobacco: Never Used  Vaping Use  . Vaping Use: Never used  Substance and Sexual Activity  . Alcohol use: Yes    Comment: 2 24 oz beers per day  . Drug use: Yes    Types: Cocaine, Marijuana    Comment: reports marijuana use once per month and cocaine use 2-3 times per month  . Sexual activity: Not on file  Other Topics  Concern  . Not on file  Social History Narrative  . Not on file   Social Determinants of Health   Financial Resource Strain:   . Difficulty of Paying Living Expenses: Not on file  Food Insecurity:   . Worried About Charity fundraiser in the Last Year: Not on file  . Ran Out of Food in the Last Year: Not on file  Transportation Needs: No Transportation Needs  . Lack of Transportation (Medical): No  . Lack of Transportation (Non-Medical): No  Physical Activity:   . Days of Exercise per Week: Not on file  . Minutes of Exercise per Session: Not on file  Stress: Stress Concern Present  . Feeling of Stress : To some extent  Social Connections: Socially Isolated  . Frequency of Communication with Friends and Family: Never  . Frequency of Social Gatherings with Friends and Family: More than three times a week  . Attends Religious Services: Never  . Active Member of Clubs or Organizations: No  . Attends Archivist Meetings: Never  . Marital Status: Divorced  Human resources officer Violence:   . Fear of Current or Ex-Partner: Not on file  . Emotionally Abused: Not on file  . Physically Abused: Not on file  . Sexually Abused: Not on file    Family History  Problem Relation Age of Onset  . Colon cancer Father   . Diabetes Father   . Diabetes Mother      Current Outpatient Medications:  .  amLODipine (NORVASC) 10 MG tablet, Take 1 tablet (10 mg total) by mouth daily., Disp: 30 tablet, Rfl: 2 .  aspirin 325 MG tablet, Take 1 tablet (325 mg total) by mouth daily., Disp: 120 tablet, Rfl: 0 .  blood glucose meter kit and supplies KIT, Dispense based on patient and insurance preference. Use up to four times daily as directed. (FOR ICD-9 250.00, 250.01)., Disp: 1 each, Rfl: 0 .  gabapentin (NEURONTIN) 100 MG capsule, Take 1 capsule (100 mg total) by mouth at bedtime., Disp: 30 capsule, Rfl: 2 .  glipiZIDE (GLUCOTROL) 5 MG tablet, Take 0.5 tablets (2.5 mg total) by mouth 2 (two) times  daily before a meal., Disp: 30 tablet, Rfl: 2 .  metFORMIN (GLUCOPHAGE) 1000 MG tablet, Take 1 tablet (1,000 mg total) by mouth 2 (two) times daily with a meal., Disp: 60 tablet, Rfl: 2 .  triamcinolone cream (KENALOG) 0.1 %, Apply 1 application topically 2 (two) times daily., Disp: 30 g, Rfl: 2 .  nicotine (NICODERM CQ - DOSED IN MG/24 HOURS) 14 mg/24hr patch, Place 1 patch (14 mg total) onto the skin daily. (Patient not taking: Reported on 12/01/2019), Disp: 28 patch, Rfl: 0  Physical exam:  Vitals:   01/20/20 1309  BP: (!) 151/73  Pulse: 86  Resp: 16  Temp: 98.7 F (37.1 C)  TempSrc: Tympanic  Weight: 219 lb 8 oz (99.6 kg)  Height: 6' 1" (1.854 m)   Physical Exam Constitutional:  General: He is not in acute distress. Cardiovascular:     Rate and Rhythm: Normal rate and regular rhythm.     Heart sounds: Normal heart sounds.  Pulmonary:     Effort: Pulmonary effort is normal.     Breath sounds: Normal breath sounds.  Abdominal:     General: Bowel sounds are normal.     Palpations: Abdomen is soft.  Skin:    General: Skin is warm and dry.  Neurological:     Mental Status: He is alert and oriented to person, place, and time.      CMP Latest Ref Rng & Units 01/13/2020  Glucose 70 - 99 mg/dL 117(H)  BUN 6 - 20 mg/dL 15  Creatinine 0.61 - 1.24 mg/dL 0.84  Sodium 135 - 145 mmol/L 137  Potassium 3.5 - 5.1 mmol/L 4.7  Chloride 98 - 111 mmol/L 104  CO2 22 - 32 mmol/L 25  Calcium 8.9 - 10.3 mg/dL 9.3  Total Protein 6.5 - 8.1 g/dL 7.5  Total Bilirubin 0.3 - 1.2 mg/dL 1.6(H)  Alkaline Phos 38 - 126 U/L 110  AST 15 - 41 U/L 70(H)  ALT 0 - 44 U/L 51(H)   CBC Latest Ref Rng & Units 01/13/2020  WBC 4.0 - 10.5 K/uL 5.8  Hemoglobin 13.0 - 17.0 g/dL 14.7  Hematocrit 39 - 52 % 42.5  Platelets 150 - 400 K/uL 105(L)    No images are attached to the encounter.  CT Chest W Contrast  Result Date: 01/18/2020 CLINICAL DATA:  Evaluate for pulmonary metastatic disease,  hepatocellular carcinoma recently diagnosed by imaging EXAM: CT CHEST WITH CONTRAST TECHNIQUE: Multidetector CT imaging of the chest was performed during intravenous contrast administration. CONTRAST:  37m OMNIPAQUE IOHEXOL 300 MG/ML  SOLN COMPARISON:  MR abdomen, 12/30/2019 FINDINGS: Cardiovascular: Aortic atherosclerosis. Normal heart size. Extensive 3 vessel coronary artery calcifications and/or stents. No pericardial effusion. Mediastinum/Nodes: No enlarged mediastinal, hilar, or axillary lymph nodes. Thyroid gland, trachea, and esophagus demonstrate no significant findings. Lungs/Pleura: Lungs are clear. No pleural effusion or pneumothorax. Upper Abdomen: No acute abnormality. Cirrhotic morphology of the liver. Liver lesion of hepatic segment VI is only very faintly appreciated on this single phase examination (series 2, image 158). Cholelithiasis with multiple small gallstones near the gallbladder neck. Musculoskeletal: No chest wall mass or suspicious bone lesions identified. IMPRESSION: 1. No evidence of metastatic disease in the chest. 2. Cirrhotic morphology of the liver. Liver lesion of hepatic segment VI is only very faintly appreciated on this single contrast phase examination and best characterized by recent MR. 3. Cholelithiasis. 4. Coronary artery disease.  Aortic Atherosclerosis (ICD10-I70.0). Electronically Signed   By: AEddie CandleM.D.   On: 01/18/2020 16:03   MR LIVER W WO CONTRAST  Result Date: 12/30/2019 CLINICAL DATA:  Liver lesion on ultrasound EXAM: MRI ABDOMEN WITHOUT AND WITH CONTRAST TECHNIQUE: Multiplanar multisequence MR imaging of the abdomen was performed both before and after the administration of intravenous contrast. CONTRAST:  180mGADAVIST GADOBUTROL 1 MMOL/ML IV SOLN COMPARISON:  Right upper quadrant ultrasound dated 10/10/2019 FINDINGS: Lower chest: Lung bases are clear. Hepatobiliary: Nodular hepatic contour, suggesting cirrhosis, with scattered areas of fibrosis on T2.  3.8 x 3.2 cm enhancing lesion in segment 6 (series 14/image 40), stealthy on T2 (series 10/image 13), with intrinsic T1 hyperintensity (series 12/image 41), arterial enhancement on postcontrast subtraction imaging (series 15/image 41), portal venous washout with a pseudocapsule (series 16/image 27). By definition, this reflects a LI-RADS 5 lesion, and is considered compatible  with [place]. Gallbladder is unremarkable. No intrahepatic or extrahepatic ductal dilatation. Pancreas:  Within normal limits. Spleen:  Within normal limits. Adrenals/Urinary Tract:  Adrenal glands are within normal limits. Kidneys are within normal limits.  No hydronephrosis. Stomach/Bowel: Stomach is within normal limits. Visualized bowel is unremarkable. Vascular/Lymphatic:  No evidence abdominal aortic aneurysm. Perigastric varices.  Portal vein is patent. Small upper abdominal lymph nodes, likely reactive. Other:  No abdominal ascites. Musculoskeletal: No focal osseous lesions. IMPRESSION: Cirrhosis. 3.8 cm enhancing lesion in segment 6, compatible with HCC. LI-RADS 5. Mild perigastric varices. Spleen is normal in size. Portal vein is patent. No abdominal ascites. Electronically Signed   By: Julian Hy M.D.   On: 12/30/2019 13:04   IR Radiologist Eval & Mgmt  Result Date: 01/19/2020 Please refer to notes tab for details about interventional procedure. (Op Note)    Assessment and plan- Patient is a 54 y.o. male with history of alcohol and hep C related cirrhosis and new diagnosis of hepatocellular carcinoma involving segment 6 of the liver isolated lesion here to discuss further management  Discussed the results of CT chest with the patient did not reveal any evidence of metastatic disease.  Based on his labs CBC with differential CMP and PT/INR patient is a child Pugh A.  He has an appointment with Dr. Barry Dienes to discuss liver resection.  He was also seen by interventional radiology Dr. Pascal Lux and plan is to consider liver  directed therapy if he is not deemed to be a candidate for surgery.  He is unlikely to be a candidate for transplant given his ongoing alcohol intake.  At this time I will await further input by Dr. Barry Dienes to decide about his subsequent follow-up.  He will also need to follow-up with Dr. Allen Norris for treatment of his hepatitis C.  Visit Diagnosis 1. Hepatocellular carcinoma ([place])   2. Goals of care, counseling/discussion      Dr. Randa Evens, MD, MPH [facility] [phone] 01/22/2020 10:42 AM

## 2020-01-23 DIAGNOSIS — E119 Type 2 diabetes mellitus without complications: Secondary | ICD-10-CM | POA: Diagnosis not present

## 2020-01-23 DIAGNOSIS — C220 Liver cell carcinoma: Secondary | ICD-10-CM | POA: Diagnosis not present

## 2020-01-23 DIAGNOSIS — B1920 Unspecified viral hepatitis C without hepatic coma: Secondary | ICD-10-CM | POA: Diagnosis not present

## 2020-01-23 DIAGNOSIS — K7030 Alcoholic cirrhosis of liver without ascites: Secondary | ICD-10-CM | POA: Diagnosis not present

## 2020-01-23 DIAGNOSIS — G8191 Hemiplegia, unspecified affecting right dominant side: Secondary | ICD-10-CM | POA: Diagnosis not present

## 2020-01-23 DIAGNOSIS — Z8673 Personal history of transient ischemic attack (TIA), and cerebral infarction without residual deficits: Secondary | ICD-10-CM | POA: Diagnosis not present

## 2020-01-24 ENCOUNTER — Other Ambulatory Visit: Payer: Self-pay | Admitting: Interventional Radiology

## 2020-01-24 ENCOUNTER — Telehealth: Payer: Self-pay

## 2020-01-24 DIAGNOSIS — R160 Hepatomegaly, not elsewhere classified: Secondary | ICD-10-CM

## 2020-01-24 NOTE — Telephone Encounter (Signed)
Called and spoke with brother, Johnny Kelley. Johnny Kelley has been seen by surgery and he is not a candidate for surgery. He is interested in talking with IR further regarding liver directed therapy. Voicemail left with [facility] to reach out to [place] to schedule a follow up appointment.

## 2020-01-25 ENCOUNTER — Other Ambulatory Visit: Payer: Self-pay

## 2020-01-25 DIAGNOSIS — N390 Urinary tract infection, site not specified: Secondary | ICD-10-CM

## 2020-01-25 DIAGNOSIS — E1140 Type 2 diabetes mellitus with diabetic neuropathy, unspecified: Secondary | ICD-10-CM

## 2020-01-25 NOTE — Progress Notes (Signed)
How much does he drink and is he willing to stop?

## 2020-01-26 ENCOUNTER — Telehealth: Payer: Self-pay

## 2020-01-26 LAB — UA/M W/RFLX CULTURE, ROUTINE
Bilirubin, UA: NEGATIVE
Glucose, UA: NEGATIVE
Ketones, UA: NEGATIVE
Leukocytes,UA: NEGATIVE
Nitrite, UA: NEGATIVE
Protein,UA: NEGATIVE
RBC, UA: NEGATIVE
Specific Gravity, UA: 1.014 (ref 1.005–1.030)
Urobilinogen, Ur: 1 mg/dL (ref 0.2–1.0)
pH, UA: 7.5 (ref 5.0–7.5)

## 2020-01-26 LAB — MICROSCOPIC EXAMINATION
Bacteria, UA: NONE SEEN
Casts: NONE SEEN /lpf
Epithelial Cells (non renal): NONE SEEN /hpf (ref 0–10)
WBC, UA: NONE SEEN /hpf (ref 0–5)

## 2020-01-26 LAB — HEMOGLOBIN A1C
Est. average glucose Bld gHb Est-mCnc: 117 mg/dL
Hgb A1c MFr Bld: 5.7 % — ABNORMAL HIGH (ref 4.8–5.6)

## 2020-01-26 NOTE — Telephone Encounter (Signed)
Spoke with pt's brother regarding alcohol use. He will have his brother contact our office to discuss how much and if he would be willing to stop while on treatment.

## 2020-01-26 NOTE — Telephone Encounter (Signed)
-----   Message from [name], MD sent at 01/25/2020  9:12 PM EDT ----- How much does he drink and is he willing to stop?

## 2020-01-31 ENCOUNTER — Other Ambulatory Visit: Payer: Self-pay

## 2020-01-31 ENCOUNTER — Ambulatory Visit
Admission: RE | Admit: 2020-01-31 | Discharge: 2020-01-31 | Disposition: A | Discharge status: Home / Self Care | Payer: Medicaid Other | Source: Ambulatory Visit | Attending: Interventional Radiology | Admitting: Interventional Radiology

## 2020-01-31 ENCOUNTER — Encounter: Payer: Self-pay | Admitting: Gerontology

## 2020-01-31 ENCOUNTER — Ambulatory Visit: Payer: Medicaid Other | Admitting: Gerontology

## 2020-01-31 ENCOUNTER — Other Ambulatory Visit: Payer: Self-pay | Admitting: Interventional Radiology

## 2020-01-31 VITALS — BP 158/78 | HR 81 | Temp 98.0°F | Resp 16 | Wt 225.9 lb

## 2020-01-31 DIAGNOSIS — R160 Hepatomegaly, not elsewhere classified: Secondary | ICD-10-CM

## 2020-01-31 DIAGNOSIS — I10 Essential (primary) hypertension: Secondary | ICD-10-CM

## 2020-01-31 DIAGNOSIS — E1140 Type 2 diabetes mellitus with diabetic neuropathy, unspecified: Secondary | ICD-10-CM

## 2020-01-31 DIAGNOSIS — K769 Liver disease, unspecified: Secondary | ICD-10-CM

## 2020-01-31 DIAGNOSIS — C220 Liver cell carcinoma: Secondary | ICD-10-CM

## 2020-01-31 DIAGNOSIS — L409 Psoriasis, unspecified: Secondary | ICD-10-CM

## 2020-01-31 DIAGNOSIS — R229 Localized swelling, mass and lump, unspecified: Secondary | ICD-10-CM

## 2020-01-31 HISTORY — PX: IR RADIOLOGIST EVAL & MGMT: IMG5224

## 2020-01-31 MED ORDER — METFORMIN HCL 1000 MG PO TABS: 1000.0000 mg | ORAL_TABLET | Freq: Two times a day (BID) | ORAL | 2 refills | Status: DC

## 2020-01-31 MED ORDER — GABAPENTIN 100 MG PO CAPS: 100.0000 mg | ORAL_CAPSULE | Freq: Every day | ORAL | 2 refills | Status: DC

## 2020-01-31 MED ORDER — AMLODIPINE BESYLATE 10 MG PO TABS: 10.0000 mg | ORAL_TABLET | Freq: Every day | ORAL | 2 refills | Status: DC

## 2020-01-31 MED ORDER — GLIPIZIDE 5 MG PO TABS: 2.5000 mg | ORAL_TABLET | Freq: Every day | ORAL | 2 refills | Status: DC

## 2020-01-31 MED ORDER — LOSARTAN POTASSIUM 25 MG PO TABS: 25.0000 mg | ORAL_TABLET | Freq: Every day | ORAL | 0 refills | Status: DC

## 2020-01-31 NOTE — Progress Notes (Signed)
Patient ID: Johnny Kelley, male   DOB: 16-Sep-1965, 54 y.o.   MRN: [id number]         Chief Complaint: [facility]  Referring Physician(s): Seeley Southgate,JohnWohl,Darren (GI) Janese Banks (Oncology) Barry Dienes (Hepatobiliary Surgery)  History of Present Illness:  Johnny Kelley is a 54 y.o. male with past medical history significant for diabetes, hypertension, stroke and hepatitis C found to have a indeterminate liver lesion on right upper quadrant abdominal ultrasound performed 10/10/2019 for evaluation of elevated LFTs.  Lesion was further evaluated on dedicated abdominal MRI performed 12/30/2019 which demonstrated an approximately 3.8 x 3.2 cm lesion within the right lobe of the liver which demonstrated imaging characteristics compatible with a hepatocellular carcinoma (note, the lesion was found estimated to measure approximately 4.3 x 3.8 cm on the right upper quadrant abdominal ultrasound.)  Subsequent staging chest CT performed 01/18/2020 was negative for evidence of pulmonary metastatic disease.  The patient is seen today in repeat consultation for evaluation of the hepatocellular carcinoma after having been evaluated by Dr. Barry Dienes and deemed NOT an operative candidate due to his continued alcohol consumption.    Consultation is held today in concert with his brother Johnny Kelley, who serves as the primary historian.  The patient reports fatigue but is otherwise asymptomatic in regards to this incidentally discovered liver lesion.  Specifically, no yellowing of the skin or eyes.  No increased abdominal girth.  No unintentional weight loss or weight gain.  Per the patient's brother Johnny Kelley, the patient continues to actively drink alcohol, though has recently cut back on his drinking at the urging of his recent consultation.  The patient lives next door to Johnny Kelley though relies on his brother for the majority of higher functioning tasks.  The patient would like to pursue treatment though prefers to undergo the least  invasive options available.    Past Medical History:  Diagnosis Date  . Cancer, hepatocellular ([place])   . Diabetes mellitus without complication ([place])   . Hepatitis C   . Hypertension   . Psoriasis   . Stroke [facility])    Hx stroke in 2020    Past Surgical History:  Procedure Laterality Date  . IR RADIOLOGIST EVAL & MGMT  01/19/2020  . KNEE SURGERY Right     Allergies: Patient has no known allergies.  Medications: Prior to Admission medications   Medication Sig Start Date End Date Taking? Authorizing Provider  amLODipine (NORVASC) 10 MG tablet Take 1 tablet (10 mg total) by mouth daily. 12/01/19 01/30/20  Iloabachie, Chioma E, NP  aspirin 325 MG tablet Take 1 tablet (325 mg total) by mouth daily. 11/10/19   Iloabachie, Chioma E, NP  blood glucose meter kit and supplies KIT Dispense based on patient and insurance preference. Use up to four times daily as directed. (FOR ICD-9 250.00, 250.01). 10/27/19   Iloabachie, Chioma E, NP  gabapentin (NEURONTIN) 100 MG capsule Take 1 capsule (100 mg total) by mouth at bedtime. 12/01/19   Iloabachie, Chioma E, NP  glipiZIDE (GLUCOTROL) 5 MG tablet Take 0.5 tablets (2.5 mg total) by mouth 2 (two) times daily before a meal. 12/01/19   Iloabachie, Chioma E, NP  metFORMIN (GLUCOPHAGE) 1000 MG tablet Take 1 tablet (1,000 mg total) by mouth 2 (two) times daily with a meal. 12/01/19   Iloabachie, Chioma E, NP  nicotine (NICODERM CQ - DOSED IN MG/24 HOURS) 14 mg/24hr patch Place 1 patch (14 mg total) onto the skin daily. Patient not taking: Reported on 12/01/2019 09/26/18   Bettey Costa, MD  triamcinolone cream (KENALOG) 0.1 % Apply 1 application topically 2 (two) times daily. 12/01/19   Iloabachie, Chioma E, NP     Family History  Problem Relation Age of Onset  . Colon cancer Father   . Diabetes Father   . Diabetes Mother     Social History   Socioeconomic History  . Marital status: Widowed    Spouse name: Not on file  . Number of children: Not on file  .  Years of education: Not on file  . Highest education level: Not on file  Occupational History  . Occupation: unemployed  Tobacco Use  . Smoking status: Current Every Day Smoker    Packs/day: 1.00    Years: 38.00    Pack years: 38.00    Types: Cigarettes  . Smokeless tobacco: Never Used  Vaping Use  . Vaping Use: Never used  Substance and Sexual Activity  . Alcohol use: Yes    Comment: 2 24 oz beers per day  . Drug use: Yes    Types: Cocaine, Marijuana    Comment: reports marijuana use once per month and cocaine use 2-3 times per month  . Sexual activity: Not on file  Other Topics Concern  . Not on file  Social History Narrative  . Not on file   Social Determinants of Health   Financial Resource Strain:   . Difficulty of Paying Living Expenses: Not on file  Food Insecurity:   . Worried About Charity fundraiser in the Last Year: Not on file  . Ran Out of Food in the Last Year: Not on file  Transportation Needs: No Transportation Needs  . Lack of Transportation (Medical): No  . Lack of Transportation (Non-Medical): No  Physical Activity:   . Days of Exercise per Week: Not on file  . Minutes of Exercise per Session: Not on file  Stress: Stress Concern Present  . Feeling of Stress : To some extent  Social Connections: Socially Isolated  . Frequency of Communication with Friends and Family: Never  . Frequency of Social Gatherings with Friends and Family: More than three times a week  . Attends Religious Services: Never  . Active Member of Clubs or Organizations: No  . Attends Archivist Meetings: Never  . Marital Status: Divorced    ECOG Status: 1 - Symptomatic but completely ambulatory  Review of Systems  Review of Systems: A 12 point ROS discussed and pertinent positives are indicated in the HPI above.  All other systems are negative.  Physical Exam No direct physical exam was performed (except for noted visual exam findings with Video Visits).    Vital Signs: There were no vitals taken for this visit.  Imaging: CT Chest W Contrast  Result Date: 01/18/2020 CLINICAL DATA:  Evaluate for pulmonary metastatic disease, hepatocellular carcinoma recently diagnosed by imaging EXAM: CT CHEST WITH CONTRAST TECHNIQUE: Multidetector CT imaging of the chest was performed during intravenous contrast administration. CONTRAST:  11m OMNIPAQUE IOHEXOL 300 MG/ML  SOLN COMPARISON:  MR abdomen, 12/30/2019 FINDINGS: Cardiovascular: Aortic atherosclerosis. Normal heart size. Extensive 3 vessel coronary artery calcifications and/or stents. No pericardial effusion. Mediastinum/Nodes: No enlarged mediastinal, hilar, or axillary lymph nodes. Thyroid gland, trachea, and esophagus demonstrate no significant findings. Lungs/Pleura: Lungs are clear. No pleural effusion or pneumothorax. Upper Abdomen: No acute abnormality. Cirrhotic morphology of the liver. Liver lesion of hepatic segment VI is only very faintly appreciated on this single phase examination (series 2, image 158). Cholelithiasis with multiple small gallstones near  the gallbladder neck. Musculoskeletal: No chest wall mass or suspicious bone lesions identified. IMPRESSION: 1. No evidence of metastatic disease in the chest. 2. Cirrhotic morphology of the liver. Liver lesion of hepatic segment VI is only very faintly appreciated on this single contrast phase examination and best characterized by recent MR. 3. Cholelithiasis. 4. Coronary artery disease.  Aortic Atherosclerosis (ICD10-I70.0). Electronically Signed   By: Eddie Candle M.D.   On: 01/18/2020 16:03   IR Radiologist Eval & Mgmt  Result Date: 01/19/2020 Please refer to notes tab for details about interventional procedure. (Op Note)   Labs:  CBC: Recent Labs    10/10/19 0806 01/13/20 1433  WBC 6.7 5.8  HGB 15.8 14.7  HCT 44.9 42.5  PLT 104* 105*    COAGS: Recent Labs    10/10/19 0806 01/13/20 1433  INR 1.1 1.1  APTT 32  --      BMP: Recent Labs    10/10/19 0806 01/13/20 1433  NA 131* 137  K 4.1 4.7  CL 101 104  CO2 23 25  GLUCOSE 302* 117*  BUN 12 15  CALCIUM 8.8* 9.3  CREATININE 0.78 0.84  GFRNONAA >60 >60  GFRAA >60  --     LIVER FUNCTION TESTS: Recent Labs    10/10/19 0806 01/13/20 1433  BILITOT 1.9* 1.6*  AST 128* 70*  ALT 86* 51*  ALKPHOS 179* 110  PROT 7.8 7.5  ALBUMIN 3.1* 3.5    TUMOR MARKERS: No results for input(s): AFPTM, CEA, CA199, CHROMGRNA in the last 8760 hours.  Assessment and Plan:  ADITH TEJADA is a 54 y.o. male with past medical history significant for diabetes, hypertension, stroke and hepatitis C found to have an indeterminate liver lesion on right upper quadrant abdominal ultrasound performed 10/10/2019 for evaluation of elevated LFTs.  Lesion was further evaluated on dedicated abdominal MRI performed 12/30/2019 which demonstrated an approximately 3.8 x 3.2 cm lesion within the right lobe of the liver which demonstrated imaging characteristics compatible with a hepatocellular carcinoma (note, the lesion was found estimated to measure approximately 4.3 x 3.8 cm on the right upper quadrant abdominal ultrasound.)  Subsequent staging chest CT performed 01/18/2020 was negative for evidence of pulmonary metastatic disease.  Since initial consultation on 10/21, the patient has been evaluated by Dr. Barry Dienes and deemed NOT an operative candidate due to his continued alcohol consumption.  The patient wishes to pursue intervention/treatment, though wishes to undergo the least invasive option available and is concerned about financial constraints.   Unfortunately, given the size of the lesion (greater than 3 cm), the patient is a suboptimal candidate for attempted image guided liver lesion ablation.  As the patient only has one known liver lesion and wishes to proceed to the least invasive intervention, I feel the patient may be a candidate for transcatheter bland embolization.   Bland embolization may be performed at a single session as opposed to Y 90 radioembolization which would require at least 2 procedures.  As such, I explained that bland embolization is not performed with curative intent though in hopes of lesion size reduction.  I explained that if the lesion were to shrink to less than 3 cm, potentially at that time he would be a candidate for image guided ablation.  I explained bland hepatic embolization entails an overnight admission for continued observation and PCA usage.  Benefits and risks of the procedure, including but not limited to, bleeding, vessel injury, contrast and radiation exposure, worsening liver function and/or nontarget embolization,  was discussed in detail with the patient's brother.  I explained that we would first proceed with obtaining a cirrhotic protocol CT of the abdomen and pelvis for procedural planning purposes.  Following this prolonged and detailed conversation, they wish to pursue this treatment option however are concerned about the financial constraints.  The importance of alcohol cessation was again stressed to the patient's brother.  Plan: - Secure financial support for subsequent imaging and procedures/hospitalizations - Obtain a procedure planning cirrhotic protocol CT of the abdomen and pelvis. - Once the CT is performed and reviewed, the bland embolization will be scheduled at [facility].  The procedure will entail an overnight admission for continued observation and PCA usage  The patient and the patient's brother know to call the interventional radiology clinic with any interval questions or concerns.  A copy of this report was sent to the requesting provider on this date.  Electronically Signed: Sandi Mariscal 01/31/2020, 8:24 AM   I spent a total of 15 Minutes in remote clinical consultation, greater than 50% of which was counseling/coordinating care for percutaneous management of hepatocellular  carcinoma.    Visit type: Audio only (telephone). Audio (no video) only due to patient's lack of internet/smartphone capability. Alternative for in-person consultation at [facility], [address], [place], [place]. This visit type was conducted due to national recommendations for restrictions regarding the COVID-19 Pandemic (e.g. social distancing).  This format is felt to be most appropriate for this patient at this time.  All issues noted in this document were discussed and addressed.

## 2020-01-31 NOTE — Patient Instructions (Signed)
STOP TAKING AMLODIPINE FOR BLOOD PRESSURE START TAKING GLIPIZIDE 2.5 MG DAILY START TAKING LOSARTAN 25 MG DAILY FOR BLOOD PRESSURE      DASH Eating Plan DASH stands for "Dietary Approaches to Stop Hypertension." The DASH eating plan is a healthy eating plan that has been shown to reduce high blood pressure (hypertension). It may also reduce your risk for type 2 diabetes, heart disease, and stroke. The DASH eating plan may also help with weight loss. What are tips for following this plan?  General guidelines  Avoid eating more than 2,300 mg (milligrams) of salt (sodium) a day. If you have hypertension, you may need to reduce your sodium intake to 1,500 mg a day.  Limit alcohol intake to no more than 1 drink a day for nonpregnant women and 2 drinks a day for men. One drink equals 12 oz of beer, 5 oz of wine, or 1 oz of hard liquor.  Work with your health care provider to maintain a healthy body weight or to lose weight. Ask what an ideal weight is for you.  Get at least 30 minutes of exercise that causes your heart to beat faster (aerobic exercise) most days of the week. Activities may include walking, swimming, or biking.  Work with your health care provider or diet and nutrition specialist (dietitian) to adjust your eating plan to your individual calorie needs. Reading food labels   Check food labels for the amount of sodium per serving. Choose foods with less than 5 percent of the Daily Value of sodium. Generally, foods with less than 300 mg of sodium per serving fit into this eating plan.  To find whole grains, look for the word "whole" as the first word in the ingredient list. Shopping  Buy products labeled as "low-sodium" or "no salt added."  Buy fresh foods. Avoid canned foods and premade or frozen meals. Cooking  Avoid adding salt when cooking. Use salt-free seasonings or herbs instead of table salt or sea salt. Check with your health care provider or pharmacist before  using salt substitutes.  Do not fry foods. Cook foods using healthy methods such as baking, boiling, grilling, and broiling instead.  Cook with heart-healthy oils, such as olive, canola, soybean, or sunflower oil. Meal planning  Eat a balanced diet that includes: ? 5 or more servings of fruits and vegetables each day. At each meal, try to fill half of your plate with fruits and vegetables. ? Up to 6-8 servings of whole grains each day. ? Less than 6 oz of lean meat, poultry, or fish each day. A 3-oz serving of meat is about the same size as a deck of cards. One egg equals 1 oz. ? 2 servings of low-fat dairy each day. ? A serving of nuts, seeds, or beans 5 times each week. ? Heart-healthy fats. Healthy fats called Omega-3 fatty acids are found in foods such as flaxseeds and coldwater fish, like sardines, salmon, and mackerel.  Limit how much you eat of the following: ? Canned or prepackaged foods. ? Food that is high in trans fat, such as fried foods. ? Food that is high in saturated fat, such as fatty meat. ? Sweets, desserts, sugary drinks, and other foods with added sugar. ? Full-fat dairy products.  Do not salt foods before eating.  Try to eat at least 2 vegetarian meals each week.  Eat more home-cooked food and less restaurant, buffet, and fast food.  When eating at a restaurant, ask that your food be prepared  with less salt or no salt, if possible. What foods are recommended? The items listed may not be a complete list. Talk with your dietitian about what dietary choices are best for you. Grains Whole-grain or whole-wheat bread. Whole-grain or whole-wheat pasta. Brown rice. [place]. Bulgur. Whole-grain and low-sodium cereals. Pita bread. Low-fat, low-sodium crackers. Whole-wheat flour tortillas. Vegetables Fresh or frozen vegetables (raw, steamed, roasted, or grilled). Low-sodium or reduced-sodium tomato and vegetable juice. Low-sodium or reduced-sodium tomato sauce and  tomato paste. Low-sodium or reduced-sodium canned vegetables. Fruits All fresh, dried, or frozen fruit. Canned fruit in natural juice (without added sugar). Meat and other protein foods Skinless chicken or [place]. Ground chicken or [place]. Pork with fat trimmed off. Fish and seafood. Egg whites. Dried beans, peas, or lentils. Unsalted nuts, nut butters, and seeds. Unsalted canned beans. Lean cuts of beef with fat trimmed off. Low-sodium, lean deli meat. Dairy Low-fat (1%) or fat-free (skim) milk. Fat-free, low-fat, or reduced-fat cheeses. Nonfat, low-sodium ricotta or cottage cheese. Low-fat or nonfat yogurt. Low-fat, low-sodium cheese. Fats and oils Soft margarine without trans fats. Vegetable oil. Low-fat, reduced-fat, or light mayonnaise and salad dressings (reduced-sodium). Canola, safflower, olive, soybean, and sunflower oils. Avocado. Seasoning and other foods Herbs. Spices. Seasoning mixes without salt. Unsalted popcorn and pretzels. Fat-free sweets. What foods are not recommended? The items listed may not be a complete list. Talk with your dietitian about what dietary choices are best for you. Grains Baked goods made with fat, such as croissants, muffins, or some breads. Dry pasta or rice meal packs. Vegetables Creamed or fried vegetables. Vegetables in a cheese sauce. Regular canned vegetables (not low-sodium or reduced-sodium). Regular canned tomato sauce and paste (not low-sodium or reduced-sodium). Regular tomato and vegetable juice (not low-sodium or reduced-sodium). Angie Fava. Olives. Fruits Canned fruit in a light or heavy syrup. Fried fruit. Fruit in cream or butter sauce. Meat and other protein foods Fatty cuts of meat. Ribs. Fried meat. Berniece Salines. Sausage. Bologna and other processed lunch meats. Salami. Fatback. Hotdogs. Bratwurst. Salted nuts and seeds. Canned beans with added salt. Canned or smoked fish. Whole eggs or egg yolks. Chicken or [place] with skin. Dairy Whole or 2%  milk, cream, and half-and-half. Whole or full-fat cream cheese. Whole-fat or sweetened yogurt. Full-fat cheese. Nondairy creamers. Whipped toppings. Processed cheese and cheese spreads. Fats and oils Butter. Stick margarine. Lard. Shortening. Ghee. Bacon fat. Tropical oils, such as coconut, palm kernel, or palm oil. Seasoning and other foods Salted popcorn and pretzels. Onion salt, garlic salt, seasoned salt, table salt, and sea salt. Worcestershire sauce. Tartar sauce. Barbecue sauce. Teriyaki sauce. Soy sauce, including reduced-sodium. Steak sauce. Canned and packaged gravies. Fish sauce. Oyster sauce. Cocktail sauce. Horseradish that you find on the shelf. Ketchup. Mustard. Meat flavorings and tenderizers. Bouillon cubes. Hot sauce and Tabasco sauce. Premade or packaged marinades. Premade or packaged taco seasonings. Relishes. Regular salad dressings. Where to find more information:  National Heart, Lung, and [place]: https://wilson-eaton.com/  American Heart Association: www.heart.org Summary  The DASH eating plan is a healthy eating plan that has been shown to reduce high blood pressure (hypertension). It may also reduce your risk for type 2 diabetes, heart disease, and stroke.  With the DASH eating plan, you should limit salt (sodium) intake to 2,300 mg a day. If you have hypertension, you may need to reduce your sodium intake to 1,500 mg a day.  When on the DASH eating plan, aim to eat more fresh fruits and vegetables, whole grains, lean  proteins, low-fat dairy, and heart-healthy fats.  Work with your health care provider or diet and nutrition specialist (dietitian) to adjust your eating plan to your individual calorie needs. This information is not intended to replace advice given to you by your health care provider. Make sure you discuss any questions you have with your health care provider. Document Revised: 02/27/2017 Document Reviewed: 03/10/2016 Elsevier Patient Education  2020  [employer].

## 2020-01-31 NOTE — Progress Notes (Signed)
Established Patient Office Visit  Subjective:  Patient ID: Johnny Kelley, male    DOB: 05-03-1965  Age: 54 y.o. MRN: [id number]  CC: No chief complaint on file.   HPI Johnny Kelley presents for follow up of Type 2 diabetes mellitus, hypertension, lab review and medication refill. He states that he's compliant with his medications and continues to make healthy lifestyle choices. His HgbA1c done on 01/25/2020 decreased from 8.8% to 5.7%. He checks his blood glucose bid and his fasting readings are less than 100 mg/dl and evening readings are in the low 100's. He denies hyperglycemic/hypoglycemic symptoms. He reports that taking gabapentin relieves his neuropathic symptoms and he performs daily foot checks. He was seen by the Podiatrist Dr Posey Pronto K.P on 01/17/2020 and toe nail debridement was done. He denies ingrown toe nail, erythema and pain. His blood pressure was elevated during visit and he reports that he's complaint with his medications and doesn't check his blood pressure at home. He reports bilateral ankle edema that has been going on for 2 months. He states that the non pruritic erythematous scattered rash to his arms has improved 50% with using Triamcinolone cream. He also states that he has a lump to the left temporal aspect of  his head that has been going on for more than 5 years. He states that it's tender when touched, denies erythema. He was also seen by Hematology/Oncologist Dr Janese Banks A for liver mass, he denies right upper quadrant abdominal pain and jaundice. Overall, he states that he's doing well and offers no further complaint.  Past Medical History:  Diagnosis Date   Cancer, hepatocellular ([place])    Diabetes mellitus without complication ([place])    Hepatitis C    Hypertension    Psoriasis    Stroke ([place])    Hx stroke in 2020    Past Surgical History:  Procedure Laterality Date   IR RADIOLOGIST EVAL & MGMT  01/19/2020   IR RADIOLOGIST EVAL & MGMT  01/31/2020    KNEE SURGERY Right     Family History  Problem Relation Age of Onset   Colon cancer Father    Diabetes Father    Diabetes Mother     Social History   Socioeconomic History   Marital status: Widowed    Spouse name: Not on file   Number of children: Not on file   Years of education: Not on file   Highest education level: Not on file  Occupational History   Occupation: unemployed  Tobacco Use   Smoking status: Current Every Day Smoker    Packs/day: 1.00    Years: 38.00    Pack years: 38.00    Types: Cigarettes   Smokeless tobacco: Never Used  Scientific laboratory technician Use: Never used  Substance and Sexual Activity   Alcohol use: Yes    Comment: 2 24 oz beers per day   Drug use: Yes    Types: Cocaine, Marijuana    Comment: reports marijuana use once per month and cocaine use 2-3 times per month   Sexual activity: Not on file  Other Topics Concern   Not on file  Social History Narrative   Not on file   Social Determinants of Health   Financial Resource Strain:    Difficulty of Paying Living Expenses: Not on file  Food Insecurity:    Worried About [place] in the Last Year: Not on file   [employer] of Food in the Last  Year: Not on file  Transportation Needs: No Transportation Needs   Lack of Transportation (Medical): No   Lack of Transportation (Non-Medical): No  Physical Activity:    Days of Exercise per Week: Not on file   Minutes of Exercise per Session: Not on file  Stress: Stress Concern Present   Feeling of Stress : To some extent  Social Connections: Socially Isolated   Frequency of Communication with Friends and Family: Never   Frequency of Social Gatherings with Friends and Family: More than three times a week   Attends Religious Services: Never   Marine scientist or Organizations: No   Attends Music therapist: Never   Marital Status: Divorced  Human resources officer Violence:    Fear of Current or  Ex-Partner: Not on file   Emotionally Abused: Not on file   Physically Abused: Not on file   Sexually Abused: Not on file    Outpatient Medications Prior to Visit  Medication Sig Dispense Refill   aspirin 325 MG tablet Take 1 tablet (325 mg total) by mouth daily. 120 tablet 0   triamcinolone cream (KENALOG) 0.1 % Apply 1 application topically 2 (two) times daily. 30 g 2   gabapentin (NEURONTIN) 100 MG capsule Take 1 capsule (100 mg total) by mouth at bedtime. 30 capsule 2   glipiZIDE (GLUCOTROL) 5 MG tablet Take 0.5 tablets (2.5 mg total) by mouth 2 (two) times daily before a meal. 30 tablet 2   metFORMIN (GLUCOPHAGE) 1000 MG tablet Take 1 tablet (1,000 mg total) by mouth 2 (two) times daily with a meal. 60 tablet 2   blood glucose meter kit and supplies KIT Dispense based on patient and insurance preference. Use up to four times daily as directed. (FOR ICD-9 250.00, 250.01). 1 each 0   nicotine (NICODERM CQ - DOSED IN MG/24 HOURS) 14 mg/24hr patch Place 1 patch (14 mg total) onto the skin daily. (Patient not taking: Reported on 12/01/2019) 28 patch 0   amLODipine (NORVASC) 10 MG tablet Take 1 tablet (10 mg total) by mouth daily. 30 tablet 2   No facility-administered medications prior to visit.    Allergies  Allergen Reactions   Amlodipine Swelling    ROS Review of Systems  Constitutional: Negative.   Eyes: Negative.   Respiratory: Negative.   Cardiovascular: Positive for leg swelling (bilateral ankle swelling).  Endocrine: Negative.   Skin: Positive for rash (scattered erythematous rash chronic).  Neurological: Negative.       Objective:    Physical Exam HENT:     Head: Normocephalic and atraumatic.  Cardiovascular:     Rate and Rhythm: Normal rate and regular rhythm.     Pulses: Normal pulses.     Heart sounds: Normal heart sounds.  Pulmonary:     Effort: Pulmonary effort is normal.     Breath sounds: Normal breath sounds.  Abdominal:     Palpations:  Abdomen is soft.     Tenderness: There is no abdominal tenderness.  Musculoskeletal:        General: Swelling (+1 edema to bilateral ankle) present.  Skin:    General: Skin is warm and dry.     Coloration: Skin is not jaundiced.  Neurological:     General: No focal deficit present.     Mental Status: He is alert and oriented to person, place, and time. Mental status is at baseline.  Psychiatric:        Mood and Affect: Mood normal.  Behavior: Behavior normal.        Thought Content: Thought content normal.        Judgment: Judgment normal.     BP (!) 158/78 (BP Location: Left Arm, Patient Position: Sitting, Cuff Size: Large)    Pulse 81    Temp 98 F (36.7 C)    Resp 16    Wt 225 lb 14.4 oz (102.5 kg)    SpO2 98%    BMI 29.80 kg/m  Wt Readings from Last 3 Encounters:  01/31/20 225 lb 14.4 oz (102.5 kg)  01/20/20 219 lb 8 oz (99.6 kg)  01/13/20 221 lb 1.6 oz (100.3 kg)     Health Maintenance Due  Topic Date Due   OPHTHALMOLOGY EXAM  Never done   COVID-19 Vaccine (1) Never done   TETANUS/TDAP  Never done   COLONOSCOPY  Never done    There are no preventive care reminders to display for this patient.  No results found for: TSH Lab Results  Component Value Date   WBC 5.8 01/13/2020   HGB 14.7 01/13/2020   HCT 42.5 01/13/2020   MCV 95.1 01/13/2020   PLT 105 (L) 01/13/2020   Lab Results  Component Value Date   NA 137 01/13/2020   K 4.7 01/13/2020   CO2 25 01/13/2020   GLUCOSE 117 (H) 01/13/2020   BUN 15 01/13/2020   CREATININE 0.84 01/13/2020   BILITOT 1.6 (H) 01/13/2020   ALKPHOS 110 01/13/2020   AST 70 (H) 01/13/2020   ALT 51 (H) 01/13/2020   PROT 7.5 01/13/2020   ALBUMIN 3.5 01/13/2020   CALCIUM 9.3 01/13/2020   ANIONGAP 8 01/13/2020   Lab Results  Component Value Date   CHOL 131 11/02/2019   Lab Results  Component Value Date   HDL 56 11/02/2019   Lab Results  Component Value Date   LDLCALC 59 11/02/2019   Lab Results  Component  Value Date   TRIG 85 11/02/2019   Lab Results  Component Value Date   CHOLHDL 2.3 11/02/2019   Lab Results  Component Value Date   HGBA1C 5.7 (H) 01/25/2020      Assessment & Plan:   1. Essential hypertension - His blood pressure is not under control, his goal should be less than 140/90.. Due to edema to bilateral ankle, his Amlodipine was discontinued and he was started on Losartan 25 mg daily. He was educated on medication side effects and advised to notify clinic. He is to continue on DASH diet. - losartan (COZAAR) 25 MG tablet; Take 1 tablet (25 mg total) by mouth daily.  Dispense: 30 tablet; Refill: 0  2. Type 2 diabetes mellitus with diabetic neuropathy, without long-term current use of insulin ([facility]) -His HgbA1c has greatly improved to 5.7%. His Glipizide was decreased to 2.5 mg daily and he will continue on Metformin 1000 mg bid. He was advised to continue on low carb/non concentrated sweet diet. - gabapentin (NEURONTIN) 100 MG capsule; Take 1 capsule (100 mg total) by mouth at bedtime.  Dispense: 30 capsule; Refill: 2 - glipiZIDE (GLUCOTROL) 5 MG tablet; Take 0.5 tablets (2.5 mg total) by mouth daily before breakfast.  Dispense: 30 tablet; Refill: 2 - metFORMIN (GLUCOPHAGE) 1000 MG tablet; Take 1 tablet (1,000 mg total) by mouth 2 (two) times daily with a meal.  Dispense: 60 tablet; Refill: 2  3. Hepatocellular carcinoma ([place]) - He will continue to follow up with Oncologist.  4. Lump of skin - He will follow up with Dermatology for  lump to ;ateral aspect of head that is bothersome. - Ambulatory referral to Dermatology  5. Psoriasis - He will continue to use Triamcinolone cream and follow up with Dermatology. - Ambulatory referral to Dermatology     Follow-up: Return in about 4 weeks (around 02/28/2020), or if symptoms worsen or fail to improve.    My [place] Jerold Coombe, NP

## 2020-02-22 ENCOUNTER — Other Ambulatory Visit: Payer: Self-pay

## 2020-02-22 ENCOUNTER — Other Ambulatory Visit (HOSPITAL_COMMUNITY): Payer: Self-pay | Admitting: Interventional Radiology

## 2020-02-22 ENCOUNTER — Ambulatory Visit
Admission: RE | Admit: 2020-02-22 | Discharge: 2020-02-22 | Disposition: A | Discharge status: Home / Self Care | Payer: Medicaid Other | Source: Ambulatory Visit | Attending: Interventional Radiology | Admitting: Interventional Radiology

## 2020-02-22 DIAGNOSIS — K769 Liver disease, unspecified: Secondary | ICD-10-CM | POA: Insufficient documentation

## 2020-02-22 DIAGNOSIS — K8020 Calculus of gallbladder without cholecystitis without obstruction: Secondary | ICD-10-CM | POA: Diagnosis not present

## 2020-02-22 DIAGNOSIS — B1920 Unspecified viral hepatitis C without hepatic coma: Secondary | ICD-10-CM | POA: Diagnosis not present

## 2020-02-22 DIAGNOSIS — K7689 Other specified diseases of liver: Secondary | ICD-10-CM | POA: Diagnosis not present

## 2020-02-22 DIAGNOSIS — K7460 Unspecified cirrhosis of liver: Secondary | ICD-10-CM | POA: Diagnosis not present

## 2020-02-22 LAB — POCT I-STAT CREATININE: Creatinine, Ser: 0.9 mg/dL (ref 0.61–1.24)

## 2020-02-22 MED ORDER — IOHEXOL 300 MG/ML  SOLN
100.0000 mL | Freq: Once | INTRAMUSCULAR | Status: AC | PRN
  Administered 2020-02-22: 100 mL via INTRAVENOUS

## 2020-02-27 ENCOUNTER — Telehealth: Payer: Self-pay

## 2020-02-27 NOTE — Telephone Encounter (Signed)
Scheduled for Dr.Watts/BlandEmbo 03/26/20. Will arrange follow up with Dr. Janese Banks following procedure.

## 2020-02-28 ENCOUNTER — Other Ambulatory Visit: Payer: Self-pay

## 2020-02-28 ENCOUNTER — Ambulatory Visit: Payer: Medicaid Other | Admitting: Gerontology

## 2020-02-28 ENCOUNTER — Encounter: Payer: Self-pay | Admitting: Gerontology

## 2020-02-28 VITALS — BP 168/90 | HR 74 | Temp 98.1°F | Resp 16 | Wt 225.8 lb

## 2020-02-28 DIAGNOSIS — E1140 Type 2 diabetes mellitus with diabetic neuropathy, unspecified: Secondary | ICD-10-CM

## 2020-02-28 DIAGNOSIS — I10 Essential (primary) hypertension: Secondary | ICD-10-CM

## 2020-02-28 MED ORDER — METFORMIN HCL 1000 MG PO TABS: 1000.0000 mg | ORAL_TABLET | Freq: Two times a day (BID) | ORAL | 2 refills | Status: DC

## 2020-02-28 MED ORDER — LOSARTAN POTASSIUM 50 MG PO TABS: 50.0000 mg | ORAL_TABLET | Freq: Every day | ORAL | 0 refills | Status: DC

## 2020-02-28 MED ORDER — GABAPENTIN 100 MG PO CAPS: 100.0000 mg | ORAL_CAPSULE | Freq: Every day | ORAL | 0 refills | Status: DC

## 2020-02-28 MED ORDER — ASPIRIN 325 MG PO TABS: 325.0000 mg | ORAL_TABLET | Freq: Every day | ORAL | 0 refills | Status: DC

## 2020-02-28 NOTE — Patient Instructions (Signed)
Carbohydrate Counting for Diabetes Mellitus, Adult  Carbohydrate counting is a method of keeping track of how many carbohydrates you eat. Eating carbohydrates naturally increases the amount of sugar (glucose) in the blood. Counting how many carbohydrates you eat helps keep your blood glucose within normal limits, which helps you manage your diabetes (diabetes mellitus). It is important to know how many carbohydrates you can safely have in each meal. This is different for every person. A diet and nutrition specialist (registered dietitian) can help you make a meal plan and calculate how many carbohydrates you should have at each meal and snack. Carbohydrates are found in the following foods:  Grains, such as breads and cereals.  Dried beans and soy products.  Starchy vegetables, such as potatoes, peas, and corn.  Fruit and fruit juices.  Milk and yogurt.  Sweets and snack foods, such as cake, cookies, candy, chips, and soft drinks. How do I count carbohydrates? There are two ways to count carbohydrates in food. You can use either of the methods or a combination of both. Reading "Nutrition Facts" on packaged food The "Nutrition Facts" list is included on the labels of almost all packaged foods and beverages in the [place] It includes:  The serving size.  Information about nutrients in each serving, including the grams (g) of carbohydrate per serving. To use the "Nutrition Facts":  Decide how many servings you will have.  Multiply the number of servings by the number of carbohydrates per serving.  The resulting number is the total amount of carbohydrates that you will be having. Learning standard serving sizes of other foods When you eat carbohydrate foods that are not packaged or do not include "Nutrition Facts" on the label, you need to measure the servings in order to count the amount of carbohydrates:  Measure the foods that you will eat with a food scale or measuring cup, if needed.   Decide how many standard-size servings you will eat.  Multiply the number of servings by 15. Most carbohydrate-rich foods have about 15 g of carbohydrates per serving. ? For example, if you eat 8 oz (170 g) of strawberries, you will have eaten 2 servings and 30 g of carbohydrates (2 servings x 15 g = 30 g).  For foods that have more than one food mixed, such as soups and casseroles, you must count the carbohydrates in each food that is included. The following list contains standard serving sizes of common carbohydrate-rich foods. Each of these servings has about 15 g of carbohydrates:   hamburger bun or  English muffin.   oz (15 mL) syrup.   oz (14 g) jelly.  1 slice of bread.  1 six-inch tortilla.  3 oz (85 g) cooked rice or pasta.  4 oz (113 g) cooked dried beans.  4 oz (113 g) starchy vegetable, such as peas, corn, or potatoes.  4 oz (113 g) hot cereal.  4 oz (113 g) mashed potatoes or  of a large baked potato.  4 oz (113 g) canned or frozen fruit.  4 oz (120 mL) fruit juice.  4-6 crackers.  6 chicken nuggets.  6 oz (170 g) unsweetened dry cereal.  6 oz (170 g) plain fat-free yogurt or yogurt sweetened with artificial sweeteners.  8 oz (240 mL) milk.  8 oz (170 g) fresh fruit or one small piece of fruit.  24 oz (680 g) popped popcorn. Example of carbohydrate counting Sample meal  3 oz (85 g) chicken breast.  6 oz (170 g)   brown rice.  4 oz (113 g) corn.  8 oz (240 mL) milk.  8 oz (170 g) strawberries with sugar-free whipped topping. Carbohydrate calculation 1. Identify the foods that contain carbohydrates: ? Rice. ? Corn. ? Milk. ? Strawberries. 2. Calculate how many servings you have of each food: ? 2 servings rice. ? 1 serving corn. ? 1 serving milk. ? 1 serving strawberries. 3. Multiply each number of servings by 15 g: ? 2 servings rice x 15 g = 30 g. ? 1 serving corn x 15 g = 15 g. ? 1 serving milk x 15 g = 15 g. ? 1 serving  strawberries x 15 g = 15 g. 4. Add together all of the amounts to find the total grams of carbohydrates eaten: ? 30 g + 15 g + 15 g + 15 g = 75 g of carbohydrates total. Summary  Carbohydrate counting is a method of keeping track of how many carbohydrates you eat.  Eating carbohydrates naturally increases the amount of sugar (glucose) in the blood.  Counting how many carbohydrates you eat helps keep your blood glucose within normal limits, which helps you manage your diabetes.  A diet and nutrition specialist (registered dietitian) can help you make a meal plan and calculate how many carbohydrates you should have at each meal and snack. This information is not intended to replace advice given to you by your health care provider. Make sure you discuss any questions you have with your health care provider. Document Revised: 10/09/2016 Document Reviewed: 08/29/2015 Elsevier Patient Education  2020 [employer] Inc[employer] DASH Eating Plan DASH stands for "Dietary Approaches to Stop Hypertension." The DASH eating plan is a healthy eating plan that has been shown to reduce high blood pressure (hypertension). It may also reduce your risk for type 2 diabetes, heart disease, and stroke. The DASH eating plan may also help with weight loss. What are tips for following this plan?  General guidelines  Avoid eating more than 2,300 mg (milligrams) of salt (sodium) a day. If you have hypertension, you may need to reduce your sodium intake to 1,500 mg a day.  Limit alcohol intake to no more than 1 drink a day for nonpregnant women and 2 drinks a day for men. One drink equals 12 oz of beer, 5 oz of wine, or 1 oz of hard liquor.  Work with your health care provider to maintain a healthy body weight or to lose weight. Ask what an ideal weight is for you.  Get at least 30 minutes of exercise that causes your heart to beat faster (aerobic exercise) most days of the week. Activities may include walking, swimming, or  biking.  Work with your health care provider or diet and nutrition specialist (dietitian) to adjust your eating plan to your individual calorie needs. Reading food labels   Check food labels for the amount of sodium per serving. Choose foods with less than 5 percent of the Daily Value of sodium. Generally, foods with less than 300 mg of sodium per serving fit into this eating plan.  To find whole grains, look for the word "whole" as the first word in the ingredient list. Shopping  Buy products labeled as "low-sodium" or "no salt added."  Buy fresh foods. Avoid canned foods and premade or frozen meals. Cooking  Avoid adding salt when cooking. Use salt-free seasonings or herbs instead of table salt or sea salt. Check with your health care provider or pharmacist before using salt substitutes.  Do not   fry foods. Cook foods using healthy methods such as baking, boiling, grilling, and broiling instead.  Cook with heart-healthy oils, such as olive, canola, soybean, or sunflower oil. Meal planning  Eat a balanced diet that includes: ? 5 or more servings of fruits and vegetables each day. At each meal, try to fill half of your plate with fruits and vegetables. ? Up to 6-8 servings of whole grains each day. ? Less than 6 oz of lean meat, poultry, or fish each day. A 3-oz serving of meat is about the same size as a deck of cards. One egg equals 1 oz. ? 2 servings of low-fat dairy each day. ? A serving of nuts, seeds, or beans 5 times each week. ? Heart-healthy fats. Healthy fats called Omega-3 fatty acids are found in foods such as flaxseeds and coldwater fish, like sardines, salmon, and mackerel.  Limit how much you eat of the following: ? Canned or prepackaged foods. ? Food that is high in trans fat, such as fried foods. ? Food that is high in saturated fat, such as fatty meat. ? Sweets, desserts, sugary drinks, and other foods with added sugar. ? Full-fat dairy products.  Do not salt  foods before eating.  Try to eat at least 2 vegetarian meals each week.  Eat more home-cooked food and less restaurant, buffet, and fast food.  When eating at a restaurant, ask that your food be prepared with less salt or no salt, if possible. What foods are recommended? The items listed may not be a complete list. Talk with your dietitian about what dietary choices are best for you. Grains Whole-grain or whole-wheat bread. Whole-grain or whole-wheat pasta. Brown rice. Oatmeal. Quinoa. Bulgur. Whole-grain and low-sodium cereals. Pita bread. Low-fat, low-sodium crackers. Whole-wheat flour tortillas. Vegetables Fresh or frozen vegetables (raw, steamed, roasted, or grilled). Low-sodium or reduced-sodium tomato and vegetable juice. Low-sodium or reduced-sodium tomato sauce and tomato paste. Low-sodium or reduced-sodium canned vegetables. Fruits All fresh, dried, or frozen fruit. Canned fruit in natural juice (without added sugar). Meat and other protein foods Skinless chicken or turkey. Ground chicken or turkey. Pork with fat trimmed off. Fish and seafood. Egg whites. Dried beans, peas, or lentils. Unsalted nuts, nut butters, and seeds. Unsalted canned beans. Lean cuts of beef with fat trimmed off. Low-sodium, lean deli meat. Dairy Low-fat (1%) or fat-free (skim) milk. Fat-free, low-fat, or reduced-fat cheeses. Nonfat, low-sodium ricotta or cottage cheese. Low-fat or nonfat yogurt. Low-fat, low-sodium cheese. Fats and oils Soft margarine without trans fats. Vegetable oil. Low-fat, reduced-fat, or light mayonnaise and salad dressings (reduced-sodium). Canola, safflower, olive, soybean, and sunflower oils. Avocado. Seasoning and other foods Herbs. Spices. Seasoning mixes without salt. Unsalted popcorn and pretzels. Fat-free sweets. What foods are not recommended? The items listed may not be a complete list. Talk with your dietitian about what dietary choices are best for you. Grains Baked goods  made with fat, such as croissants, muffins, or some breads. Dry pasta or rice meal packs. Vegetables Creamed or fried vegetables. Vegetables in a cheese sauce. Regular canned vegetables (not low-sodium or reduced-sodium). Regular canned tomato sauce and paste (not low-sodium or reduced-sodium). Regular tomato and vegetable juice (not low-sodium or reduced-sodium). Pickles. Olives. Fruits Canned fruit in a light or heavy syrup. Fried fruit. Fruit in cream or butter sauce. Meat and other protein foods Fatty cuts of meat. Ribs. Fried meat. Bacon. Sausage. Bologna and other processed lunch meats. Salami. Fatback. Hotdogs. Bratwurst. Salted nuts and seeds. Canned beans with added salt.   Canned or smoked fish. Whole eggs or egg yolks. Chicken or turkey with skin. Dairy Whole or 2% milk, cream, and half-and-half. Whole or full-fat cream cheese. Whole-fat or sweetened yogurt. Full-fat cheese. Nondairy creamers. Whipped toppings. Processed cheese and cheese spreads. Fats and oils Butter. Stick margarine. Lard. Shortening. Ghee. Bacon fat. Tropical oils, such as coconut, palm kernel, or palm oil. Seasoning and other foods Salted popcorn and pretzels. Onion salt, garlic salt, seasoned salt, table salt, and sea salt. Worcestershire sauce. Tartar sauce. Barbecue sauce. Teriyaki sauce. Soy sauce, including reduced-sodium. Steak sauce. Canned and packaged gravies. Fish sauce. Oyster sauce. Cocktail sauce. Horseradish that you find on the shelf. Ketchup. Mustard. Meat flavorings and tenderizers. Bouillon cubes. Hot sauce and Tabasco sauce. Premade or packaged marinades. Premade or packaged taco seasonings. Relishes. Regular salad dressings. Where to find more information:  National Heart, Lung, and Blood Institute: www.nhlbi.nih.gov  American Heart Association: www.heart.org Summary  The DASH eating plan is a healthy eating plan that has been shown to reduce high blood pressure (hypertension). It may also reduce  your risk for type 2 diabetes, heart disease, and stroke.  With the DASH eating plan, you should limit salt (sodium) intake to 2,300 mg a day. If you have hypertension, you may need to reduce your sodium intake to 1,500 mg a day.  When on the DASH eating plan, aim to eat more fresh fruits and vegetables, whole grains, lean proteins, low-fat dairy, and heart-healthy fats.  Work with your health care provider or diet and nutrition specialist (dietitian) to adjust your eating plan to your individual calorie needs. This information is not intended to replace advice given to you by your health care provider. Make sure you discuss any questions you have with your health care provider. Document Revised: 02/27/2017 Document Reviewed: 03/10/2016 Elsevier Patient Education  2020 Else[employer] Inc.  

## 2020-02-28 NOTE — Progress Notes (Signed)
Established Patient Office Visit  Subjective:  Patient ID: Johnny Kelley, male    DOB: January 28, 1966  Age: 54 y.o. MRN: [id number]  CC: No chief complaint on file.   HPI Johnny Kelley presents for follow up of Type 2 diabetes and hypertension. He states that he's compliant with his medication and continues to make healthy lifestyle changes. He checks his blood glucose bid, states that his fasting reading this morning was less than 120 mg/dl and it was 150 mg/dl during visit. His HgbA1c done on 01/25/2020 was 5.7%, denies hypo/hyperglycemic symptoms. He states that taking gabapentin 100 mg relieves his peripheral neuropathy and he performs daily foot checks. His Glipizide was decreased to 2.5 mg during his last visit.  His blood pressure was elevated during visit and he doesn't check his blood pressure at home, denies any side effects with taking 25 mg Losartan. He reports that he smokes less than 1/2 pack of cigarette daily and will follow up at [facility] on 03/08/20 for Ophthalmology exam. He denies chest pain, palpitation, headache, light headedness and vision changes. Overall, he states that he's doing well and offers no further complaint.  Past Medical History:  Diagnosis Date  . Cancer, hepatocellular ([facility])   . Diabetes mellitus without complication ([place])   . Hepatitis C   . Hypertension   . Psoriasis   . Stroke [facility])    Hx stroke in 2020    Past Surgical History:  Procedure Laterality Date  . IR RADIOLOGIST EVAL & MGMT  01/19/2020  . IR RADIOLOGIST EVAL & MGMT  01/31/2020  . KNEE SURGERY Right     Family History  Problem Relation Age of Onset  . Colon cancer Father   . Diabetes Father   . Diabetes Mother     Social History   Socioeconomic History  . Marital status: Widowed    Spouse name: Not on file  . Number of children: Not on file  . Years of education: Not on file  . Highest education level: Not on file  Occupational History  . Occupation: unemployed  Tobacco  Use  . Smoking status: Current Every Day Smoker    Packs/day: 1.00    Years: 38.00    Pack years: 38.00    Types: Cigarettes  . Smokeless tobacco: Never Used  Vaping Use  . Vaping Use: Never used  Substance and Sexual Activity  . Alcohol use: Yes    Comment: 2 24 oz beers per day  . Drug use: Yes    Types: Cocaine, Marijuana    Comment: reports marijuana use once per month and cocaine use 2-3 times per month  . Sexual activity: Not on file  Other Topics Concern  . Not on file  Social History Narrative  . Not on file   Social Determinants of Health   Financial Resource Strain:   . Difficulty of Paying Living Expenses: Not on file  Food Insecurity:   . Worried About Charity fundraiser in the Last Year: Not on file  . Ran Out of Food in the Last Year: Not on file  Transportation Needs: No Transportation Needs  . Lack of Transportation (Medical): No  . Lack of Transportation (Non-Medical): No  Physical Activity:   . Days of Exercise per Week: Not on file  . Minutes of Exercise per Session: Not on file  Stress: Stress Concern Present  . Feeling of Stress : To some extent  Social Connections: Socially Isolated  . Frequency of Communication  with Friends and Family: Never  . Frequency of Social Gatherings with Friends and Family: More than three times a week  . Attends Religious Services: Never  . Active Member of Clubs or Organizations: No  . Attends Archivist Meetings: Never  . Marital Status: Divorced  Human resources officer Violence:   . Fear of Current or Ex-Partner: Not on file  . Emotionally Abused: Not on file  . Physically Abused: Not on file  . Sexually Abused: Not on file    Outpatient Medications Prior to Visit  Medication Sig Dispense Refill  . triamcinolone cream (KENALOG) 0.1 % Apply 1 application topically 2 (two) times daily. 30 g 2  . aspirin 325 MG tablet Take 1 tablet (325 mg total) by mouth daily. 120 tablet 0  . gabapentin (NEURONTIN) 100 MG  capsule Take 1 capsule (100 mg total) by mouth at bedtime. 30 capsule 2  . glipiZIDE (GLUCOTROL) 5 MG tablet Take 0.5 tablets (2.5 mg total) by mouth daily before breakfast. 30 tablet 2  . losartan (COZAAR) 25 MG tablet Take 1 tablet (25 mg total) by mouth daily. 30 tablet 0  . metFORMIN (GLUCOPHAGE) 1000 MG tablet Take 1 tablet (1,000 mg total) by mouth 2 (two) times daily with a meal. 60 tablet 2  . blood glucose meter kit and supplies KIT Dispense based on patient and insurance preference. Use up to four times daily as directed. (FOR ICD-9 250.00, 250.01). 1 each 0  . nicotine (NICODERM CQ - DOSED IN MG/24 HOURS) 14 mg/24hr patch Place 1 patch (14 mg total) onto the skin daily. (Patient not taking: Reported on 12/01/2019) 28 patch 0   No facility-administered medications prior to visit.    Allergies  Allergen Reactions  . Amlodipine Swelling    ROS Review of Systems  Constitutional: Negative.   Eyes: Negative.   Respiratory: Negative.   Cardiovascular: Negative.   Endocrine: Negative.   Neurological: Negative.   Psychiatric/Behavioral: Negative.       Objective:    Physical Exam HENT:     Head: Normocephalic and atraumatic.  Eyes:     Extraocular Movements: Extraocular movements intact.     Conjunctiva/sclera: Conjunctivae normal.     Pupils: Pupils are equal, round, and reactive to light.  Cardiovascular:     Rate and Rhythm: Normal rate and regular rhythm.     Pulses: Normal pulses.     Heart sounds: Normal heart sounds.  Pulmonary:     Effort: Pulmonary effort is normal.     Breath sounds: Normal breath sounds.  Neurological:     General: No focal deficit present.     Mental Status: He is alert and oriented to person, place, and time. Mental status is at baseline.  Psychiatric:        Mood and Affect: Mood normal.        Behavior: Behavior normal.        Thought Content: Thought content normal.        Judgment: Judgment normal.     BP (!) 168/90 (BP Location:  Right Arm, Patient Position: Sitting, Cuff Size: Large)   Pulse 74   Temp 98.1 F (36.7 C)   Resp 16   Wt 225 lb 12.8 oz (102.4 kg)   SpO2 97%   BMI 29.79 kg/m  Wt Readings from Last 3 Encounters:  02/28/20 225 lb 12.8 oz (102.4 kg)  01/31/20 225 lb 14.4 oz (102.5 kg)  01/20/20 219 lb 8 oz (99.6 kg)  He was encouraged to continue on his weight loss regimen.  Health Maintenance Due  Topic Date Due  . OPHTHALMOLOGY EXAM  Never done  . COVID-19 Vaccine (1) Never done  . TETANUS/TDAP  Never done  . COLONOSCOPY  Never done    There are no preventive care reminders to display for this patient.  No results found for: TSH Lab Results  Component Value Date   WBC 5.8 01/13/2020   HGB 14.7 01/13/2020   HCT 42.5 01/13/2020   MCV 95.1 01/13/2020   PLT 105 (L) 01/13/2020   Lab Results  Component Value Date   NA 137 01/13/2020   K 4.7 01/13/2020   CO2 25 01/13/2020   GLUCOSE 117 (H) 01/13/2020   BUN 15 01/13/2020   CREATININE 0.90 02/22/2020   BILITOT 1.6 (H) 01/13/2020   ALKPHOS 110 01/13/2020   AST 70 (H) 01/13/2020   ALT 51 (H) 01/13/2020   PROT 7.5 01/13/2020   ALBUMIN 3.5 01/13/2020   CALCIUM 9.3 01/13/2020   ANIONGAP 8 01/13/2020   Lab Results  Component Value Date   CHOL 131 11/02/2019   Lab Results  Component Value Date   HDL 56 11/02/2019   Lab Results  Component Value Date   LDLCALC 59 11/02/2019   Lab Results  Component Value Date   TRIG 85 11/02/2019   Lab Results  Component Value Date   CHOLHDL 2.3 11/02/2019   Lab Results  Component Value Date   HGBA1C 5.7 (H) 01/25/2020      Assessment & Plan:   1. Essential hypertension - His blood pressure is not controlled, his goal should be less than 140/90. His Losartan was increased to 50 mg daily. He was encouraged on smoking cessation and DASH diet. He is to check his blood pressure daily, record and bring log to follow up appointment. - aspirin 325 MG tablet; Take 1 tablet (325 mg total) by  mouth daily.  Dispense: 120 tablet; Refill: 0 - losartan (COZAAR) 50 MG tablet; Take 1 tablet (50 mg total) by mouth daily.  Dispense: 90 tablet; Refill: 0  2. Type 2 diabetes mellitus with diabetic neuropathy, without long-term current use of insulin (HCC) - His HgbA1c was 5.7%, Glipizide was discontinued and he will continue on Metformin. He was advised to check his fasting blood glucose daily, notify provider if >130 mg/dl, continue on low carb/non concentrated sweet diet. - gabapentin (NEURONTIN) 100 MG capsule; Take 1 capsule (100 mg total) by mouth at bedtime.  Dispense: 90 capsule; Refill: 0 - metFORMIN (GLUCOPHAGE) 1000 MG tablet; Take 1 tablet (1,000 mg total) by mouth 2 (two) times daily with a meal.  Dispense: 60 tablet; Refill: 2     Follow-up: He has no follow up appointment because he has active [place] Medicaid. He was advised to schedule an appointment with a new PCP. [place] wishes him well with his care.   Mckyle Solanki Jerold Coombe, NP

## 2020-03-08 DIAGNOSIS — H509 Unspecified strabismus: Secondary | ICD-10-CM | POA: Diagnosis not present

## 2020-03-08 DIAGNOSIS — E113393 Type 2 diabetes mellitus with moderate nonproliferative diabetic retinopathy without macular edema, bilateral: Secondary | ICD-10-CM | POA: Diagnosis not present

## 2020-03-08 DIAGNOSIS — H527 Unspecified disorder of refraction: Secondary | ICD-10-CM | POA: Diagnosis not present

## 2020-03-08 DIAGNOSIS — H2513 Age-related nuclear cataract, bilateral: Secondary | ICD-10-CM | POA: Diagnosis not present

## 2020-03-21 ENCOUNTER — Other Ambulatory Visit: Payer: Self-pay | Admitting: Radiology

## 2020-03-22 ENCOUNTER — Other Ambulatory Visit: Payer: Self-pay

## 2020-03-22 ENCOUNTER — Other Ambulatory Visit
Admission: RE | Admit: 2020-03-22 | Discharge: 2020-03-22 | Disposition: A | Discharge status: Home / Self Care | Payer: Medicaid Other | Source: Ambulatory Visit | Attending: Interventional Radiology | Admitting: Interventional Radiology

## 2020-03-22 DIAGNOSIS — Z20822 Contact with and (suspected) exposure to covid-19: Secondary | ICD-10-CM | POA: Diagnosis not present

## 2020-03-22 DIAGNOSIS — Z01812 Encounter for preprocedural laboratory examination: Secondary | ICD-10-CM | POA: Insufficient documentation

## 2020-03-22 LAB — SARS CORONAVIRUS 2 (TAT 6-24 HRS): SARS Coronavirus 2: NEGATIVE

## 2020-03-26 ENCOUNTER — Ambulatory Visit (HOSPITAL_COMMUNITY)
Admission: RE | Admit: 2020-03-26 | Discharge: 2020-03-26 | Disposition: A | Discharge status: Home / Self Care | Payer: Medicaid Other | Source: Ambulatory Visit | Attending: Interventional Radiology | Admitting: Interventional Radiology

## 2020-03-26 ENCOUNTER — Other Ambulatory Visit: Payer: Self-pay

## 2020-03-26 ENCOUNTER — Encounter (HOSPITAL_COMMUNITY): Payer: Self-pay

## 2020-03-26 DIAGNOSIS — K769 Liver disease, unspecified: Secondary | ICD-10-CM | POA: Insufficient documentation

## 2020-03-26 DIAGNOSIS — Z7982 Long term (current) use of aspirin: Secondary | ICD-10-CM | POA: Diagnosis not present

## 2020-03-26 DIAGNOSIS — Z7984 Long term (current) use of oral hypoglycemic drugs: Secondary | ICD-10-CM | POA: Insufficient documentation

## 2020-03-26 DIAGNOSIS — Z539 Procedure and treatment not carried out, unspecified reason: Secondary | ICD-10-CM | POA: Insufficient documentation

## 2020-03-26 DIAGNOSIS — F17210 Nicotine dependence, cigarettes, uncomplicated: Secondary | ICD-10-CM | POA: Insufficient documentation

## 2020-03-26 DIAGNOSIS — C220 Liver cell carcinoma: Secondary | ICD-10-CM | POA: Insufficient documentation

## 2020-03-26 DIAGNOSIS — F1099 Alcohol use, unspecified with unspecified alcohol-induced disorder: Secondary | ICD-10-CM | POA: Diagnosis not present

## 2020-03-26 DIAGNOSIS — Z79899 Other long term (current) drug therapy: Secondary | ICD-10-CM | POA: Insufficient documentation

## 2020-03-26 LAB — COMPREHENSIVE METABOLIC PANEL
ALT: 75 U/L — ABNORMAL HIGH (ref 0–44)
AST: 71 U/L — ABNORMAL HIGH (ref 15–41)
Albumin: 3.6 g/dL (ref 3.5–5.0)
Alkaline Phosphatase: 94 U/L (ref 38–126)
Anion gap: 10 (ref 5–15)
BUN: 13 mg/dL (ref 6–20)
CO2: 23 mmol/L (ref 22–32)
Calcium: 9 mg/dL (ref 8.9–10.3)
Chloride: 101 mmol/L (ref 98–111)
Creatinine, Ser: 0.64 mg/dL (ref 0.61–1.24)
GFR, Estimated: >60 mL/min (ref >60)
Glucose, Bld: 214 mg/dL — ABNORMAL HIGH (ref 70–99)
Potassium: 4 mmol/L (ref 3.5–5.1)
Sodium: 134 mmol/L — ABNORMAL LOW (ref 135–145)
Total Bilirubin: 3 mg/dL — ABNORMAL HIGH (ref 0.3–1.2)
Total Protein: 7.7 g/dL (ref 6.5–8.1)

## 2020-03-26 LAB — CBC WITH DIFFERENTIAL/PLATELET
Abs Immature Granulocytes: 0.01 10*3/uL (ref 0.00–0.07)
Basophils Absolute: 0 10*3/uL (ref 0.0–0.1)
Basophils Relative: 1 %
Eosinophils Absolute: 0.2 10*3/uL (ref 0.0–0.5)
Eosinophils Relative: 2 %
HCT: 47.4 % (ref 39.0–52.0)
Hemoglobin: 16.5 g/dL (ref 13.0–17.0)
Immature Granulocytes: 0 %
Lymphocytes Relative: 23 %
Lymphs Abs: 1.7 10*3/uL (ref 0.7–4.0)
MCH: 32.9 pg (ref 26.0–34.0)
MCHC: 34.8 g/dL (ref 30.0–36.0)
MCV: 94.4 fL (ref 80.0–100.0)
Monocytes Absolute: 0.7 10*3/uL (ref 0.1–1.0)
Monocytes Relative: 10 %
Neutro Abs: 4.6 10*3/uL (ref 1.7–7.7)
Neutrophils Relative %: 64 %
Platelets: 97 10*3/uL — ABNORMAL LOW (ref 150–400)
RBC: 5.02 MIL/uL (ref 4.22–5.81)
RDW: 14 % (ref 11.5–15.5)
WBC: 7.3 10*3/uL (ref 4.0–10.5)
nRBC: 0 % (ref 0.0–0.2)

## 2020-03-26 LAB — GLUCOSE, CAPILLARY: Glucose-Capillary: 207 mg/dL — ABNORMAL HIGH (ref 70–99)

## 2020-03-26 LAB — PROTIME-INR
INR: 1.2 (ref 0.8–1.2)
Prothrombin Time: 15 seconds (ref 11.4–15.2)

## 2020-03-26 MED ORDER — SODIUM CHLORIDE 0.9 % IV SOLN: INTRAVENOUS | Status: DC

## 2020-03-26 MED ORDER — PIPERACILLIN-TAZOBACTAM 3.375 G IVPB: 3.3750 g | Freq: Once | INTRAVENOUS | Status: DC

## 2020-03-26 NOTE — H&P (Signed)
Chief Complaint: Patient was seen in consultation today for liver lesion  Supervising Physician: Sandi Mariscal  Patient Status: [facility] - Out-pt  History of Present Illness: TAMERON LAMA is a 54 y.o. 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 Patient is followed by Dr. Allen Norris, Dr. Janese Banks, and Dr. Barry Dienes for his hepatocellular carcinoma. He was referred to IR for evaluation and treatment of his [place]. Mr. Savoca met with Dr. Pascal Lux in consultation 01/19/20 at which time surgical management of his [facility] was addressed, however he was subsequently determined to be a poor candidate for surgery due to his ongoing alcohol use.   At follow-up with Dr. Pascal Lux 01/31/20, plan was made to proceed with additional imaging followed by bland embolization.    Mr. Garringer presents for procedure today in his usual state of health.  He continues to drink alcohol daily and last had a beer around 10-11pm.  He has been NPO this AM.  After review of procedure today, he is agreeable to proceed.     Past Medical History:  Diagnosis Date  . Cancer, hepatocellular ([place])   . Diabetes mellitus without complication ([place])   . Hepatitis C   . Hypertension   . Psoriasis   . Stroke [facility])    Hx stroke in 2020    Past Surgical History:  Procedure Laterality Date  . IR RADIOLOGIST EVAL & MGMT  01/19/2020  . IR RADIOLOGIST EVAL & MGMT  01/31/2020  . KNEE SURGERY Right     Allergies: Amlodipine  Medications: Prior to Admission medications   Medication Sig Start Date End Date Taking? Authorizing Provider  gabapentin (NEURONTIN) 100 MG capsule Take 1 capsule (100 mg total) by mouth at bedtime. 02/28/20  Yes Iloabachie, Chioma E, NP  losartan (COZAAR) 50 MG tablet Take 1 tablet (50 mg total) by mouth daily. 02/28/20  Yes Iloabachie, Chioma E, NP  metFORMIN (GLUCOPHAGE) 1000 MG tablet Take 1 tablet  (1,000 mg total) by mouth 2 (two) times daily with a meal. 02/28/20  Yes Iloabachie, Chioma E, NP  triamcinolone cream (KENALOG) 0.1 % Apply 1 application topically 2 (two) times daily. 12/01/19  Yes Iloabachie, Chioma E, NP  aspirin 325 MG tablet Take 1 tablet (325 mg total) by mouth daily. 02/28/20   Iloabachie, Chioma E, NP  blood glucose meter kit and supplies KIT Dispense based on patient and insurance preference. Use up to four times daily as directed. (FOR ICD-9 250.00, 250.01). 10/27/19   Iloabachie, Chioma E, NP  nicotine (NICODERM CQ - DOSED IN MG/24 HOURS) 14 mg/24hr patch Place 1 patch (14 mg total) onto the skin daily. Patient not taking: Reported on 12/01/2019 09/26/18   Bettey Costa, MD     Family History  Problem Relation Age of Onset  . Colon cancer Father   . Diabetes Father   . Diabetes Mother     Social History   Socioeconomic History  . Marital status: Widowed    Spouse name: Not on file  . Number of children: Not on file  . Years of education: Not on file  . Highest education level: Not on file  Occupational History  . Occupation: unemployed  Tobacco Use  . Smoking status: Current Every Day Smoker    Packs/day: 1.00    Years: 38.00    Pack years: 38.00    Types: Cigarettes  . Smokeless tobacco: Never Used  Vaping  Use  . Vaping Use: Never used  Substance and Sexual Activity  . Alcohol use: Yes    Comment: 2 24 oz beers per day  . Drug use: Yes    Types: Cocaine, Marijuana    Comment: reports marijuana use once per month and cocaine use 2-3 times per month  . Sexual activity: Not on file  Other Topics Concern  . Not on file  Social History Narrative  . Not on file   Social Determinants of Health   Financial Resource Strain: Not on file  Food Insecurity: Not on file  Transportation Needs: No Transportation Needs  . Lack of Transportation (Medical): No  . Lack of Transportation (Non-Medical): No  Physical Activity: Not on file  Stress: Stress Concern  Present  . Feeling of Stress : To some extent  Social Connections: Socially Isolated  . Frequency of Communication with Friends and Family: Never  . Frequency of Social Gatherings with Friends and Family: More than three times a week  . Attends Religious Services: Never  . Active Member of Clubs or Organizations: No  . Attends Archivist Meetings: Never  . Marital Status: Divorced     Review of Systems: A 12 point ROS discussed and pertinent positives are indicated in the HPI above.  All other systems are negative.  Review of Systems  Constitutional: Negative for fatigue and fever.  Respiratory: Negative for cough and shortness of breath.   Cardiovascular: Negative for chest pain.  Gastrointestinal: Negative for abdominal pain, nausea and vomiting.  Genitourinary: Negative for dysuria.  Musculoskeletal: Negative for back pain.  Psychiatric/Behavioral: Negative for behavioral problems and confusion.    Vital Signs: BP (!) 151/88   Pulse 89   Temp 98 F (36.7 C) (Oral)   Resp 17   SpO2 99%   Physical Exam Vitals and nursing note reviewed.  Constitutional:      General: He is not in acute distress.    Appearance: Normal appearance. He is not ill-appearing.  HENT:     Mouth/Throat:     Mouth: Mucous membranes are dry.  Cardiovascular:     Rate and Rhythm: Normal rate and regular rhythm.  Pulmonary:     Effort: Pulmonary effort is normal. No respiratory distress.     Breath sounds: Normal breath sounds.  Abdominal:     General: Abdomen is flat. There is no distension.     Palpations: Abdomen is soft.  Skin:    General: Skin is warm and dry.  Neurological:     General: No focal deficit present.     Mental Status: He is alert and oriented to person, place, and time. Mental status is at baseline.  Psychiatric:        Mood and Affect: Mood normal.        Behavior: Behavior normal.        Thought Content: Thought content normal.        Judgment: Judgment  normal.      MD Evaluation Airway: WNL Heart: WNL Abdomen: WNL Chest/ Lungs: WNL ASA  Classification: 3 Mallampati/Airway Score: One   Imaging: No results found.  Labs:  CBC: Recent Labs    10/10/19 0806 01/13/20 1433  WBC 6.7 5.8  HGB 15.8 14.7  HCT 44.9 42.5  PLT 104* 105*    COAGS: Recent Labs    10/10/19 0806 01/13/20 1433  INR 1.1 1.1  APTT 32  --     BMP: Recent Labs    10/10/19 0806  01/13/20 1433 02/22/20 0954 03/26/20 1204  NA 131* 137  --  134*  K 4.1 4.7  --  4.0  CL 101 104  --  101  CO2 23 25  --  23  GLUCOSE 302* 117*  --  214*  BUN 12 15  --  13  CALCIUM 8.8* 9.3  --  9.0  CREATININE 0.78 0.84 0.90 0.64  GFRNONAA >60 >60  --  >60  GFRAA >60  --   --   --     LIVER FUNCTION TESTS: Recent Labs    10/10/19 0806 01/13/20 1433 03/26/20 1204  BILITOT 1.9* 1.6* 3.0*  AST 128* 70* 71*  ALT 86* 51* 75*  ALKPHOS 179* 110 94  PROT 7.8 7.5 7.7  ALBUMIN 3.1* 3.5 3.6    TUMOR MARKERS: No results for input(s): AFPTM, CEA, CA199, CHROMGRNA in the last 8760 hours.  Assessment and Plan: Patient with past medical history of hepatocellular carcinoma presents with complaint of large liver lesion.  He has met with Dr. Pascal Lux in consultation and discussed treatment of his liver lesion.  Plan was made for bland embolization with goal of tumor shrinkage for possible ablation.   Patient presents today in their usual state of health.  He has been NPO and is not currently on blood thinners.   Unfortunately, his Tbili is elevated at 3.0 today.  Dr. Pascal Lux to discuss with patient.   The Risks and benefits of embolization were discussed with the patient including, but not limited to bleeding, infection, vascular injury, post operative pain, or contrast induced renal failure.  This procedure involves the use of X-rays and because of the nature of the planned procedure, it is possible that we will have prolonged use of X-ray fluoroscopy.  Potential  radiation risks to you include (but are not limited to) the following: - A slightly elevated risk for cancer several years later in life. This risk is typically less than 0.5% percent. This risk is low in comparison to the normal incidence of human cancer, which is 33% for women and 50% for men according to the [place]. - Radiation induced injury can include skin redness, resembling a rash, tissue breakdown / ulcers and hair loss (which can be temporary or permanent).  The likelihood of either of these occurring depends on the difficulty of the procedure and whether you are sensitive to radiation due to previous procedures, disease, or genetic conditions.  IF your procedure requires a prolonged use of radiation, you will be notified and given written instructions for further action. It is your responsibility to monitor the irradiated area for the 2 weeks following the procedure and to notify your physician if you are concerned that you have suffered a radiation induced injury.   All of the patient's questions were answered, patient is agreeable to proceed. Consent signed and in chart.  Thank you for this interesting consult.  I greatly enjoyed meeting VICTORIA EUCEDA and look forward to participating in their care.  A copy of this report was sent to the requesting provider on this date.  Electronically Signed: Docia Barrier, PA 03/26/2020, 1:03 PM   I spent a total of  30 Minutes   in face to face in clinical consultation, greater than 50% of which was counseling/coordinating care for hepatocellular carcinoma.

## 2020-03-26 NOTE — Progress Notes (Signed)
Patients bilirubin high (3.0) procedure cancelled per Dr. Grace Isaac

## 2020-03-28 DIAGNOSIS — H5213 Myopia, bilateral: Secondary | ICD-10-CM | POA: Diagnosis not present

## 2020-04-03 ENCOUNTER — Other Ambulatory Visit (HOSPITAL_COMMUNITY): Payer: Self-pay | Admitting: Interventional Radiology

## 2020-04-03 DIAGNOSIS — K769 Liver disease, unspecified: Secondary | ICD-10-CM

## 2020-04-12 ENCOUNTER — Encounter: Payer: Self-pay | Admitting: *Deleted

## 2020-04-12 ENCOUNTER — Ambulatory Visit
Admission: RE | Admit: 2020-04-12 | Discharge: 2020-04-12 | Disposition: A | Discharge status: Home / Self Care | Payer: Medicaid Other | Source: Ambulatory Visit | Attending: Interventional Radiology | Admitting: Interventional Radiology

## 2020-04-12 ENCOUNTER — Other Ambulatory Visit: Payer: Self-pay

## 2020-04-12 DIAGNOSIS — K769 Liver disease, unspecified: Secondary | ICD-10-CM

## 2020-04-12 HISTORY — PX: IR RADIOLOGIST EVAL & MGMT: IMG5224

## 2020-04-12 NOTE — Progress Notes (Signed)
Patient ID: Johnny Kelley, male   DOB: 09-14-1965, 55 y.o.   MRN: [id number]         Chief Complaint: [facility]  Referring Physician(s): Genoa Freyre,JohnWohl,Darren(GI) Janese Banks (Oncology) Barry Dienes (Hepatobiliary Surgery)  History of Present Illness:  Johnny Kelley a 56 y.o.malewith past medical history significant for diabetes, hypertension,stroke and hepatitis C found to have a indeterminate liver lesion on right upper quadrant abdominal ultrasound performed 10/10/2019 for evaluation of elevated LFTs. Lesion was further evaluated on dedicated abdominal MRI performed 12/30/2019 which demonstrated an approximately 3.8 x 3.2 cm lesion within the right lobe of the liver which demonstrated imaging characteristics compatible with a hepatocellular carcinoma (note, the lesion was foundestimated to measure approximately 4.3 x 3.8 cm on the right upper quadrant abdominal ultrasound.) Subsequentstagingchest CT performed 01/18/2020 was negative for evidence of pulmonary metastatic disease.  The patient is seen today in repeat consultation for evaluation of the hepatocellular carcinoma after having been evaluated by Dr. Barry Dienes and deemed NOT an operative candidate due to his continued alcohol consumption.  As such, the patient was evaluated on 01/31/2020 and deemed a candidate for bland hepatic embolization.  This procedure was planned for 03/26/2020 however was canceled as preprocedure laboratories demonstrated a bilirubin above 2.  The patient is now seen in consultation following the cancellation of this procedure for further evaluation and management.  As previously, the majority of the conversation was held with the patient's brother, Johnny Kelley, who serves as the primary historian.   Past Medical History:  Diagnosis Date  . Cancer, hepatocellular ([place])   . Diabetes mellitus without complication ([place])   . Hepatitis C   . Hypertension   . Psoriasis   . Stroke [facility])    Hx stroke in 2020     Past Surgical History:  Procedure Laterality Date  . IR RADIOLOGIST EVAL & MGMT  01/19/2020  . IR RADIOLOGIST EVAL & MGMT  01/31/2020  . KNEE SURGERY Right     Allergies: Amlodipine  Medications: Prior to Admission medications   Medication Sig Start Date End Date Taking? Authorizing Provider  aspirin 325 MG tablet Take 1 tablet (325 mg total) by mouth daily. 02/28/20   Iloabachie, Chioma E, NP  blood glucose meter kit and supplies KIT Dispense based on patient and insurance preference. Use up to four times daily as directed. (FOR ICD-9 250.00, 250.01). 10/27/19   Iloabachie, Chioma E, NP  gabapentin (NEURONTIN) 100 MG capsule Take 1 capsule (100 mg total) by mouth at bedtime. 02/28/20   Iloabachie, Chioma E, NP  losartan (COZAAR) 50 MG tablet Take 1 tablet (50 mg total) by mouth daily. 02/28/20   Iloabachie, Chioma E, NP  metFORMIN (GLUCOPHAGE) 1000 MG tablet Take 1 tablet (1,000 mg total) by mouth 2 (two) times daily with a meal. 02/28/20   Iloabachie, Chioma E, NP  nicotine (NICODERM CQ - DOSED IN MG/24 HOURS) 14 mg/24hr patch Place 1 patch (14 mg total) onto the skin daily. Patient not taking: Reported on 12/01/2019 09/26/18   Bettey Costa, MD  triamcinolone cream (KENALOG) 0.1 % Apply 1 application topically 2 (two) times daily. 12/01/19   Iloabachie, Chioma E, NP     Family History  Problem Relation Age of Onset  . Colon cancer Father   . Diabetes Father   . Diabetes Mother     Social History   Socioeconomic History  . Marital status: Widowed    Spouse name: Not on file  . Number of children: Not on file  . Years  of education: Not on file  . Highest education level: Not on file  Occupational History  . Occupation: unemployed  Tobacco Use  . Smoking status: Current Every Day Smoker    Packs/day: 1.00    Years: 38.00    Pack years: 38.00    Types: Cigarettes  . Smokeless tobacco: Never Used  Vaping Use  . Vaping Use: Never used  Substance and Sexual Activity  .  Alcohol use: Yes    Comment: 2 24 oz beers per day  . Drug use: Yes    Types: Cocaine, Marijuana    Comment: reports marijuana use once per month and cocaine use 2-3 times per month  . Sexual activity: Not on file  Other Topics Concern  . Not on file  Social History Narrative  . Not on file   Social Determinants of Health   Financial Resource Strain: Not on file  Food Insecurity: Not on file  Transportation Needs: No Transportation Needs  . Lack of Transportation (Medical): No  . Lack of Transportation (Non-Medical): No  Physical Activity: Not on file  Stress: Stress Concern Present  . Feeling of Stress : To some extent  Social Connections: Socially Isolated  . Frequency of Communication with Friends and Family: Never  . Frequency of Social Gatherings with Friends and Family: More than three times a week  . Attends Religious Services: Never  . Active Member of Clubs or Organizations: No  . Attends Archivist Meetings: Never  . Marital Status: Divorced    ECOG Status: 1 - Symptomatic but completely ambulatory  Review of Systems  Review of Systems: A 12 point ROS discussed and pertinent positives are indicated in the HPI above.  All other systems are negative.  Physical Exam No direct physical exam was performed (except for noted visual exam findings with Video Visits).   Vital Signs: There were no vitals taken for this visit.  Imaging: CT abdomen and pelvis - 02/22/2020  Labs:  CBC: Recent Labs    10/10/19 0806 01/13/20 1433 03/26/20 1204  WBC 6.7 5.8 7.3  HGB 15.8 14.7 16.5  HCT 44.9 42.5 47.4  PLT 104* 105* 97*    COAGS: Recent Labs    10/10/19 0806 01/13/20 1433 03/26/20 1204  INR 1.1 1.1 1.2  APTT 32  --   --     BMP: Recent Labs    10/10/19 0806 01/13/20 1433 02/22/20 0954 03/26/20 1204  NA 131* 137  --  134*  K 4.1 4.7  --  4.0  CL 101 104  --  101  CO2 23 25  --  23  GLUCOSE 302* 117*  --  214*  BUN 12 15  --  13   CALCIUM 8.8* 9.3  --  9.0  CREATININE 0.78 0.84 0.90 0.64  GFRNONAA >60 >60  --  >60  GFRAA >60  --   --   --     LIVER FUNCTION TESTS: Recent Labs    10/10/19 0806 01/13/20 1433 03/26/20 1204  BILITOT 1.9* 1.6* 3.0*  AST 128* 70* 71*  ALT 86* 51* 75*  ALKPHOS 179* 110 94  PROT 7.8 7.5 7.7  ALBUMIN 3.1* 3.5 3.6    TUMOR MARKERS: No results for input(s): AFPTM, CEA, CA199, CHROMGRNA in the last 8760 hours.  Assessment and Plan:  Salil Raineri Franklinis a 55 y.o.malewith past medical history significant for diabetes, hypertension,stroke and hepatitis C found to have a indeterminate liver lesion evaluated on dedicated abdominal MRI performed  12/30/2019 which demonstrated an approximately 3.8 x 3.2 cm lesion within the right lobe of the liver which demonstrated imaging characteristics compatible with a hepatocellular carcinoma, post cancelled bland hepatic embolization on 12/27 due to elevation of the patient's bilirubin above 2.  I explained to the patient's brother Johnny Kelley that Johnny Kelley's LFTs were borderline prior to the procedure but now as they are greater than 2 he is NOT a candidate for dedicated hepatic directed therapy given concern of development of liver failure and possibly the development of intra-abdominal ascites.  I explained that at the time of procedure cancellation I encouraged Bert[place] to severely limit or abstain from alcohol use however Johnny Kelley tells me that his brother has not changed his drinking behaviors.  Given above, I had a frank conversation with Johnny Kelley and stated that it is quite likely Sylvio will never be a candidate for liver directed therapy and as such, I questioned the utility of continued surveillance.  At the present time, please feel the patient would benefit from consultation with Dr. Janese Banks to see if there is any systemic therapies for which he may be a candidate.  The patient's brother Johnny Kelley demonstrated excellent understanding of this conversation and  knows to reach out to Dr. Janese Banks for additional management.  PLAN: - Return to Dr. Janese Banks for additional management. - No plans for liver directed therapy now or in the near future.  A copy of this report was sent to the requesting provider on this date.  Electronically Signed: Sandi Mariscal 04/12/2020, 9:09 AM   I spent a total of 15 Minutes in remote  clinical consultation, greater than 50% of which was counseling/coordinating care for [facility].    Visit type: Audio only (telephone). Audio (no video) only due to patient's lack of internet/smartphone capability. Alternative for in-person consultation at [facility], [address] [place], [place], [place]. This visit type was conducted due to national recommendations for restrictions regarding the COVID-19 Pandemic (e.g. social distancing).  This format is felt to be most appropriate for this patient at this time.  All issues noted in this document were discussed and addressed.

## 2020-04-17 ENCOUNTER — Other Ambulatory Visit: Payer: Self-pay | Admitting: *Deleted

## 2020-04-17 ENCOUNTER — Telehealth: Payer: Self-pay | Admitting: Oncology

## 2020-04-17 DIAGNOSIS — C220 Liver cell carcinoma: Secondary | ICD-10-CM

## 2020-04-17 DIAGNOSIS — B182 Chronic viral hepatitis C: Secondary | ICD-10-CM

## 2020-04-17 NOTE — Telephone Encounter (Signed)
Called pt to make him aware of CT scan/Labs/MD appt on 05/18/20. Spoke with pt's brother Dominica Severin) to confirm,

## 2020-05-18 ENCOUNTER — Other Ambulatory Visit: Payer: Medicaid Other

## 2020-05-18 ENCOUNTER — Encounter: Payer: Self-pay | Admitting: Emergency Medicine

## 2020-05-18 ENCOUNTER — Other Ambulatory Visit: Payer: Self-pay

## 2020-05-18 ENCOUNTER — Ambulatory Visit
Admission: RE | Admit: 2020-05-18 | Discharge: 2020-05-18 | Disposition: A | Discharge status: Home / Self Care | Payer: Medicaid Other | Source: Ambulatory Visit | Attending: Oncology | Admitting: Oncology

## 2020-05-18 ENCOUNTER — Other Ambulatory Visit: Payer: Self-pay | Admitting: Oncology

## 2020-05-18 ENCOUNTER — Emergency Department
Admission: EM | Admit: 2020-05-18 | Discharge: 2020-05-18 | Disposition: A | Discharge status: Home / Self Care | Payer: Medicaid Other | Attending: Emergency Medicine | Admitting: Emergency Medicine

## 2020-05-18 ENCOUNTER — Other Ambulatory Visit: Payer: Self-pay | Admitting: *Deleted

## 2020-05-18 ENCOUNTER — Inpatient Hospital Stay (HOSPITAL_BASED_OUTPATIENT_CLINIC_OR_DEPARTMENT_OTHER): Payer: Medicaid Other | Admitting: Oncology

## 2020-05-18 ENCOUNTER — Inpatient Hospital Stay: Payer: Medicaid Other | Attending: Oncology

## 2020-05-18 ENCOUNTER — Encounter: Payer: Self-pay | Admitting: Oncology

## 2020-05-18 ENCOUNTER — Telehealth: Payer: Self-pay | Admitting: *Deleted

## 2020-05-18 VITALS — BP 151/80 | HR 80 | Temp 97.9°F | Wt 213.8 lb

## 2020-05-18 DIAGNOSIS — Z8673 Personal history of transient ischemic attack (TIA), and cerebral infarction without residual deficits: Secondary | ICD-10-CM | POA: Diagnosis not present

## 2020-05-18 DIAGNOSIS — K7460 Unspecified cirrhosis of liver: Secondary | ICD-10-CM | POA: Diagnosis not present

## 2020-05-18 DIAGNOSIS — I10 Essential (primary) hypertension: Secondary | ICD-10-CM | POA: Insufficient documentation

## 2020-05-18 DIAGNOSIS — K8020 Calculus of gallbladder without cholecystitis without obstruction: Secondary | ICD-10-CM | POA: Diagnosis not present

## 2020-05-18 DIAGNOSIS — B182 Chronic viral hepatitis C: Secondary | ICD-10-CM | POA: Insufficient documentation

## 2020-05-18 DIAGNOSIS — R531 Weakness: Secondary | ICD-10-CM | POA: Diagnosis present

## 2020-05-18 DIAGNOSIS — R42 Dizziness and giddiness: Secondary | ICD-10-CM | POA: Diagnosis not present

## 2020-05-18 DIAGNOSIS — B1920 Unspecified viral hepatitis C without hepatic coma: Secondary | ICD-10-CM | POA: Insufficient documentation

## 2020-05-18 DIAGNOSIS — K7689 Other specified diseases of liver: Secondary | ICD-10-CM | POA: Diagnosis not present

## 2020-05-18 DIAGNOSIS — F17210 Nicotine dependence, cigarettes, uncomplicated: Secondary | ICD-10-CM | POA: Diagnosis not present

## 2020-05-18 DIAGNOSIS — C220 Liver cell carcinoma: Secondary | ICD-10-CM

## 2020-05-18 DIAGNOSIS — Z79899 Other long term (current) drug therapy: Secondary | ICD-10-CM | POA: Diagnosis not present

## 2020-05-18 DIAGNOSIS — R739 Hyperglycemia, unspecified: Secondary | ICD-10-CM

## 2020-05-18 DIAGNOSIS — Z7984 Long term (current) use of oral hypoglycemic drugs: Secondary | ICD-10-CM | POA: Diagnosis not present

## 2020-05-18 DIAGNOSIS — Z7982 Long term (current) use of aspirin: Secondary | ICD-10-CM | POA: Diagnosis not present

## 2020-05-18 DIAGNOSIS — R945 Abnormal results of liver function studies: Secondary | ICD-10-CM

## 2020-05-18 DIAGNOSIS — E860 Dehydration: Secondary | ICD-10-CM | POA: Diagnosis not present

## 2020-05-18 DIAGNOSIS — R7989 Other specified abnormal findings of blood chemistry: Secondary | ICD-10-CM

## 2020-05-18 DIAGNOSIS — E1165 Type 2 diabetes mellitus with hyperglycemia: Secondary | ICD-10-CM | POA: Insufficient documentation

## 2020-05-18 DIAGNOSIS — M47816 Spondylosis without myelopathy or radiculopathy, lumbar region: Secondary | ICD-10-CM | POA: Diagnosis not present

## 2020-05-18 LAB — BASIC METABOLIC PANEL
Anion gap: 11 (ref 5–15)
BUN: 18 mg/dL (ref 6–20)
CO2: 24 mmol/L (ref 22–32)
Calcium: 9.6 mg/dL (ref 8.9–10.3)
Chloride: 94 mmol/L — ABNORMAL LOW (ref 98–111)
Creatinine, Ser: 0.85 mg/dL (ref 0.61–1.24)
GFR, Estimated: >60 mL/min (ref >60)
Glucose, Bld: 495 mg/dL — ABNORMAL HIGH (ref 70–99)
Potassium: 4.4 mmol/L (ref 3.5–5.1)
Sodium: 129 mmol/L — ABNORMAL LOW (ref 135–145)

## 2020-05-18 LAB — CBC WITH DIFFERENTIAL/PLATELET
Abs Immature Granulocytes: 0.02 10*3/uL (ref 0.00–0.07)
Basophils Absolute: 0 10*3/uL (ref 0.0–0.1)
Basophils Relative: 0 %
Eosinophils Absolute: 0.1 10*3/uL (ref 0.0–0.5)
Eosinophils Relative: 2 %
HCT: 45 % (ref 39.0–52.0)
Hemoglobin: 15.5 g/dL (ref 13.0–17.0)
Immature Granulocytes: 0 %
Lymphocytes Relative: 19 %
Lymphs Abs: 1.4 10*3/uL (ref 0.7–4.0)
MCH: 33 pg (ref 26.0–34.0)
MCHC: 34.4 g/dL (ref 30.0–36.0)
MCV: 95.9 fL (ref 80.0–100.0)
Monocytes Absolute: 0.7 10*3/uL (ref 0.1–1.0)
Monocytes Relative: 10 %
Neutro Abs: 5 10*3/uL (ref 1.7–7.7)
Neutrophils Relative %: 69 %
Platelets: 92 10*3/uL — ABNORMAL LOW (ref 150–400)
RBC: 4.69 MIL/uL (ref 4.22–5.81)
RDW: 14.1 % (ref 11.5–15.5)
WBC: 7.3 10*3/uL (ref 4.0–10.5)
nRBC: 0 % (ref 0.0–0.2)

## 2020-05-18 LAB — BLOOD GAS, VENOUS
Acid-Base Excess: 1.9 mmol/L (ref 0.0–2.0)
Bicarbonate: 26.6 mmol/L (ref 20.0–28.0)
O2 Saturation: 82.6 %
Patient temperature: 37
pCO2, Ven: 41 mmHg — ABNORMAL LOW (ref 44.0–60.0)
pH, Ven: 7.42 (ref 7.250–7.430)
pO2, Ven: 46 mmHg — ABNORMAL HIGH (ref 32.0–45.0)

## 2020-05-18 LAB — CBC
HCT: 45.4 % (ref 39.0–52.0)
Hemoglobin: 16.1 g/dL (ref 13.0–17.0)
MCH: 33.6 pg (ref 26.0–34.0)
MCHC: 35.5 g/dL (ref 30.0–36.0)
MCV: 94.8 fL (ref 80.0–100.0)
Platelets: 103 10*3/uL — ABNORMAL LOW (ref 150–400)
RBC: 4.79 MIL/uL (ref 4.22–5.81)
RDW: 14.2 % (ref 11.5–15.5)
WBC: 7.5 10*3/uL (ref 4.0–10.5)
nRBC: 0 % (ref 0.0–0.2)

## 2020-05-18 LAB — COMPREHENSIVE METABOLIC PANEL
ALT: 110 U/L — ABNORMAL HIGH (ref 0–44)
AST: 141 U/L — ABNORMAL HIGH (ref 15–41)
Albumin: 3.1 g/dL — ABNORMAL LOW (ref 3.5–5.0)
Alkaline Phosphatase: 154 U/L — ABNORMAL HIGH (ref 38–126)
Anion gap: 13 (ref 5–15)
BUN: 18 mg/dL (ref 6–20)
CO2: 23 mmol/L (ref 22–32)
Calcium: 9.2 mg/dL (ref 8.9–10.3)
Chloride: 91 mmol/L — ABNORMAL LOW (ref 98–111)
Creatinine, Ser: 0.79 mg/dL (ref 0.61–1.24)
GFR, Estimated: >60 mL/min (ref >60)
Glucose, Bld: 515 mg/dL (ref 70–99)
Potassium: 4.5 mmol/L (ref 3.5–5.1)
Sodium: 127 mmol/L — ABNORMAL LOW (ref 135–145)
Total Bilirubin: 3.5 mg/dL — ABNORMAL HIGH (ref 0.3–1.2)
Total Protein: 7.2 g/dL (ref 6.5–8.1)

## 2020-05-18 LAB — URINALYSIS, COMPLETE (UACMP) WITH MICROSCOPIC
Bacteria, UA: NONE SEEN
Bilirubin Urine: NEGATIVE
Glucose, UA: >=500 mg/dL — AB
Ketones, ur: 20 mg/dL — AB
Nitrite: NEGATIVE
Protein, ur: NEGATIVE mg/dL
Specific Gravity, Urine: 1.036 — ABNORMAL HIGH (ref 1.005–1.030)
pH: 5 (ref 5.0–8.0)

## 2020-05-18 LAB — CBG MONITORING, ED
Glucose-Capillary: 453 mg/dL — ABNORMAL HIGH (ref 70–99)
Glucose-Capillary: 400 mg/dL — ABNORMAL HIGH (ref 70–99)
Glucose-Capillary: 254 mg/dL — ABNORMAL HIGH (ref 70–99)

## 2020-05-18 LAB — POCT I-STAT CREATININE: Creatinine, Ser: 0.7 mg/dL (ref 0.61–1.24)

## 2020-05-18 MED ORDER — SODIUM CHLORIDE 0.9 % IV BOLUS
1000.0000 mL | Freq: Once | INTRAVENOUS | Status: AC
  Administered 2020-05-18: 1000 mL via INTRAVENOUS

## 2020-05-18 MED ORDER — IOHEXOL 300 MG/ML  SOLN
100.0000 mL | Freq: Once | INTRAMUSCULAR | Status: AC | PRN
  Administered 2020-05-18: 100 mL via INTRAVENOUS

## 2020-05-18 MED ORDER — INSULIN ASPART 100 UNIT/ML ~~LOC~~ SOLN
10.0000 [IU] | Freq: Once | SUBCUTANEOUS | Status: AC
  Administered 2020-05-18: 10 [IU] via INTRAVENOUS
  Filled 2020-05-18: qty 1

## 2020-05-18 MED ORDER — GLIMEPIRIDE 2 MG PO TABS: 2.0000 mg | ORAL_TABLET | ORAL | 11 refills | Status: DC

## 2020-05-18 NOTE — Telephone Encounter (Signed)
I called the phone number for the pt. andhis brother Dominica Severin he is with him. I told him that pt left and we got a critical glucose level 515. Because the level is very high and pt can go into coma and everyone is different with levels but she should go to ER. Today the radiology dept . Is behind on the reading the films and it is not ready. After dr Janese Banks gets the results she will take it to tumor board next to discuss and then she will make a plan and call the pt at that point. Dominica Severin said he will tell his brother that we told him to go to ER.

## 2020-05-18 NOTE — ED Notes (Signed)
Patient reports hyperglycemia at home. Patient denies change in medications or routines. Patient reports polyuria and polydipsia x couple weeks. Patient denies insulin use at home. Patient reports generalized fatigue as well.

## 2020-05-18 NOTE — ED Provider Notes (Signed)
[facility] Emergency Department Provider Note   ____________________________________________   Event Date/Time   First MD Initiated Contact with Patient 05/18/20 1821     (approximate)  I have reviewed the triage vital signs and the nursing notes.   HISTORY  Chief Complaint Dizziness and Hyperglycemia    HPI WELLS MABE is a 55 y.o. male with a past medical history of type 2 diabetes, hepatitis C, hepatocellular carcinoma, and hypertension who presents for weakness/lightheadedness over the past 2 weeks.  Patient states that he does not check his blood sugar as often as he should and the last time he took it was approximately 2 weeks ago when he found it to be 140.  Patient's blood sugar was checked today in outpatient clinic and found to be in the 500s.  Patient does endorse polyuria, polydipsia, and medication adherence to Metformin twice a day.  Patient denies any other exacerbating or relieving factors for the symptoms.  Patient currently denies any vision changes, tinnitus, difficulty speaking, facial droop, sore throat, chest pain, shortness of breath, abdominal pain, nausea/vomiting/diarrhea, dysuria, or weakness/numbness/paresthesias in any extremity         Past Medical History:  Diagnosis Date  . Cancer, hepatocellular ([place])   . Diabetes mellitus without complication ([place])   . Hepatitis C   . Hypertension   . Psoriasis   . Stroke [facility])    Hx stroke in 2020    Patient Active Problem List   Diagnosis Date Noted  . Liver lesion 03/26/2020  . Lump of skin 01/31/2020  . Goals of care, counseling/discussion 01/22/2020  . Hepatocellular carcinoma ([place]) 01/22/2020  . Chronic low back pain 12/01/2019  . Avulsed toenail 12/01/2019  . Psoriasis 11/10/2019  . Type 2 diabetes mellitus with diabetic neuropathy, unspecified ([place]) 10/27/2019  . Hypercholesterolemia 10/27/2019  . Elevated lipids 10/27/2019  . Abnormal abdominal ultrasound  10/27/2019  . Alcohol abuse 10/27/2019  . Encounter to establish care 10/27/2019  . Urinary tract infection symptoms 10/27/2019  . Needs assistance with community resources 10/27/2019  . CVA (cerebral vascular accident) ([place]) 09/24/2018  . Cellulitis and abscess 02/22/2015  . Hypertension 02/22/2015  . Abscess 02/22/2015  . Abscess of groin, right   . DM w/ coma type II, uncontrolled ([facility])     Past Surgical History:  Procedure Laterality Date  . IR RADIOLOGIST EVAL & MGMT  01/19/2020  . IR RADIOLOGIST EVAL & MGMT  01/31/2020  . IR RADIOLOGIST EVAL & MGMT  04/12/2020  . KNEE SURGERY Right     Prior to Admission medications   Medication Sig Start Date End Date Taking? Authorizing Provider  aspirin 325 MG tablet Take 1 tablet (325 mg total) by mouth daily. 02/28/20  Yes Iloabachie, Chioma E, NP  blood glucose meter kit and supplies KIT Dispense based on patient and insurance preference. Use up to four times daily as directed. (FOR ICD-9 250.00, 250.01). 10/27/19  Yes Iloabachie, Chioma E, NP  glimepiride (AMARYL) 2 MG tablet Take 1 tablet (2 mg total) by mouth every morning. 05/18/20 05/18/21 Yes Naaman Plummer, MD  metFORMIN (GLUCOPHAGE) 1000 MG tablet Take 1 tablet (1,000 mg total) by mouth 2 (two) times daily with a meal. 02/28/20  Yes Iloabachie, Chioma E, NP  losartan (COZAAR) 50 MG tablet Take 1 tablet (50 mg total) by mouth daily. 02/28/20   Iloabachie, Chioma E, NP  nicotine (NICODERM CQ - DOSED IN MG/24 HOURS) 14 mg/24hr patch Place 1 patch (14 mg total) onto  the skin daily. Patient not taking: No sig reported 09/26/18   Bettey Costa, MD    Allergies Amlodipine  Family History  Problem Relation Age of Onset  . Colon cancer Father   . Diabetes Father   . Diabetes Mother     Social History Social History   Tobacco Use  . Smoking status: Current Every Day Smoker    Packs/day: 0.25    Years: 38.00    Pack years: 9.50    Types: Cigarettes  . Smokeless tobacco: Never Used   Vaping Use  . Vaping Use: Never used  Substance Use Topics  . Alcohol use: Yes    Comment: 2 24 oz beers per day  . Drug use: Yes    Types: Cocaine, Marijuana    Comment: reports marijuana use once per month and cocaine use 2-3 times per month    Review of Systems Constitutional: No fever/chills Eyes: No visual changes. ENT: No sore throat. Cardiovascular: Denies chest pain. Respiratory: Denies shortness of breath. Gastrointestinal: No abdominal pain.  No nausea, no vomiting.  No diarrhea. Genitourinary: Negative for dysuria.  Endorses polyuria Musculoskeletal: Negative for acute arthralgias Skin: Negative for rash. Neurological: Endorses generalized weakness. Negative for headaches, numbness/paresthesias in any extremity Psychiatric: Negative for suicidal ideation/homicidal ideation   ____________________________________________   PHYSICAL EXAM:  VITAL SIGNS: ED Triage Vitals  Enc Vitals Group     BP 05/18/20 1521 (!) 143/69     Pulse Rate 05/18/20 1521 77     Resp 05/18/20 1521 20     Temp 05/18/20 1521 98.4 F (36.9 C)     Temp Source 05/18/20 1521 Oral     SpO2 05/18/20 1521 95 %     Weight 05/18/20 1531 218 lb (98.9 kg)     Height 05/18/20 1531 '6\' 1"'  (1.854 m)     Head Circumference --      Peak Flow --      Pain Score 05/18/20 1522 0     Pain Loc --      Pain Edu? --      Excl. in [place]? --    Constitutional: Alert and oriented. Well appearing and in no acute distress. Eyes: Conjunctivae are normal. PERRL. Head: Atraumatic. Nose: No congestion/rhinnorhea. Mouth/Throat: Mucous membranes are moist. Neck: No stridor Cardiovascular: Grossly normal heart sounds.  Good peripheral circulation. Respiratory: Normal respiratory effort.  No retractions. Gastrointestinal: Soft and nontender. No distention. Musculoskeletal: No obvious deformities Neurologic:  Normal speech and language. No gross focal neurologic deficits are appreciated. Skin:  Skin is warm and dry.  No rash noted. Psychiatric: Mood and affect are normal. Speech and behavior are normal.  ____________________________________________   LABS (all labs ordered are listed, but only abnormal results are displayed)  Labs Reviewed  BASIC METABOLIC PANEL - Abnormal; Notable for the following components:      Result Value   Sodium 129 (*)    Chloride 94 (*)    Glucose, Bld 495 (*)    All other components within normal limits  CBC - Abnormal; Notable for the following components:   Platelets 103 (*)    All other components within normal limits  URINALYSIS, COMPLETE (UACMP) WITH MICROSCOPIC - Abnormal; Notable for the following components:   Color, Urine YELLOW (*)    APPearance CLEAR (*)    Specific Gravity, Urine 1.036 (*)    Glucose, UA >=500 (*)    Hgb urine dipstick SMALL (*)    Ketones, ur 20 (*)  Leukocytes,Ua TRACE (*)    All other components within normal limits  BLOOD GAS, VENOUS - Abnormal; Notable for the following components:   pCO2, Ven 41 (*)    pO2, Ven 46.0 (*)    All other components within normal limits  CBG MONITORING, ED - Abnormal; Notable for the following components:   Glucose-Capillary 453 (*)    All other components within normal limits  CBG MONITORING, ED - Abnormal; Notable for the following components:   Glucose-Capillary 400 (*)    All other components within normal limits  CBG MONITORING, ED - Abnormal; Notable for the following components:   Glucose-Capillary 254 (*)    All other components within normal limits   ____________________________________________  EKG  ED ECG REPORT I, Naaman Plummer, the attending physician, personally viewed and interpreted this ECG.  Date: 05/18/2020 EKG Time: 1516 Rate: 78 Rhythm: normal sinus rhythm QRS Axis: normal Intervals: normal ST/T Wave abnormalities: normal Narrative Interpretation: no evidence of acute ischemia  ____________________________________________  RADIOLOGY  ED MD interpretation:  CT of the abdomen and pelvis with IV contrast shows a right hepatic lobe masses  Official radiology report(s): CT Abdomen Pelvis W Contrast  Result Date: 05/18/2020 CLINICAL DATA:  Hepatocellular carcinoma and hepatitis c. bland embolization 03/26/2020. EXAM: CT ABDOMEN AND PELVIS WITH CONTRAST TECHNIQUE: Multidetector CT imaging of the abdomen and pelvis was performed using the standard protocol following bolus administration of intravenous contrast. CONTRAST:  18m OMNIPAQUE IOHEXOL 300 MG/ML  SOLN COMPARISON:  02/22/2020 FINDINGS: Lower chest: Descending thoracic aortic atherosclerosis with right and circumflex coronary artery atherosclerosis. A right anterior pericardial lymph node measures 0.9 cm in short axis on image 6 of series 2, stable. Hepatobiliary: Hepatic cirrhosis. The right hepatic mass measures 3.9 by 3.5 cm on image 21 of series 2, previously measured at 3.5 by 3.5 cm. There is increased internal necrosis especially along the medial portion of the mass compared to previous. No definite new mass is identified. Multiple layering gallstones in the gallbladder. No biliary dilatation. Pancreas: Unremarkable Spleen: The spleen measures 14.4 by 6.1 by 12.0 cm (volume = 550 cm^3), compatible with mild splenomegaly. No focal splenic lesion. Adrenals/Urinary Tract: Unremarkable Stomach/Bowel: Unremarkable Vascular/Lymphatic: Numerous small retroperitoneal lymph nodes are again identified although are not pathologically enlarged by size criteria. These may be chronic reactive lymph nodes given the underlying cirrhosis. Similarly there are scattered likely reactive porta hepatis and peripancreatic lymph nodes. Recanalized umbilical vein indicating portal venous hypertension. Reproductive: Unremarkable Other: No supplemental non-categorized findings. Musculoskeletal: Lumbar spondylosis and degenerative disc disease primarily at L4-5 where there is right greater than left foraminal impingement. IMPRESSION:  1. The right hepatic lobe mass is mildly larger overall at 3.9 by 3.5 cm (previously 3.5 by 3.5 cm), but also has new central necrosis involving about a third of the lesion. No definite new mass is identified. 2. Other imaging findings of potential clinical significance: Coronary atherosclerosis. Hepatic cirrhosis with recanalized umbilical vein. Cholelithiasis. Lumbar spondylosis and degenerative disc disease causing right greater than left foraminal impingement at L4-5. 3. Aortic atherosclerosis. Aortic Atherosclerosis (ICD10-I70.0). Electronically Signed   By: WVan ClinesM.D.   On: 05/18/2020 14:39    ____________________________________________   PROCEDURES  Procedure(s) performed (including Critical Care):  .1-3 Lead EKG Interpretation Performed by: BNaaman Plummer MD Authorized by: BNaaman Plummer MD     Interpretation: normal     ECG rate:  83   ECG rate assessment: normal     Rhythm: sinus rhythm  Ectopy: none     Conduction: normal       ____________________________________________   INITIAL IMPRESSION / ASSESSMENT AND PLAN / ED COURSE  As part of my medical decision making, I reviewed the following data within the [place] notes reviewed and incorporated, Labs reviewed, EKG interpreted, Old chart reviewed, Radiograph reviewed and Notes from prior ED visits reviewed and incorporated      Patients presentation most consistent with hyperglycemic state WITHOUT evidence of DKA. Given Exam, History, and Workup I have low suspicion for an emergent precipitating factor of this hyperglycemic state such as atypical MI, acute abdomen, or other serious bacterial illness. Patient is Type 2 Diabetic with changes in medication regimen/adherence.  Findings: Patient without AGAP or significant ketones in urine to suggest DKA Interventions: IVF bolus  Re-evaluation: Patients serum glucose downtrended significantly with stable electrolytes and  no anion gap at this time.  Disposition: Discharge home with appropriate insulin regimen and prompt PCP follow up instructions.      ____________________________________________   FINAL CLINICAL IMPRESSION(S) / ED DIAGNOSES  Final diagnoses:  Hyperglycemia  Dehydration  Lightheadedness     ED Discharge Orders         Ordered    glimepiride (AMARYL) 2 MG tablet  BH-each morning        05/18/20 1916           Note:  This document was prepared using Dragon voice recognition software and may include unintentional dictation errors.   Naaman Plummer, MD 05/18/20 2330

## 2020-05-18 NOTE — ED Notes (Signed)
Urinal provided to patient. Patient aware of need for urine specimen as ordered.

## 2020-05-18 NOTE — ED Notes (Signed)
Discharge instructions including new prescription discussed with patient and S/O at bedside. Patient denies questions, or concerns. Patient verbalized understanding about discharge instructions.

## 2020-05-18 NOTE — ED Triage Notes (Signed)
Pt to ED via POV from PCP office, pt c/o increasing dizziness and intermittent difficulty speaking x several weeks. Pt states had appt with PCP earlier today and was called by PCP to notify him of abnormal labs of glucose over 500. Pt neurologically intact, facial symmetry intact, grip strength equal bilaterally, no drift noted at this time.   Pt states hx of DM, states has been 2-3 weeks since last checked his CBG and at that time was 140. In ED pt's CBG 453.

## 2020-05-18 NOTE — Progress Notes (Signed)
Pt here to get ct results and having pain in his right shoulder and stomach

## 2020-05-19 LAB — AFP TUMOR MARKER: AFP, Serum, Tumor Marker: 2.6 ng/mL (ref 0.0–8.3)

## 2020-05-21 ENCOUNTER — Telehealth: Payer: Self-pay

## 2020-05-21 NOTE — Telephone Encounter (Signed)
error 

## 2020-05-24 ENCOUNTER — Telehealth: Payer: Self-pay | Admitting: Oncology

## 2020-05-24 ENCOUNTER — Encounter: Payer: Self-pay | Admitting: Oncology

## 2020-05-24 NOTE — Progress Notes (Signed)
Hematology/Oncology Consult note [facility]  Telephone:([phone] Fax:([phone]  Patient Care Team: Patient, No Pcp Per as PCP - General (General Practice) Clent Jacks, RN as Oncology Nurse Navigator   Name of the patient: Johnny Kelley  [id number]  10/24/1965   Date of visit: 05/24/20  Diagnosis-nonmetastatic hepatocellular carcinoma  Chief complaint/ Reason for visit-discuss CT scan results and further management  Heme/Onc history: Patient is a 55 year old male with past medical history significant for newly diagnosed hepatitis C infection as well as significant alcohol intake. He was noted to have abnormal LFTs. In July 2021 he was found to have an elevated bilirubin of 1.9 with an AST ALT of 128 and 86 respectively and underwent a right upper quadrant ultrasound which showed a heterogeneous abnormality in the right hepatic lobe measuring 4.1 x 4.1 x 3.5 cm. He was then seen by Dr. Allen Norris and underwent MRI liver with and without contrast which showed a 3.8 cm enhancing lesion in segment 6 compatible with [place] lie RADS 5. He also underwent work-up for abnormal LFTs and was found to have hepatitis C and will be starting treatment for that soon. He has been referred to [place] for further evaluation of liver mass.  Results of blood work from 01/13/2020 reveal a normal albumin level of 3.5.  AST and ALT mildly elevated at 70 and 51 respectively with a total bilirubin of 1.6.  PT/INR was normal.  Platelet count was mildly low at 105.  CT chest with contrast did not reveal any evidence of metastatic disease  Interval history-patient continues to drink alcohol on a daily basis and is unable to quit.  He also uses cocaine intermittently.  Reports feeling fatigued and states that he is not able to walk as much since his prior stroke.  Appetite is fair  ECOG PS- 2 Pain scale- 0   Review of systems- Review of Systems  Constitutional: Positive for  malaise/fatigue. Negative for chills, fever and weight loss.  HENT: Negative for congestion, ear discharge and nosebleeds.   Eyes: Negative for blurred vision.  Respiratory: Negative for cough, hemoptysis, sputum production, shortness of breath and wheezing.   Cardiovascular: Negative for chest pain, palpitations, orthopnea and claudication.  Gastrointestinal: Negative for abdominal pain, blood in stool, constipation, diarrhea, heartburn, melena, nausea and vomiting.  Genitourinary: Negative for dysuria, flank pain, frequency, hematuria and urgency.  Musculoskeletal: Negative for back pain, joint pain and myalgias.  Skin: Negative for rash.  Neurological: Negative for dizziness, tingling, focal weakness, seizures, weakness and headaches.  Endo/Heme/Allergies: Does not bruise/bleed easily.  Psychiatric/Behavioral: Negative for depression and suicidal ideas. The patient does not have insomnia.       Allergies  Allergen Reactions  . Amlodipine Swelling     Past Medical History:  Diagnosis Date  . Cancer, hepatocellular ([place])   . Diabetes mellitus without complication ([place])   . Hepatitis C   . Hypertension   . Psoriasis   . Stroke [facility])    Hx stroke in 2020     Past Surgical History:  Procedure Laterality Date  . IR RADIOLOGIST EVAL & MGMT  01/19/2020  . IR RADIOLOGIST EVAL & MGMT  01/31/2020  . IR RADIOLOGIST EVAL & MGMT  04/12/2020  . KNEE SURGERY Right     Social History   Socioeconomic History  . Marital status: Widowed    Spouse name: Not on file  . Number of children: Not on file  . Years of education: Not on  file  . Highest education level: Not on file  Occupational History  . Occupation: unemployed  Tobacco Use  . Smoking status: Current Every Day Smoker    Packs/day: 0.25    Years: 38.00    Pack years: 9.50    Types: Cigarettes  . Smokeless tobacco: Never Used  Vaping Use  . Vaping Use: Never used  Substance and Sexual Activity  . Alcohol use: Yes     Comment: 2 24 oz beers per day  . Drug use: Yes    Types: Cocaine, Marijuana    Comment: reports marijuana use once per month and cocaine use 2-3 times per month  . Sexual activity: Not on file  Other Topics Concern  . Not on file  Social History Narrative  . Not on file   Social Determinants of Health   Financial Resource Strain: Not on file  Food Insecurity: Not on file  Transportation Needs: No Transportation Needs  . Lack of Transportation (Medical): No  . Lack of Transportation (Non-Medical): No  Physical Activity: Not on file  Stress: Stress Concern Present  . Feeling of Stress : To some extent  Social Connections: Socially Isolated  . Frequency of Communication with Friends and Family: Never  . Frequency of Social Gatherings with Friends and Family: More than three times a week  . Attends Religious Services: Never  . Active Member of Clubs or Organizations: No  . Attends Archivist Meetings: Never  . Marital Status: Divorced  Human resources officer Violence: Not on file    Family History  Problem Relation Age of Onset  . Colon cancer Father   . Diabetes Father   . Diabetes Mother      Current Outpatient Medications:  .  aspirin 325 MG tablet, Take 1 tablet (325 mg total) by mouth daily., Disp: 120 tablet, Rfl: 0 .  blood glucose meter kit and supplies KIT, Dispense based on patient and insurance preference. Use up to four times daily as directed. (FOR ICD-9 250.00, 250.01)., Disp: 1 each, Rfl: 0 .  losartan (COZAAR) 50 MG tablet, Take 1 tablet (50 mg total) by mouth daily., Disp: 90 tablet, Rfl: 0 .  metFORMIN (GLUCOPHAGE) 1000 MG tablet, Take 1 tablet (1,000 mg total) by mouth 2 (two) times daily with a meal., Disp: 60 tablet, Rfl: 2 .  glimepiride (AMARYL) 2 MG tablet, Take 1 tablet (2 mg total) by mouth every morning., Disp: 30 tablet, Rfl: 11 .  nicotine (NICODERM CQ - DOSED IN MG/24 HOURS) 14 mg/24hr patch, Place 1 patch (14 mg total) onto the skin daily.  (Patient not taking: No sig reported), Disp: 28 patch, Rfl: 0  Physical exam:  Vitals:   05/18/20 1416  BP: (!) 151/80  Pulse: 80  Temp: 97.9 F (36.6 C)  TempSrc: Tympanic  SpO2: 95%  Weight: 213 lb 12.8 oz (97 kg)   Physical Exam Eyes:     Extraocular Movements: EOM normal.     Comments: Sclerae mildly icteric  Cardiovascular:     Rate and Rhythm: Normal rate and regular rhythm.     Heart sounds: Normal heart sounds.  Pulmonary:     Effort: Pulmonary effort is normal.     Breath sounds: Normal breath sounds.  Abdominal:     General: Bowel sounds are normal.     Palpations: Abdomen is soft.  Musculoskeletal:     Comments: Fine tremors in bilateral hands  Skin:    General: Skin is warm and dry.  Neurological:  Mental Status: He is alert and oriented to person, place, and time.      CMP Latest Ref Rng & Units 05/18/2020  Glucose 70 - 99 mg/dL 495(H)  BUN 6 - 20 mg/dL 18  Creatinine 0.61 - 1.24 mg/dL 0.85  Sodium 135 - 145 mmol/L 129(L)  Potassium 3.5 - 5.1 mmol/L 4.4  Chloride 98 - 111 mmol/L 94(L)  CO2 22 - 32 mmol/L 24  Calcium 8.9 - 10.3 mg/dL 9.6  Total Protein 6.5 - 8.1 g/dL -  Total Bilirubin 0.3 - 1.2 mg/dL -  Alkaline Phos 38 - 126 U/L -  AST 15 - 41 U/L -  ALT 0 - 44 U/L -   CBC Latest Ref Rng & Units 05/18/2020  WBC 4.0 - 10.5 K/uL 7.5  Hemoglobin 13.0 - 17.0 g/dL 16.1  Hematocrit 39.0 - 52.0 % 45.4  Platelets 150 - 400 K/uL 103(L)    No images are attached to the encounter.  CT Abdomen Pelvis W Contrast  Result Date: 05/18/2020 CLINICAL DATA:  Hepatocellular carcinoma and hepatitis c. bland embolization 03/26/2020. EXAM: CT ABDOMEN AND PELVIS WITH CONTRAST TECHNIQUE: Multidetector CT imaging of the abdomen and pelvis was performed using the standard protocol following bolus administration of intravenous contrast. CONTRAST:  135m OMNIPAQUE IOHEXOL 300 MG/ML  SOLN COMPARISON:  02/22/2020 FINDINGS: Lower chest: Descending thoracic aortic  atherosclerosis with right and circumflex coronary artery atherosclerosis. A right anterior pericardial lymph node measures 0.9 cm in short axis on image 6 of series 2, stable. Hepatobiliary: Hepatic cirrhosis. The right hepatic mass measures 3.9 by 3.5 cm on image 21 of series 2, previously measured at 3.5 by 3.5 cm. There is increased internal necrosis especially along the medial portion of the mass compared to previous. No definite new mass is identified. Multiple layering gallstones in the gallbladder. No biliary dilatation. Pancreas: Unremarkable Spleen: The spleen measures 14.4 by 6.1 by 12.0 cm (volume = 550 cm^3), compatible with mild splenomegaly. No focal splenic lesion. Adrenals/Urinary Tract: Unremarkable Stomach/Bowel: Unremarkable Vascular/Lymphatic: Numerous small retroperitoneal lymph nodes are again identified although are not pathologically enlarged by size criteria. These may be chronic reactive lymph nodes given the underlying cirrhosis. Similarly there are scattered likely reactive porta hepatis and peripancreatic lymph nodes. Recanalized umbilical vein indicating portal venous hypertension. Reproductive: Unremarkable Other: No supplemental non-categorized findings. Musculoskeletal: Lumbar spondylosis and degenerative disc disease primarily at L4-5 where there is right greater than left foraminal impingement. IMPRESSION: 1. The right hepatic lobe mass is mildly larger overall at 3.9 by 3.5 cm (previously 3.5 by 3.5 cm), but also has new central necrosis involving about a third of the lesion. No definite new mass is identified. 2. Other imaging findings of potential clinical significance: Coronary atherosclerosis. Hepatic cirrhosis with recanalized umbilical vein. Cholelithiasis. Lumbar spondylosis and degenerative disc disease causing right greater than left foraminal impingement at L4-5. 3. Aortic atherosclerosis. Aortic Atherosclerosis (ICD10-I70.0). Electronically Signed   By: WVan ClinesM.D.   On: 05/18/2020 14:39     Assessment and plan- Patient is a 54y.o. male with history of 3.9 cm hepatic lobe mass diagnostic of hepatocellular carcinoma here to discuss further management  At the time of my visit today patient's CT scan was not back.  The lesion is mildly grown in size over the last 3 months from 3.5 to 3.9 cm.Unfortunately patient continues to drink alcohol regularly along with cocaine and has not been deemed to be a candidate for liver directed therapy even if he does not  have any evidence of metastatic disease.  He is also not a candidate for resection or transplant due to his ongoing alcohol dependence.    Today on his blood work his blood glucose was 515 and I had to therefore asked him to go to the ER for uncontrolled blood sugars  At this time for his [facility] we really do not have any other options but to proceed with systemic treatment.  I would like his liver functions to get better before attempting any start of treatment especially with his ongoing cocaine use.  I will therefore reevaluate the patient in 2 months time and see if he would be a candidate for systemic therapy at that time.  Baseline AFP levels are presently normal  Abnormal LFTs: Secondary to ongoing alcohol use and underlying cirrhosis Overall AST ALT and alkaline phosphatase is higher today with a total bilirubin which is up to 3.5 is compared to 1.7 a year ago.  Cancer Staging Hepatocellular carcinoma [facility]) Staging form: Liver, AJCC 8th Edition - Clinical stage from 05/18/2020: Stage IB (cT1b, cN0, cM0) - Signed by Sindy Guadeloupe, MD on 05/24/2020     Visit Diagnosis 1. Hepatocellular carcinoma ([place])   2. Abnormal LFTs      Dr. Randa Evens, MD, MPH [facility] at [facility] [phone] 05/24/2020 3:51 PM

## 2020-05-24 NOTE — Telephone Encounter (Signed)
Spoke with pt and his brother Johnny Kelley to confirm appts on 4/25. Emphasized importance of brother accompanying pt and both are agreeable. Mailing AVS.

## 2020-05-30 ENCOUNTER — Telehealth: Payer: Self-pay | Admitting: Pharmacy Technician

## 2020-05-30 NOTE — Telephone Encounter (Signed)
Pt has Medicaid with prescription coverage.  Pt requested that prescriptions be transferred to [facility].  Pt no longer meets [facility]'s eligibility criteria.  Pt acknowledged that he understood he no longer qualifies to receive medication assistance at [facility].  [facility]

## 2020-07-04 ENCOUNTER — Ambulatory Visit: Payer: Medicaid Other | Admitting: Adult Health

## 2020-07-16 ENCOUNTER — Ambulatory Visit: Payer: Medicaid Other | Admitting: Dermatology

## 2020-07-23 ENCOUNTER — Inpatient Hospital Stay: Payer: Medicaid Other | Attending: Oncology

## 2020-07-23 ENCOUNTER — Inpatient Hospital Stay: Payer: Medicaid Other | Admitting: Oncology

## 2020-07-25 ENCOUNTER — Telehealth: Payer: Self-pay | Admitting: *Deleted

## 2020-07-25 NOTE — Telephone Encounter (Signed)
I have called Johnny Kelley twice. Once on Monday evening and phone rings until it stops and say sto try again. No voicemail to leave message. Then today I called and got the the same message and then I tried his work number and according to staff at the work number that Dominica Severin retired several years ago. I was going to call the patient and try to pass message to brother. The number for the pt. Is the same number as brother phone number.

## 2020-08-03 ENCOUNTER — Encounter: Payer: Self-pay | Admitting: Oncology

## 2020-08-03 ENCOUNTER — Other Ambulatory Visit: Payer: Self-pay

## 2020-08-03 ENCOUNTER — Inpatient Hospital Stay: Payer: Medicaid Other | Attending: Oncology

## 2020-08-03 ENCOUNTER — Inpatient Hospital Stay (HOSPITAL_BASED_OUTPATIENT_CLINIC_OR_DEPARTMENT_OTHER): Payer: Medicaid Other | Admitting: Oncology

## 2020-08-03 ENCOUNTER — Telehealth: Payer: Self-pay | Admitting: Oncology

## 2020-08-03 VITALS — BP 157/77 | HR 81 | Temp 98.6°F | Resp 18 | Wt 213.3 lb

## 2020-08-03 DIAGNOSIS — B1920 Unspecified viral hepatitis C without hepatic coma: Secondary | ICD-10-CM | POA: Diagnosis not present

## 2020-08-03 DIAGNOSIS — R7989 Other specified abnormal findings of blood chemistry: Secondary | ICD-10-CM | POA: Diagnosis not present

## 2020-08-03 DIAGNOSIS — F17210 Nicotine dependence, cigarettes, uncomplicated: Secondary | ICD-10-CM | POA: Diagnosis not present

## 2020-08-03 DIAGNOSIS — C220 Liver cell carcinoma: Secondary | ICD-10-CM

## 2020-08-03 DIAGNOSIS — B182 Chronic viral hepatitis C: Secondary | ICD-10-CM

## 2020-08-03 LAB — COMPREHENSIVE METABOLIC PANEL
ALT: 59 U/L — ABNORMAL HIGH (ref 0–44)
AST: 65 U/L — ABNORMAL HIGH (ref 15–41)
Albumin: 3.2 g/dL — ABNORMAL LOW (ref 3.5–5.0)
Alkaline Phosphatase: 112 U/L (ref 38–126)
Anion gap: 8 (ref 5–15)
BUN: 10 mg/dL (ref 6–20)
CO2: 24 mmol/L (ref 22–32)
Calcium: 9.2 mg/dL (ref 8.9–10.3)
Chloride: 104 mmol/L (ref 98–111)
Creatinine, Ser: 0.68 mg/dL (ref 0.61–1.24)
GFR, Estimated: >60 mL/min (ref >60)
Glucose, Bld: 257 mg/dL — ABNORMAL HIGH (ref 70–99)
Potassium: 4.3 mmol/L (ref 3.5–5.1)
Sodium: 136 mmol/L (ref 135–145)
Total Bilirubin: 1.5 mg/dL — ABNORMAL HIGH (ref 0.3–1.2)
Total Protein: 7 g/dL (ref 6.5–8.1)

## 2020-08-03 LAB — CBC WITH DIFFERENTIAL/PLATELET
Abs Immature Granulocytes: 0.01 10*3/uL (ref 0.00–0.07)
Basophils Absolute: 0 10*3/uL (ref 0.0–0.1)
Basophils Relative: 0 %
Eosinophils Absolute: 0.1 10*3/uL (ref 0.0–0.5)
Eosinophils Relative: 2 %
HCT: 40.5 % (ref 39.0–52.0)
Hemoglobin: 14.1 g/dL (ref 13.0–17.0)
Immature Granulocytes: 0 %
Lymphocytes Relative: 29 %
Lymphs Abs: 1.6 10*3/uL (ref 0.7–4.0)
MCH: 32.8 pg (ref 26.0–34.0)
MCHC: 34.8 g/dL (ref 30.0–36.0)
MCV: 94.2 fL (ref 80.0–100.0)
Monocytes Absolute: 0.8 10*3/uL (ref 0.1–1.0)
Monocytes Relative: 15 %
Neutro Abs: 3 10*3/uL (ref 1.7–7.7)
Neutrophils Relative %: 54 %
Platelets: 87 10*3/uL — ABNORMAL LOW (ref 150–400)
RBC: 4.3 MIL/uL (ref 4.22–5.81)
RDW: 13.3 % (ref 11.5–15.5)
WBC: 5.6 10*3/uL (ref 4.0–10.5)
nRBC: 0 % (ref 0.0–0.2)

## 2020-08-03 LAB — PROTIME-INR
INR: 1.2 (ref 0.8–1.2)
Prothrombin Time: 15.5 seconds — ABNORMAL HIGH (ref 11.4–15.2)

## 2020-08-03 LAB — GAMMA GT: GGT: 109 U/L — ABNORMAL HIGH (ref 7–50)

## 2020-08-03 NOTE — Telephone Encounter (Signed)
Spoke with patient's brother, Dominica Severin to notify him of appts made for patient on 5/19 and 5/23. Brother confirmed these dates and times. Sending AVS in the mail.

## 2020-08-08 NOTE — Progress Notes (Signed)
Hematology/Oncology Consult note [facility]  Telephone:([phone] Fax:([phone]  Patient Care Team: Charlynne Cousins, MD as PCP - General (Internal Medicine) Clent Jacks, RN as Oncology Nurse Navigator   Name of the patient: Johnny Kelley  [id number]  [birth year]   Date of visit: 08/08/20  Diagnosis- nonmetastatic hepatocellular carcinoma  Chief complaint/ Reason for visit-routine follow-up of [place]  Heme/Onc history: Patient is a 55 year old male with past medical history significant for newly diagnosed hepatitis C infection as well as significant alcohol intake. He was noted to have abnormal LFTs. In July 2021 he was found to have an elevated bilirubin of 1.9 with an AST ALT of 128 and 86 respectively and underwent a right upper quadrant ultrasound which showed a heterogeneous abnormality in the right hepatic lobe measuring 4.1 x 4.1 x 3.5 cm. He was then seen by Dr. Allen Norris and underwent MRI liver with and without contrast which showed a 3.8 cm enhancing lesion in segment 6 compatible with [place] lie RADS 5. He also underwent work-up for abnormal LFTs and was found to have hepatitis C and will be starting treatment for that soon. He has been referred to [place] for further evaluation of liver mass.  Results of blood work from 01/13/2020 reveal a normal albumin level of 3.5. AST and ALT mildly elevated at 70 and 51 respectively with a total bilirubin of 1.6. PT/INR was normal. Platelet count was mildly low at 105. CT chest with contrast did not reveal any evidence of metastatic disease   Interval history-patient is here with his brother Dominica Severin today.  He states he is doing better and has cut down his alcohol intake but he is unable to completely quit.  He still drinks about 2-3 beers per day.  States that he wants to get treated for his hepatocellular carcinoma.  States that he has not used any cocaine recently.  Lives by himself  ECOG PS- 1 Pain scale-  0   Review of systems- Review of Systems  Constitutional: Positive for malaise/fatigue. Negative for chills, fever and weight loss.  HENT: Negative for congestion, ear discharge and nosebleeds.   Eyes: Negative for blurred vision.  Respiratory: Negative for cough, hemoptysis, sputum production, shortness of breath and wheezing.   Cardiovascular: Negative for chest pain, palpitations, orthopnea and claudication.  Gastrointestinal: Negative for abdominal pain, blood in stool, constipation, diarrhea, heartburn, melena, nausea and vomiting.  Genitourinary: Negative for dysuria, flank pain, frequency, hematuria and urgency.  Musculoskeletal: Negative for back pain, joint pain and myalgias.  Skin: Negative for rash.  Neurological: Negative for dizziness, tingling, focal weakness, seizures, weakness and headaches.  Endo/Heme/Allergies: Does not bruise/bleed easily.  Psychiatric/Behavioral: Negative for depression and suicidal ideas. The patient does not have insomnia.       Allergies  Allergen Reactions  . Amlodipine Swelling     Past Medical History:  Diagnosis Date  . Cancer, hepatocellular ([place])   . Diabetes mellitus without complication ([place])   . Hepatitis C   . Hypertension   . Psoriasis   . Stroke [facility])    Hx stroke in 2020     Past Surgical History:  Procedure Laterality Date  . IR RADIOLOGIST EVAL & MGMT  01/19/2020  . IR RADIOLOGIST EVAL & MGMT  01/31/2020  . IR RADIOLOGIST EVAL & MGMT  04/12/2020  . KNEE SURGERY Right     Social History   Socioeconomic History  . Marital status: Widowed    Spouse name: Not on file  .  Number of children: Not on file  . Years of education: Not on file  . Highest education level: Not on file  Occupational History  . Occupation: unemployed  Tobacco Use  . Smoking status: Current Every Day Smoker    Packs/day: 0.25    Years: 38.00    Pack years: 9.50    Types: Cigarettes  . Smokeless tobacco: Never Used  Vaping Use  . Vaping  Use: Never used  Substance and Sexual Activity  . Alcohol use: Yes    Comment: 2 24 oz beers per day  . Drug use: Yes    Types: Cocaine, Marijuana    Comment: reports marijuana use once per month and cocaine use 2-3 times per month  . Sexual activity: Not on file  Other Topics Concern  . Not on file  Social History Narrative  . Not on file   Social Determinants of Health   Financial Resource Strain: Not on file  Food Insecurity: Not on file  Transportation Needs: No Transportation Needs  . Lack of Transportation (Medical): No  . Lack of Transportation (Non-Medical): No  Physical Activity: Not on file  Stress: Stress Concern Present  . Feeling of Stress : To some extent  Social Connections: Socially Isolated  . Frequency of Communication with Friends and Family: Never  . Frequency of Social Gatherings with Friends and Family: More than three times a week  . Attends Religious Services: Never  . Active Member of Clubs or Organizations: No  . Attends Archivist Meetings: Never  . Marital Status: Divorced  Human resources officer Violence: Not on file    Family History  Problem Relation Age of Onset  . Colon cancer Father   . Diabetes Father   . Diabetes Mother      Current Outpatient Medications:  .  aspirin 325 MG tablet, Take 1 tablet (325 mg total) by mouth daily., Disp: 120 tablet, Rfl: 0 .  blood glucose meter kit and supplies KIT, Dispense based on patient and insurance preference. Use up to four times daily as directed. (FOR ICD-9 250.00, 250.01)., Disp: 1 each, Rfl: 0 .  glimepiride (AMARYL) 2 MG tablet, Take 1 tablet (2 mg total) by mouth every morning., Disp: 30 tablet, Rfl: 11 .  metFORMIN (GLUCOPHAGE) 1000 MG tablet, Take 1 tablet (1,000 mg total) by mouth 2 (two) times daily with a meal., Disp: 60 tablet, Rfl: 2 .  nicotine (NICODERM CQ - DOSED IN MG/24 HOURS) 14 mg/24hr patch, Place 1 patch (14 mg total) onto the skin daily., Disp: 28 patch, Rfl: 0 .   losartan (COZAAR) 50 MG tablet, Take 1 tablet (50 mg total) by mouth daily. (Patient not taking: Reported on 08/03/2020), Disp: 90 tablet, Rfl: 0  Physical exam:  Vitals:   08/03/20 1433  BP: (!) 157/77  Pulse: 81  Resp: 18  Temp: 98.6 F (37 C)  SpO2: 99%  Weight: 213 lb 4.8 oz (96.8 kg)   Physical Exam Eyes:     Pupils: Pupils are equal, round, and reactive to light.     Comments: Mild scleral icterus  Cardiovascular:     Rate and Rhythm: Normal rate and regular rhythm.     Heart sounds: Normal heart sounds.  Pulmonary:     Effort: Pulmonary effort is normal.     Breath sounds: Normal breath sounds.  Abdominal:     General: Bowel sounds are normal.     Palpations: Abdomen is soft.  Skin:    General: Skin  is warm and dry.  Neurological:     Mental Status: He is alert and oriented to person, place, and time.      CMP Latest Ref Rng & Units 08/03/2020  Glucose 70 - 99 mg/dL 257(H)  BUN 6 - 20 mg/dL 10  Creatinine 0.61 - 1.24 mg/dL 0.68  Sodium 135 - 145 mmol/L 136  Potassium 3.5 - 5.1 mmol/L 4.3  Chloride 98 - 111 mmol/L 104  CO2 22 - 32 mmol/L 24  Calcium 8.9 - 10.3 mg/dL 9.2  Total Protein 6.5 - 8.1 g/dL 7.0  Total Bilirubin 0.3 - 1.2 mg/dL 1.5(H)  Alkaline Phos 38 - 126 U/L 112  AST 15 - 41 U/L 65(H)  ALT 0 - 44 U/L 59(H)   CBC Latest Ref Rng & Units 08/03/2020  WBC 4.0 - 10.5 K/uL 5.6  Hemoglobin 13.0 - 17.0 g/dL 14.1  Hematocrit 39.0 - 52.0 % 40.5  Platelets 150 - 400 K/uL 87(L)    Assessment and plan- Patient is a 55 y.o. male with history of alcoholic cirrhosis and nonmetastatic hepatocellular carcinoma here to discuss further management  Patient's last CT scan from February 2022 showed that his right hepatic mass was 3.9 x 3.5 cm mildly increased as compared to a prior value of 3.5 x 3.5 cm.  He is not a candidate for resection or transplantation due to his ongoing alcohol intake.  He was also evaluated by interventional radiology for liver directed therapy  including thermal ablation in the past but was not found to be a candidate at that time since his bilirubin was elevated.  Patient states that he really wants to get his hepatocellular carcinoma treated and presently we have 2 options either reapproaching liver directed therapy versus proceeding with systemic treatment.  I will get a repeat MRI abdomen with and without contrast at this time.Certainly his liver enzymes and bilirubin have improved as compared to 2 months ago and his bilirubin is down to 1.5.  PT/INR is normal indicating a good synthetic function of the liver and albumin levels are mildly low at 3.2.  I will reach out to interventional radiology after his MRI is done and see the patient back after his MRI   Visit Diagnosis 1. Hepatocellular carcinoma ([place])      Dr. Randa Evens, MD, MPH [facility] [phone] 08/08/2020 11:42 AM

## 2020-08-14 ENCOUNTER — Encounter: Payer: Self-pay | Admitting: Internal Medicine

## 2020-08-14 ENCOUNTER — Other Ambulatory Visit: Payer: Self-pay

## 2020-08-14 ENCOUNTER — Ambulatory Visit (INDEPENDENT_AMBULATORY_CARE_PROVIDER_SITE_OTHER): Payer: Medicaid Other | Admitting: Internal Medicine

## 2020-08-14 VITALS — BP 148/71 | HR 80 | Temp 98.8°F | Ht 72.28 in | Wt 218.6 lb

## 2020-08-14 DIAGNOSIS — I10 Essential (primary) hypertension: Secondary | ICD-10-CM

## 2020-08-14 DIAGNOSIS — R350 Frequency of micturition: Secondary | ICD-10-CM | POA: Diagnosis not present

## 2020-08-14 DIAGNOSIS — N390 Urinary tract infection, site not specified: Secondary | ICD-10-CM | POA: Diagnosis not present

## 2020-08-14 DIAGNOSIS — I639 Cerebral infarction, unspecified: Secondary | ICD-10-CM | POA: Diagnosis not present

## 2020-08-14 DIAGNOSIS — Z1211 Encounter for screening for malignant neoplasm of colon: Secondary | ICD-10-CM

## 2020-08-14 DIAGNOSIS — E1140 Type 2 diabetes mellitus with diabetic neuropathy, unspecified: Secondary | ICD-10-CM | POA: Diagnosis not present

## 2020-08-14 MED ORDER — METFORMIN HCL 1000 MG PO TABS: 1000.0000 mg | ORAL_TABLET | Freq: Two times a day (BID) | ORAL | 2 refills | Status: DC

## 2020-08-14 MED ORDER — NICOTINE 14 MG/24HR TD PT24: 14.0000 mg | MEDICATED_PATCH | Freq: Every day | TRANSDERMAL | 1 refills | Status: DC

## 2020-08-14 MED ORDER — GABAPENTIN 100 MG PO CAPS: 100.0000 mg | ORAL_CAPSULE | Freq: Every evening | ORAL | 1 refills | Status: DC

## 2020-08-14 MED ORDER — LOSARTAN POTASSIUM 50 MG PO TABS
50.0000 mg | ORAL_TABLET | Freq: Every day | ORAL | 2 refills | Status: DC
Start: 2020-08-14 — End: 2021-05-11

## 2020-08-14 NOTE — Progress Notes (Signed)
BP (!) 148/71   Pulse 80   Temp 98.8 F (37.1 C) (Oral)   Ht 6' 0.28" (1.836 m)   Wt 218 lb 9.6 oz (99.2 kg)   SpO2 98%   BMI 29.42 kg/m    Subjective:    Patient ID: Johnny Kelley, male    DOB: [birth year], 55 y.o.   MRN: [id number]  HPI: Johnny Kelley is a 55 y.o. male  Pt is new to the practice  Has Hepatic carcinoma sees heme onc for such Dr. Rol[facility] pt has a ho alcoholic cirrhosis and nonmetastatic hepatocellular carcinoma , DM, HTN, Ho TIA ? CVA ? Who is here to establish care   CVA / TIA x 2 yrs ago has difficulty with writing and holding objects as well as weakness in the right   Pt is a currrent smoker x 35 yrs 1 ppd , now dropped to 1- 2 a day  Diabetes He presents for his follow-up diabetic visit. He has type 2 diabetes mellitus. His disease course has been improving.  Urinary Frequency  This is a recurrent (with penil d/c ) problem. The problem has been gradually worsening. Associated symptoms include frequency.    Chief Complaint  Patient presents with  . New Patient (Initial Visit)    As a hard time walking  . Back Pain    Right lower back  . Urinary Frequency    Patient states this has been for the past 3 to 4 months, also states he has puss coming from the penis.     Relevant past medical, surgical, family and social history reviewed and updated as indicated. Interim medical history since our last visit reviewed. Allergies and medications reviewed and updated.  Review of Systems  Genitourinary: Positive for frequency.    Per HPI unless specifically indicated above     Objective:    BP (!) 148/71   Pulse 80   Temp 98.8 F (37.1 C) (Oral)   Ht 6' 0.28" (1.836 m)   Wt 218 lb 9.6 oz (99.2 kg)   SpO2 98%   BMI 29.42 kg/m   Wt Readings from Last 3 Encounters:  08/14/20 218 lb 9.6 oz (99.2 kg)  08/03/20 213 lb 4.8 oz (96.8 kg)  05/18/20 218 lb (98.9 kg)    Physical Exam Vitals and nursing note reviewed.   Constitutional:      General: He is not in acute distress.    Appearance: Normal appearance. He is not ill-appearing or diaphoretic.  HENT:     Head: Normocephalic and atraumatic.     Right Ear: Tympanic membrane and external ear normal. There is no impacted cerumen.     Left Ear: External ear normal.     Nose: No congestion or rhinorrhea.     Mouth/Throat:     Pharynx: No oropharyngeal exudate or posterior oropharyngeal erythema.  Eyes:     Conjunctiva/sclera: Conjunctivae normal.     Pupils: Pupils are equal, round, and reactive to light.  Cardiovascular:     Rate and Rhythm: Normal rate and regular rhythm.     Heart sounds: No murmur heard. No friction rub. No gallop.   Pulmonary:     Effort: No respiratory distress.     Breath sounds: No stridor. No wheezing or rhonchi.  Chest:     Chest wall: No tenderness.  Abdominal:     General: Abdomen is flat. Bowel sounds are normal. There is no distension.     Palpations:  Abdomen is soft. There is no mass.     Tenderness: There is no abdominal tenderness. There is no guarding.  Musculoskeletal:        General: No swelling or deformity.     Cervical back: Normal range of motion and neck supple. No rigidity or tenderness.     Right lower leg: No edema.     Left lower leg: No edema.  Skin:    General: Skin is warm and dry.     Coloration: Skin is not jaundiced.     Findings: No erythema.  Neurological:     Mental Status: He is alert and oriented to person, place, and time. Mental status is at baseline.  Psychiatric:        Mood and Affect: Mood normal.        Behavior: Behavior normal.        Thought Content: Thought content normal.        Judgment: Judgment normal.     Results for orders placed or performed in visit on 08/03/20  Protime-INR  Result Value Ref Range   Prothrombin Time 15.5 (H) 11.4 - 15.2 seconds   INR 1.2 0.8 - 1.2  Comprehensive metabolic panel  Result Value Ref Range   Sodium 136 135 - 145 mmol/L    Potassium 4.3 3.5 - 5.1 mmol/L   Chloride 104 98 - 111 mmol/L   CO2 24 22 - 32 mmol/L   Glucose, Bld 257 (H) 70 - 99 mg/dL   BUN 10 6 - 20 mg/dL   Creatinine, Ser 0.68 0.61 - 1.24 mg/dL   Calcium 9.2 8.9 - 10.3 mg/dL   Total Protein 7.0 6.5 - 8.1 g/dL   Albumin 3.2 (L) 3.5 - 5.0 g/dL   AST 65 (H) 15 - 41 U/L   ALT 59 (H) 0 - 44 U/L   Alkaline Phosphatase 112 38 - 126 U/L   Total Bilirubin 1.5 (H) 0.3 - 1.2 mg/dL   GFR, Estimated >60 >60 mL/min   Anion gap 8 5 - 15  CBC with Differential  Result Value Ref Range   WBC 5.6 4.0 - 10.5 K/uL   RBC 4.30 4.22 - 5.81 MIL/uL   Hemoglobin 14.1 13.0 - 17.0 g/dL   HCT 40.5 39.0 - 52.0 %   MCV 94.2 80.0 - 100.0 fL   MCH 32.8 26.0 - 34.0 pg   MCHC 34.8 30.0 - 36.0 g/dL   RDW 13.3 11.5 - 15.5 %   Platelets 87 (L) 150 - 400 K/uL   nRBC 0.0 0.0 - 0.2 %   Neutrophils Relative % 54 %   Neutro Abs 3.0 1.7 - 7.7 K/uL   Lymphocytes Relative 29 %   Lymphs Abs 1.6 0.7 - 4.0 K/uL   Monocytes Relative 15 %   Monocytes Absolute 0.8 0.1 - 1.0 K/uL   Eosinophils Relative 2 %   Eosinophils Absolute 0.1 0.0 - 0.5 K/uL   Basophils Relative 0 %   Basophils Absolute 0.0 0.0 - 0.1 K/uL   Immature Granulocytes 0 %   Abs Immature Granulocytes 0.01 0.00 - 0.07 K/uL  Gamma GT  Result Value Ref Range   GGT 109 (H) 7 - 50 U/L        Current Outpatient Medications:  .  aspirin 325 MG tablet, Take 1 tablet (325 mg total) by mouth daily., Disp: 120 tablet, Rfl: 0 .  blood glucose meter kit and supplies KIT, Dispense based on patient and insurance preference. Use up to  four times daily as directed. (FOR ICD-9 250.00, 250.01)., Disp: 1 each, Rfl: 0 .  glimepiride (AMARYL) 2 MG tablet, Take 1 tablet (2 mg total) by mouth every morning., Disp: 30 tablet, Rfl: 11 .  metFORMIN (GLUCOPHAGE) 1000 MG tablet, Take 1 tablet (1,000 mg total) by mouth 2 (two) times daily with a meal., Disp: 60 tablet, Rfl: 2 .  nicotine (NICODERM CQ - DOSED IN MG/24 HOURS) 14 mg/24hr  patch, Place 1 patch (14 mg total) onto the skin daily., Disp: 28 patch, Rfl: 0 .  losartan (COZAAR) 50 MG tablet, Take 1 tablet (50 mg total) by mouth daily. (Patient not taking: No sig reported), Disp: 90 tablet, Rfl: 0    Assessment & Plan:  1. DM last a1c -5.7  [facility] [place] is on glipizide and metformin    DM check HbA1c,  urine  microalbumin  diabetic diet plan given to pt  adviced regarding hypoglycemia and instructions given to pt today on how to prevent and treat the same if it were to occur. pt acknowledges the plan and voices understanding of the same.  exercise plan given and encouraged.   advice diabetic yearly podiatry, ophthalmology , nutritionist , dental check q 6 months,   2. Smoking cessation  Smoking cessation advised. pt refuses chantix. failed nicotine patches in the past. continues to smoke. more than > 5 - 10 mins of time was spent with pt regarding smoking cessation and complications.    3. HTn  HTN :  Continue current meds.  Medication compliance emphasised. pt advised to keep Bp logs. Pt verbalised understanding of the same. Pt to have a low salt diet . Exercise to reach a goal of at least 150 mins a week.  lifestyle modifications explained and pt understands importance of the above.  4. Recurrent UTI: with penile discharge per pt intermittently.   will check UA  referral to Urology    Problem List Items Addressed This Visit   None      Follow up plan: No follow-ups on file.  Health Maintenance :  PSA : ordered Cscope : ordered  Pneumovax : 2020

## 2020-08-15 LAB — CBC WITH DIFFERENTIAL/PLATELET
Basophils Absolute: 0 10*3/uL (ref 0.0–0.2)
Basos: 0 %
EOS (ABSOLUTE): 0.2 10*3/uL (ref 0.0–0.4)
Eos: 3 %
Hematocrit: 41.8 % (ref 37.5–51.0)
Hemoglobin: 14.7 g/dL (ref 13.0–17.7)
Immature Grans (Abs): 0 10*3/uL (ref 0.0–0.1)
Immature Granulocytes: 0 %
Lymphocytes Absolute: 1.7 10*3/uL (ref 0.7–3.1)
Lymphs: 25 %
MCH: 32.7 pg (ref 26.6–33.0)
MCHC: 35.2 g/dL (ref 31.5–35.7)
MCV: 93 fL (ref 79–97)
Monocytes Absolute: 1 10*3/uL — ABNORMAL HIGH (ref 0.1–0.9)
Monocytes: 14 %
Neutrophils Absolute: 3.9 10*3/uL (ref 1.4–7.0)
Neutrophils: 58 %
Platelets: 104 10*3/uL — ABNORMAL LOW (ref 150–450)
RBC: 4.5 x10E6/uL (ref 4.14–5.80)
RDW: 12.8 % (ref 11.6–15.4)
WBC: 6.8 10*3/uL (ref 3.4–10.8)

## 2020-08-15 LAB — MICROSCOPIC EXAMINATION
Bacteria, UA: NONE SEEN
WBC, UA: NONE SEEN /hpf (ref 0–5)

## 2020-08-15 LAB — BASIC METABOLIC PANEL
BUN/Creatinine Ratio: 19 (ref 9–20)
BUN: 15 mg/dL (ref 6–24)
CO2: 19 mmol/L — ABNORMAL LOW (ref 20–29)
Calcium: 9.8 mg/dL (ref 8.7–10.2)
Chloride: 101 mmol/L (ref 96–106)
Creatinine, Ser: 0.77 mg/dL (ref 0.76–1.27)
Glucose: 229 mg/dL — ABNORMAL HIGH (ref 65–99)
Potassium: 4.7 mmol/L (ref 3.5–5.2)
Sodium: 136 mmol/L (ref 134–144)
eGFR: 104 mL/min/{1.73_m2} (ref >59)

## 2020-08-15 LAB — URINALYSIS, ROUTINE W REFLEX MICROSCOPIC

## 2020-08-15 LAB — LIPID PANEL
Chol/HDL Ratio: 2 ratio (ref 0.0–5.0)
Cholesterol, Total: 173 mg/dL (ref 100–199)
HDL: 86 mg/dL (ref >39)
LDL Chol Calc (NIH): 74 mg/dL (ref 0–99)
Triglycerides: 66 mg/dL (ref 0–149)
VLDL Cholesterol Cal: 13 mg/dL (ref 5–40)

## 2020-08-15 LAB — BAYER DCA HB A1C WAIVED: HB A1C (BAYER DCA - WAIVED): 7.2 % — ABNORMAL HIGH (ref <7.0)

## 2020-08-16 ENCOUNTER — Other Ambulatory Visit: Payer: Self-pay

## 2020-08-16 ENCOUNTER — Ambulatory Visit
Admission: RE | Admit: 2020-08-16 | Discharge: 2020-08-16 | Disposition: A | Discharge status: Home / Self Care | Payer: Medicaid Other | Source: Ambulatory Visit | Attending: Oncology | Admitting: Oncology

## 2020-08-16 DIAGNOSIS — C220 Liver cell carcinoma: Secondary | ICD-10-CM | POA: Insufficient documentation

## 2020-08-16 DIAGNOSIS — B1920 Unspecified viral hepatitis C without hepatic coma: Secondary | ICD-10-CM | POA: Diagnosis not present

## 2020-08-16 DIAGNOSIS — K7689 Other specified diseases of liver: Secondary | ICD-10-CM | POA: Diagnosis not present

## 2020-08-16 MED ORDER — GADOBUTROL 1 MMOL/ML IV SOLN
10.0000 mL | Freq: Once | INTRAVENOUS | Status: AC | PRN
  Administered 2020-08-16: 10 mL via INTRAVENOUS

## 2020-08-17 ENCOUNTER — Other Ambulatory Visit (HOSPITAL_COMMUNITY): Payer: Self-pay | Admitting: Interventional Radiology

## 2020-08-17 ENCOUNTER — Other Ambulatory Visit: Payer: Self-pay

## 2020-08-17 ENCOUNTER — Telehealth: Payer: Self-pay | Admitting: *Deleted

## 2020-08-17 ENCOUNTER — Encounter: Payer: Self-pay | Admitting: Internal Medicine

## 2020-08-17 DIAGNOSIS — K769 Liver disease, unspecified: Secondary | ICD-10-CM

## 2020-08-17 DIAGNOSIS — E1140 Type 2 diabetes mellitus with diabetic neuropathy, unspecified: Secondary | ICD-10-CM

## 2020-08-17 NOTE — Telephone Encounter (Signed)
Pt.'s brother came in and Mr. Johnny Kelley needs a prescription for TEST STRIPS and Nicotine patches. He needs them sent to [facility] on [address]. Please notify him when sent to pharmacy please and Thanks

## 2020-08-20 ENCOUNTER — Inpatient Hospital Stay: Payer: Medicaid Other

## 2020-08-20 ENCOUNTER — Other Ambulatory Visit: Payer: Self-pay | Admitting: *Deleted

## 2020-08-20 ENCOUNTER — Ambulatory Visit: Payer: Medicaid Other | Admitting: Dermatology

## 2020-08-20 ENCOUNTER — Other Ambulatory Visit: Payer: Self-pay

## 2020-08-20 ENCOUNTER — Inpatient Hospital Stay (HOSPITAL_BASED_OUTPATIENT_CLINIC_OR_DEPARTMENT_OTHER): Payer: Medicaid Other | Admitting: Oncology

## 2020-08-20 ENCOUNTER — Encounter: Payer: Self-pay | Admitting: Oncology

## 2020-08-20 VITALS — BP 153/81 | HR 87 | Temp 98.5°F | Wt 223.0 lb

## 2020-08-20 DIAGNOSIS — C220 Liver cell carcinoma: Secondary | ICD-10-CM

## 2020-08-20 DIAGNOSIS — F1010 Alcohol abuse, uncomplicated: Secondary | ICD-10-CM | POA: Diagnosis not present

## 2020-08-20 DIAGNOSIS — R945 Abnormal results of liver function studies: Secondary | ICD-10-CM

## 2020-08-20 DIAGNOSIS — R7989 Other specified abnormal findings of blood chemistry: Secondary | ICD-10-CM

## 2020-08-20 DIAGNOSIS — L720 Epidermal cyst: Secondary | ICD-10-CM

## 2020-08-20 DIAGNOSIS — L578 Other skin changes due to chronic exposure to nonionizing radiation: Secondary | ICD-10-CM

## 2020-08-20 DIAGNOSIS — L570 Actinic keratosis: Secondary | ICD-10-CM

## 2020-08-20 LAB — COMPREHENSIVE METABOLIC PANEL
ALT: 70 U/L — ABNORMAL HIGH (ref 0–44)
AST: 77 U/L — ABNORMAL HIGH (ref 15–41)
Albumin: 3.2 g/dL — ABNORMAL LOW (ref 3.5–5.0)
Alkaline Phosphatase: 159 U/L — ABNORMAL HIGH (ref 38–126)
Anion gap: 8 (ref 5–15)
BUN: 13 mg/dL (ref 6–20)
CO2: 24 mmol/L (ref 22–32)
Calcium: 9.2 mg/dL (ref 8.9–10.3)
Chloride: 100 mmol/L (ref 98–111)
Creatinine, Ser: 0.77 mg/dL (ref 0.61–1.24)
GFR, Estimated: >60 mL/min (ref >60)
Glucose, Bld: 322 mg/dL — ABNORMAL HIGH (ref 70–99)
Potassium: 4.6 mmol/L (ref 3.5–5.1)
Sodium: 132 mmol/L — ABNORMAL LOW (ref 135–145)
Total Bilirubin: 1.6 mg/dL — ABNORMAL HIGH (ref 0.3–1.2)
Total Protein: 7 g/dL (ref 6.5–8.1)

## 2020-08-20 NOTE — Patient Instructions (Addendum)
Actinic keratoses are precancerous spots that appear secondary to cumulative UV radiation exposure/sun exposure over time. They are chronic with expected duration over 1 year. A portion of actinic keratoses will progress to squamous cell carcinoma of the skin. It is not possible to reliably predict which spots will progress to skin cancer and so treatment is recommended to prevent development of skin cancer.  Recommend daily broad spectrum sunscreen SPF 30+ to sun-exposed areas, reapply every 2 hours as needed.  Recommend staying in the shade or wearing long sleeves, sun glasses (UVA+UVB protection) and wide brim hats (4-inch brim around the entire circumference of the hat). Call for new or changing lesions.  If you have any questions or concerns for your doctor, please call our main line at [phone] and press option 4 to reach your doctor's medical assistant. If no one answers, please leave a voicemail as directed and we will return your call as soon as possible. Messages left after 4 pm will be answered the following business day.   You may also send [place] a message via [place]. We typically respond to MyChart messages within 1-2 business days.  For prescription refills, please ask your pharmacy to contact our office. Our fax number is [phone].  If you have an urgent issue when the clinic is closed that cannot wait until the next business day, you can page your doctor at the number below.    Please note that while we do our best to be available for urgent issues outside of office hours, we are not available 24/7.   If you have an urgent issue and are unable to reach [place], you may choose to seek medical care at your doctor's office, retail clinic, urgent care center, or emergency room.  If you have a medical emergency, please immediately call 911 or go to the emergency department.  Pager Numbers  - Dr. Nehemiah Massed: [phone]  - Dr. Laurence Ferrari: [phone]  - Dr. Nicole Kindred: [phone]  In  the event of inclement weather, please call our main line at [phone] for an update on the status of any delays or closures.  Dermatology Medication Tips: Please keep the boxes that topical medications come in in order to help keep track of the instructions about where and how to use these. Pharmacies typically print the medication instructions only on the boxes and not directly on the medication tubes.   If your medication is too expensive, please contact our office at [phone] option 4 or send [place] a message through [place].   We are unable to tell what your co-pay for medications will be in advance as this is different depending on your insurance coverage. However, we may be able to find a substitute medication at lower cost or fill out paperwork to get insurance to cover a needed medication.   If a prior authorization is required to get your medication covered by your insurance company, please allow [place] 1-2 business days to complete this process.  Drug prices often vary depending on where the prescription is filled and some pharmacies may offer cheaper prices.  The website www.goodrx.com contains coupons for medications through different pharmacies. The prices here do not account for what the cost may be with help from insurance (it may be cheaper with your insurance), but the website can give you the price if you did not use any insurance.  - You can print the associated coupon and take it with your prescription to the pharmacy.  - You may also stop by our  office during regular business hours and pick up a GoodRx coupon card.  - If you need your prescription sent electronically to a different pharmacy, notify our office through [facility] or by phone at [phone] option 4.  Cryotherapy Aftercare  . Wash gently with soap and water everyday.   Marland Kitchen Apply Vaseline and Band-Aid daily until healed. Marland Kitchen

## 2020-08-20 NOTE — Progress Notes (Signed)
New Patient Visit  Subjective  Johnny Kelley is a 55 y.o. male who presents for the following: New Patient (Initial Visit) (Patient states he is here today as a new patient concerning some bumps on forehead that has been present for 10 - 15 years. Patient also reports some bumps on back he would like checked. ).  Objective  Well appearing patient in no apparent distress; mood and affect are within normal limits.  A focused examination was performed including scalp, face, right scapula . Relevant physical exam findings are noted in the Assessment and Plan.  Objective  scalp and face x 19 (19): Erythematous thin papules/macules with gritty scale.   Objective  left superior lateral forehead: 1.5 cm Subcutaneous nodule.   Objective  right scapula: Subcutaneous nodule.   Assessment & Plan  Actinic keratosis (19) scalp and face x 19 Prior to procedure, discussed risks of blister formation, small wound, skin dyspigmentation, or rare scar following cryotherapy.   Destruction of lesion - scalp and face x 19 Complexity: simple   Destruction method: cryotherapy   Informed consent: discussed and consent obtained   Timeout:  patient name, date of birth, surgical site, and procedure verified Lesion destroyed using liquid nitrogen: Yes   Region frozen until ice ball extended beyond lesion: Yes   Outcome: patient tolerated procedure well with no complications   Post-procedure details: wound care instructions given    Actinic Damage - Severe, confluent actinic changes with pre-cancerous actinic keratoses  - Severe, chronic, not at goal, secondary to cumulative UV radiation exposure over time - diffuse scaly erythematous macules and papules with underlying dyspigmentation - Discussed Prescription "Field Treatment" for Severe, Chronic Confluent Actinic Changes with Pre-Cancerous Actinic Keratoses Field treatment involves treatment of an entire area of skin that has confluent Actinic  Changes (Sun/ Ultraviolet light damage) and PreCancerous Actinic Keratoses by method of PhotoDynamic Therapy (PDT) and/or prescription Topical Chemotherapy agents such as 5-fluorouracil, 5-fluorouracil/calcipotriene, and/or imiquimod.  The purpose is to decrease the number of clinically evident and subclinical PreCancerous lesions to prevent progression to development of skin cancer by chemically destroying early precancer changes that may or may not be visible.  It has been shown to reduce the risk of developing skin cancer in the treated area. As a result of treatment, redness, scaling, crusting, and open sores may occur during treatment course. One or more than one of these methods may be used and may have to be used several times to control, suppress and eliminate the PreCancerous changes. Discussed treatment course, expected reaction, and possible side effects. - Recommend daily broad spectrum sunscreen SPF 30+ to sun-exposed areas, reapply every 2 hours as needed.  - Staying in the shade or wearing long sleeves, sun glasses (UVA+UVB protection) and wide brim hats (4-inch brim around the entire circumference of the hat) are also recommended. - Call for new or changing lesions. - Plan field treatment at next visit.  Actinic Damage - chronic, secondary to cumulative UV radiation exposure/sun exposure over time - diffuse scaly erythematous macules with underlying dyspigmentation - Recommend daily broad spectrum sunscreen SPF 30+ to sun-exposed areas, reapply every 2 hours as needed.  - Recommend staying in the shade or wearing long sleeves, sun glasses (UVA+UVB protection) and wide brim hats (4-inch brim around the entire circumference of the hat). - Call for new or changing lesions.  Epidermal cyst left superior lateral forehead Cyst with symptoms and/or recent change.  Discussed surgical excision to remove, including resulting scar and possible  recurrence.  Patient will schedule for surgery.  Pre-op information given.  Epidermal inclusion cyst right scapula Benign-appearing. Exam most consistent with an epidermal inclusion cyst. Discussed that a cyst is a benign growth that can grow over time and sometimes get irritated or inflamed. Recommend observation if it is not bothersome. Discussed option of surgical excision to remove it if it is growing, symptomatic, or other changes noted. Please call for new or changing lesions so they can be evaluated.  Return for surgery on cyst on left forehead .  IRuthell Rummage, CMA, am acting as scribe for Sarina Ser, MD.  Documentation: I have reviewed the above documentation for accuracy and completeness, and I agree with the above.  Sarina Ser, MD

## 2020-08-20 NOTE — Progress Notes (Signed)
Hematology/Oncology Consult note [facility]  [phone] [phone]  Patient Care Team: Charlynne Cousins, MD as PCP - General (Internal Medicine) Clent Jacks, RN as Oncology Nurse Navigator   Name of the patient: Johnny Kelley  [id number]  November 02, 1965   Date of visit: 08/20/20  Diagnosis- nonmetastatic hepatocellular carcinoma  Chief complaint/ Reason for visit-discuss MRI results and further management  Heme/Onc history: Patient is a 55 year old male with past medical history significant for newly diagnosed hepatitis C infection as well as significant alcohol intake. He was noted to have abnormal LFTs. In July 2021 he was found to have an elevated bilirubin of 1.9 with an AST ALT of 128 and 86 respectively and underwent a right upper quadrant ultrasound which showed a heterogeneous abnormality in the right hepatic lobe measuring 4.1 x 4.1 x 3.5 cm. He was then seen by Dr. Allen Norris and underwent MRI liver with and without contrast which showed a 3.8 cm enhancing lesion in segment 6 compatible with [place] lie RADS 5. He also underwent work-up for abnormal LFTs and was found to have hepatitis C and will be starting treatment for that soon. He has been referred to [place] for further evaluation of liver mass.  Results of blood work from 01/13/2020 reveal a normal albumin level of 3.5. AST and ALT mildly elevated at 70 and 51 respectively with a total bilirubin of 1.6. PT/INR was normal. Platelet count was mildly low at 105. CT chest with contrast did not reveal any evidence of metastatic disease   Interval history-no acute changes since last visit.  Patient continues to have about 2-4 beers daily.  ECOG PS- 1 Pain scale- 0   Review of systems- Review of Systems  Constitutional: Positive for malaise/fatigue. Negative for chills, fever and weight loss.  HENT: Negative for congestion, ear discharge and nosebleeds.   Eyes: Negative for  blurred vision.  Respiratory: Negative for cough, hemoptysis, sputum production, shortness of breath and wheezing.   Cardiovascular: Negative for chest pain, palpitations, orthopnea and claudication.  Gastrointestinal: Negative for abdominal pain, blood in stool, constipation, diarrhea, heartburn, melena, nausea and vomiting.  Genitourinary: Negative for dysuria, flank pain, frequency, hematuria and urgency.  Musculoskeletal: Negative for back pain, joint pain and myalgias.  Skin: Negative for rash.  Neurological: Negative for dizziness, tingling, focal weakness, seizures, weakness and headaches.  Endo/Heme/Allergies: Does not bruise/bleed easily.  Psychiatric/Behavioral: Negative for depression and suicidal ideas. The patient does not have insomnia.       Allergies  Allergen Reactions  . Amlodipine Swelling     Past Medical History:  Diagnosis Date  . Cancer, hepatocellular ([place])   . Diabetes mellitus without complication ([place])   . Hepatitis C   . Hypertension   . Psoriasis   . Stroke [facility])    Hx stroke in 2020  . Substance abuse [facility])      Past Surgical History:  Procedure Laterality Date  . IR RADIOLOGIST EVAL & MGMT  01/19/2020  . IR RADIOLOGIST EVAL & MGMT  01/31/2020  . IR RADIOLOGIST EVAL & MGMT  04/12/2020  . KNEE SURGERY Right     Social History   Socioeconomic History  . Marital status: Widowed    Spouse name: Not on file  . Number of children: Not on file  . Years of education: Not on file  . Highest education level: Not on file  Occupational History  . Occupation: unemployed  Tobacco Use  . Smoking status: Current Every Day  Smoker    Packs/day: 0.25    Years: 38.00    Pack years: 9.50    Types: Cigarettes  . Smokeless tobacco: Never Used  Vaping Use  . Vaping Use: Never used  Substance and Sexual Activity  . Alcohol use: Yes    Comment: 15 to 20 beers daily  . Drug use: Yes    Types: Cocaine, Marijuana    Comment: reports marijuana use once  per month and cocaine use 2-3 times per month  . Sexual activity: Not on file  Other Topics Concern  . Not on file  Social History Narrative  . Not on file   Social Determinants of Health   Financial Resource Strain: Not on file  Food Insecurity: Not on file  Transportation Needs: No Transportation Needs  . Lack of Transportation (Medical): No  . Lack of Transportation (Non-Medical): No  Physical Activity: Not on file  Stress: Stress Concern Present  . Feeling of Stress : To some extent  Social Connections: Socially Isolated  . Frequency of Communication with Friends and Family: Never  . Frequency of Social Gatherings with Friends and Family: More than three times a week  . Attends Religious Services: Never  . Active Member of Clubs or Organizations: No  . Attends Archivist Meetings: Never  . Marital Status: Divorced  Human resources officer Violence: Not on file    Family History  Problem Relation Age of Onset  . Colon cancer Father   . Diabetes Father   . Diabetes Mother   . Kidney disease Maternal Grandmother   . Lung cancer Maternal Grandfather   . Hyperlipidemia Paternal Grandmother   . COPD Paternal Grandfather      Current Outpatient Medications:  .  aspirin 325 MG tablet, Take 1 tablet (325 mg total) by mouth daily., Disp: 120 tablet, Rfl: 0 .  blood glucose meter kit and supplies KIT, Dispense based on patient and insurance preference. Use up to four times daily as directed. (FOR ICD-9 250.00, 250.01)., Disp: 1 each, Rfl: 0 .  gabapentin (NEURONTIN) 100 MG capsule, Take 1 capsule (100 mg total) by mouth every evening., Disp: 30 capsule, Rfl: 1 .  losartan (COZAAR) 50 MG tablet, Take 1 tablet (50 mg total) by mouth daily., Disp: 90 tablet, Rfl: 2 .  metFORMIN (GLUCOPHAGE) 1000 MG tablet, Take 1 tablet (1,000 mg total) by mouth 2 (two) times daily with a meal., Disp: 60 tablet, Rfl: 2 .  nicotine (NICODERM CQ - DOSED IN MG/24 HOURS) 14 mg/24hr patch, Place 1  patch (14 mg total) onto the skin daily., Disp: 28 patch, Rfl: 1  Physical exam:  Vitals:   08/20/20 1407  BP: (!) 153/81  Pulse: 87  Temp: 98.5 F (36.9 C)  TempSrc: Tympanic  SpO2: 98%  Weight: 223 lb (101.2 kg)   Physical Exam Constitutional:      General: He is not in acute distress. Cardiovascular:     Rate and Rhythm: Normal rate and regular rhythm.     Heart sounds: Normal heart sounds.  Pulmonary:     Effort: Pulmonary effort is normal.     Breath sounds: Normal breath sounds.  Abdominal:     General: Bowel sounds are normal.     Palpations: Abdomen is soft.  Skin:    General: Skin is warm and dry.  Neurological:     Mental Status: He is alert and oriented to person, place, and time.      CMP Latest Ref Rng &  Units 08/20/2020  Glucose 70 - 99 mg/dL 322(H)  BUN 6 - 20 mg/dL 13  Creatinine 0.61 - 1.24 mg/dL 0.77  Sodium 135 - 145 mmol/L 132(L)  Potassium 3.5 - 5.1 mmol/L 4.6  Chloride 98 - 111 mmol/L 100  CO2 22 - 32 mmol/L 24  Calcium 8.9 - 10.3 mg/dL 9.2  Total Protein 6.5 - 8.1 g/dL 7.0  Total Bilirubin 0.3 - 1.2 mg/dL 1.6(H)  Alkaline Phos 38 - 126 U/L 159(H)  AST 15 - 41 U/L 77(H)  ALT 0 - 44 U/L 70(H)   CBC Latest Ref Rng & Units 08/14/2020  WBC 3.4 - 10.8 x10E3/uL 6.8  Hemoglobin 13.0 - 17.7 g/dL 14.7  Hematocrit 37.5 - 51.0 % 41.8  Platelets 150 - 450 x10E3/uL 104(L)    No images are attached to the encounter.  MR Abdomen W Wo Contrast  Result Date: 08/17/2020 CLINICAL DATA:  [place] diagnosed 6 months prior.  Hepatitis C. EXAM: MRI ABDOMEN WITHOUT AND WITH CONTRAST TECHNIQUE: Multiplanar multisequence MR imaging of the abdomen was performed both before and after the administration of intravenous contrast. CONTRAST:  46mL GADAVIST GADOBUTROL 1 MMOL/ML IV SOLN COMPARISON:  CT 05/18/2020, MR 12/30/2019 FINDINGS: Lower chest:  Lung bases are clear. Hepatobiliary: Lesion in the RIGHT hepatic lobe demonstrates no post enhancement and measures 3.2 x 3.5 cm  (image 27/series 15) compared to 3.8 x 3.2 cm on prior. Visually the lesion appears near identical. Lesion demonstrates washout of contrast on the 45 second delayed series and a small pseudo capsule on more delayed imaging (image 29/series 19). Elsewhere in liver, no enhancing lesions are present. There is delayed linear enhancement consistent with fibrosis. Caudate lobe is enlarged.  Liver has a nodular contour. Portal veins are patent.  No ascites Pancreas: Normal pancreatic parenchymal intensity. No ductal dilatation or inflammation. Spleen: Normal spleen. Adrenals/urinary tract: Adrenal glands and kidneys are normal. Stomach/Bowel: Stomach and limited of the small bowel is unremarkable Vascular/Lymphatic: Abdominal aortic normal caliber. No retroperitoneal periportal lymphadenopathy. Musculoskeletal: No aggressive osseous lesion IMPRESSION: 1. No significant interval change in enhancing RIGHT hepatic lobe lesion consistent with hepatocellular carcinoma. 2. No new foci of hepatocellular carcinoma the liver. 3. 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 4. Portal veins are patent.  No ascites. Electronically Signed   By: Suzy Bouchard M.D.   On: 08/17/2020 10:41     Assessment and plan- Patient is a 55 y.o. male with nonmetastatic hepatocellular carcinoma and a solitary lesion measuring 3.2 x 3.5 cm in the right hepatic lobe here to discuss MRI results and further management  There has not been a significant change in the size of his right hepatic lobe lesion as compared to October 2021.  However this is a lie RADS 5 lesion and diagnostic of [place].  He is not a candidate for transplant or resection.  Thermal ablation would also not be possible given the size of the lesion.  Bland embolization is possible but it would depend on his LFTs and bilirubin.  Previously was turned down for this procedure by interventional radiology due to his high bilirubin.  Presently his liver  numbers have improved and I did reach out to interventional radiology to reconsider him for this procedure.  They will be meeting with him next week.  Follow-up with me to be decided based on that.  I have again encouraged the patient to cut down on his alcohol intake to be eligible for liver directed therapy.  If he  is not a candidate for that we may have to consider giving him systemic treatment   Visit Diagnosis 1. Hepatocellular carcinoma ([place])   2. Abnormal LFTs   3. Alcohol abuse      Dr. Randa Evens, MD, MPH [facility] [phone] 08/20/2020 5:05 PM

## 2020-08-23 ENCOUNTER — Other Ambulatory Visit: Payer: Self-pay

## 2020-08-23 ENCOUNTER — Encounter: Payer: Self-pay | Admitting: Urology

## 2020-08-23 ENCOUNTER — Ambulatory Visit (INDEPENDENT_AMBULATORY_CARE_PROVIDER_SITE_OTHER): Payer: Medicaid Other | Admitting: Urology

## 2020-08-23 VITALS — BP 159/79 | HR 81 | Ht 72.28 in | Wt 223.0 lb

## 2020-08-23 DIAGNOSIS — A630 Anogenital (venereal) warts: Secondary | ICD-10-CM

## 2020-08-23 DIAGNOSIS — N390 Urinary tract infection, site not specified: Secondary | ICD-10-CM

## 2020-08-23 DIAGNOSIS — R369 Urethral discharge, unspecified: Secondary | ICD-10-CM

## 2020-08-23 DIAGNOSIS — N138 Other obstructive and reflux uropathy: Secondary | ICD-10-CM

## 2020-08-23 DIAGNOSIS — N401 Enlarged prostate with lower urinary tract symptoms: Secondary | ICD-10-CM | POA: Diagnosis not present

## 2020-08-23 LAB — URINALYSIS, COMPLETE
Bilirubin, UA: NEGATIVE
Ketones, UA: NEGATIVE
Leukocytes,UA: NEGATIVE
Nitrite, UA: NEGATIVE
Protein,UA: NEGATIVE
RBC, UA: NEGATIVE
Specific Gravity, UA: 1.02 (ref 1.005–1.030)
Urobilinogen, Ur: >8 mg/dL — ABNORMAL HIGH (ref 0.2–1.0)
pH, UA: 7 (ref 5.0–7.5)

## 2020-08-23 LAB — BLADDER SCAN AMB NON-IMAGING: Scan Result: 70

## 2020-08-23 LAB — MICROSCOPIC EXAMINATION: Bacteria, UA: NONE SEEN

## 2020-08-23 MED ORDER — TAMSULOSIN HCL 0.4 MG PO CAPS: 0.4000 mg | ORAL_CAPSULE | Freq: Every day | ORAL | 11 refills | Status: DC

## 2020-08-23 NOTE — Progress Notes (Signed)
08/23/2020 9:28 AM   Johnny Kelley 1965/12/07 [id number]  Referring provider: Charlynne Cousins, MD [address] [place],  [place] [address]  Chief Complaint  Patient presents with  . Recurrent UTI    HPI: 55 year old male presents today for further evaluation of recurrent urinary tract infections.  He was seen and evaluated by his primary care physician on 08/14/2020.  He reported that he was having some intermittent penile discharge.  He was diagnosed with "recurrent UTI" however there is no evidence on urinalysis to support this.  He was not tested for STIs.  He has had some sticky white purulent type discharge from his penis when he wakes up in the morning for at least a year.  He denies any splaying of his urinary stream.  Denies any blood in his urine with meatus.  He has not been sexually active for 5 or 6 years but denies any history of STIs.    Most recent PSA 0.2 on 10/2019.  He does have multiple medical comorbidities including a history of poorly controlled diabetes, substance abuse, hepatocellular carcinoma.  He did have a series of blood sugars 3 months ago in the 500 range.  More recently, it is 229.  No recent hemoglobin A1c.  Today, he reports primarily obstructive urinary symptoms.  He has a weak stream and difficulty emptying his bladder.  He is currently on on no BPH medications.  IPSS as below.  At the time of examination, he mentioned that he has significant genital wart problem.  He is never sought treatment for this.  Its been progressing for many years.  He denies   IPSS    Row Name 08/23/20 0900         International Prostate Symptom Score   How often have you had the sensation of not emptying your bladder? Almost always     How often have you had to urinate less than every two hours? More than half the time     How often have you found you stopped and started again several times when you urinated? More than half the time     How often have you found it  difficult to postpone urination? Almost always     How often have you had a weak urinary stream? Less than half the time     How often have you had to strain to start urination? Almost always     How many times did you typically get up at night to urinate? 4 Times     Total IPSS Score 29           Quality of Life due to urinary symptoms   If you were to spend the rest of your life with your urinary condition just the way it is now how would you feel about that? Unhappy            Score:  1-7 Mild 8-19 Moderate 20-35 Severe   PMH: Past Medical History:  Diagnosis Date  . Cancer, hepatocellular ([place])   . Diabetes mellitus without complication ([place])   . Hepatitis C   . Hypertension   . Psoriasis   . Stroke [facility])    Hx stroke in 2020  . Substance abuse [facility])     Surgical History: Past Surgical History:  Procedure Laterality Date  . IR RADIOLOGIST EVAL & MGMT  01/19/2020  . IR RADIOLOGIST EVAL & MGMT  01/31/2020  . IR RADIOLOGIST EVAL & MGMT  04/12/2020  . KNEE  SURGERY Right     Home Medications:  Allergies as of 08/23/2020      Reactions   Amlodipine Swelling      Medication List       Accurate as of Aug 23, 2020  9:28 AM. If you have any questions, ask your nurse or doctor.        aspirin 325 MG tablet Take 1 tablet (325 mg total) by mouth daily.   blood glucose meter kit and supplies Kit Dispense based on patient and insurance preference. Use up to four times daily as directed. (FOR ICD-9 250.00, 250.01).   gabapentin 100 MG capsule Commonly known as: NEURONTIN Take 1 capsule (100 mg total) by mouth every evening.   losartan 50 MG tablet Commonly known as: COZAAR Take 1 tablet (50 mg total) by mouth daily.   metFORMIN 1000 MG tablet Commonly known as: GLUCOPHAGE Take 1 tablet (1,000 mg total) by mouth 2 (two) times daily with a meal.   nicotine 14 mg/24hr patch Commonly known as: NICODERM CQ - dosed in mg/24 hours Place 1 patch (14 mg total) onto  the skin daily.       Allergies:  Allergies  Allergen Reactions  . Amlodipine Swelling    Family History: Family History  Problem Relation Age of Onset  . Colon cancer Father   . Diabetes Father   . Diabetes Mother   . Kidney disease Maternal Grandmother   . Lung cancer Maternal Grandfather   . Hyperlipidemia Paternal Grandmother   . COPD Paternal Grandfather     Social History:  reports that he has been smoking cigarettes. He has a 9.50 pack-year smoking history. He has never used smokeless tobacco. He reports current alcohol use. He reports current drug use. Drugs: Cocaine and Marijuana.   Physical Exam: BP (!) 159/79   Pulse 81   Ht 6' 0.28" (1.836 m)   Wt 223 lb (101.2 kg)   BMI 30.01 kg/m   Constitutional:  Alert and oriented, No acute distress. HEENT: [place] AT, moist mucus membranes.  Trachea midline, no masses. Cardiovascular: No clubbing, cyanosis, or edema. Respiratory: Normal respiratory effort, no increased work of breathing. DRE: Normal sphincter tone, minimally large prostate, 40 cc, nontender no nodules GU: Extensive penile and suprapubic condyloma measuring at least 15 cm cauliflower type appearance extending in sheetlike mass circumferentially involving at least 60% of penile shaft skin with distal penile shaft and glandular sparing.  Slight erythema of the glans without any expressible discharge, and patent meatus. Skin: No rashes, bruises or suspicious lesions. Neurologic: Grossly intact, no focal deficits, moving all 4 extremities. Psychiatric: Normal mood and affect.  Laboratory Data: Lab Results  Component Value Date   WBC 6.8 08/14/2020   HGB 14.7 08/14/2020   HCT 41.8 08/14/2020   MCV 93 08/14/2020   PLT 104 (L) 08/14/2020    Lab Results  Component Value Date   CREATININE 0.77 08/20/2020    Lab Results  Component Value Date   HGBA1C 7.2 (H) 08/14/2020    Urinalysis Negative, see Epic  Results for orders placed or performed in visit  on 08/23/20  Bladder Scan (Post Void Residual) in office  Result Value Ref Range   Scan Result 70      Assessment & Plan:    1. Genital warts Extensive bulky suprapubic and penile condyloma with some surrounding inflammatory change  Given the extent of the the warty growth, along with surrounding inflammation, I recommended strong consideration of treatment for this.  We will refer him to Dr. Alphonzo Severance, reconstructive urologist at [facility] for further evaluation.  - Ambulatory referral to Urology  2. Penile discharge May be inflammatory related, recommend STI testing despite no recent sexual activity  If this is negative and he continues to have discharge, especially in setting of extensive penile warts, would consider cystoscopy to rule out penile or urethral malignancy. - Urinalysis, Complete - Bladder Scan (Post Void Residual) in office - Mycoplasma / Ureaplasma Culture - GC/Chlamydia Probe Amp  3. BPH with urinary obstruction Trial of Flomax 0.4 mg, discussed possible side effects  Reassess urinary symptoms in 3 months.   Hollice Espy, MD  [facility] [address], [place], [place] [address] [phone]

## 2020-08-25 ENCOUNTER — Encounter: Payer: Self-pay | Admitting: Dermatology

## 2020-08-25 LAB — GC/CHLAMYDIA PROBE AMP
Chlamydia trachomatis, NAA: NEGATIVE
Neisseria Gonorrhoeae by PCR: NEGATIVE

## 2020-08-28 ENCOUNTER — Other Ambulatory Visit: Payer: Medicaid Other

## 2020-08-28 ENCOUNTER — Other Ambulatory Visit: Payer: Self-pay

## 2020-08-28 DIAGNOSIS — R350 Frequency of micturition: Secondary | ICD-10-CM

## 2020-08-28 DIAGNOSIS — Z1211 Encounter for screening for malignant neoplasm of colon: Secondary | ICD-10-CM | POA: Diagnosis not present

## 2020-08-28 DIAGNOSIS — N390 Urinary tract infection, site not specified: Secondary | ICD-10-CM | POA: Diagnosis not present

## 2020-08-28 DIAGNOSIS — I10 Essential (primary) hypertension: Secondary | ICD-10-CM | POA: Diagnosis not present

## 2020-08-28 DIAGNOSIS — I639 Cerebral infarction, unspecified: Secondary | ICD-10-CM | POA: Diagnosis not present

## 2020-08-28 DIAGNOSIS — K769 Liver disease, unspecified: Secondary | ICD-10-CM

## 2020-08-28 DIAGNOSIS — E1140 Type 2 diabetes mellitus with diabetic neuropathy, unspecified: Secondary | ICD-10-CM

## 2020-08-28 LAB — MICROALBUMIN, URINE WAIVED
Creatinine, Urine Waived: 100 mg/dL (ref 10–300)
Microalb, Ur Waived: 10 mg/L (ref 0–19)
Microalb/Creat Ratio: <30 mg/g (ref <30)

## 2020-08-28 LAB — BAYER DCA HB A1C WAIVED: HB A1C (BAYER DCA - WAIVED): 7.1 % — ABNORMAL HIGH (ref <7.0)

## 2020-08-29 LAB — PSA TOTAL+% FREE (SERIAL)
PSA, Free Pct: 50 %
PSA, Free: 0.05 ng/mL
Prostate Specific Ag, Serum: 0.1 ng/mL (ref 0.0–4.0)

## 2020-08-29 LAB — CBC WITH DIFFERENTIAL/PLATELET
Basophils Absolute: 0.1 10*3/uL (ref 0.0–0.2)
Basos: 1 %
EOS (ABSOLUTE): 0.2 10*3/uL (ref 0.0–0.4)
Eos: 4 %
Hematocrit: 42.8 % (ref 37.5–51.0)
Hemoglobin: 14.5 g/dL (ref 13.0–17.7)
Immature Grans (Abs): 0 10*3/uL (ref 0.0–0.1)
Immature Granulocytes: 0 %
Lymphocytes Absolute: 1.6 10*3/uL (ref 0.7–3.1)
Lymphs: 27 %
MCH: 32.9 pg (ref 26.6–33.0)
MCHC: 33.9 g/dL (ref 31.5–35.7)
MCV: 97 fL (ref 79–97)
Monocytes Absolute: 0.7 10*3/uL (ref 0.1–0.9)
Monocytes: 13 %
Neutrophils Absolute: 3.2 10*3/uL (ref 1.4–7.0)
Neutrophils: 55 %
Platelets: 94 10*3/uL — CL (ref 150–450)
RBC: 4.41 x10E6/uL (ref 4.14–5.80)
RDW: 12.6 % (ref 11.6–15.4)
WBC: 5.8 10*3/uL (ref 3.4–10.8)

## 2020-08-29 LAB — COMPREHENSIVE METABOLIC PANEL
ALT: 73 IU/L — ABNORMAL HIGH (ref 0–44)
AST: 63 IU/L — ABNORMAL HIGH (ref 0–40)
Albumin/Globulin Ratio: 0.9 — ABNORMAL LOW (ref 1.2–2.2)
Albumin: 3.4 g/dL — ABNORMAL LOW (ref 3.8–4.9)
Alkaline Phosphatase: 175 IU/L — ABNORMAL HIGH (ref 44–121)
BUN/Creatinine Ratio: 16 (ref 9–20)
BUN: 12 mg/dL (ref 6–24)
Bilirubin Total: 1.2 mg/dL (ref 0.0–1.2)
CO2: 22 mmol/L (ref 20–29)
Calcium: 9.2 mg/dL (ref 8.7–10.2)
Chloride: 99 mmol/L (ref 96–106)
Creatinine, Ser: 0.73 mg/dL — ABNORMAL LOW (ref 0.76–1.27)
Globulin, Total: 3.7 g/dL (ref 1.5–4.5)
Glucose: 284 mg/dL — ABNORMAL HIGH (ref 65–99)
Potassium: 4.9 mmol/L (ref 3.5–5.2)
Sodium: 134 mmol/L (ref 134–144)
Total Protein: 7.1 g/dL (ref 6.0–8.5)
eGFR: 106 mL/min/{1.73_m2} (ref >59)

## 2020-08-29 LAB — THYROID PANEL WITH TSH
Free Thyroxine Index: >6.4 — ABNORMAL HIGH (ref 1.2–4.9)
T3 Uptake Ratio: >56 % — ABNORMAL HIGH (ref 24–39)
T4, Total: 11.5 ug/dL (ref 4.5–12.0)
TSH: 3.32 u[IU]/mL (ref 0.450–4.500)

## 2020-08-29 LAB — LIPID PANEL
Chol/HDL Ratio: 2.1 ratio (ref 0.0–5.0)
Cholesterol, Total: 158 mg/dL (ref 100–199)
HDL: 77 mg/dL (ref >39)
LDL Chol Calc (NIH): 66 mg/dL (ref 0–99)
Triglycerides: 79 mg/dL (ref 0–149)
VLDL Cholesterol Cal: 15 mg/dL (ref 5–40)

## 2020-08-29 LAB — MYCOPLASMA / UREAPLASMA CULTURE
Mycoplasma hominis Culture: NEGATIVE
Ureaplasma urealyticum: NEGATIVE

## 2020-08-30 ENCOUNTER — Other Ambulatory Visit: Payer: Self-pay

## 2020-08-30 ENCOUNTER — Ambulatory Visit
Admission: RE | Admit: 2020-08-30 | Discharge: 2020-08-30 | Disposition: A | Discharge status: Home / Self Care | Payer: Medicaid Other | Source: Ambulatory Visit | Attending: Interventional Radiology | Admitting: Interventional Radiology

## 2020-08-30 ENCOUNTER — Encounter: Payer: Self-pay | Admitting: *Deleted

## 2020-08-30 DIAGNOSIS — R7989 Other specified abnormal findings of blood chemistry: Secondary | ICD-10-CM

## 2020-08-30 HISTORY — PX: IR RADIOLOGIST EVAL & MGMT: IMG5224

## 2020-08-30 NOTE — Progress Notes (Signed)
Patient ID: Johnny Kelley, male   DOB: [birth year], 55 y.o.   MRN: [id number]         Chief Complaint: [facility]  Referring Physician(s): Deago Burruss,JohnWohl,Darren(GI) Janese Banks (Oncology) Barry Dienes (Hepatobiliary Surgery)  History of Present Illness:  HRITHIK BOSCHEE a 55 y.o.malewith past medical history significant for diabetes, hypertension,stroke and hepatitis C found to have a indeterminate liver lesion on right upper quadrant abdominal ultrasound performed 10/10/2019 for evaluation of elevated LFTs. Lesion was further evaluated on dedicated abdominal MRI performed 12/30/2019 which demonstrated an approximately 3.8 x 3.2 cm lesion within the right lobe of the liver which demonstrated imaging characteristics compatible with a hepatocellular carcinoma (note, the lesion was foundestimated to measure approximately 4.3 x 3.8 cm on the right upper quadrant abdominal ultrasound.) Subsequentstagingchest CT performed 01/18/2020 was negative for evidence of pulmonary metastatic disease.  The patient was evaluated on 01/31/2020 and deemed a candidate for bland hepatic embolization.  This procedure was planned for 03/26/2020 however was canceled as preprocedure laboratories demonstrated a bilirubin above 2.  The patient has since been evaluated and followed by Dr. Janese Banks and re-referred to interventional radiology following improvement in the patient's bilirubin level.  As previously, the majority of the conversation was held with the patient's brother, Brown Human serves as the primary historian.  Fortunately, the patient states he has been able to significantly reduce his alcohol intake.    He is in his baseline state of health.  No abdominal pain, melena or bloody stools.  No yellowing of the skin or eyes.  No increased abdominal girth.  No change in appetite or energy level.  Past Medical History:  Diagnosis Date  . Cancer, hepatocellular ([place])   . Diabetes mellitus without complication ([place])    . Hepatitis C   . Hypertension   . Psoriasis   . Stroke [facility])    Hx stroke in 2020  . Substance abuse [facility])     Past Surgical History:  Procedure Laterality Date  . IR RADIOLOGIST EVAL & MGMT  01/19/2020  . IR RADIOLOGIST EVAL & MGMT  01/31/2020  . IR RADIOLOGIST EVAL & MGMT  04/12/2020  . KNEE SURGERY Right     Allergies: Amlodipine  Medications: Prior to Admission medications   Medication Sig Start Date End Date Taking? Authorizing Provider  aspirin 325 MG tablet Take 1 tablet (325 mg total) by mouth daily. 02/28/20   Iloabachie, Chioma E, NP  blood glucose meter kit and supplies KIT Dispense based on patient and insurance preference. Use up to four times daily as directed. (FOR ICD-9 250.00, 250.01). 10/27/19   Iloabachie, Chioma E, NP  gabapentin (NEURONTIN) 100 MG capsule Take 1 capsule (100 mg total) by mouth every evening. 08/14/20   Vigg, Avanti, MD  losartan (COZAAR) 50 MG tablet Take 1 tablet (50 mg total) by mouth daily. 08/14/20   Vigg, Avanti, MD  metFORMIN (GLUCOPHAGE) 1000 MG tablet Take 1 tablet (1,000 mg total) by mouth 2 (two) times daily with a meal. 08/14/20   Vigg, Avanti, MD  nicotine (NICODERM CQ - DOSED IN MG/24 HOURS) 14 mg/24hr patch Place 1 patch (14 mg total) onto the skin daily. 08/14/20   Vigg, Avanti, MD  tamsulosin (FLOMAX) 0.4 MG CAPS capsule Take 1 capsule (0.4 mg total) by mouth daily. 08/23/20   Hollice Espy, MD     Family History  Problem Relation Age of Onset  . Colon cancer Father   . Diabetes Father   . Diabetes Mother   . Kidney disease  Maternal Grandmother   . Lung cancer Maternal Grandfather   . Hyperlipidemia Paternal Grandmother   . COPD Paternal Grandfather     Social History   Socioeconomic History  . Marital status: Widowed    Spouse name: Not on file  . Number of children: Not on file  . Years of education: Not on file  . Highest education level: Not on file  Occupational History  . Occupation: unemployed  Tobacco Use   . Smoking status: Current Every Day Smoker    Packs/day: 0.25    Years: 38.00    Pack years: 9.50    Types: Cigarettes  . Smokeless tobacco: Never Used  Vaping Use  . Vaping Use: Never used  Substance and Sexual Activity  . Alcohol use: Yes    Comment: 15 to 20 beers daily  . Drug use: Yes    Types: Cocaine, Marijuana    Comment: reports marijuana use once per month and cocaine use 2-3 times per month  . Sexual activity: Not on file  Other Topics Concern  . Not on file  Social History Narrative  . Not on file   Social Determinants of Health   Financial Resource Strain: Not on file  Food Insecurity: Not on file  Transportation Needs: No Transportation Needs  . Lack of Transportation (Medical): No  . Lack of Transportation (Non-Medical): No  Physical Activity: Not on file  Stress: Stress Concern Present  . Feeling of Stress : To some extent  Social Connections: Socially Isolated  . Frequency of Communication with Friends and Family: Never  . Frequency of Social Gatherings with Friends and Family: More than three times a week  . Attends Religious Services: Never  . Active Member of Clubs or Organizations: No  . Attends Archivist Meetings: Never  . Marital Status: Divorced    ECOG Status: 1 - Symptomatic but completely ambulatory  Review of Systems  Review of Systems: A 12 point ROS discussed and pertinent positives are indicated in the HPI above.  All other systems are negative.  Physical Exam No direct physical exam was performed (except for noted visual exam findings with Video Visits).   Vital Signs: There were no vitals taken for this visit.  Imaging:  Abdominal MRI - 08/16/2020; 12/30/2019.  There is an approximately 4.2 x 3.5 cm heterogeneously enhancing mass within the posterior segment of the right lobe of the liver (7, series 15), slightly increased in size compared to the 12/30/2019 examination, previously, 3.9 x 3.4 cm (by my direct  remeasurement).  The portal venous system appears patent.  No ascites.  No additional discrete hepatic lesions identified.  CT scan abdomen and pelvis - 05/08/2020; 02/22/2020 - conventional hepatic anatomy.  Moderate amount of atherosclerotic plaque within normal caliber abdominal aorta, not filled resulting in a hemodynamic significant stenosis.  MR Abdomen W Wo Contrast  Result Date: 08/17/2020 CLINICAL DATA:  [place] diagnosed 6 months prior.  Hepatitis C. EXAM: MRI ABDOMEN WITHOUT AND WITH CONTRAST TECHNIQUE: Multiplanar multisequence MR imaging of the abdomen was performed both before and after the administration of intravenous contrast. CONTRAST:  38m GADAVIST GADOBUTROL 1 MMOL/ML IV SOLN COMPARISON:  CT 05/18/2020, MR 12/30/2019 FINDINGS: Lower chest:  Lung bases are clear. Hepatobiliary: Lesion in the RIGHT hepatic lobe demonstrates no post enhancement and measures 3.2 x 3.5 cm (image 27/series 15) compared to 3.8 x 3.2 cm on prior. Visually the lesion appears near identical. Lesion demonstrates washout of contrast on the 45 second delayed series  and a small pseudo capsule on more delayed imaging (image 29/series 19). Elsewhere in liver, no enhancing lesions are present. There is delayed linear enhancement consistent with fibrosis. Caudate lobe is enlarged.  Liver has a nodular contour. Portal veins are patent.  No ascites Pancreas: Normal pancreatic parenchymal intensity. No ductal dilatation or inflammation. Spleen: Normal spleen. Adrenals/urinary tract: Adrenal glands and kidneys are normal. Stomach/Bowel: Stomach and limited of the small bowel is unremarkable Vascular/Lymphatic: Abdominal aortic normal caliber. No retroperitoneal periportal lymphadenopathy. Musculoskeletal: No aggressive osseous lesion IMPRESSION: 1. No significant interval change in enhancing RIGHT hepatic lobe lesion consistent with hepatocellular carcinoma. 2. No new foci of hepatocellular carcinoma the liver. 3. Morphologic changes  in liver consistent with cirrhosis with early fibrosis. 4. Portal veins are patent.  No ascites. Electronically Signed   By: Suzy Bouchard M.D.   On: 08/17/2020 10:41    Labs:  CBC: Recent Labs    05/18/20 1539 08/03/20 1417 08/14/20 1546 08/28/20 0818  WBC 7.5 5.6 6.8 5.8  HGB 16.1 14.1 14.7 14.5  HCT 45.4 40.5 41.8 42.8  PLT 103* 87* 104* 94*    COAGS: Recent Labs    10/10/19 0806 01/13/20 1433 03/26/20 1204 08/03/20 1417  INR 1.1 1.1 1.2 1.2  APTT 32  --   --   --     BMP: Recent Labs    10/10/19 0806 01/13/20 1433 05/18/20 1359 05/18/20 1539 08/03/20 1417 08/14/20 1546 08/20/20 1349 08/28/20 0818  NA 131*   < > 127* 129* 136 136 132* 134  K 4.1   < > 4.5 4.4 4.3 4.7 4.6 4.9  CL 101   < > 91* 94* 104 101 100 99  CO2 23   < > _0 19* 24 22  GLUCOSE 302*   < > 515* 495* 257* 229* 322* 284*  BUN 12   < > _1 CALCIUM 8.8*   < > 9.2 9.6 9.2 9.8 9.2 9.2  CREATININE 0.78   < > 0.79 0.85 0.68 0.77 0.77 0.73*  GFRNONAA >60   < > >60 >60 >60  --  >60  --   GFRAA >60  --   --   --   --   --   --   --    < > = values in this interval not displayed.    LIVER FUNCTION TESTS: Recent Labs    05/18/20 1359 08/03/20 1417 08/20/20 1349 08/28/20 0818  BILITOT 3.5* 1.5* 1.6* 1.2  AST 141* 65* 77* 63*  ALT 110* 59* 70* 73*  ALKPHOS 154* 112 159* 175*  PROT 7.2 7.0 7.0 7.1  ALBUMIN 3.1* 3.2* 3.2* 3.4*    TUMOR MARKERS: AFP - 2.6 (2/18)  Assessment and Plan:  Tajay Muzzy Franklinis a 55 y.o.malewith past medical history significant for diabetes, hypertension,stroke and hepatitis C found to have a indeterminate liver lesion on right upper quadrant abdominal ultrasound performed 10/10/2019 for evaluation of elevated LFTs. Lesion was further evaluated on dedicated abdominal MRI performed 12/30/2019 which demonstrated an approximately 3.8 x 3.2 cm lesion within the right lobe of the liver which demonstrated imaging characteristics compatible  with a hepatocellular carcinoma (note, the lesion was foundestimated to measure approximately 4.3 x 3.8 cm on the right upper quadrant abdominal ultrasound.) Subsequentstagingchest CT performed 01/18/2020 was negative for evidence of pulmonary metastatic disease.  The patient was evaluated on 01/31/2020 and deemed a candidate for bland hepatic embolization.  This procedure was planned  for 03/26/2020 however was canceled as preprocedure laboratories demonstrated a bilirubin above 2.  The patient has been evaluated by Dr. Janese Banks and re-referred to interventional radiology following improvement in the patient's bilirubin level.  Following examinations were personally reviewed in detail:  Abdominal MRI - 08/16/2020; 12/30/2019.  There is an approximately 4.2 x 3.5 cm heterogeneously enhancing mass within the posterior segment of the right lobe of the liver (7, series 15), slightly increased in size compared to the 12/30/2019 examination, previously, 3.9 x 3.4 cm (by my direct remeasurement).  The portal venous system appears patent.  No ascites.  No additional discrete hepatic lesions identified.  CT scan abdomen and pelvis - 05/08/2020; 02/22/2020   Conventional hepatic anatomy.  Moderate amount of atherosclerotic plaque within normal caliber abdominal aorta, not filled resulting in a hemodynamic significant stenosis.  Again, portal venous system appears widely patent.  Fortunately, the patient's bilirubin has remained within an acceptable limit for consideration of bland embolization (less than 2) as it was 1.5 on 5/6, 1.6 on 5/23 and 1.2 on 5/31.  Given slight increase in size of the liver lesion, as well as the patient's relative stability of the bilirubin, I feel it is reasonable to reschedule the patient for attempted bland embolization.  As such, prolonged conversations were again held with the patient regarding the benefits and risks of bland embolization including but not limited to nontarget  embolization, worsening liver function, vessel injury/bleeding, contrast and radiation exposure.  I again explained the procedure is performed at [facility].  The procedure entails an overnight admission observation and PCA usage.    I again explained that will the embolization is NOT curative ti will be performed in hopes of reducing the size of the liver lesion potentially to less than 3 cm thereby potentially rendering the patient a possible candidate for percutaneous microwave ablation, a potentially curative treatment modality.  Following this prolonged and detailed conversation, the patient again wishes to pursue attempted bland embolization.    Again, the patient was encouraged to reduce and/or eliminate alcohol intake, especially prior to the procedure and during his immediate postprocedural recovery.  PLAN: - Re-schedule patient for bland hepatic embolization at [facility].  The procedure will entail on overnight admission for continued observation and PCA usage.  An AFP level will be obtained on the morning of the procedure to help assist with future surveillance purposes.  A copy of this report was sent to the requesting provider on this date.  Electronically Signed: Sandi Mariscal 08/30/2020, 9:34 AM   I spent a total of 15 Minutes in remote  clinical consultation, greater than 50% of which was counseling/coordinating care for [facility].    Visit type: Audio only (telephone). Audio (no video) only due to patient's lack of internet/smartphone capability. Alternative for in-person consultation at [facility], [address] [place], [place], [place]. This visit type was conducted due to national recommendations for restrictions regarding the COVID-19 Pandemic (e.g. social distancing).  This format is felt to be most appropriate for this patient at this time.  All issues noted in this document were discussed and addressed.

## 2020-09-04 ENCOUNTER — Other Ambulatory Visit: Payer: Self-pay

## 2020-09-04 ENCOUNTER — Encounter: Payer: Self-pay | Admitting: Internal Medicine

## 2020-09-04 ENCOUNTER — Ambulatory Visit
Admission: RE | Admit: 2020-09-04 | Discharge: 2020-09-04 | Disposition: A | Discharge status: Home / Self Care | Payer: Medicaid Other | Attending: Internal Medicine | Admitting: Internal Medicine

## 2020-09-04 ENCOUNTER — Ambulatory Visit
Admission: RE | Admit: 2020-09-04 | Discharge: 2020-09-04 | Disposition: A | Discharge status: Home / Self Care | Payer: Medicaid Other | Source: Ambulatory Visit | Attending: Internal Medicine | Admitting: Internal Medicine

## 2020-09-04 ENCOUNTER — Other Ambulatory Visit: Payer: Self-pay | Admitting: Internal Medicine

## 2020-09-04 ENCOUNTER — Ambulatory Visit (INDEPENDENT_AMBULATORY_CARE_PROVIDER_SITE_OTHER): Payer: Medicaid Other | Admitting: Internal Medicine

## 2020-09-04 VITALS — BP 141/83 | HR 90 | Temp 98.5°F | Ht 72.03 in | Wt 213.6 lb

## 2020-09-04 DIAGNOSIS — C220 Liver cell carcinoma: Secondary | ICD-10-CM | POA: Diagnosis not present

## 2020-09-04 DIAGNOSIS — M545 Low back pain, unspecified: Secondary | ICD-10-CM | POA: Diagnosis not present

## 2020-09-04 DIAGNOSIS — G8929 Other chronic pain: Secondary | ICD-10-CM

## 2020-09-04 DIAGNOSIS — I10 Essential (primary) hypertension: Secondary | ICD-10-CM | POA: Diagnosis not present

## 2020-09-04 DIAGNOSIS — E1140 Type 2 diabetes mellitus with diabetic neuropathy, unspecified: Secondary | ICD-10-CM

## 2020-09-04 MED ORDER — LIDOCAINE 5 % EX PTCH: 1.0000 | MEDICATED_PATCH | CUTANEOUS | 3 refills | Status: DC

## 2020-09-04 MED ORDER — EMPAGLIFLOZIN 10 MG PO TABS: 10.0000 mg | ORAL_TABLET | Freq: Every day | ORAL | 2 refills | Status: DC

## 2020-09-04 MED ORDER — GABAPENTIN 100 MG PO CAPS: 100.0000 mg | ORAL_CAPSULE | Freq: Every evening | ORAL | 3 refills | Status: DC

## 2020-09-04 MED ORDER — NICOTINE 14 MG/24HR TD PT24: 14.0000 mg | MEDICATED_PATCH | Freq: Every day | TRANSDERMAL | 1 refills | Status: DC
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
## 2020-09-04 NOTE — Telephone Encounter (Signed)
  Notes to clinic:   : Alternative Requested:PRIOR AUTH.  Requested Prescriptions  Pending Prescriptions Disp Refills   lidocaine (LIDODERM) 5 % [Pharmacy Med Name: LIDOCAINE 5% PATCH] 30 patch 3    Sig: Place 1 patch onto the skin daily. Remove & Discard patch within 12 hours or as directed by MD      Analgesics:  Topicals Passed - 09/04/2020 10:50 AM      Passed - Valid encounter within last 12 months    Recent Outpatient Visits           Today Hepatocellular carcinoma ([place])   [facility] Vigg, Avanti, MD   3 weeks ago Screen for colon cancer   [place] Vigg, Avanti, MD       Future Appointments             In 2 weeks Vigg, Avanti, MD [facility], [place]   In 2 months Hollice Espy, MD [place]

## 2020-09-04 NOTE — Telephone Encounter (Signed)
Seen today scheduled 6/21

## 2020-09-06 ENCOUNTER — Other Ambulatory Visit (HOSPITAL_COMMUNITY): Payer: Self-pay | Admitting: Interventional Radiology

## 2020-09-06 ENCOUNTER — Telehealth: Payer: Self-pay | Admitting: *Deleted

## 2020-09-06 DIAGNOSIS — K769 Liver disease, unspecified: Secondary | ICD-10-CM

## 2020-09-06 NOTE — Telephone Encounter (Signed)
Hi Dr Neomia Dear pt.'s brother came in to let you know his brother's appt.s  Was confirmed with the referrals made to ortho,urinary surg., gastro, nuero & dermatology. Just FYI

## 2020-09-07 DIAGNOSIS — M25511 Pain in right shoulder: Secondary | ICD-10-CM | POA: Diagnosis not present

## 2020-09-07 DIAGNOSIS — M545 Low back pain, unspecified: Secondary | ICD-10-CM | POA: Diagnosis not present

## 2020-09-07 DIAGNOSIS — M5412 Radiculopathy, cervical region: Secondary | ICD-10-CM | POA: Diagnosis not present

## 2020-09-07 DIAGNOSIS — G737 Myopathy in diseases classified elsewhere: Secondary | ICD-10-CM | POA: Diagnosis not present

## 2020-09-11 ENCOUNTER — Other Ambulatory Visit: Payer: Self-pay | Admitting: *Deleted

## 2020-09-11 DIAGNOSIS — C220 Liver cell carcinoma: Secondary | ICD-10-CM

## 2020-09-12 DIAGNOSIS — M545 Low back pain, unspecified: Secondary | ICD-10-CM | POA: Diagnosis not present

## 2020-09-17 ENCOUNTER — Telehealth (INDEPENDENT_AMBULATORY_CARE_PROVIDER_SITE_OTHER): Payer: Medicaid Other | Admitting: Gastroenterology

## 2020-09-17 DIAGNOSIS — Z1211 Encounter for screening for malignant neoplasm of colon: Secondary | ICD-10-CM

## 2020-09-17 MED ORDER — NA SULFATE-K SULFATE-MG SULF 17.5-3.13-1.6 GM/177ML PO SOLN: 1.0000 | Freq: Once | ORAL | 0 refills | Status: AC

## 2020-09-17 NOTE — Progress Notes (Signed)
Gastroenterology Pre-Procedure Review  Request Date: 11/06/20 Requesting Physician: Dr. Allen Norris  PATIENT REVIEW QUESTIONS: The patient responded to the following health history questions as indicated:    1. Are you having any GI issues? no 2. Do you have a personal history of Polyps? no 3. Do you have a family history of Colon Cancer or Polyps? yes (father-colon cancer, brother-colon polyps) 4. Diabetes Mellitus? yes (Type 2) 5. Joint replacements in the past 12 months?no 6. Major health problems in the past 3 months?yes (Liver embo scan) 7. Any artificial heart valves, MVP, or defibrillator?no    MEDICATIONS & ALLERGIES:    Patient reports the following regarding taking any anticoagulation/antiplatelet therapy:   Plavix, Coumadin, Eliquis, Xarelto, Lovenox, Pradaxa, Brilinta, or Effient? no Aspirin? yes (aspirin 325 mg)  Patient confirms/reports the following medications:  Current Outpatient Medications  Medication Sig Dispense Refill   aspirin 325 MG tablet Take 1 tablet (325 mg total) by mouth daily. 120 tablet 0   blood glucose meter kit and supplies KIT Dispense based on patient and insurance preference. Use up to four times daily as directed. (FOR ICD-9 250.00, 250.01). 1 each 0   empagliflozin (JARDIANCE) 10 MG TABS tablet Take 1 tablet (10 mg total) by mouth daily before breakfast. 30 tablet 2   gabapentin (NEURONTIN) 100 MG capsule Take 1 capsule (100 mg total) by mouth every evening. 30 capsule 3   lidocaine (LIDODERM) 5 % Place 1 patch onto the skin daily. Remove & Discard patch within 12 hours or as directed by MD 30 patch 3   losartan (COZAAR) 50 MG tablet Take 1 tablet (50 mg total) by mouth daily. 90 tablet 2   metFORMIN (GLUCOPHAGE) 1000 MG tablet Take 1 tablet (1,000 mg total) by mouth 2 (two) times daily with a meal. 60 tablet 2   nicotine (NICODERM CQ - DOSED IN MG/24 HOURS) 14 mg/24hr patch Place 1 patch (14 mg total) onto the skin daily. 28 patch 1   tamsulosin  (FLOMAX) 0.4 MG CAPS capsule Take 1 capsule (0.4 mg total) by mouth daily. 30 capsule 11   No current facility-administered medications for this visit.    Patient confirms/reports the following allergies:  Allergies  Allergen Reactions   Amlodipine Swelling    No orders of the defined types were placed in this encounter.   AUTHORIZATION INFORMATION Primary Insurance: 1D#: Group #:  Secondary Insurance: 1D#: Group #:  SCHEDULE INFORMATION: Date: 11/06/20 Time: Location: [place]

## 2020-09-18 ENCOUNTER — Other Ambulatory Visit (HOSPITAL_COMMUNITY): Payer: Medicaid Other

## 2020-09-18 ENCOUNTER — Ambulatory Visit (INDEPENDENT_AMBULATORY_CARE_PROVIDER_SITE_OTHER): Payer: Medicaid Other | Admitting: Internal Medicine

## 2020-09-18 ENCOUNTER — Other Ambulatory Visit: Payer: Self-pay

## 2020-09-18 ENCOUNTER — Encounter: Payer: Self-pay | Admitting: Internal Medicine

## 2020-09-18 ENCOUNTER — Other Ambulatory Visit
Admission: RE | Admit: 2020-09-18 | Discharge: 2020-09-18 | Disposition: A | Discharge status: Home / Self Care | Payer: Medicaid Other | Source: Ambulatory Visit | Attending: Internal Medicine | Admitting: Internal Medicine

## 2020-09-18 DIAGNOSIS — E7800 Pure hypercholesterolemia, unspecified: Secondary | ICD-10-CM

## 2020-09-18 DIAGNOSIS — I10 Essential (primary) hypertension: Secondary | ICD-10-CM

## 2020-09-18 DIAGNOSIS — Z20822 Contact with and (suspected) exposure to covid-19: Secondary | ICD-10-CM | POA: Diagnosis not present

## 2020-09-18 DIAGNOSIS — Z01812 Encounter for preprocedural laboratory examination: Secondary | ICD-10-CM | POA: Insufficient documentation

## 2020-09-18 DIAGNOSIS — K769 Liver disease, unspecified: Secondary | ICD-10-CM

## 2020-09-18 DIAGNOSIS — C220 Liver cell carcinoma: Secondary | ICD-10-CM

## 2020-09-18 LAB — SARS CORONAVIRUS 2 (TAT 6-24 HRS): SARS Coronavirus 2: NEGATIVE

## 2020-09-18 MED ORDER — FAMOTIDINE 20 MG PO TABS: 20.0000 mg | ORAL_TABLET | Freq: Every day | ORAL | 3 refills | Status: DC

## 2020-09-18 NOTE — Progress Notes (Signed)
There were no vitals taken for this visit.   Subjective:    Patient ID: Johnny Kelley, male    DOB: Apr 21, 1965, 55 y.o.   MRN: [id number]  Chief Complaint  Patient presents with   Heartburn    Heart burn has been bad for the past several weeks, OTC meds not working   lab review    HPI: Johnny Kelley is a 55 y.o. male   This visit was completed via video visit through MyChart due to the restrictions of the COVID-19 pandemic. All issues as above were discussed and addressed. Physical exam was done as above through visual confirmation on video through MyChart. If it was felt that the patient should be evaluated in the office, they were directed there. The patient verbally consented to this visit. Location of the patient: home Location of the provider: work Those involved with this call:  Provider: Charlynne Cousins, MD CMA: Frazier Butt, [place] Desk/Registration: Jill Side  Time spent on call: 10 minutes with patient face to face via video conference. More than 50% of this time was spent in counseling and coordination of care. 10 minutes total spent in review of patient's record and preparation of their chart.    Heartburn He complains of heartburn. He reports no abdominal pain, no belching, no chest pain, no choking, no coughing, no dysphagia, no early satiety, no globus sensation, no hoarse voice, no nausea, no sore throat, no stridor, no tooth decay, no water brash or no wheezing. This is a new problem. Pertinent negatives include no fatigue.  Diabetes He presents for his follow-up diabetic visit. He has type 2 diabetes mellitus. His disease course has been improving. Pertinent negatives for diabetes include no blurred vision, no chest pain, no fatigue, no foot ulcerations, no polydipsia, no polyphagia and no visual change.  Gastroesophageal Reflux He complains of heartburn. He reports no abdominal pain, no belching, no chest pain, no choking, no coughing, no dysphagia,  no early satiety, no globus sensation, no hoarse voice, no nausea, no sore throat, no stridor, no tooth decay, no water brash or no wheezing. Pertinent negatives include no fatigue.   Chief Complaint  Patient presents with   Heartburn    Heart burn has been bad for the past several weeks, OTC meds not working   lab review    Relevant past medical, surgical, family and social history reviewed and updated as indicated. Interim medical history since our last visit reviewed. Allergies and medications reviewed and updated.  Review of Systems  Constitutional:  Negative for fatigue.  HENT:  Negative for hoarse voice and sore throat.   Eyes:  Negative for blurred vision.  Respiratory:  Negative for cough, choking and wheezing.   Cardiovascular:  Negative for chest pain.  Gastrointestinal:  Positive for heartburn. Negative for abdominal pain, dysphagia and nausea.  Endocrine: Negative for polydipsia and polyphagia.   Per HPI unless specifically indicated above     Objective:    There were no vitals taken for this visit.  Wt Readings from Last 3 Encounters:  09/04/20 213 lb 9.6 oz (96.9 kg)  08/23/20 223 lb (101.2 kg)  08/20/20 223 lb (101.2 kg)    Physical Exam Vitals and nursing note reviewed.  Constitutional:      General: He is not in acute distress.    Appearance: Normal appearance. He is not ill-appearing or diaphoretic.  HENT:     Head: Normocephalic and atraumatic.     Right Ear: Tympanic membrane  and external ear normal. There is no impacted cerumen.     Left Ear: External ear normal.     Nose: No congestion or rhinorrhea.     Mouth/Throat:     Pharynx: No oropharyngeal exudate or posterior oropharyngeal erythema.  Eyes:     Conjunctiva/sclera: Conjunctivae normal.     Pupils: Pupils are equal, round, and reactive to light.  Cardiovascular:     Rate and Rhythm: Normal rate and regular rhythm.     Heart sounds: No murmur heard.   No friction rub. No gallop.   Pulmonary:     Effort: No respiratory distress.     Breath sounds: No stridor. No wheezing or rhonchi.  Chest:     Chest wall: No tenderness.  Abdominal:     General: Abdomen is flat. Bowel sounds are normal.     Palpations: Abdomen is soft. There is no mass.     Tenderness: There is no abdominal tenderness.  Musculoskeletal:     Cervical back: Normal range of motion and neck supple. No rigidity or tenderness.     Left lower leg: No edema.  Skin:    General: Skin is warm and dry.  Neurological:     Mental Status: He is alert.    Results for orders placed or performed in visit on 08/28/20  Microalbumin, Urine Waived  Result Value Ref Range   Microalb, Ur Waived 10 0 - 19 mg/L   Creatinine, Urine Waived 100 10 - 300 mg/dL   Microalb/Creat Ratio <30 <30 mg/g  Lipid panel  Result Value Ref Range   Cholesterol, Total 158 100 - 199 mg/dL   Triglycerides 79 0 - 149 mg/dL   HDL 77 >39 mg/dL   VLDL Cholesterol Cal 15 5 - 40 mg/dL   LDL Chol Calc (NIH) 66 0 - 99 mg/dL   Chol/HDL Ratio 2.1 0.0 - 5.0 ratio  Bayer DCA Hb A1c Waived  Result Value Ref Range   HB A1C (BAYER DCA - WAIVED) 7.1 (H) <7.0 %  Thyroid Panel With TSH  Result Value Ref Range   TSH 3.320 0.450 - 4.500 uIU/mL   T4, Total 11.5 4.5 - 12.0 ug/dL   T3 Uptake Ratio >56 (H) 24 - 39 %   Free Thyroxine Index >6.4 (H) 1.2 - 4.9  CBC with Differential/Platelet  Result Value Ref Range   WBC 5.8 3.4 - 10.8 x10E3/uL   RBC 4.41 4.14 - 5.80 x10E6/uL   Hemoglobin 14.5 13.0 - 17.7 g/dL   Hematocrit 42.8 37.5 - 51.0 %   MCV 97 79 - 97 fL   MCH 32.9 26.6 - 33.0 pg   MCHC 33.9 31.5 - 35.7 g/dL   RDW 12.6 11.6 - 15.4 %   Platelets 94 (LL) 150 - 450 x10E3/uL   Neutrophils 55 Not Estab. %   Lymphs 27 Not Estab. %   Monocytes 13 Not Estab. %   Eos 4 Not Estab. %   Basos 1 Not Estab. %   Neutrophils Absolute 3.2 1.4 - 7.0 x10E3/uL   Lymphocytes Absolute 1.6 0.7 - 3.1 x10E3/uL   Monocytes Absolute 0.7 0.1 - 0.9 x10E3/uL    EOS (ABSOLUTE) 0.2 0.0 - 0.4 x10E3/uL   Basophils Absolute 0.1 0.0 - 0.2 x10E3/uL   Immature Granulocytes 0 Not Estab. %   Immature Grans (Abs) 0.0 0.0 - 0.1 x10E3/uL   Hematology Comments: Note:   Comprehensive metabolic panel  Result Value Ref Range   Glucose 284 (H) 65 - 99  mg/dL   BUN 12 6 - 24 mg/dL   Creatinine, Ser 0.73 (L) 0.76 - 1.27 mg/dL   eGFR 106 >59 mL/min/1.73   BUN/Creatinine Ratio 16 9 - 20   Sodium 134 134 - 144 mmol/L   Potassium 4.9 3.5 - 5.2 mmol/L   Chloride 99 96 - 106 mmol/L   CO2 22 20 - 29 mmol/L   Calcium 9.2 8.7 - 10.2 mg/dL   Total Protein 7.1 6.0 - 8.5 g/dL   Albumin 3.4 (L) 3.8 - 4.9 g/dL   Globulin, Total 3.7 1.5 - 4.5 g/dL   Albumin/Globulin Ratio 0.9 (L) 1.2 - 2.2   Bilirubin Total 1.2 0.0 - 1.2 mg/dL   Alkaline Phosphatase 175 (H) 44 - 121 IU/L   AST 63 (H) 0 - 40 IU/L   ALT 73 (H) 0 - 44 IU/L  PSA Total+%Free (Serial)  Result Value Ref Range   Prostate Specific Ag, Serum 0.1 0.0 - 4.0 ng/mL   PSA, Free 0.05 N/A ng/mL   PSA, Free Pct 50.0 %        Current Outpatient Medications:    aspirin 325 MG tablet, Take 1 tablet (325 mg total) by mouth daily., Disp: 120 tablet, Rfl: 0   blood glucose meter kit and supplies KIT, Dispense based on patient and insurance preference. Use up to four times daily as directed. (FOR ICD-9 250.00, 250.01)., Disp: 1 each, Rfl: 0   empagliflozin (JARDIANCE) 10 MG TABS tablet, Take 1 tablet (10 mg total) by mouth daily before breakfast., Disp: 30 tablet, Rfl: 2   gabapentin (NEURONTIN) 100 MG capsule, Take 1 capsule (100 mg total) by mouth every evening., Disp: 30 capsule, Rfl: 3   lidocaine (LIDODERM) 5 %, Place 1 patch onto the skin daily. Remove & Discard patch within 12 hours or as directed by MD, Disp: 30 patch, Rfl: 3   losartan (COZAAR) 50 MG tablet, Take 1 tablet (50 mg total) by mouth daily., Disp: 90 tablet, Rfl: 2   meloxicam (MOBIC) 7.5 MG tablet, Take 7.5 mg by mouth daily., Disp: , Rfl:    metFORMIN  (GLUCOPHAGE) 1000 MG tablet, Take 1 tablet (1,000 mg total) by mouth 2 (two) times daily with a meal., Disp: 60 tablet, Rfl: 2   nicotine (NICODERM CQ - DOSED IN MG/24 HOURS) 14 mg/24hr patch, Place 1 patch (14 mg total) onto the skin daily., Disp: 28 patch, Rfl: 1   tamsulosin (FLOMAX) 0.4 MG CAPS capsule, Take 1 capsule (0.4 mg total) by mouth daily., Disp: 30 capsule, Rfl: 11    Assessment & Plan:  DM :  check HbA1c,  urine  microalbumin  diabetic diet plan given to pt  adviced regarding hypoglycemia and instructions given to pt today on how to prevent and treat the same if it were to occur. pt acknowledges the plan and voices understanding of the same.  exercise plan given and encouraged.   advice diabetic yearly podiatry, ophthalmology , nutritionist , dental check q 6 months,   Ref. Range 08/14/2020 15:41 08/14/2020 15:46 08/20/2020 13:49 08/28/2020 08:13  HB A1C (BAYER DCA - WAIVED) Latest Ref Range: <7.0 % 7.2 (H)   7.1 (H)    BACK PAIN / SHOULDER PAIN :  Dr Arbie Cookey seen pt.  GOT A shot and medrol dose pak per ortho per pt.  Has Been to PT x only once last few weeks.  Twice a week x 4 weeks.    [place] to have an embolization procedure @ radiology @ [place]  for such   Problem List Items Addressed This Visit   None    No orders of the defined types were placed in this encounter.    No orders of the defined types were placed in this encounter.    Follow up plan: No follow-ups on file.

## 2020-09-19 ENCOUNTER — Other Ambulatory Visit: Payer: Self-pay | Admitting: Student

## 2020-09-19 ENCOUNTER — Telehealth: Payer: Self-pay

## 2020-09-19 NOTE — Telephone Encounter (Signed)
Tammy spoke with patient's brother, documented in a different encounter.

## 2020-09-19 NOTE — Telephone Encounter (Signed)
Copied from [place] [id number]. Topic: General - Other >> Sep 19, 2020  1:50 PM Tessa Lerner A wrote: Reason for CRM: Patient's brother has returned a missed call from practice  Please contact again when possible   Agent saw no open encounters at the time of returned call

## 2020-09-19 NOTE — Telephone Encounter (Signed)
Spoke with patient's brother he received all the prescriptions that were sent over for his brother with the exception of test strips, will resend to pharmacy.

## 2020-09-20 ENCOUNTER — Other Ambulatory Visit: Payer: Self-pay

## 2020-09-20 ENCOUNTER — Telehealth: Payer: Self-pay

## 2020-09-20 ENCOUNTER — Ambulatory Visit (HOSPITAL_COMMUNITY)
Admission: RE | Admit: 2020-09-20 | Discharge: 2020-09-20 | Disposition: A | Discharge status: Home / Self Care | Payer: Medicaid Other | Source: Ambulatory Visit | Attending: Interventional Radiology | Admitting: Interventional Radiology

## 2020-09-20 DIAGNOSIS — Z539 Procedure and treatment not carried out, unspecified reason: Secondary | ICD-10-CM | POA: Diagnosis present

## 2020-09-20 DIAGNOSIS — R21 Rash and other nonspecific skin eruption: Secondary | ICD-10-CM | POA: Diagnosis not present

## 2020-09-20 DIAGNOSIS — K769 Liver disease, unspecified: Secondary | ICD-10-CM

## 2020-09-20 LAB — GLUCOSE, CAPILLARY: Glucose-Capillary: 219 mg/dL — ABNORMAL HIGH (ref 70–99)

## 2020-09-20 MED ORDER — SODIUM CHLORIDE 0.9 % IV SOLN: INTRAVENOUS | Status: DC

## 2020-09-20 MED ORDER — SODIUM CHLORIDE 0.9 % IV SOLN
8.0000 mg | INTRAVENOUS | Status: DC
  Filled 2020-09-20: qty 4

## 2020-09-20 MED ORDER — DEXAMETHASONE SODIUM PHOSPHATE 10 MG/ML IJ SOLN: 8.0000 mg | INTRAMUSCULAR | Status: DC

## 2020-09-20 MED ORDER — PIPERACILLIN-TAZOBACTAM 3.375 G IVPB 30 MIN
3.3750 g | INTRAVENOUS | Status: DC
  Filled 2020-09-20: qty 50

## 2020-09-20 NOTE — Telephone Encounter (Signed)
Spoke with brother Dominica Severin, he stated that he would bring over the notes from [facility], where the took his brother for some injections. Patient also has an appt scheduled in Sept. To see doctor.

## 2020-09-20 NOTE — Telephone Encounter (Signed)
-----   Message from Charlynne Cousins, MD sent at 09/18/2020  9:39 AM EDT ----- Need notes from Ortho patient saw them last week and had injections. Please also call the patient to schedule a follow-up for 3 months with labs

## 2020-09-20 NOTE — Progress Notes (Signed)
Patient arrive to Short Stay for IR procedure with poison oak covering bilateral arms and legs, as well as medial thighs. Maryln Manuel, PA and Dr Pascal Lux notified.  Procedure cancelled.

## 2020-09-20 NOTE — Progress Notes (Signed)
IR.  Patient was scheduled for an image-guided mesenteric arteriogram with possible bland hepatic embolization today in IR with Dr. Pascal Lux.  Patient presented to [facility] today with a new full body pruritic rash, including bilateral arms, legs, and groins. States rash is new since this weekend and thinks it is "poison oak" as he was "weed eating this weekend". Patient examined by myself and Dr. Pascal Lux. RN at bedside as well.  Unfortunately, unable to proceed with procedure today as rash is extended into both groins (access point for procedure). Discussed with patient- he is upset about procedure being postponed again. Requests RN call his brother to pick him up. Plan on rescheduling procedure if patient agreeable.  Please call IR with questions/concerns.   Bea Graff Brynlea Spindler, PA-C 09/20/2020, 12:14 PM

## 2020-09-24 ENCOUNTER — Ambulatory Visit (INDEPENDENT_AMBULATORY_CARE_PROVIDER_SITE_OTHER): Payer: Medicaid Other | Admitting: Internal Medicine

## 2020-09-24 ENCOUNTER — Other Ambulatory Visit: Payer: Self-pay

## 2020-09-24 ENCOUNTER — Encounter: Payer: Self-pay | Admitting: Internal Medicine

## 2020-09-24 VITALS — BP 140/68 | HR 68 | Ht 74.0 in | Wt 207.0 lb

## 2020-09-24 DIAGNOSIS — R7989 Other specified abnormal findings of blood chemistry: Secondary | ICD-10-CM | POA: Diagnosis not present

## 2020-09-24 DIAGNOSIS — I10 Essential (primary) hypertension: Secondary | ICD-10-CM

## 2020-09-24 DIAGNOSIS — E119 Type 2 diabetes mellitus without complications: Secondary | ICD-10-CM | POA: Diagnosis not present

## 2020-09-24 LAB — T4, FREE: Free T4: 0.81 ng/dL (ref 0.60–1.60)

## 2020-09-24 LAB — T3, FREE: T3, Free: 3.1 pg/mL (ref 2.3–4.2)

## 2020-09-24 LAB — TSH: TSH: 1.01 u[IU]/mL (ref 0.35–4.50)

## 2020-09-24 NOTE — Patient Instructions (Signed)
-   Moving forward , if your thyroid needs to be checked , they should only check the " Free" forms, as the other tests would include thyroid binding protein which is affected by liver conditions and would give false results

## 2020-09-24 NOTE — Progress Notes (Signed)
Name: Johnny Kelley  MRN/ DOB: [id number], 14-Oct-1965    Age/ Sex: 55 y.o., male    PCP: Charlynne Cousins, MD   Reason for Endocrinology Evaluation: Abnormal thyroid tests     Date of Initial Endocrinology Evaluation: 09/24/2020     HPI: Johnny Kelley is a 55 y.o. male with a past medical history of T2DM, HTN , Hep C , hx of drug abuse and hepatic mass. The patient presented for initial endocrinology clinic visit on 09/24/2020 for consultative assistance with his Abnormal Thyroid tests.   Pt has been noted to have elevated free thyroxine index and elevated T3 uptake ratio with normal T4 and TSH at 11.5 ug/dL and 3.320 uIU/mL respectively.   Today he is accompanied by his brother    He has been noted with weight loss Denies diarrhea or loose stools  Denies palpitations  Denies local neck symptoms  Denies radiation exposure  No Biotin use    Denies Fh of thyroid disease     HISTORY:  Past Medical History:  Past Medical History:  Diagnosis Date   Cancer, hepatocellular ([place])    Diabetes mellitus without complication ([place])    Hepatitis C    Hypertension    Psoriasis    Stroke ([place])    Hx stroke in 2020   Substance abuse ([place])    Past Surgical History:  Past Surgical History:  Procedure Laterality Date   IR RADIOLOGIST EVAL & MGMT  01/19/2020   IR RADIOLOGIST EVAL & MGMT  01/31/2020   IR RADIOLOGIST EVAL & MGMT  04/12/2020   IR RADIOLOGIST EVAL & MGMT  08/30/2020   KNEE SURGERY Right     Social History:  reports that he has been smoking cigarettes. He has a 9.50 pack-year smoking history. He has never used smokeless tobacco. He reports current alcohol use. He reports current drug use. Drugs: Cocaine and Marijuana. Family History: family history includes COPD in his paternal grandfather; Colon cancer in his father; Diabetes in his father and mother; Hyperlipidemia in his paternal grandmother; Kidney disease in his maternal grandmother; Lung cancer in his  maternal grandfather.   HOME MEDICATIONS: Allergies as of 09/24/2020       Reactions   Amlodipine Swelling        Medication List        Accurate as of September 24, 2020 12:36 PM. If you have any questions, ask your nurse or doctor.          aspirin 325 MG tablet Take 1 tablet (325 mg total) by mouth daily.   blood glucose meter kit and supplies Kit Dispense based on patient and insurance preference. Use up to four times daily as directed. (FOR ICD-9 250.00, 250.01).   empagliflozin 10 MG Tabs tablet Commonly known as: Jardiance Take 1 tablet (10 mg total) by mouth daily before breakfast.   famotidine 20 MG tablet Commonly known as: PEPCID Take 1 tablet (20 mg total) by mouth at bedtime.   gabapentin 100 MG capsule Commonly known as: NEURONTIN Take 1 capsule (100 mg total) by mouth every evening.   lidocaine 5 % Commonly known as: Lidoderm Place 1 patch onto the skin daily. Remove & Discard patch within 12 hours or as directed by MD   losartan 50 MG tablet Commonly known as: COZAAR Take 1 tablet (50 mg total) by mouth daily.   meloxicam 7.5 MG tablet Commonly known as: MOBIC Take 7.5 mg by mouth daily.   metFORMIN 1000 MG  tablet Commonly known as: GLUCOPHAGE Take 1 tablet (1,000 mg total) by mouth 2 (two) times daily with a meal.   nicotine 14 mg/24hr patch Commonly known as: NICODERM CQ - dosed in mg/24 hours Place 1 patch (14 mg total) onto the skin daily.   tamsulosin 0.4 MG Caps capsule Commonly known as: FLOMAX Take 1 capsule (0.4 mg total) by mouth daily.          REVIEW OF SYSTEMS: A comprehensive ROS was conducted with the patient and is negative except as per HPI    OBJECTIVE:  VS: BP 140/68   Pulse 68   Ht _0  (1.88 m)   Wt 207 lb (93.9 kg)   SpO2 97%   BMI 26.58 kg/m    Wt Readings from Last 3 Encounters:  09/24/20 207 lb (93.9 kg)  09/20/20 213 lb 9.6 oz (96.9 kg)  09/04/20 213 lb 9.6 oz (96.9 kg)     EXAM: General: Pt  appears well and is in NAD  Neck: General: Supple without adenopathy. Thyroid: Thyroid size normal.  No goiter or nodules appreciated.   Lungs: Clear with good BS bilat with no rales, rhonchi, or wheezes  Heart: Auscultation: RRR.  Abdomen: Normoactive bowel sounds, soft, nontender  Extremities: Gait and station: Normal gait  Digits and nails: No clubbing, cyanosis, petechiae, or nodes Head and neck: Normal alignment and mobility BL UE: Normal ROM and strength. BL LE: No pretibial edema normal ROM and strength.  Skin: Patient with multiple erythematous macules scattered over upper and lower extremities  Mental Status: Judgment, insight: Intact Orientation: Oriented to time, place, and person Memory: Intact for recent and remote events Mood and affect: No depression, anxiety, or agitation     DATA REVIEWED:  Results for Johnny Kelley, Johnny Kelley (MRN [id number]) as of 09/24/2020 14:14  Ref. Range 09/24/2020 11:46  TSH Latest Ref Range: 0.35 - 4.50 uIU/mL 1.01  Triiodothyronine,Free,Serum Latest Ref Range: 2.3 - 4.2 pg/mL 3.1  T4,Free(Direct) Latest Ref Range: 0.60 - 1.60 ng/dL 0.81      Results for Johnny Kelley, Johnny Kelley (MRN [id number]) as of 09/24/2020 12:38  Ref. Range 08/28/2020 08:18  TSH Latest Ref Range: 0.450 - 4.500 uIU/mL 3.320  Thyroxine (T4) Latest Ref Range: 4.5 - 12.0 ug/dL 11.5  Free Thyroxine Index Latest Ref Range: 1.2 - 4.9  >6.4 (H)  T3 Uptake Ratio Latest Ref Range: 24 - 39 % >56 (H)    ASSESSMENT/PLAN/RECOMMENDATIONS:   Abnormal Thyroid function tests   -Patient is clinically euthyroid with a normal TSH Free thyroxine index and T3 uptake ratio use thyroid binding capacity which takes in to account changes in thyroid hormone carrier protein which in a pt with Hepatitic C and hepatocellular carcinoma would most Likely  be abnormal . -Repeat TFTs today are normal -I would recommend checking free forms of his TFTs rather than total to eliminate effects of any thyroxine  binding globulin abnormalities   Follow-up as needed  Signed electronically by: Mack Guise, MD  [facility]  [facility] [address] [place], [place] [address] Phone: [phone] FAX: [phone]   CC: Charlynne Cousins, MD [place] [address] Phone: [phone] Fax: [phone]   Return to Endocrinology clinic as below: Future Appointments  Date Time Provider [place]  10/16/2020 10:30 AM Ralene Bathe, MD [facility] None  11/27/2020  9:15 AM Hollice Espy, MD [facility] None  12/13/2020  9:00 AM [facility]  12/18/2020 10:45  AM [facility] None  12/18/2020 11:15 AM Sindy Guadeloupe, MD [facility] None  12/19/2020  8:20 AM Charlynne Cousins, MD [facility]

## 2020-09-26 DIAGNOSIS — M4802 Spinal stenosis, cervical region: Secondary | ICD-10-CM | POA: Diagnosis not present

## 2020-09-26 DIAGNOSIS — M5412 Radiculopathy, cervical region: Secondary | ICD-10-CM | POA: Diagnosis not present

## 2020-09-27 ENCOUNTER — Telehealth: Payer: Self-pay

## 2020-09-27 DIAGNOSIS — R200 Anesthesia of skin: Secondary | ICD-10-CM | POA: Diagnosis not present

## 2020-09-27 DIAGNOSIS — R42 Dizziness and giddiness: Secondary | ICD-10-CM | POA: Diagnosis not present

## 2020-09-27 DIAGNOSIS — R2689 Other abnormalities of gait and mobility: Secondary | ICD-10-CM | POA: Diagnosis not present

## 2020-09-27 DIAGNOSIS — Z8673 Personal history of transient ischemic attack (TIA), and cerebral infarction without residual deficits: Secondary | ICD-10-CM | POA: Diagnosis not present

## 2020-09-27 NOTE — Telephone Encounter (Signed)
Pt's brother (DPR) came to pick up CD from [facility] Cd was given to him

## 2020-10-03 DIAGNOSIS — M545 Low back pain, unspecified: Secondary | ICD-10-CM | POA: Diagnosis not present

## 2020-10-05 DIAGNOSIS — M545 Low back pain, unspecified: Secondary | ICD-10-CM | POA: Diagnosis not present

## 2020-10-10 DIAGNOSIS — M545 Low back pain, unspecified: Secondary | ICD-10-CM | POA: Diagnosis not present

## 2020-10-12 DIAGNOSIS — M545 Low back pain, unspecified: Secondary | ICD-10-CM | POA: Diagnosis not present

## 2020-10-16 ENCOUNTER — Ambulatory Visit (INDEPENDENT_AMBULATORY_CARE_PROVIDER_SITE_OTHER): Payer: Medicaid Other | Admitting: Dermatology

## 2020-10-16 ENCOUNTER — Other Ambulatory Visit: Payer: Self-pay

## 2020-10-16 DIAGNOSIS — L720 Epidermal cyst: Secondary | ICD-10-CM | POA: Diagnosis not present

## 2020-10-16 DIAGNOSIS — D2239 Melanocytic nevi of other parts of face: Secondary | ICD-10-CM | POA: Diagnosis not present

## 2020-10-16 DIAGNOSIS — D489 Neoplasm of uncertain behavior, unspecified: Secondary | ICD-10-CM

## 2020-10-16 DIAGNOSIS — D485 Neoplasm of uncertain behavior of skin: Secondary | ICD-10-CM

## 2020-10-16 MED ORDER — MUPIROCIN 2 % EX OINT: 1.0000 "application " | TOPICAL_OINTMENT | Freq: Every day | CUTANEOUS | 0 refills | Status: DC

## 2020-10-16 NOTE — Progress Notes (Signed)
Follow-Up Visit   Subjective  Johnny Kelley is a 55 y.o. male who presents for the following: Cyst (Left sup lat forehead - Excise today).  He has a spot on his left forehead near the cyst that has been present for a year or more.  It has been dark in color  The following portions of the chart were reviewed this encounter and updated as appropriate:   Tobacco  Allergies  Meds  Problems  Med Hx  Surg Hx  Fam Hx     Review of Systems:  No other skin or systemic complaints except as noted in HPI or Assessment and Plan.  Objective  Well appearing patient in no apparent distress; mood and affect are within normal limits.  A focused examination was performed including the face and scalp. Relevant physical exam findings are noted in the Assessment and Plan.  Left sup lat forehead 1.5 cm cystic papule  left sup lat forehead 0.6 cm gray blue papule   Assessment & Plan  Neoplasm of uncertain behavior Left sup lat forehead  Skin excision  Lesion length (cm):  1.5 Lesion width (cm):  1.5 Margin per side (cm):  0 Total excision diameter (cm):  1.5 Informed consent: discussed and consent obtained   Timeout: patient name, date of birth, surgical site, and procedure verified   Procedure prep:  Patient was prepped and draped in usual sterile fashion Prep type:  Isopropyl alcohol and povidone-iodine Anesthesia: the lesion was anesthetized in a standard fashion   Anesthetic:  1% lidocaine w/ epinephrine 1-100,000 buffered w/ 8.4% NaHCO3 Instrument used: #15 blade   Hemostasis achieved with: pressure   Hemostasis achieved with comment:  Electrocautery Outcome: patient tolerated procedure well with no complications   Post-procedure details: sterile dressing applied and wound care instructions given   Dressing type: bandage and pressure dressing (mupirocin)    Skin repair Complexity:  Complex Final length (cm):  2 Reason for type of repair: reduce tension to allow closure,  reduce the risk of dehiscence, infection, and necrosis, reduce subcutaneous dead space and avoid a hematoma, allow closure of the large defect, preserve normal anatomy, preserve normal anatomical and functional relationships and enhance both functionality and cosmetic results   Undermining: area extensively undermined   Undermining comment:  Undermining defect 1.5 cm Subcutaneous layers (deep stitches):  Suture size:  5-0 Suture type: Vicryl (polyglactin 910)   Subcutaneous suture technique: inverted dermal. Fine/surface layer approximation (top stitches):  Suture size:  5-0 Suture type: nylon   Stitches: simple running   Suture removal (days):  7 Hemostasis achieved with: suture and pressure Outcome: patient tolerated procedure well with no complications   Post-procedure details: sterile dressing applied and wound care instructions given   Dressing type: bandage and pressure dressing (mupirocin)    mupirocin ointment (BACTROBAN) 2 % Apply 1 application topically daily. With dressing changes  Specimen 1 - Surgical pathology Differential Diagnosis: Cyst vs other  Check Margins: No  Neoplasm of uncertain behavior of skin left sup lat forehead  Skin excision  Lesion length (cm):  0.6 Lesion width (cm):  0.6 Total excision diameter (cm):  0.6 Informed consent: discussed and consent obtained   Timeout: patient name, date of birth, surgical site, and procedure verified   Procedure prep:  Patient was prepped and draped in usual sterile fashion Prep type:  Isopropyl alcohol and povidone-iodine Anesthesia: the lesion was anesthetized in a standard fashion   Anesthetic:  1% lidocaine w/ epinephrine 1-100,000 buffered w/ 8.4%  NaHCO3 Instrument used: #15 blade   Hemostasis achieved with: pressure   Hemostasis achieved with comment:  Electrocautery Outcome: patient tolerated procedure well with no complications   Post-procedure details: sterile dressing applied and wound care  instructions given   Dressing type: bandage and pressure dressing (mupirocin)   Additional details:  Simple excision performed today   Specimen 2 - Surgical pathology Differential Diagnosis: Blue nevus vs other D48.5 Check Margins: No   Return in about 1 week (around 10/23/2020) for suture removal.  I, Ashok Cordia, CMA, am acting as scribe for Sarina Ser, MD . Documentation: I have reviewed the above documentation for accuracy and completeness, and I agree with the above.  Sarina Ser, MD

## 2020-10-16 NOTE — Patient Instructions (Signed)
Wound Care Instructions  Cleanse wound gently with soap and water once a day then pat dry with clean gauze. Apply a thing coat of Petrolatum (petroleum jelly, "Vaseline") over the wound (unless you have an allergy to this). We recommend that you use a new, sterile tube of Vaseline. Do not pick or remove scabs. Do not remove the yellow or white "healing tissue" from the base of the wound.  Cover the wound with fresh, clean, nonstick gauze and secure with paper tape. You may use Band-Aids in place of gauze and tape if the would is small enough, but would recommend trimming much of the tape off as there is often too much. Sometimes Band-Aids can irritate the skin.  You should call the office for your biopsy report after 1 week if you have not already been contacted.  If you experience any problems, such as abnormal amounts of bleeding, swelling, significant bruising, significant pain, or evidence of infection, please call the office immediately.  FOR ADULT SURGERY PATIENTS: If you need something for pain relief you may take 1 extra strength Tylenol (acetaminophen) AND 2 Ibuprofen (200mg each) together every 4 hours as needed for pain. (do not take these if you are allergic to them or if you have a reason you should not take them.) Typically, you may only need pain medication for 1 to 3 days.   If you have any questions or concerns for your doctor, please call our main line at [phone] and press option 4 to reach your doctor's medical assistant. If no one answers, please leave a voicemail as directed and we will return your call as soon as possible. Messages left after 4 pm will be answered the following business day.   You may also send us a message via MyChart. We typically respond to MyChart messages within 1-2 business days.  For prescription refills, please ask your pharmacy to contact our office. Our fax number is [phone].  If you have an urgent issue when the clinic is closed that  cannot wait until the next business day, you can page your doctor at the number below.    Please note that while we do our best to be available for urgent issues outside of office hours, we are not available 24/7.   If you have an urgent issue and are unable to reach us, you may choose to seek medical care at your doctor's office, retail clinic, urgent care center, or emergency room.  If you have a medical emergency, please immediately call 911 or go to the emergency department.  Pager Numbers  - Dr. Kowalski: [phone]  - Dr. Moye: [phone]  - Dr. Stewart: [phone]  In the event of inclement weather, please call our main line at [phone] for an update on the status of any delays or closures.  Dermatology Medication Tips: Please keep the boxes that topical medications come in in order to help keep track of the instructions about where and how to use these. Pharmacies typically print the medication instructions only on the boxes and not directly on the medication tubes.   If your medication is too expensive, please contact our office at [phone] option 4 or send us a message through MyChart.   We are unable to tell what your co-pay for medications will be in advance as this is different depending on your insurance coverage. However, we may be able to find a substitute medication at lower cost or fill out paperwork to get insurance to cover a needed   medication.   If a prior authorization is required to get your medication covered by your insurance company, please allow us 1-2 business days to complete this process.  Drug prices often vary depending on where the prescription is filled and some pharmacies may offer cheaper prices.  The website www.goodrx.com contains coupons for medications through different pharmacies. The prices here do not account for what the cost may be with help from insurance (it may be cheaper with your insurance), but the website can give you the  price if you did not use any insurance.  - You can print the associated coupon and take it with your prescription to the pharmacy.  - You may also stop by our office during regular business hours and pick up a GoodRx coupon card.  - If you need your prescription sent electronically to a different pharmacy, notify our office through [facility] MyChart or by phone at [phone] option 4.   

## 2020-10-17 ENCOUNTER — Telehealth: Payer: Self-pay

## 2020-10-17 NOTE — Telephone Encounter (Signed)
Spoke with patient's brother regarding surgery. He is doing fine/hd

## 2020-10-18 ENCOUNTER — Encounter: Payer: Self-pay | Admitting: Dermatology

## 2020-10-23 ENCOUNTER — Ambulatory Visit (INDEPENDENT_AMBULATORY_CARE_PROVIDER_SITE_OTHER): Payer: Medicaid Other | Admitting: Dermatology

## 2020-10-23 ENCOUNTER — Other Ambulatory Visit: Payer: Self-pay

## 2020-10-23 DIAGNOSIS — D485 Neoplasm of uncertain behavior of skin: Secondary | ICD-10-CM

## 2020-10-23 DIAGNOSIS — L578 Other skin changes due to chronic exposure to nonionizing radiation: Secondary | ICD-10-CM

## 2020-10-23 DIAGNOSIS — A630 Anogenital (venereal) warts: Secondary | ICD-10-CM | POA: Diagnosis not present

## 2020-10-23 DIAGNOSIS — L570 Actinic keratosis: Secondary | ICD-10-CM | POA: Diagnosis not present

## 2020-10-23 NOTE — Progress Notes (Signed)
Follow-Up Visit   Subjective  Johnny Kelley is a 55 y.o. male who presents for the following: Actinic Keratosis (Of the face and scalp - patient does still have scaly crusted lesions on the face and scalp) and suture removal/post op (Patient is here today for suture removal for NOB of the L sup lat forehead).  The following portions of the chart were reviewed this encounter and updated as appropriate:   Tobacco  Allergies  Meds  Problems  Med Hx  Surg Hx  Fam Hx     Review of Systems:  No other skin or systemic complaints except as noted in HPI or Assessment and Plan.  Objective  Well appearing patient in no apparent distress; mood and affect are within normal limits.  A focused examination was performed including the face and scalp. Relevant physical exam findings are noted in the Assessment and Plan.  The face and scalp (21) Erythematous thin papules/macules with gritty scale.   L sup lat forehead Healing excision site.   Assessment & Plan  AK (actinic keratosis) (21) The face and scalp  Destruction of lesion - The face and scalp Complexity: simple   Destruction method: cryotherapy   Informed consent: discussed and consent obtained   Timeout:  patient name, date of birth, surgical site, and procedure verified Lesion destroyed using liquid nitrogen: Yes   Region frozen until ice ball extended beyond lesion: Yes   Outcome: patient tolerated procedure well with no complications   Post-procedure details: wound care instructions given    Neoplasm of uncertain behavior of skin L sup lat forehead  Encounter for Removal of Sutures - Incision site at the L sup lat forehead is clean, dry and intact - Wound cleansed, sutures removed, wound cleansed and steri strips applied.  - Will contact patient with pathology results. - Patient advised to keep steri-strips dry until they fall off. - Scars remodel for a full year. - Once steri-strips fall off, patient can apply  over-the-counter silicone scar cream each night to help with scar remodeling if desired. - Patient advised to call with any concerns or if they notice any new or changing lesions.   Actinic Damage - Severe, confluent actinic changes with pre-cancerous actinic keratoses  - Severe, chronic, not at goal, secondary to cumulative UV radiation exposure over time - diffuse scaly erythematous macules and papules with underlying dyspigmentation - Discussed Prescription "Field Treatment" for Severe, Chronic Confluent Actinic Changes with Pre-Cancerous Actinic Keratoses Field treatment involves treatment of an entire area of skin that has confluent Actinic Changes (Sun/ Ultraviolet light damage) and PreCancerous Actinic Keratoses by method of PhotoDynamic Therapy (PDT) and/or prescription Topical Chemotherapy agents such as 5-fluorouracil, 5-fluorouracil/calcipotriene, and/or imiquimod.  The purpose is to decrease the number of clinically evident and subclinical PreCancerous lesions to prevent progression to development of skin cancer by chemically destroying early precancer changes that may or may not be visible.  It has been shown to reduce the risk of developing skin cancer in the treated area. As a result of treatment, redness, scaling, crusting, and open sores may occur during treatment course. One or more than one of these methods may be used and may have to be used several times to control, suppress and eliminate the PreCancerous changes. Discussed treatment course, expected reaction, and possible side effects. - Recommend daily broad spectrum sunscreen SPF 30+ to sun-exposed areas, reapply every 2 hours as needed.  - Staying in the shade or wearing long sleeves, sun glasses (UVA+UVB protection)  and wide brim hats (4-inch brim around the entire circumference of the hat) are also recommended. - Call for new or changing lesions.  - Plan PDT or topical chemotherapy cream at follow up appointment.  Return in  about 6 weeks (around 12/04/2020) for AK follow up .  Luther Redo, CMA, am acting as scribe for Sarina Ser, MD . Documentation: I have reviewed the above documentation for accuracy and completeness, and I agree with the above.  Sarina Ser, MD

## 2020-10-23 NOTE — Patient Instructions (Signed)

## 2020-10-24 ENCOUNTER — Telehealth: Payer: Self-pay | Admitting: *Deleted

## 2020-10-24 ENCOUNTER — Encounter: Payer: Self-pay | Admitting: Dermatology

## 2020-10-24 MED ORDER — TRIAMCINOLONE 0.1 % CREAM:EUCERIN CREAM 1:1: 1.0000 "application " | TOPICAL_CREAM | Freq: Two times a day (BID) | CUTANEOUS | 0 refills | Status: DC

## 2020-10-24 MED ORDER — TRIAMCINOLONE 0.1 % CREAM:EUCERIN CREAM 1:1: 1.0000 "application " | TOPICAL_CREAM | Freq: Two times a day (BID) | CUTANEOUS | 0 refills | Status: AC

## 2020-10-24 NOTE — Addendum Note (Signed)
Addended by: Donzetta Kohut A on: 10/24/2020 04:56 PM   Modules accepted: Orders

## 2020-10-24 NOTE — Telephone Encounter (Signed)
Please see what he is using this for and ok to refill

## 2020-10-24 NOTE — Telephone Encounter (Signed)
Pt brother came in and wants to know if you can prescribe Johnny Kelley a cream he had from his previous Dr. He just need a prescription sent to [facility] please and Thanks  Triamcinolone Acetonide 0.1 cream usp 30 gm  1 application topically 2 x's a day

## 2020-10-24 NOTE — Telephone Encounter (Signed)
Patient's brother states that he uses the cream for dry flaky areas on his skin. Refill was sent in

## 2020-10-26 ENCOUNTER — Telehealth: Payer: Self-pay | Admitting: *Deleted

## 2020-10-26 NOTE — Telephone Encounter (Signed)
Mr. Johnny Kelley brother Johnny Kelley called and states [name] has decided not to have anymore treatment with IR. He states his brother has given up. I told him to call us if anything changes.Cathren Harsh

## 2020-10-30 ENCOUNTER — Telehealth: Payer: Self-pay

## 2020-10-30 ENCOUNTER — Other Ambulatory Visit: Payer: Self-pay

## 2020-10-30 NOTE — Progress Notes (Signed)
Error

## 2020-10-30 NOTE — Telephone Encounter (Signed)
-----   Message from Ralene Bathe, MD sent at 10/30/2020 12:05 PM EDT ----- Diagnosis 1. Skin (M), left sup lat forehead EPIDERMOID CYST 2. Skin , left sup lat forehead CELLULAR BLUE NEVUS  1- benign cyst 2- benign blue nevus May persist or recur

## 2020-10-30 NOTE — Telephone Encounter (Signed)
Patient informed of pathology results 

## 2020-11-02 DIAGNOSIS — M25511 Pain in right shoulder: Secondary | ICD-10-CM | POA: Diagnosis not present

## 2020-11-02 DIAGNOSIS — M5416 Radiculopathy, lumbar region: Secondary | ICD-10-CM | POA: Diagnosis not present

## 2020-11-06 ENCOUNTER — Ambulatory Visit: Payer: Medicaid Other | Admitting: Anesthesiology

## 2020-11-06 ENCOUNTER — Ambulatory Visit
Admission: RE | Admit: 2020-11-06 | Discharge: 2020-11-06 | Disposition: A | Discharge status: Home / Self Care | Payer: Medicaid Other | Attending: Gastroenterology | Admitting: Gastroenterology

## 2020-11-06 ENCOUNTER — Encounter
Admission: RE | Disposition: A | Discharge status: Home / Self Care | Payer: Self-pay | Source: Home / Self Care | Attending: Gastroenterology

## 2020-11-06 ENCOUNTER — Other Ambulatory Visit: Payer: Self-pay

## 2020-11-06 ENCOUNTER — Encounter: Payer: Self-pay | Admitting: Gastroenterology

## 2020-11-06 DIAGNOSIS — Z1211 Encounter for screening for malignant neoplasm of colon: Secondary | ICD-10-CM | POA: Diagnosis not present

## 2020-11-06 DIAGNOSIS — Z5309 Procedure and treatment not carried out because of other contraindication: Secondary | ICD-10-CM | POA: Diagnosis not present

## 2020-11-06 LAB — URINE DRUG SCREEN, QUALITATIVE (ARMC ONLY)
Amphetamines, Ur Screen: NOT DETECTED
Barbiturates, Ur Screen: NOT DETECTED
Benzodiazepine, Ur Scrn: NOT DETECTED
Cannabinoid 50 Ng, Ur ~~LOC~~: NOT DETECTED
Cocaine Metabolite,Ur ~~LOC~~: POSITIVE — AB
MDMA (Ecstasy)Ur Screen: NOT DETECTED
Methadone Scn, Ur: NOT DETECTED
Opiate, Ur Screen: NOT DETECTED
Phencyclidine (PCP) Ur S: NOT DETECTED
Tricyclic, Ur Screen: NOT DETECTED

## 2020-11-06 LAB — GLUCOSE, CAPILLARY: Glucose-Capillary: 173 mg/dL — ABNORMAL HIGH (ref 70–99)

## 2020-11-06 SURGERY — COLONOSCOPY WITH PROPOFOL
Anesthesia: General

## 2020-11-06 MED ORDER — SODIUM CHLORIDE 0.9 % IV SOLN
INTRAVENOUS | Status: DC
Start: 2020-11-06 — End: 2020-11-06

## 2020-11-06 NOTE — Progress Notes (Signed)
Patient cancelled due to positive cocaine UDS

## 2020-11-06 NOTE — H&P (Signed)
Johnny Lame, MD [address] Johnny [place], [place] [address] Phone: [phone] Fax : [phone]  Primary Care Physician:  Charlynne Cousins, MD Primary Gastroenterologist:  Dr. Allen Norris  Pre-Procedure History & Physical: HPI:  Johnny Kelley is a 55 y.o. male is here for a screening colonoscopy.   Past Medical History:  Diagnosis Date   Cancer, hepatocellular ([place])    Diabetes mellitus without complication ([place])    Hepatitis C    Hypertension    Psoriasis    Stroke ([place])    Hx stroke in 2020   Substance abuse [facility])     Past Surgical History:  Procedure Laterality Date   IR RADIOLOGIST EVAL & MGMT  01/19/2020   IR RADIOLOGIST EVAL & MGMT  01/31/2020   IR RADIOLOGIST EVAL & MGMT  04/12/2020   IR RADIOLOGIST EVAL & MGMT  08/30/2020   KNEE SURGERY Right     Prior to Admission medications   Medication Sig Start Date End Date Taking? Authorizing Provider  aspirin 325 MG tablet Take 1 tablet (325 mg total) by mouth daily. 02/28/20  Yes Iloabachie, Chioma E, NP  blood glucose meter kit and supplies KIT Dispense based on patient and insurance preference. Use up to four times daily as directed. (FOR ICD-9 250.00, 250.01). 10/27/19  Yes Iloabachie, Chioma E, NP  empagliflozin (JARDIANCE) 10 MG TABS tablet Take 1 tablet (10 mg total) by mouth daily before breakfast. 09/04/20  Yes Vigg, Avanti, MD  famotidine (PEPCID) 20 MG tablet Take 1 tablet (20 mg total) by mouth at bedtime. 09/18/20  Yes Vigg, Avanti, MD  gabapentin (NEURONTIN) 100 MG capsule Take 1 capsule (100 mg total) by mouth every evening. 09/04/20  Yes Vigg, Avanti, MD  lidocaine (LIDODERM) 5 % Place 1 patch onto the skin daily. Remove & Discard patch within 12 hours or as directed by MD 09/04/20  Yes Vigg, Avanti, MD  losartan (COZAAR) 50 MG tablet Take 1 tablet (50 mg total) by mouth daily. 08/14/20  Yes Vigg, Avanti, MD  meloxicam (MOBIC) 7.5 MG tablet Take 7.5 mg by mouth daily. 09/07/20  Yes [provider]   metFORMIN (GLUCOPHAGE) 1000 MG tablet Take 1 tablet (1,000 mg total) by mouth 2 (two) times daily with a meal. 08/14/20  Yes Vigg, Avanti, MD  mupirocin ointment (BACTROBAN) 2 % Apply 1 application topically daily. With dressing changes 10/16/20  Yes Ralene Bathe, MD  nicotine (NICODERM CQ - DOSED IN MG/24 HOURS) 14 mg/24hr patch Place 1 patch (14 mg total) onto the skin daily. 09/04/20  Yes Vigg, Avanti, MD  tamsulosin (FLOMAX) 0.4 MG CAPS capsule Take 1 capsule (0.4 mg total) by mouth daily. 08/23/20  Yes Hollice Espy, MD  Triamcinolone Acetonide (TRIAMCINOLONE 0.1 % CREAM : EUCERIN) CREA Apply 1 application topically 2 (two) times daily. 10/24/20 11/23/20 Yes Vigg, Avanti, MD    Allergies as of 09/17/2020 - Review Complete 09/17/2020  Allergen Reaction Noted   Amlodipine Swelling 01/31/2020    Family History  Problem Relation Age of Onset   Colon cancer Father    Diabetes Father    Diabetes Mother    Kidney disease Maternal Grandmother    Lung cancer Maternal Grandfather    Hyperlipidemia Paternal Grandmother    COPD Paternal Grandfather     Social History   Socioeconomic History   Marital status: Widowed    Spouse name: Not on file   Number of children: Not on file   Years of education: Not on file  Highest education level: Not on file  Occupational History   Occupation: unemployed  Tobacco Use   Smoking status: Every Day    Packs/day: 0.25    Years: 38.00    Pack years: 9.50    Types: Cigarettes   Smokeless tobacco: Never  Vaping Use   Vaping Use: Never used  Substance and Sexual Activity   Alcohol use: Yes    Alcohol/week: 21.0 standard drinks    Types: 21 Cans of beer per week   Drug use: Yes    Types: Cocaine, Marijuana    Comment: reports marijuana use once per month and cocaine use 2-3 times per month   Sexual activity: Not on file  Other Topics Concern   Not on file  Social History Narrative   Not on file   Social Determinants of Health    Financial Resource Strain: Not on file  Food Insecurity: Not on file  Transportation Needs: No Transportation Needs   Lack of Transportation (Medical): No   Lack of Transportation (Non-Medical): No  Physical Activity: Not on file  Stress: Stress Concern Present   Feeling of Stress : To some extent  Social Connections: Socially Isolated   Frequency of Communication with Friends and Family: Never   Frequency of Social Gatherings with Friends and Family: More than three times a week   Attends Religious Services: Never   Marine scientist or Organizations: No   Attends Music therapist: Never   Marital Status: Divorced  Human resources officer Violence: Not on file    Review of Systems: See HPI, otherwise negative ROS  Physical Exam: BP 140/76   Pulse 79   Temp (!) 97 F (36.1 C) (Temporal)   Resp 18   Ht '6\' 2"'  (1.88 m)   Wt 95.3 kg   SpO2 97%   BMI 26.96 kg/m  General:   Alert,  pleasant and cooperative in NAD Head:  Normocephalic and atraumatic. Neck:  Supple; no masses or thyromegaly. Lungs:  Clear throughout to auscultation.    Heart:  Regular rate and rhythm. Abdomen:  Soft, nontender and nondistended. Normal bowel sounds, without guarding, and without rebound.   Neurologic:  Alert and  oriented x4;  grossly normal neurologically.  Impression/Plan: Johnny Kelley is now here to undergo a screening colonoscopy.  Risks, benefits, and alternatives regarding colonoscopy have been reviewed with the patient.  Questions have been answered.  All parties agreeable.

## 2020-11-06 NOTE — Anesthesia Preprocedure Evaluation (Signed)
Anesthesia Evaluation  Patient identified by MRN, date of birth, ID band Patient awake    Reviewed: Allergy & Precautions, NPO status   History of Anesthesia Complications Negative for: history of anesthetic complications  Airway Mallampati: II  TM Distance: >3 FB Neck ROM: Full    Dental  (+) Poor Dentition, Missing   Pulmonary Current Smoker and Patient abstained from smoking.,    Pulmonary exam normal        Cardiovascular Exercise Tolerance: Poor hypertension, Pt. on medications Normal cardiovascular examI     Neuro/Psych CVA, Residual Symptoms    GI/Hepatic GERD  Controlled,  Endo/Other  diabetes  Renal/GU      Musculoskeletal   Abdominal Normal abdominal exam  (+)   Peds  Hematology   Anesthesia Other Findings Echo 09/18/18 1. Moderate hypokinesis of the left ventricular, basal-mid inferior wall  and inferolateral wall.  2. The left ventricle has low normal systolic function, with an ejection  fraction of 50-55%. The cavity size was normal. There is moderately  increased left ventricular wall thickness. Left ventricular diastolic  Doppler parameters are consistent with  impaired relaxation. Elevated mean left atrial pressure.  3. The right ventricle has normal systolic function. The cavity was  normal. There is no increase in right ventricular wall thickness. Right  ventricular systolic pressure could not be assessed.  4. Left atrial size was mildly dilated.  5. Right atrial size was mildly dilated.  6. The mitral valve is grossly normal. There is moderate mitral annular  calcification present.  7. The aortic valve was not well visualized. Moderate thickening of the  aortic valve.  8. The aortic root is normal in size and structure.  9. The interatrial septum appears to be lipomatous.   Reproductive/Obstetrics                             Anesthesia Physical Anesthesia  Plan  ASA: 3  Anesthesia Plan: General   Post-op Pain Management:    Induction:   PONV Risk Score and Plan:   Airway Management Planned: Simple Face Mask  Additional Equipment: None  Intra-op Plan:   Post-operative Plan:   Informed Consent: I have reviewed the patients History and Physical, chart, labs and discussed the procedure including the risks, benefits and alternatives for the proposed anesthesia with the patient or authorized representative who has indicated his/her understanding and acceptance.       Plan Discussed with: CRNA  Anesthesia Plan Comments:         Anesthesia Quick Evaluation

## 2020-11-07 DIAGNOSIS — M545 Low back pain, unspecified: Secondary | ICD-10-CM | POA: Diagnosis not present

## 2020-11-09 DIAGNOSIS — M545 Low back pain, unspecified: Secondary | ICD-10-CM | POA: Diagnosis not present

## 2020-11-11 ENCOUNTER — Other Ambulatory Visit: Payer: Self-pay | Admitting: Internal Medicine

## 2020-11-11 DIAGNOSIS — E1140 Type 2 diabetes mellitus with diabetic neuropathy, unspecified: Secondary | ICD-10-CM

## 2020-11-11 NOTE — Telephone Encounter (Signed)
Requested Prescriptions  Pending Prescriptions Disp Refills  . metFORMIN (GLUCOPHAGE) 1000 MG tablet [Pharmacy Med Name: METFORMIN HCL 1,000 MG TABLET] 60 tablet 2    Sig: TAKE 1 TABLET (1,000 MG TOTAL) BY MOUTH 2 (TWO) TIMES DAILY WITH A MEAL.     Endocrinology:  Diabetes - Biguanides Failed - 11/11/2020 12:52 AM      Failed - Cr in normal range and within 360 days    Creatinine, Ser  Date Value Ref Range Status  08/28/2020 0.73 (L) 0.76 - 1.27 mg/dL Final         Passed - HBA1C is between 0 and 7.9 and within 180 days    HB A1C (BAYER DCA - WAIVED)  Date Value Ref Range Status  08/28/2020 7.1 (H) <7.0 % Final    Comment:                                          Diabetic Adult            <7.0                                       Healthy Adult        4.3 - 5.7                                                           (DCCT/NGSP) American Diabetes Association's Summary of Glycemic Recommendations for Adults with Diabetes: Hemoglobin A1c <7.0%. More stringent glycemic goals (A1c <6.0%) may further reduce complications at the cost of increased risk of hypoglycemia.          Passed - eGFR in normal range and within 360 days    GFR calc Af Amer  Date Value Ref Range Status  10/10/2019 >60 >60 mL/min Final   GFR, Estimated  Date Value Ref Range Status  08/20/2020 >60 >60 mL/min Final    Comment:    (NOTE) Calculated using the CKD-EPI Creatinine Equation (2021)    eGFR  Date Value Ref Range Status  08/28/2020 106 >59 mL/min/1.73 Final         Passed - Valid encounter within last 6 months    Recent Outpatient Visits          1 month ago Hypercholesterolemia   [facility] Vigg, Avanti, MD   2 months ago Hepatocellular carcinoma ([place])   [facility] Vigg, Avanti, MD   2 months ago Screen for colon cancer   [facility] Vigg, Loman [place], MD      Future Appointments            In 2 weeks Hollice Espy, MD [place]   In 3 weeks Ralene Bathe, MD [place]   In 1 month Vigg, Avanti, MD [facility], [place]

## 2020-11-14 DIAGNOSIS — R200 Anesthesia of skin: Secondary | ICD-10-CM | POA: Diagnosis not present

## 2020-11-21 DIAGNOSIS — R2689 Other abnormalities of gait and mobility: Secondary | ICD-10-CM | POA: Diagnosis not present

## 2020-11-21 DIAGNOSIS — R42 Dizziness and giddiness: Secondary | ICD-10-CM | POA: Diagnosis not present

## 2020-11-21 DIAGNOSIS — R200 Anesthesia of skin: Secondary | ICD-10-CM | POA: Diagnosis not present

## 2020-11-21 DIAGNOSIS — Z8673 Personal history of transient ischemic attack (TIA), and cerebral infarction without residual deficits: Secondary | ICD-10-CM | POA: Diagnosis not present

## 2020-11-25 NOTE — Progress Notes (Signed)
11/27/20 10:07 AM   Johnny Kelley [birth year] [id number]  Referring provider:  Charlynne Cousins, MD [address] [place],  [place] [address] Chief Complaint  Patient presents with   Benign Prostatic Hypertrophy     HPI: Johnny Kelley is a 55 y.o.male who returns today for 3 month follow-up with IPSS and PVR.   He has a history of genital warts which has been ongoing since his 28s and affecting his foreskin. He was recently seen for his genital warts on 10/23/2020 by Dr. Dot Lanes. Dr. Francesca Jewett states for surgical intervention he will need to make a few lifestyle changes and get his comorbidities situated. He states today he will not have reconstruction of his genital warts with Dr. Francesca Jewett.   He reports that he is been dealing with this issue for the last 30 years and does not think it an issue or related to any of his symptoms or urethral concerns.  He also has BPH with urinary obstruction and was put on a trial of Flomax 0.4 mg. He states he has been taking Flomax since last visit and he has been experiencing improvement in emptying and flow . He has ben experiencing dizziness. He has been experiencing pus. He feels that he personally needs antibiotics.   UA today showed 6-10 WBC 1+ glucose, non bacteria and nitite negative.    IPSS     Row Name 11/27/20 0900         International Prostate Symptom Score   How often have you had the sensation of not emptying your bladder? More than half the time     How often have you had to urinate less than every two hours? About half the time     How often have you found you stopped and started again several times when you urinated? Almost always     How often have you found it difficult to postpone urination? Less than 1 in 5 times     How often have you had a weak urinary stream? Almost always     How often have you had to strain to start urination? About half the time     How many times did you typically get up at night to urinate? 3  Times     Total IPSS Score 24           Quality of Life due to urinary symptoms   If you were to spend the rest of your life with your urinary condition just the way it is now how would you feel about that? Terrible              Score:  1-7 Mild 8-19 Moderate 20-35 Severe  PMH: Past Medical History:  Diagnosis Date   Cancer, hepatocellular ([place])    Diabetes mellitus without complication ([place])    Hepatitis C    Hypertension    Psoriasis    Stroke ([place])    Hx stroke in 2020   Substance abuse [facility])     Surgical History: Past Surgical History:  Procedure Laterality Date   IR RADIOLOGIST EVAL & MGMT  01/19/2020   IR RADIOLOGIST EVAL & MGMT  01/31/2020   IR RADIOLOGIST EVAL & MGMT  04/12/2020   IR RADIOLOGIST EVAL & MGMT  08/30/2020   KNEE SURGERY Right     Home Medications:  Allergies as of 11/27/2020       Reactions   Amlodipine Swelling        Medication List  Accurate as of November 27, 2020 10:07 AM. If you have any questions, ask your nurse or doctor.          aspirin 325 MG tablet Take 1 tablet (325 mg total) by mouth daily.   blood glucose meter kit and supplies Kit Dispense based on patient and insurance preference. Use up to four times daily as directed. (FOR ICD-9 250.00, 250.01).   empagliflozin 10 MG Tabs tablet Commonly known as: Jardiance Take 1 tablet (10 mg total) by mouth daily before breakfast.   famotidine 20 MG tablet Commonly known as: PEPCID Take 1 tablet (20 mg total) by mouth at bedtime.   gabapentin 100 MG capsule Commonly known as: NEURONTIN Take 1 capsule (100 mg total) by mouth every evening.   glimepiride 2 MG tablet Commonly known as: AMARYL Take 2 mg by mouth every morning.   lidocaine 5 % Commonly known as: Lidoderm Place 1 patch onto the skin daily. Remove & Discard patch within 12 hours or as directed by MD   losartan 50 MG tablet Commonly known as: COZAAR Take 1 tablet (50 mg total) by mouth daily.    meloxicam 7.5 MG tablet Commonly known as: MOBIC Take 7.5 mg by mouth daily.   metFORMIN 1000 MG tablet Commonly known as: GLUCOPHAGE TAKE 1 TABLET (1,000 MG TOTAL) BY MOUTH 2 (TWO) TIMES DAILY WITH A MEAL.   mupirocin ointment 2 % Commonly known as: BACTROBAN Apply 1 application topically daily. With dressing changes   nicotine 14 mg/24hr patch Commonly known as: NICODERM CQ - dosed in mg/24 hours Place 1 patch (14 mg total) onto the skin daily.   tamsulosin 0.4 MG Caps capsule Commonly known as: FLOMAX Take 1 capsule (0.4 mg total) by mouth daily.        Allergies:  Allergies  Allergen Reactions   Amlodipine Swelling    Family History: Family History  Problem Relation Age of Onset   Colon cancer Father    Diabetes Father    Diabetes Mother    Kidney disease Maternal Grandmother    Lung cancer Maternal Grandfather    Hyperlipidemia Paternal Grandmother    COPD Paternal Grandfather     Social History:  reports that he has been smoking cigarettes. He has a 9.50 pack-year smoking history. He has never used smokeless tobacco. He reports current alcohol use of about 21.0 standard drinks per week. He reports current drug use. Drugs: Cocaine and Marijuana.   Physical Exam: BP (!) 155/82   Pulse 75   Ht _0  (1.88 m)   Wt 210 lb (95.3 kg)   BMI 26.96 kg/m   Constitutional:  Alert and oriented, No acute distress. HEENT: [place] AT, moist mucus membranes.  Trachea midline, no masses. Cardiovascular: No clubbing, cyanosis, or edema. Respiratory: Normal respiratory effort, no increased work of breathing. Skin: No rashes, bruises or suspicious lesions. Neurologic: Grossly intact, no focal deficits, moving all 4 extremities. Psychiatric: Normal mood and affect.  Laboratory Data:  Lab Results  Component Value Date   CREATININE 0.73 (L) 08/28/2020    Lab Results  Component Value Date   HGBA1C 7.1 (H) 08/28/2020   PVR today minimal     Assessment & Plan:    BPH w urinary frequency - Has experienced improvement of urinary symptoms on Flomax -will continue this medication - IPSS today 24; appears to be emptying well.  Although his scores fairly bad today, in discussion, he seen a dramatic improvement and is satisfied.  2. Penile discharge  -  Expressed on-going concern for penile discharge  - STD testing today (chlamydia, gonorrhea, HIV, syphilis) - Urinalysis today 6-10 WBC 1+ glucose, non bacteria and nitite negative will send a culture but does not look infected   3. Genital warts  - Was seen for this at [facility] and has declined care for this   F/u 1 year with IPSS/ PVR  I,Kailey Littlejohn,acting as a scribe for Hollice Espy, MD.,have documented all relevant documentation on the behalf of Hollice Espy, MD,as directed by  Hollice Espy, MD while in the presence of Hollice Espy, MD.  I have reviewed the above documentation for accuracy and completeness, and I agree with the above.   Hollice Espy, MD   [facility] [address], [place], [place] [address] [phone]

## 2020-11-27 ENCOUNTER — Ambulatory Visit (INDEPENDENT_AMBULATORY_CARE_PROVIDER_SITE_OTHER): Payer: Medicaid Other | Admitting: Urology

## 2020-11-27 ENCOUNTER — Other Ambulatory Visit: Payer: Self-pay

## 2020-11-27 VITALS — BP 155/82 | HR 75 | Ht 74.0 in | Wt 210.0 lb

## 2020-11-27 DIAGNOSIS — N401 Enlarged prostate with lower urinary tract symptoms: Secondary | ICD-10-CM

## 2020-11-27 DIAGNOSIS — N138 Other obstructive and reflux uropathy: Secondary | ICD-10-CM | POA: Diagnosis not present

## 2020-11-27 DIAGNOSIS — R369 Urethral discharge, unspecified: Secondary | ICD-10-CM | POA: Diagnosis not present

## 2020-11-27 LAB — URINALYSIS, COMPLETE
Bilirubin, UA: NEGATIVE
Ketones, UA: NEGATIVE
Leukocytes,UA: NEGATIVE
Nitrite, UA: NEGATIVE
Protein,UA: NEGATIVE
Specific Gravity, UA: 1.025 (ref 1.005–1.030)
Urobilinogen, Ur: 1 mg/dL (ref 0.2–1.0)
pH, UA: 5 (ref 5.0–7.5)

## 2020-11-27 LAB — MICROSCOPIC EXAMINATION: Bacteria, UA: NONE SEEN

## 2020-11-27 LAB — BLADDER SCAN AMB NON-IMAGING: Scan Result: 51

## 2020-11-28 DIAGNOSIS — M5116 Intervertebral disc disorders with radiculopathy, lumbar region: Secondary | ICD-10-CM | POA: Diagnosis not present

## 2020-11-28 DIAGNOSIS — M48061 Spinal stenosis, lumbar region without neurogenic claudication: Secondary | ICD-10-CM | POA: Diagnosis not present

## 2020-11-28 DIAGNOSIS — M5416 Radiculopathy, lumbar region: Secondary | ICD-10-CM | POA: Diagnosis not present

## 2020-11-28 DIAGNOSIS — M4726 Other spondylosis with radiculopathy, lumbar region: Secondary | ICD-10-CM | POA: Diagnosis not present

## 2020-11-28 LAB — HIV ANTIBODY (ROUTINE TESTING W REFLEX): HIV Screen 4th Generation wRfx: NONREACTIVE

## 2020-11-28 LAB — RPR: RPR Ser Ql: NONREACTIVE

## 2020-11-29 ENCOUNTER — Telehealth: Payer: Self-pay | Admitting: *Deleted

## 2020-11-29 NOTE — Telephone Encounter (Addendum)
Patient aware, voiced understanding  ----- Message from Hollice Espy, MD sent at 11/28/2020  7:31 AM EDT ----- HIV and syphilis test are negative, others are still pending.  Hollice Espy, MD

## 2020-11-30 DIAGNOSIS — M545 Low back pain, unspecified: Secondary | ICD-10-CM | POA: Diagnosis not present

## 2020-11-30 LAB — GC/CHLAMYDIA PROBE AMP
Chlamydia trachomatis, NAA: NEGATIVE
Neisseria Gonorrhoeae by PCR: NEGATIVE

## 2020-11-30 LAB — CULTURE, URINE COMPREHENSIVE

## 2020-12-03 ENCOUNTER — Other Ambulatory Visit: Payer: Self-pay | Admitting: Internal Medicine

## 2020-12-04 DIAGNOSIS — M545 Low back pain, unspecified: Secondary | ICD-10-CM | POA: Diagnosis not present

## 2020-12-04 NOTE — Telephone Encounter (Signed)
Requested Prescriptions  Pending Prescriptions Disp Refills  . JARDIANCE 10 MG TABS tablet [Pharmacy Med Name: JARDIANCE 10 MG TABLET] 30 tablet 2    Sig: TAKE 1 TABLET BY MOUTH DAILY BEFORE BREAKFAST.     Endocrinology:  Diabetes - SGLT2 Inhibitors Failed - 12/03/2020 12:39 PM      Failed - Cr in normal range and within 360 days    Creatinine, Ser  Date Value Ref Range Status  08/28/2020 0.73 (L) 0.76 - 1.27 mg/dL Final         Passed - LDL in normal range and within 360 days    LDL Chol Calc (NIH)  Date Value Ref Range Status  08/28/2020 66 0 - 99 mg/dL Final         Passed - HBA1C is between 0 and 7.9 and within 180 days    HB A1C (BAYER DCA - WAIVED)  Date Value Ref Range Status  08/28/2020 7.1 (H) <7.0 % Final    Comment:                                          Diabetic Adult            <7.0                                       Healthy Adult        4.3 - 5.7                                                           (DCCT/NGSP) American Diabetes Association's Summary of Glycemic Recommendations for Adults with Diabetes: Hemoglobin A1c <7.0%. More stringent glycemic goals (A1c <6.0%) may further reduce complications at the cost of increased risk of hypoglycemia.          Passed - eGFR in normal range and within 360 days    GFR calc Af Amer  Date Value Ref Range Status  10/10/2019 >60 >60 mL/min Final   GFR, Estimated  Date Value Ref Range Status  08/20/2020 >60 >60 mL/min Final    Comment:    (NOTE) Calculated using the CKD-EPI Creatinine Equation (2021)    eGFR  Date Value Ref Range Status  08/28/2020 106 >59 mL/min/1.73 Final         Passed - Valid encounter within last 6 months    Recent Outpatient Visits          2 months ago Hypercholesterolemia   [facility] Vigg, Avanti, MD   3 months ago Hepatocellular carcinoma ([place])   [facility] Vigg, Avanti, MD   3 months ago Screen for colon cancer   [facility] Vigg,  Avanti, MD      Future Appointments            Tomorrow Ralene Bathe, MD [place]   In 2 weeks Vigg, Avanti, MD [facility], [place]   In 12 months Hollice Espy, MD [facility]

## 2020-12-05 ENCOUNTER — Ambulatory Visit: Payer: Medicaid Other | Admitting: Dermatology

## 2020-12-05 ENCOUNTER — Other Ambulatory Visit: Payer: Self-pay

## 2020-12-05 DIAGNOSIS — B353 Tinea pedis: Secondary | ICD-10-CM | POA: Diagnosis not present

## 2020-12-05 DIAGNOSIS — B351 Tinea unguium: Secondary | ICD-10-CM

## 2020-12-05 DIAGNOSIS — L409 Psoriasis, unspecified: Secondary | ICD-10-CM | POA: Diagnosis not present

## 2020-12-05 MED ORDER — TERBINAFINE HCL 250 MG PO TABS: 250.0000 mg | ORAL_TABLET | Freq: Every day | ORAL | 0 refills | Status: DC

## 2020-12-05 NOTE — Progress Notes (Signed)
   Follow-Up Visit   Subjective  Johnny Kelley is a 55 y.o. male who presents for the following: Skin Problem (Pt c/o red blotches and his arm and knees 1 month ago, now better scabs remaining) and Nail Problem (Pt c/o nail problem on the fingernails and toenails ). Pt was recently diagnosed with liver cancer and says it was a very small lesion in his liver and has not affected his liver function.  He has decided not to get treatment because he felt he did not get good care at the oncologist.  The following portions of the chart were reviewed this encounter and updated as appropriate:   Tobacco  Allergies  Meds  Problems  Med Hx  Surg Hx  Fam Hx     Review of Systems:  No other skin or systemic complaints except as noted in HPI or Assessment and Plan.  Objective  Well appearing patient in no apparent distress; mood and affect are within normal limits.  A focused examination was performed including hands,feet, arms, knees. Relevant physical exam findings are noted in the Assessment and Plan.  fingers and feet Scaling and maceration web spaces and over distal and lateral soles.   arms,legs Well-demarcated erythematous papules/plaques with silvery scale    Assessment & Plan  Tinea pedis and tinea unguium of the hands and feet. (Changes most consistent with fungal, but some of the nail changes may be related to psoriasis.) fingers and feet Tinea pedis/tinea unguium  Chronic and persistent   Start Lamisil 250 mg take 1 tablet daily   Terbinafine Counseling  Terbinafine is an anti-fungal medicine that can be applied to the skin (over the counter) or taken by mouth (prescription) to treat fungal infections. The pill version is often used to treat fungal infections of the nails or scalp. While most people do not have any side effects from taking terbinafine pills, some possible side effects of the medicine can include taste changes, headache, loss of smell, vision changes,  nausea, vomiting, or diarrhea.   Rare side effects can include irritation of the liver, allergic reaction, or decrease in blood counts (which may show up as not feeling well or developing an infection). If you are concerned about any of these side effects, please stop the medicine and call your doctor, or in the case of an emergency such as feeling very unwell, seek immediate medical care.    Labs reviews WNL   Related Medications terbinafine (LAMISIL) 250 MG tablet Take 1 tablet (250 mg total) by mouth daily.  Psoriasis arms,legs Psoriasis is a chronic non-curable, but treatable genetic/hereditary disease that may have other systemic features affecting other organ systems such as joints (Psoriatic Arthritis). It is associated with an increased risk of inflammatory bowel disease, heart disease, non-alcoholic fatty liver disease, and depression.     Will reevaluate psoriasis and discuss treatment after finishing fungal treatment.  Return in about 5 weeks (around 01/09/2021) for Tinea Pedis, Tinea unguium .  IMarye Round, CMA, am acting as scribe for Sarina Ser, MD .  Documentation: I have reviewed the above documentation for accuracy and completeness, and I agree with the above.  Sarina Ser, MD

## 2020-12-05 NOTE — Patient Instructions (Addendum)
Terbinafine Counseling  Terbinafine is an anti-fungal medicine that can be applied to the skin (over the counter) or taken by mouth (prescription) to treat fungal infections. The pill version is often used to treat fungal infections of the nails or scalp. While most people do not have any side effects from taking terbinafine pills, some possible side effects of the medicine can include taste changes, headache, loss of smell, vision changes, nausea, vomiting, or diarrhea.   Rare side effects can include irritation of the liver, allergic reaction, or decrease in blood counts (which may show up as not feeling well or developing an infection). If you are concerned about any of these side effects, please stop the medicine and call your doctor, or in the case of an emergency such as feeling very unwell, seek immediate medical care.    If you have any questions or concerns for your doctor, please call our main line at [phone] and press option 4 to reach your doctor's medical assistant. If no one answers, please leave a voicemail as directed and we will return your call as soon as possible. Messages left after 4 pm will be answered the following business day.   You may also send us a message via MyChart. We typically respond to MyChart messages within 1-2 business days.  For prescription refills, please ask your pharmacy to contact our office. Our fax number is [phone].  If you have an urgent issue when the clinic is closed that cannot wait until the next business day, you can page your doctor at the number below.    Please note that while we do our best to be available for urgent issues outside of office hours, we are not available 24/7.   If you have an urgent issue and are unable to reach us, you may choose to seek medical care at your doctor's office, retail clinic, urgent care center, or emergency room.  If you have a medical emergency, please immediately call 911 or go to the emergency  department.  Pager Numbers  - Dr. Kowalski: [phone]  - Dr. Moye: [phone]  - Dr. Stewart: [phone]  In the event of inclement weather, please call our main line at [phone] for an update on the status of any delays or closures.  Dermatology Medication Tips: Please keep the boxes that topical medications come in in order to help keep track of the instructions about where and how to use these. Pharmacies typically print the medication instructions only on the boxes and not directly on the medication tubes.   If your medication is too expensive, please contact our office at [phone] option 4 or send us a message through MyChart.   We are unable to tell what your co-pay for medications will be in advance as this is different depending on your insurance coverage. However, we may be able to find a substitute medication at lower cost or fill out paperwork to get insurance to cover a needed medication.   If a prior authorization is required to get your medication covered by your insurance company, please allow us 1-2 business days to complete this process.  Drug prices often vary depending on where the prescription is filled and some pharmacies may offer cheaper prices.  The website www.goodrx.com contains coupons for medications through different pharmacies. The prices here do not account for what the cost may be with help from insurance (it may be cheaper with your insurance), but the website can give you the price if you did not   use any insurance.  - You can print the associated coupon and take it with your prescription to the pharmacy.  - You may also stop by our office during regular business hours and pick up a GoodRx coupon card.  - If you need your prescription sent electronically to a different pharmacy, notify our office through [facility] MyChart or by phone at [phone] option 4.  

## 2020-12-06 ENCOUNTER — Encounter: Payer: Self-pay | Admitting: Dermatology

## 2020-12-06 DIAGNOSIS — M545 Low back pain, unspecified: Secondary | ICD-10-CM | POA: Diagnosis not present

## 2020-12-13 ENCOUNTER — Ambulatory Visit
Admission: RE | Admit: 2020-12-13 | Discharge: 2020-12-13 | Disposition: A | Discharge status: Home / Self Care | Payer: Medicaid Other | Source: Ambulatory Visit | Attending: Oncology | Admitting: Oncology

## 2020-12-13 ENCOUNTER — Other Ambulatory Visit: Payer: Self-pay

## 2020-12-13 DIAGNOSIS — I700 Atherosclerosis of aorta: Secondary | ICD-10-CM | POA: Diagnosis not present

## 2020-12-13 DIAGNOSIS — C220 Liver cell carcinoma: Secondary | ICD-10-CM | POA: Insufficient documentation

## 2020-12-13 DIAGNOSIS — K7460 Unspecified cirrhosis of liver: Secondary | ICD-10-CM | POA: Diagnosis not present

## 2020-12-13 DIAGNOSIS — C229 Malignant neoplasm of liver, not specified as primary or secondary: Secondary | ICD-10-CM | POA: Diagnosis not present

## 2020-12-13 DIAGNOSIS — K7689 Other specified diseases of liver: Secondary | ICD-10-CM | POA: Diagnosis not present

## 2020-12-13 MED ORDER — GADOBUTROL 1 MMOL/ML IV SOLN
10.0000 mL | Freq: Once | INTRAVENOUS | Status: AC | PRN
  Administered 2020-12-13: 10 mL via INTRAVENOUS

## 2020-12-14 DIAGNOSIS — M545 Low back pain, unspecified: Secondary | ICD-10-CM | POA: Diagnosis not present

## 2020-12-18 ENCOUNTER — Encounter: Payer: Self-pay | Admitting: Oncology

## 2020-12-18 ENCOUNTER — Inpatient Hospital Stay (HOSPITAL_BASED_OUTPATIENT_CLINIC_OR_DEPARTMENT_OTHER): Payer: Medicaid Other | Admitting: Oncology

## 2020-12-18 ENCOUNTER — Inpatient Hospital Stay: Payer: Medicaid Other | Attending: Oncology

## 2020-12-18 ENCOUNTER — Other Ambulatory Visit: Payer: Self-pay

## 2020-12-18 VITALS — BP 143/87 | HR 80 | Temp 98.1°F | Resp 16 | Wt 209.0 lb

## 2020-12-18 DIAGNOSIS — F1290 Cannabis use, unspecified, uncomplicated: Secondary | ICD-10-CM | POA: Diagnosis not present

## 2020-12-18 DIAGNOSIS — F1490 Cocaine use, unspecified, uncomplicated: Secondary | ICD-10-CM | POA: Insufficient documentation

## 2020-12-18 DIAGNOSIS — Z800 Family history of malignant neoplasm of digestive organs: Secondary | ICD-10-CM | POA: Diagnosis not present

## 2020-12-18 DIAGNOSIS — B1920 Unspecified viral hepatitis C without hepatic coma: Secondary | ICD-10-CM | POA: Diagnosis not present

## 2020-12-18 DIAGNOSIS — C220 Liver cell carcinoma: Secondary | ICD-10-CM | POA: Diagnosis not present

## 2020-12-18 DIAGNOSIS — Z7189 Other specified counseling: Secondary | ICD-10-CM

## 2020-12-18 DIAGNOSIS — F17210 Nicotine dependence, cigarettes, uncomplicated: Secondary | ICD-10-CM | POA: Insufficient documentation

## 2020-12-18 DIAGNOSIS — Z801 Family history of malignant neoplasm of trachea, bronchus and lung: Secondary | ICD-10-CM | POA: Diagnosis not present

## 2020-12-18 LAB — CBC WITH DIFFERENTIAL/PLATELET
Abs Immature Granulocytes: 0.01 10*3/uL (ref 0.00–0.07)
Basophils Absolute: 0 10*3/uL (ref 0.0–0.1)
Basophils Relative: 1 %
Eosinophils Absolute: 0.1 10*3/uL (ref 0.0–0.5)
Eosinophils Relative: 2 %
HCT: 47.9 % (ref 39.0–52.0)
Hemoglobin: 16.1 g/dL (ref 13.0–17.0)
Immature Granulocytes: 0 %
Lymphocytes Relative: 27 %
Lymphs Abs: 1.4 10*3/uL (ref 0.7–4.0)
MCH: 33.3 pg (ref 26.0–34.0)
MCHC: 33.6 g/dL (ref 30.0–36.0)
MCV: 99.2 fL (ref 80.0–100.0)
Monocytes Absolute: 0.7 10*3/uL (ref 0.1–1.0)
Monocytes Relative: 13 %
Neutro Abs: 2.9 10*3/uL (ref 1.7–7.7)
Neutrophils Relative %: 57 %
Platelets: 80 10*3/uL — ABNORMAL LOW (ref 150–400)
RBC: 4.83 MIL/uL (ref 4.22–5.81)
RDW: 13.4 % (ref 11.5–15.5)
WBC: 5 10*3/uL (ref 4.0–10.5)
nRBC: 0 % (ref 0.0–0.2)

## 2020-12-18 LAB — COMPREHENSIVE METABOLIC PANEL
ALT: 45 U/L — ABNORMAL HIGH (ref 0–44)
AST: 63 U/L — ABNORMAL HIGH (ref 15–41)
Albumin: 3.8 g/dL (ref 3.5–5.0)
Alkaline Phosphatase: 95 U/L (ref 38–126)
Anion gap: 9 (ref 5–15)
BUN: 12 mg/dL (ref 6–20)
CO2: 22 mmol/L (ref 22–32)
Calcium: 9 mg/dL (ref 8.9–10.3)
Chloride: 104 mmol/L (ref 98–111)
Creatinine, Ser: 0.86 mg/dL (ref 0.61–1.24)
GFR, Estimated: >60 mL/min (ref >60)
Glucose, Bld: 225 mg/dL — ABNORMAL HIGH (ref 70–99)
Potassium: 4.6 mmol/L (ref 3.5–5.1)
Sodium: 135 mmol/L (ref 135–145)
Total Bilirubin: 2.8 mg/dL — ABNORMAL HIGH (ref 0.3–1.2)
Total Protein: 7.8 g/dL (ref 6.5–8.1)

## 2020-12-18 NOTE — Progress Notes (Signed)
Pt and caregiver in for follow up, denies any concerns today. 

## 2020-12-18 NOTE — Progress Notes (Signed)
Hematology/Oncology Consult note [facility]  Telephone:([phone] [phone]  Patient Care Team: Charlynne Cousins, MD as PCP - General (Internal Medicine) Clent Jacks, RN as Oncology Nurse Navigator   Name of the patient: Johnny Kelley  [id number]  [birth year]   Date of visit: 12/18/20  Diagnosis-nonmetastatic hepatocellular carcinoma  Chief complaint/ Reason for visit-discuss MRI results and further management  Heme/Onc history: Patient is a 55 year old male with past medical history significant for newly diagnosed hepatitis C infection as well as significant alcohol intake.  He was noted to have abnormal LFTs.  In July 2021 he was found to have an elevated bilirubin of 1.9 with an AST ALT of 128 and 86 respectively and underwent a right upper quadrant ultrasound which showed a heterogeneous abnormality in the right hepatic lobe measuring 4.1 x 4.1 x 3.5 cm.  He was then seen by Dr. Allen Norris and underwent MRI liver with and without contrast which showed a 3.8 cm enhancing lesion in segment 6 compatible with [place] lie RADS 5.  He also underwent work-up for abnormal LFTs and was found to have hepatitis C and will be starting treatment for that soon.  He has been referred to [place] for further evaluation of liver mass.   Results of blood work from 01/13/2020 reveal a normal albumin level of 3.5.  AST and ALT mildly elevated at 70 and 51 respectively with a total bilirubin of 1.6.  PT/INR was normal.  Platelet count was mildly low at 105.  CT chest with contrast did not reveal any evidence of metastatic disease  Interval history-patient was supposed to go for thermal ablation in June 2022.  He was seen by IR but at that time he was noted to have diffuse rash involving his upper extremities and groin. Groin was considered access point for the procedure and given the rash procedure was deferred to another date.  Patient was upset after this and decided not to  follow-up with [facility] interventional radiology for any further appointments in the future.  Presently reports that his rash is better.  He thinks it could have been poison ivy.  Patient also has longstanding history of genital warts for which she was seen by urology and also referred to [facility] urology.  [facility] recommended extensive surgery with reconstruction which the patient does not wish to pursue for his warts.  He continues to drink alcohol although he is trying to cut down and there are days when he goes without drinking.  ECOG PS- 1 Pain scale- 0   Review of systems- Review of Systems  Constitutional:  Positive for malaise/fatigue.     Allergies  Allergen Reactions   Amlodipine Swelling     Past Medical History:  Diagnosis Date   Cancer, hepatocellular ([place])    Diabetes mellitus without complication ([place])    Hepatitis C    Hypertension    Psoriasis    Stroke ([place])    Hx stroke in 2020   Substance abuse [facility])      Past Surgical History:  Procedure Laterality Date   IR RADIOLOGIST EVAL & MGMT  01/19/2020   IR RADIOLOGIST EVAL & MGMT  01/31/2020   IR RADIOLOGIST EVAL & MGMT  04/12/2020   IR RADIOLOGIST EVAL & MGMT  08/30/2020   KNEE SURGERY Right     Social History   Socioeconomic History   Marital status: Widowed    Spouse name: Not on file   Number of children: Not on file  Years of education: Not on file   Highest education level: Not on file  Occupational History   Occupation: unemployed  Tobacco Use   Smoking status: Every Day    Packs/day: 0.25    Years: 38.00    Pack years: 9.50    Types: Cigarettes   Smokeless tobacco: Never  Vaping Use   Vaping Use: Never used  Substance and Sexual Activity   Alcohol use: Yes    Alcohol/week: 21.0 standard drinks    Types: 21 Cans of beer per week   Drug use: Yes    Types: Cocaine, Marijuana    Comment: reports marijuana use once per month and cocaine use 2-3 times per month   Sexual activity: Not on file   Other Topics Concern   Not on file  Social History Narrative   Not on file   Social Determinants of Health   Financial Resource Strain: Not on file  Food Insecurity: Not on file  Transportation Needs: Not on file  Physical Activity: Not on file  Stress: Not on file  Social Connections: Not on file  Intimate Partner Violence: Not on file    Family History  Problem Relation Age of Onset   Colon cancer Father    Diabetes Father    Diabetes Mother    Kidney disease Maternal Grandmother    Lung cancer Maternal Grandfather    Hyperlipidemia Paternal Grandmother    COPD Paternal Grandfather      Current Outpatient Medications:    blood glucose meter kit and supplies KIT, Dispense based on patient and insurance preference. Use up to four times daily as directed. (FOR ICD-9 250.00, 250.01)., Disp: 1 each, Rfl: 0   famotidine (PEPCID) 20 MG tablet, Take 1 tablet (20 mg total) by mouth at bedtime., Disp: 30 tablet, Rfl: 3   gabapentin (NEURONTIN) 100 MG capsule, Take 1 capsule (100 mg total) by mouth every evening., Disp: 30 capsule, Rfl: 3   glimepiride (AMARYL) 2 MG tablet, Take 2 mg by mouth every morning., Disp: , Rfl:    JARDIANCE 10 MG TABS tablet, TAKE 1 TABLET BY MOUTH DAILY BEFORE BREAKFAST., Disp: 30 tablet, Rfl: 2   lidocaine (LIDODERM) 5 %, Place 1 patch onto the skin daily. Remove & Discard patch within 12 hours or as directed by MD, Disp: 30 patch, Rfl: 3   losartan (COZAAR) 50 MG tablet, Take 1 tablet (50 mg total) by mouth daily., Disp: 90 tablet, Rfl: 2   meloxicam (MOBIC) 7.5 MG tablet, Take 7.5 mg by mouth daily., Disp: , Rfl:    metFORMIN (GLUCOPHAGE) 1000 MG tablet, TAKE 1 TABLET (1,000 MG TOTAL) BY MOUTH 2 (TWO) TIMES DAILY WITH A MEAL., Disp: 60 tablet, Rfl: 2   mupirocin ointment (BACTROBAN) 2 %, Apply 1 application topically daily. With dressing changes, Disp: 22 g, Rfl: 0   tamsulosin (FLOMAX) 0.4 MG CAPS capsule, Take 1 capsule (0.4 mg total) by mouth daily.,  Disp: 30 capsule, Rfl: 11   terbinafine (LAMISIL) 250 MG tablet, Take 1 tablet (250 mg total) by mouth daily., Disp: 30 tablet, Rfl: 0   aspirin 325 MG tablet, Take 1 tablet (325 mg total) by mouth daily. (Patient not taking: Reported on 12/18/2020), Disp: 120 tablet, Rfl: 0   nicotine (NICODERM CQ - DOSED IN MG/24 HOURS) 14 mg/24hr patch, Place 1 patch (14 mg total) onto the skin daily. (Patient not taking: Reported on 12/18/2020), Disp: 28 patch, Rfl: 1  Physical exam:  Vitals:   12/18/20 1118  BP: (!) 143/87  Pulse: 80  Resp: 16  Temp: 98.1 F (36.7 C)  TempSrc: Tympanic  SpO2: 97%  Weight: 209 lb (94.8 kg)   Physical Exam Constitutional:      Comments: Mild scleral icterus  Cardiovascular:     Rate and Rhythm: Normal rate and regular rhythm.     Heart sounds: Normal heart sounds.  Pulmonary:     Effort: Pulmonary effort is normal.     Breath sounds: Normal breath sounds.  Abdominal:     General: Bowel sounds are normal.     Palpations: Abdomen is soft.  Skin:    General: Skin is warm and dry.     Comments: Mild bruising noted over bilateral forearms.  No overt rash seen  Neurological:     Mental Status: He is alert and oriented to person, place, and time.     CMP Latest Ref Rng & Units 12/18/2020  Glucose 70 - 99 mg/dL 225(H)  BUN 6 - 20 mg/dL 12  Creatinine 0.61 - 1.24 mg/dL 0.86  Sodium 135 - 145 mmol/L 135  Potassium 3.5 - 5.1 mmol/L 4.6  Chloride 98 - 111 mmol/L 104  CO2 22 - 32 mmol/L 22  Calcium 8.9 - 10.3 mg/dL 9.0  Total Protein 6.5 - 8.1 g/dL 7.8  Total Bilirubin 0.3 - 1.2 mg/dL 2.8(H)  Alkaline Phos 38 - 126 U/L 95  AST 15 - 41 U/L 63(H)  ALT 0 - 44 U/L 45(H)   CBC Latest Ref Rng & Units 12/18/2020  WBC 4.0 - 10.5 K/uL 5.0  Hemoglobin 13.0 - 17.0 g/dL 16.1  Hematocrit 39.0 - 52.0 % 47.9  Platelets 150 - 400 K/uL 80(L)    No images are attached to the encounter.  MR Abdomen W Wo Contrast  Result Date: 12/13/2020 CLINICAL DATA:  Liver cancer,  embolization planned in June was deferred EXAM: MRI ABDOMEN WITHOUT AND WITH CONTRAST TECHNIQUE: Multiplanar multisequence MR imaging of the abdomen was performed both before and after the administration of intravenous contrast. CONTRAST:  76m GADAVIST GADOBUTROL 1 MMOL/ML IV SOLN COMPARISON:  08/16/2020 FINDINGS: Lower chest: No acute findings. Hepatobiliary: Coarse, nodular, cirrhotic morphology of the liver. A mass in the posterior inferior right lobe of the liver, hepatic segment VI, demonstrates slight T1 hyperintensity (series 12, image 39). There is unchanged arterial hyperenhancement (series 13, image 39) as well as washout and capsular enhancement (series 15, image 39). Arterially hyperenhancing portion is unchanged in size measuring 4.1 x 3.5 cm (series 13, image 38). Unchanged subcentimeter focus of arterial hyperenhancement in the posterior liver dome, hepatic segment VII (series 13, image 29). Pancreas: No mass, inflammatory changes, or other parenchymal abnormality identified. Spleen:  Mild splenomegaly, maximum coronal span 13.7 cm. Adrenals/Urinary Tract: No masses identified. No evidence of hydronephrosis. Stomach/Bowel: Visualized portions within the abdomen are unremarkable. Vascular/Lymphatic: No pathologically enlarged lymph nodes identified. No abdominal aortic aneurysm demonstrated. Aortic atherosclerosis. Other:  None. Musculoskeletal: No suspicious bone lesions identified. IMPRESSION: 1. A mass in the posterior inferior right lobe of the liver, hepatic segment VI, is unchanged, measuring 4.1 x 3.5 cm and demonstrating arterial hyperenhancement as well as washout and capsular enhancement. Findings remain consistent with LI-RADS category 5, hepatocellular carcinoma. 2. Unchanged subcentimeter focus of arterial hyperenhancement in the posterior liver dome, hepatic segment VII. This may reflect a dysplastic or regenerative nodule. Attention on follow-up. LI-RADS category 3, intermediate  probability for hepatocellular carcinoma. 3. Cirrhosis with mild splenomegaly. Electronically Signed   By: AEddie Candle  M.D.   On: 12/13/2020 14:59     Assessment and plan- Patient is a 55 y.o. male with nonmetastatic hepatocellular carcinoma and a solitary lesion measuring 3.2 x 3.5 cm in the right hepatic lobe.  He is here to discuss MRI results and further management  Repeat MRI from August 2022 which shows overall stable size of the right hepatic lobe lesion roughly measuring 4.1 x 3.5 cm.  It was reported as LI RADS 5 which is diagnostic of hepatocellular carcinoma.  Overall this has not changed significantly since his prior MRI in May 2022.  There is no evidence of distant metastatic disease noted on his MRI.  Ideally I would like the patient to get liver directed therapy for this if he is eligible.  Unfortunately his procedures have been delayed twice now with interventional radiology at [facility] and patient does not wish to pursue any further appointments with them.  His last procedure was deferred due to presence of groin rash and I explained to the patient that if his rash is better he could still get the procedure done here.  However patient does not wish to go back to radiology at home.  I offered him a second opinion with interventional radiology at [facility] and he is willing to take that option.  If he is not deemed to be a candidate for liver directed therapy I would recommend proceeding with single agent immunotherapy durvalumab which will be given every 2 weeks until progression or toxicity.  Explained to the patient that liver directed therapy is a more defined time-limited approach instead of pursuing systemic therapy which is typically given until progression or toxicity.  Patient understands and would like to hold off on systemic therapy at this time.  I will await second opinion from [facility] interventional radiology at this time before deciding about future MRIs and appointments with me.  I have  again encouraged the patient to quit drinking so his liver functions can improve.  Presently the child Pugh A bordering on B based on his albumin and bilirubin.  His bilirubin fluctuates between normal to 3.   Visit Diagnosis 1. Hepatocellular carcinoma ([place])   2. Goals of care, counseling/discussion      Dr. Randa Evens, MD, MPH [facility] at [facility] [phone] 12/18/2020 4:56 PM

## 2020-12-19 ENCOUNTER — Ambulatory Visit (INDEPENDENT_AMBULATORY_CARE_PROVIDER_SITE_OTHER): Payer: Medicaid Other | Admitting: Internal Medicine

## 2020-12-19 ENCOUNTER — Telehealth: Payer: Self-pay

## 2020-12-19 ENCOUNTER — Other Ambulatory Visit: Payer: Self-pay

## 2020-12-19 ENCOUNTER — Encounter: Payer: Self-pay | Admitting: Internal Medicine

## 2020-12-19 VITALS — BP 150/76 | HR 105 | Temp 98.4°F | Ht 71.9 in | Wt 206.0 lb

## 2020-12-19 DIAGNOSIS — C220 Liver cell carcinoma: Secondary | ICD-10-CM | POA: Diagnosis not present

## 2020-12-19 DIAGNOSIS — E119 Type 2 diabetes mellitus without complications: Secondary | ICD-10-CM

## 2020-12-19 DIAGNOSIS — L409 Psoriasis, unspecified: Secondary | ICD-10-CM | POA: Diagnosis not present

## 2020-12-19 DIAGNOSIS — I10 Essential (primary) hypertension: Secondary | ICD-10-CM

## 2020-12-19 DIAGNOSIS — E7800 Pure hypercholesterolemia, unspecified: Secondary | ICD-10-CM

## 2020-12-19 LAB — AFP TUMOR MARKER: AFP, Serum, Tumor Marker: 4 ng/mL (ref 0.0–8.4)

## 2020-12-19 LAB — BAYER DCA HB A1C WAIVED: HB A1C (BAYER DCA - WAIVED): 6.3 % — ABNORMAL HIGH (ref 4.8–5.6)

## 2020-12-19 NOTE — Telephone Encounter (Signed)
Referral sent to [facility] IR.

## 2020-12-19 NOTE — Progress Notes (Signed)
BP (!) 150/76 (BP Location: Left Arm, Cuff Size: Normal)   Pulse (!) 105   Temp 98.4 F (36.9 C) (Oral)   Ht 5' 11.9" (1.826 m)   Wt 206 lb (93.4 kg)   SpO2 96%   BMI 28.02 kg/m    Subjective:    Patient ID: Johnny Kelley, male    DOB: 24-Mar-1966, 55 y.o.   MRN: [id number]  Chief Complaint  Patient presents with   Diabetes   Hyperlipidemia   Hypertension    HPI: Johnny Kelley is a 55 y.o. male Pt had elevated LFTs and diag with Hep C and HCC sec to hep c and alcohol abuse, sees urology for genital warts which he refused' tx/ penile d/c for which he has been wu with testing for STD's   To have Liver directed therapy vs immune therapy vs systemic chemotherapy , to seek a second opinion from [facility] for Liver directed therpay.  Pt is current jaundiced and c/o nausea today Pt says he feels nauseous today. Has had an elevated bilirubin which fluctuates.   Diabetes He presents for his follow-up (fsbs high at onc office, will need to recheck pt hasnt been checking FSBS at home.) diabetic visit. He has type 2 diabetes mellitus. His disease course has been stable. Associated symptoms include fatigue. Pertinent negatives for diabetes include no chest pain, no foot paresthesias, no polydipsia, no polyphagia and no polyuria.  Hyperlipidemia Pertinent negatives include no chest pain.  Hypertension Pertinent negatives include no chest pain.   Chief Complaint  Patient presents with   Diabetes   Hyperlipidemia   Hypertension    Relevant past medical, surgical, family and social history reviewed and updated as indicated. Interim medical history since our last visit reviewed. Allergies and medications reviewed and updated.  Review of Systems  Constitutional:  Positive for fatigue.  Cardiovascular:  Negative for chest pain.  Gastrointestinal:  Positive for abdominal distention and nausea. Negative for abdominal pain, anal bleeding, blood in stool, constipation and diarrhea.   Endocrine: Negative for polydipsia, polyphagia and polyuria.  Genitourinary:  Positive for penile discharge.  Musculoskeletal: Negative.   Psychiatric/Behavioral: Negative.     Per HPI unless specifically indicated above     Objective:    BP (!) 150/76 (BP Location: Left Arm, Cuff Size: Normal)   Pulse (!) 105   Temp 98.4 F (36.9 C) (Oral)   Ht 5' 11.9" (1.826 m)   Wt 206 lb (93.4 kg)   SpO2 96%   BMI 28.02 kg/m   Wt Readings from Last 3 Encounters:  12/19/20 206 lb (93.4 kg)  12/18/20 209 lb (94.8 kg)  11/27/20 210 lb (95.3 kg)    Physical Exam Vitals and nursing note reviewed.  Constitutional:      General: He is not in acute distress.    Appearance: Normal appearance. He is ill-appearing. He is not diaphoretic.     Comments: Icteric bil eyes appear jaunciced  HENT:     Head: Normocephalic and atraumatic.     Right Ear: Tympanic membrane and external ear normal. There is no impacted cerumen.     Left Ear: External ear normal.     Nose: No congestion or rhinorrhea.     Mouth/Throat:     Pharynx: No oropharyngeal exudate or posterior oropharyngeal erythema.  Eyes:     General: Scleral icterus present.     Conjunctiva/sclera: Conjunctivae normal.     Pupils: Pupils are equal, round, and reactive to light.  Cardiovascular:  Rate and Rhythm: Normal rate and regular rhythm.     Heart sounds: No murmur heard.   No friction rub. No gallop.  Pulmonary:     Effort: No respiratory distress.     Breath sounds: No stridor. No wheezing or rhonchi.  Chest:     Chest wall: No tenderness.  Abdominal:     General: Abdomen is flat. Bowel sounds are normal. There is distension.     Palpations: Abdomen is soft. There is no mass.     Tenderness: There is no abdominal tenderness. There is no right CVA tenderness, left CVA tenderness, guarding or rebound.     Hernia: No hernia is present.     Comments: Mass noted on RUQ no tenderness to palpation  Musculoskeletal:      Cervical back: Normal range of motion and neck supple. No rigidity or tenderness.     Left lower leg: No edema.  Skin:    General: Skin is warm and dry.     Coloration: Skin is not pale.     Findings: No bruising or erythema.  Neurological:     Mental Status: He is alert.  Psychiatric:        Mood and Affect: Mood normal.        Behavior: Behavior normal.        Thought Content: Thought content normal.        Judgment: Judgment normal.    Results for orders placed or performed in visit on 12/18/20  AFP tumor marker  Result Value Ref Range   AFP, Serum, Tumor Marker 4.0 0.0 - 8.4 ng/mL  Comprehensive metabolic panel  Result Value Ref Range   Sodium 135 135 - 145 mmol/L   Potassium 4.6 3.5 - 5.1 mmol/L   Chloride 104 98 - 111 mmol/L   CO2 22 22 - 32 mmol/L   Glucose, Bld 225 (H) 70 - 99 mg/dL   BUN 12 6 - 20 mg/dL   Creatinine, Ser 0.86 0.61 - 1.24 mg/dL   Calcium 9.0 8.9 - 10.3 mg/dL   Total Protein 7.8 6.5 - 8.1 g/dL   Albumin 3.8 3.5 - 5.0 g/dL   AST 63 (H) 15 - 41 U/L   ALT 45 (H) 0 - 44 U/L   Alkaline Phosphatase 95 38 - 126 U/L   Total Bilirubin 2.8 (H) 0.3 - 1.2 mg/dL   GFR, Estimated >60 >60 mL/min   Anion gap 9 5 - 15  CBC with Differential/Platelet  Result Value Ref Range   WBC 5.0 4.0 - 10.5 K/uL   RBC 4.83 4.22 - 5.81 MIL/uL   Hemoglobin 16.1 13.0 - 17.0 g/dL   HCT 47.9 39.0 - 52.0 %   MCV 99.2 80.0 - 100.0 fL   MCH 33.3 26.0 - 34.0 pg   MCHC 33.6 30.0 - 36.0 g/dL   RDW 13.4 11.5 - 15.5 %   Platelets 80 (L) 150 - 400 K/uL   nRBC 0.0 0.0 - 0.2 %   Neutrophils Relative % 57 %   Neutro Abs 2.9 1.7 - 7.7 K/uL   Lymphocytes Relative 27 %   Lymphs Abs 1.4 0.7 - 4.0 K/uL   Monocytes Relative 13 %   Monocytes Absolute 0.7 0.1 - 1.0 K/uL   Eosinophils Relative 2 %   Eosinophils Absolute 0.1 0.0 - 0.5 K/uL   Basophils Relative 1 %   Basophils Absolute 0.0 0.0 - 0.1 K/uL   Immature Granulocytes 0 %   Abs Immature Granulocytes 0.01  0.00 - 0.07 K/uL         Current Outpatient Medications:    aspirin 325 MG tablet, Take 1 tablet (325 mg total) by mouth daily., Disp: 120 tablet, Rfl: 0   blood glucose meter kit and supplies KIT, Dispense based on patient and insurance preference. Use up to four times daily as directed. (FOR ICD-9 250.00, 250.01)., Disp: 1 each, Rfl: 0   famotidine (PEPCID) 20 MG tablet, Take 1 tablet (20 mg total) by mouth at bedtime., Disp: 30 tablet, Rfl: 3   gabapentin (NEURONTIN) 100 MG capsule, Take 1 capsule (100 mg total) by mouth every evening., Disp: 30 capsule, Rfl: 3   glimepiride (AMARYL) 2 MG tablet, Take 2 mg by mouth every morning., Disp: , Rfl:    JARDIANCE 10 MG TABS tablet, TAKE 1 TABLET BY MOUTH DAILY BEFORE BREAKFAST., Disp: 30 tablet, Rfl: 2   losartan (COZAAR) 50 MG tablet, Take 1 tablet (50 mg total) by mouth daily., Disp: 90 tablet, Rfl: 2   meloxicam (MOBIC) 7.5 MG tablet, Take 7.5 mg by mouth daily., Disp: , Rfl:    metFORMIN (GLUCOPHAGE) 1000 MG tablet, TAKE 1 TABLET (1,000 MG TOTAL) BY MOUTH 2 (TWO) TIMES DAILY WITH A MEAL., Disp: 60 tablet, Rfl: 2   mupirocin ointment (BACTROBAN) 2 %, Apply 1 application topically daily. With dressing changes, Disp: 22 g, Rfl: 0   tamsulosin (FLOMAX) 0.4 MG CAPS capsule, Take 1 capsule (0.4 mg total) by mouth daily., Disp: 30 capsule, Rfl: 11   terbinafine (LAMISIL) 250 MG tablet, Take 1 tablet (250 mg total) by mouth daily., Disp: 30 tablet, Rfl: 0   lidocaine (LIDODERM) 5 %, Place 1 patch onto the skin daily. Remove & Discard patch within 12 hours or as directed by MD (Patient not taking: Reported on 12/19/2020), Disp: 30 patch, Rfl: 3   nicotine (NICODERM CQ - DOSED IN MG/24 HOURS) 14 mg/24hr patch, Place 1 patch (14 mg total) onto the skin daily. (Patient not taking: No sig reported), Disp: 28 patch, Rfl: 1    Assessment & Plan:  Dm fsbs / random sigar high yesterday recheck today  On amaryl , jardiance and metformin 1000 mg bid   Lab Results  Component Value  Date   HGBA1C 7.1 (H) 08/28/2020     DM check HbA1c,  urine  microalbumin  diabetic diet plan given to pt  adviced regarding hypoglycemia and instructions given to pt today on how to prevent and treat the same if it were to occur. pt acknowledges the plan and voices understanding of the same.  exercise plan given and encouraged.   advice diabetic yearly podiatry, ophthalmology , nutritionist , dental check q 6 months   2. Onychomycosis is on lamisil seen dermatology Psoriaisis - chronic non curable.   3. HCC - chornic, pt appears jaundiced from elevated Bili and sec to [facility]  Will need to fu with GI for furher tx  To have chemo as in HPI  To see [facility] for further Interventional evalitaiton / tx of his [place] Fu and mx per Heme onc.   4. Penile warts to fu with [facility] for ? Surgery / medical mx.   5. Penile d/c  persitant, chronic, to fu with urology pt verbalised understaning will call their office.   Labs next visit CBC, CMP, FLP, urine microalbumin Problem List Items Addressed This Visit   None    No orders of the defined types were placed in this encounter.    No orders  of the defined types were placed in this encounter.    Follow up plan: No follow-ups on file.

## 2020-12-20 DIAGNOSIS — R42 Dizziness and giddiness: Secondary | ICD-10-CM | POA: Diagnosis not present

## 2020-12-20 DIAGNOSIS — R200 Anesthesia of skin: Secondary | ICD-10-CM | POA: Diagnosis not present

## 2020-12-20 DIAGNOSIS — R2689 Other abnormalities of gait and mobility: Secondary | ICD-10-CM | POA: Diagnosis not present

## 2020-12-20 DIAGNOSIS — Z8673 Personal history of transient ischemic attack (TIA), and cerebral infarction without residual deficits: Secondary | ICD-10-CM | POA: Diagnosis not present

## 2020-12-20 LAB — LIPID PANEL
Chol/HDL Ratio: 2.1 ratio (ref 0.0–5.0)
Cholesterol, Total: 177 mg/dL (ref 100–199)
HDL: 85 mg/dL (ref >39)
LDL Chol Calc (NIH): 75 mg/dL (ref 0–99)
Triglycerides: 99 mg/dL (ref 0–149)
VLDL Cholesterol Cal: 17 mg/dL (ref 5–40)

## 2020-12-20 LAB — BASIC METABOLIC PANEL
BUN/Creatinine Ratio: 14 (ref 9–20)
BUN: 13 mg/dL (ref 6–24)
CO2: 19 mmol/L — ABNORMAL LOW (ref 20–29)
Calcium: 9.9 mg/dL (ref 8.7–10.2)
Chloride: 100 mmol/L (ref 96–106)
Creatinine, Ser: 0.96 mg/dL (ref 0.76–1.27)
Glucose: 181 mg/dL — ABNORMAL HIGH (ref 65–99)
Potassium: 4.6 mmol/L (ref 3.5–5.2)
Sodium: 137 mmol/L (ref 134–144)
eGFR: 92 mL/min/{1.73_m2} (ref >59)

## 2020-12-20 LAB — CBC WITH DIFFERENTIAL/PLATELET
Basophils Absolute: 0 10*3/uL (ref 0.0–0.2)
Basos: 0 %
EOS (ABSOLUTE): 0 10*3/uL (ref 0.0–0.4)
Eos: 0 %
Hematocrit: 50.1 % (ref 37.5–51.0)
Hemoglobin: 17 g/dL (ref 13.0–17.7)
Immature Grans (Abs): 0 10*3/uL (ref 0.0–0.1)
Immature Granulocytes: 0 %
Lymphocytes Absolute: 1.4 10*3/uL (ref 0.7–3.1)
Lymphs: 17 %
MCH: 33.5 pg — ABNORMAL HIGH (ref 26.6–33.0)
MCHC: 33.9 g/dL (ref 31.5–35.7)
MCV: 99 fL — ABNORMAL HIGH (ref 79–97)
Monocytes Absolute: 1.2 10*3/uL — ABNORMAL HIGH (ref 0.1–0.9)
Monocytes: 14 %
Neutrophils Absolute: 5.8 10*3/uL (ref 1.4–7.0)
Neutrophils: 69 %
Platelets: 108 10*3/uL — ABNORMAL LOW (ref 150–450)
RBC: 5.08 x10E6/uL (ref 4.14–5.80)
RDW: 12.6 % (ref 11.6–15.4)
WBC: 8.5 10*3/uL (ref 3.4–10.8)

## 2020-12-20 LAB — TSH: TSH: 1.92 u[IU]/mL (ref 0.450–4.500)

## 2020-12-21 ENCOUNTER — Telehealth: Payer: Medicaid Other | Admitting: Internal Medicine

## 2020-12-21 DIAGNOSIS — M545 Low back pain, unspecified: Secondary | ICD-10-CM | POA: Diagnosis not present

## 2020-12-21 NOTE — Telephone Encounter (Signed)
George Haggart brought in disability parking placard paperwork to be filled out.  The other form filled out prior was not filled out correctly by the patient.  Patient will pickup paperwork when it's ready, he has asked to be called at [phone].  Placed in provider's folder.

## 2020-12-25 DIAGNOSIS — M545 Low back pain, unspecified: Secondary | ICD-10-CM | POA: Diagnosis not present

## 2020-12-25 NOTE — Addendum Note (Signed)
Addended by: Georgina Peer on: 12/25/2020 11:59 AM   Modules accepted: Orders

## 2021-01-01 ENCOUNTER — Other Ambulatory Visit: Payer: Self-pay | Admitting: Dermatology

## 2021-01-01 DIAGNOSIS — C220 Liver cell carcinoma: Secondary | ICD-10-CM | POA: Diagnosis not present

## 2021-01-01 DIAGNOSIS — B353 Tinea pedis: Secondary | ICD-10-CM

## 2021-01-03 DIAGNOSIS — M545 Low back pain, unspecified: Secondary | ICD-10-CM | POA: Diagnosis not present

## 2021-01-04 ENCOUNTER — Telehealth: Payer: Self-pay | Admitting: *Deleted

## 2021-01-04 NOTE — Telephone Encounter (Signed)
Patient called reporting that Johnny Kelley wanted Johnny Kelley to look at the MRI he had done at outside facility for her opinion. I do not see a report or images in patient chart and he states that he has a copy of the report and images and will bring it to our center for Johnny Kelley's review either today or the first of the week.    Johnny Kelley - 55 y.o. Male; born [birth year]Encounter Summary, generated on Oct. 07, 2022October 07, 2022  Reason for Visit  Reason for Visit lower back/L-spine problem   Assessment and Plan  Assessment and Plan Assessment Note  IMPRESSION: This is a 55 year old male with a history of liver lesion. He has had diffusely infiltrative process in his lower lumbar spine. Radiology recommended a whole body bone scan. We are going to order that today.   1. Recommended that they follow up with Johnny Kelley, oncologist.   2. Tizanidine for this.   3. If nothing comes back with a work-up, we can consider doing a back injection.    DISPOSITION:   I discussed with the patient my impression. All questions were answered and the patient agrees with the plan.   ATTESTATION:   This note has been created using Dragon Ambient experience and was completed in the EHR by Mickle Mallory.    Assessment and Plan 1. Low back pain   bone health referral - patient needs a full body bone scan    tizanidine 4 mg tablet   Discussion Note: None recorded.   Patient educational handouts: No information available.

## 2021-01-04 NOTE — Telephone Encounter (Signed)
I called the pt and he has a disc and print out of results and he and PCP wanted dr  Janese Banks to review. He will bring it next week. Told him to ask to give it to me. He is agreeable. He also wants rao to review mri  of abd. And see what she feels after reading it.

## 2021-01-05 NOTE — Telephone Encounter (Signed)
Johnny Kelley- I saw [facility] IR note. They wanted to review the mri that we did for him last month and ir at [facility] was going to proceed with chemoembolization there. Has he had any other scan since then that I am supposed to review?  Kristi- can you make sure [facility] has access to mri images that we did here?

## 2021-01-06 ENCOUNTER — Other Ambulatory Visit: Payer: Self-pay | Admitting: Internal Medicine

## 2021-01-06 NOTE — Telephone Encounter (Signed)
Requested Prescriptions  Pending Prescriptions Disp Refills  . famotidine (PEPCID) 20 MG tablet [Pharmacy Med Name: FAMOTIDINE 20 MG TABLET] 30 tablet 3    Sig: TAKE 1 TABLET BY MOUTH EVERYDAY AT BEDTIME     Gastroenterology:  H2 Antagonists Passed - 01/06/2021  1:26 AM      Passed - Valid encounter within last 12 months    Recent Outpatient Visits          2 weeks ago Diabetes mellitus without complication ([place])   [facility] Vigg, Avanti, MD   3 months ago Hypercholesterolemia   [facility] Vigg, Avanti, MD   4 months ago Hepatocellular carcinoma ([place])   [facility] Vigg, Avanti, MD   4 months ago Screen for colon cancer   [place] Vigg, Avanti, MD      Future Appointments            In 3 days Ralene Bathe, MD [place]   In 3 weeks Vigg, Avanti, MD [facility] [place]   In 70 months Hollice Espy, MD [place]

## 2021-01-07 NOTE — Telephone Encounter (Signed)
[facility] has everything they need. Please review the 01/01/21 note from [facility], under the media tab, referencing a MRI lumbar spine done 11/28/20.

## 2021-01-08 ENCOUNTER — Telehealth: Payer: Self-pay

## 2021-01-08 DIAGNOSIS — M545 Low back pain, unspecified: Secondary | ICD-10-CM | POA: Diagnosis not present

## 2021-01-08 NOTE — Telephone Encounter (Signed)
Reached out to [facility] in regards to pts MRI images; was transferred to medical records where I left a VM for Erica in order to obtain images on a disc if possible. Waiting on a call back.

## 2021-01-09 ENCOUNTER — Other Ambulatory Visit: Payer: Self-pay | Admitting: *Deleted

## 2021-01-09 ENCOUNTER — Encounter: Payer: Self-pay | Admitting: Dermatology

## 2021-01-09 ENCOUNTER — Other Ambulatory Visit: Payer: Self-pay

## 2021-01-09 ENCOUNTER — Ambulatory Visit (INDEPENDENT_AMBULATORY_CARE_PROVIDER_SITE_OTHER): Payer: Medicaid Other | Admitting: Dermatology

## 2021-01-09 ENCOUNTER — Telehealth: Payer: Self-pay

## 2021-01-09 ENCOUNTER — Ambulatory Visit
Admission: RE | Admit: 2021-01-09 | Discharge: 2021-01-09 | Disposition: A | Discharge status: Home / Self Care | Payer: Self-pay | Source: Ambulatory Visit | Attending: Oncology | Admitting: Oncology

## 2021-01-09 DIAGNOSIS — M545 Low back pain, unspecified: Secondary | ICD-10-CM

## 2021-01-09 DIAGNOSIS — B351 Tinea unguium: Secondary | ICD-10-CM | POA: Diagnosis not present

## 2021-01-09 DIAGNOSIS — C220 Liver cell carcinoma: Secondary | ICD-10-CM

## 2021-01-09 DIAGNOSIS — B353 Tinea pedis: Secondary | ICD-10-CM | POA: Diagnosis not present

## 2021-01-09 DIAGNOSIS — T1490XA Injury, unspecified, initial encounter: Secondary | ICD-10-CM | POA: Diagnosis not present

## 2021-01-09 DIAGNOSIS — B352 Tinea manuum: Secondary | ICD-10-CM

## 2021-01-09 MED ORDER — TERBINAFINE HCL 250 MG PO TABS: 250.0000 mg | ORAL_TABLET | Freq: Every day | ORAL | 1 refills | Status: DC

## 2021-01-09 NOTE — Patient Instructions (Signed)

## 2021-01-09 NOTE — Progress Notes (Signed)
   Follow-Up Visit   Subjective  Johnny Kelley is a 55 y.o. male who presents for the following: Skin Problem (Check a spot on the right arm oozing and irritating ) and Nail Problem (5 weeks f/u on tinea pedis, taking Lamisil tablets with a good response ).  The following portions of the chart were reviewed this encounter and updated as appropriate:   Tobacco  Allergies  Meds  Problems  Med Hx  Surg Hx  Fam Hx     Review of Systems:  No other skin or systemic complaints except as noted in HPI or Assessment and Plan.  Objective  Well appearing patient in no apparent distress; mood and affect are within normal limits.  A focused examination was performed including face. Relevant physical exam findings are noted in the Assessment and Plan.  right arm Ulcerated plaque   fingernails and feet Sign of clearing of the finger nails,    Assessment & Plan  Trauma versus other right arm Trauma vs inflamed psoriasis versus other Cleansed with Puracyn, applied Mupirocin ointment and non stick gauge wrap  May consider biopsy at the next office visit if no better   Tinea pedis of both feet and tinea Manis and tinea unguium fingernails and feet Tinea pedis and tinea unguium of the hands and feet. (Changes most consistent with fungal, but some of the nail changes may be related to psoriasis.) Chronic and persistent -improving on oral antifungal treatment.  Patient has no side effects.  He would like to continue.  Cont Terbinafine 250 mg take 1 tablet daily x 2 months then stop Terbinafine Counseling  Terbinafine is an anti-fungal medicine that can be applied to the skin (over the counter) or taken by mouth (prescription) to treat fungal infections. The pill version is often used to treat fungal infections of the nails or scalp. While most people do not have any side effects from taking terbinafine pills, some possible side effects of the medicine can include taste changes, headache,  loss of smell, vision changes, nausea, vomiting, or diarrhea.   Rare side effects can include irritation of the liver, allergic reaction, or decrease in blood counts (which may show up as not feeling well or developing an infection). If you are concerned about any of these side effects, please stop the medicine and call your doctor, or in the case of an emergency such as feeling very unwell, seek immediate medical care.    Will reevaluate psoriasis and discuss treatment after finishing fungal treatment   Related Medications terbinafine (LAMISIL) 250 MG tablet Take 1 tablet (250 mg total) by mouth daily.  Return in about 3 months (around 04/11/2021) for Tinea pedis, tinea unguium, Psoriasis .  IMarye Round, CMA, am acting as scribe for Sarina Ser, MD .  Documentation: I have reviewed the above documentation for accuracy and completeness, and I agree with the above.  Sarina Ser, MD

## 2021-01-09 NOTE — Telephone Encounter (Signed)
Reached out to [facility] and spoke to Remy in regards to pts latest scans. She stated she will speak to MRI dept in order to powershare images over to [place].

## 2021-01-10 ENCOUNTER — Ambulatory Visit
Admission: RE | Admit: 2021-01-10 | Discharge: 2021-01-10 | Disposition: A | Discharge status: Home / Self Care | Payer: Self-pay | Source: Ambulatory Visit | Attending: Oncology | Admitting: Oncology

## 2021-01-10 ENCOUNTER — Telehealth: Payer: Self-pay

## 2021-01-10 ENCOUNTER — Other Ambulatory Visit: Payer: Self-pay

## 2021-01-10 DIAGNOSIS — C220 Liver cell carcinoma: Secondary | ICD-10-CM

## 2021-01-10 DIAGNOSIS — M545 Low back pain, unspecified: Secondary | ICD-10-CM | POA: Diagnosis not present

## 2021-01-10 NOTE — Telephone Encounter (Signed)
Received outside images from [facility], taking down to Radiology in order to be read.

## 2021-01-11 ENCOUNTER — Telehealth: Payer: Self-pay

## 2021-01-11 NOTE — Telephone Encounter (Signed)
Called and spoke with brother, Johnny Kelley. Radiology has reviewed and compared recent MRI. Radiologist can perfom biopsy of iliac bone for abnormal bone marrow. At this time he prefers to only have injections as planned for back pain as recommended by Dr. Kayleen Memos at [facility]. Dr. Kayleen Memos does need clearance from Dr. Janese Banks prior to injecting. Per Johnny Kelley, if Dr. Janese Banks does not think he should proceed with injections without having biopsy performed then he will consider. Dr. Janese Banks will speak with Dr. Kayleen Memos.

## 2021-01-11 NOTE — Telephone Encounter (Signed)
We have received disc from [facility] yesterday morning. I took the disc to Radiology to be read as well as put in orders for outside images.

## 2021-01-14 DIAGNOSIS — M5459 Other low back pain: Secondary | ICD-10-CM | POA: Diagnosis not present

## 2021-01-17 DIAGNOSIS — M5459 Other low back pain: Secondary | ICD-10-CM | POA: Diagnosis not present

## 2021-01-22 DIAGNOSIS — M5459 Other low back pain: Secondary | ICD-10-CM | POA: Diagnosis not present

## 2021-01-24 DIAGNOSIS — M5459 Other low back pain: Secondary | ICD-10-CM | POA: Diagnosis not present

## 2021-01-28 DIAGNOSIS — M5459 Other low back pain: Secondary | ICD-10-CM | POA: Diagnosis not present

## 2021-01-29 ENCOUNTER — Inpatient Hospital Stay: Payer: Medicaid Other

## 2021-01-29 ENCOUNTER — Other Ambulatory Visit: Payer: Self-pay

## 2021-01-29 ENCOUNTER — Encounter: Payer: Self-pay | Admitting: Oncology

## 2021-01-29 ENCOUNTER — Inpatient Hospital Stay: Payer: Medicaid Other | Attending: Oncology | Admitting: Oncology

## 2021-01-29 VITALS — BP 148/82 | HR 80 | Temp 99.0°F | Resp 18 | Wt 209.6 lb

## 2021-01-29 DIAGNOSIS — E119 Type 2 diabetes mellitus without complications: Secondary | ICD-10-CM | POA: Diagnosis not present

## 2021-01-29 DIAGNOSIS — I10 Essential (primary) hypertension: Secondary | ICD-10-CM | POA: Diagnosis not present

## 2021-01-29 DIAGNOSIS — R937 Abnormal findings on diagnostic imaging of other parts of musculoskeletal system: Secondary | ICD-10-CM

## 2021-01-29 DIAGNOSIS — Z7984 Long term (current) use of oral hypoglycemic drugs: Secondary | ICD-10-CM | POA: Diagnosis not present

## 2021-01-29 DIAGNOSIS — Z7982 Long term (current) use of aspirin: Secondary | ICD-10-CM | POA: Insufficient documentation

## 2021-01-29 DIAGNOSIS — C220 Liver cell carcinoma: Secondary | ICD-10-CM | POA: Insufficient documentation

## 2021-01-29 DIAGNOSIS — Z23 Encounter for immunization: Secondary | ICD-10-CM

## 2021-01-29 DIAGNOSIS — R7989 Other specified abnormal findings of blood chemistry: Secondary | ICD-10-CM | POA: Insufficient documentation

## 2021-01-29 DIAGNOSIS — Z79899 Other long term (current) drug therapy: Secondary | ICD-10-CM | POA: Diagnosis not present

## 2021-01-29 MED ORDER — INFLUENZA VAC SPLIT QUAD 0.5 ML IM SUSY
0.5000 mL | PREFILLED_SYRINGE | Freq: Once | INTRAMUSCULAR | Status: AC
  Administered 2021-01-29: 0.5 mL via INTRAMUSCULAR
  Filled 2021-01-29: qty 0.5

## 2021-01-30 ENCOUNTER — Ambulatory Visit: Payer: Medicaid Other | Admitting: Internal Medicine

## 2021-01-30 DIAGNOSIS — R399 Unspecified symptoms and signs involving the genitourinary system: Secondary | ICD-10-CM | POA: Diagnosis not present

## 2021-01-30 DIAGNOSIS — N481 Balanitis: Secondary | ICD-10-CM | POA: Diagnosis not present

## 2021-01-30 DIAGNOSIS — B379 Candidiasis, unspecified: Secondary | ICD-10-CM | POA: Diagnosis not present

## 2021-01-30 DIAGNOSIS — A630 Anogenital (venereal) warts: Secondary | ICD-10-CM | POA: Diagnosis not present

## 2021-01-30 NOTE — Progress Notes (Signed)
Hematology/Oncology Consult note [facility]  [phone] [phone]  Patient Care Team: Charlynne Cousins, MD as PCP - General (Internal Medicine) Clent Jacks, RN as Oncology Nurse Navigator   Name of the patient: Johnny Kelley  [id number]  Aug 20, 1965   Date of visit: 01/30/21  Diagnosis- 1.  Nonmetastatic hepatocellular carcinoma 2.  Infiltrative lesion of unclear etiology  Chief complaint/ Reason for visit-discuss MRI lumbar spine results and further management  Heme/Onc history: Patient is a 55 year old male with past medical history significant for newly diagnosed hepatitis C infection as well as significant alcohol intake.  He was noted to have abnormal LFTs.  In July 2021 he was found to have an elevated bilirubin of 1.9 with an AST ALT of 128 and 86 respectively and underwent a right upper quadrant ultrasound which showed a heterogeneous abnormality in the right hepatic lobe measuring 4.1 x 4.1 x 3.5 cm.  He was then seen by Dr. Allen Norris and underwent MRI liver with and without contrast which showed a 3.8 cm enhancing lesion in segment 6 compatible with [place] lie RADS 5.  He also underwent work-up for abnormal LFTs and was found to have hepatitis C and will be starting treatment for that soon.  He has been referred to [place] for further evaluation of liver mass.   Results of blood work from 01/13/2020 reveal a normal albumin level of 3.5.  AST and ALT mildly elevated at 70 and 51 respectively with a total bilirubin of 1.6.  PT/INR was normal.  Platelet count was mildly low at 105.  CT chest with contrast did not reveal any evidence of metastatic disease  Interval history-patient will be undergoing liver directed therapy at [facility] this week.  Patient was also seen by [facility] for ongoing low back painAnd MRI of the lumbar spine which showed a diffuse infiltrating process in his lumbar spine.  Differential diagnosis was leukemia lymphoma  metastases or myeloma.  This was followed by a bone scan the results of which I do not have.  MRI lumbar spine images were exported to our system and his MRI abdomen was compared to those.  These abnormalities were not overtly evident on the MRI abdomen.  Patient is interested in knowing if he can get injections into his back for his back pain  ECOG PS- 1 Pain scale- 5   Review of systems- Review of Systems  Constitutional:  Positive for malaise/fatigue. Negative for chills, fever and weight loss.  HENT:  Negative for congestion, ear discharge and nosebleeds.   Eyes:  Negative for blurred vision.  Respiratory:  Negative for cough, hemoptysis, sputum production, shortness of breath and wheezing.   Cardiovascular:  Negative for chest pain, palpitations, orthopnea and claudication.  Gastrointestinal:  Negative for abdominal pain, blood in stool, constipation, diarrhea, heartburn, melena, nausea and vomiting.  Genitourinary:  Negative for dysuria, flank pain, frequency, hematuria and urgency.  Musculoskeletal:  Positive for back pain. Negative for joint pain and myalgias.  Skin:  Negative for rash.  Neurological:  Negative for dizziness, tingling, focal weakness, seizures, weakness and headaches.  Endo/Heme/Allergies:  Does not bruise/bleed easily.  Psychiatric/Behavioral:  Negative for depression and suicidal ideas. The patient does not have insomnia.       Allergies  Allergen Reactions   Amlodipine Swelling     Past Medical History:  Diagnosis Date   Cancer, hepatocellular ([place])    Diabetes mellitus without complication ([place])    Hepatitis C    Hypertension  Psoriasis    Stroke ([place])    Hx stroke in 2020   Substance abuse [facility])      Past Surgical History:  Procedure Laterality Date   IR RADIOLOGIST EVAL & MGMT  01/19/2020   IR RADIOLOGIST EVAL & MGMT  01/31/2020   IR RADIOLOGIST EVAL & MGMT  04/12/2020   IR RADIOLOGIST EVAL & MGMT  08/30/2020   KNEE SURGERY Right      Social History   Socioeconomic History   Marital status: Unknown    Spouse name: Not on file   Number of children: Not on file   Years of education: Not on file   Highest education level: Not on file  Occupational History   Occupation: unemployed  Tobacco Use   Smoking status: Every Day    Packs/day: 0.25    Years: 38.00    Pack years: 9.50    Types: Cigarettes   Smokeless tobacco: Never  Vaping Use   Vaping Use: Never used  Substance and Sexual Activity   Alcohol use: Yes    Alcohol/week: 21.0 standard drinks    Types: 21 Cans of beer per week   Drug use: Yes    Types: Cocaine, Marijuana    Comment: reports marijuana use once per month and cocaine use 2-3 times per month   Sexual activity: Not on file  Other Topics Concern   Not on file  Social History Narrative   Not on file   Social Determinants of Health   Financial Resource Strain: Not on file  Food Insecurity: Not on file  Transportation Needs: Not on file  Physical Activity: Not on file  Stress: Not on file  Social Connections: Not on file  Intimate Partner Violence: Not on file    Family History  Problem Relation Age of Onset   Diabetes Mother    Colon cancer Father    Diabetes Father    Kidney disease Maternal Grandmother    Lung cancer Maternal Grandfather    Hyperlipidemia Paternal Grandmother    COPD Paternal Grandfather      Current Outpatient Medications:    blood glucose meter kit and supplies KIT, Dispense based on patient and insurance preference. Use up to four times daily as directed. (FOR ICD-9 250.00, 250.01)., Disp: 1 each, Rfl: 0   famotidine (PEPCID) 20 MG tablet, TAKE 1 TABLET BY MOUTH EVERYDAY AT BEDTIME, Disp: 30 tablet, Rfl: 3   gabapentin (NEURONTIN) 100 MG capsule, Take 1 capsule (100 mg total) by mouth every evening., Disp: 30 capsule, Rfl: 3   glimepiride (AMARYL) 2 MG tablet, Take 2 mg by mouth every morning., Disp: , Rfl:    JARDIANCE 10 MG TABS tablet, TAKE 1 TABLET  BY MOUTH DAILY BEFORE BREAKFAST., Disp: 30 tablet, Rfl: 2   losartan (COZAAR) 50 MG tablet, Take 1 tablet (50 mg total) by mouth daily., Disp: 90 tablet, Rfl: 2   meloxicam (MOBIC) 7.5 MG tablet, Take 7.5 mg by mouth daily., Disp: , Rfl:    metFORMIN (GLUCOPHAGE) 1000 MG tablet, TAKE 1 TABLET (1,000 MG TOTAL) BY MOUTH 2 (TWO) TIMES DAILY WITH A MEAL., Disp: 60 tablet, Rfl: 2   mupirocin ointment (BACTROBAN) 2 %, Apply 1 application topically daily. With dressing changes, Disp: 22 g, Rfl: 0   nicotine (NICODERM CQ - DOSED IN MG/24 HOURS) 14 mg/24hr patch, Place 1 patch (14 mg total) onto the skin daily., Disp: 28 patch, Rfl: 1   tamsulosin (FLOMAX) 0.4 MG CAPS capsule, Take 1 capsule (0.4  mg total) by mouth daily., Disp: 30 capsule, Rfl: 11   terbinafine (LAMISIL) 250 MG tablet, Take 1 tablet (250 mg total) by mouth daily., Disp: 30 tablet, Rfl: 1   aspirin 325 MG tablet, Take 1 tablet (325 mg total) by mouth daily. (Patient not taking: Reported on 01/29/2021), Disp: 120 tablet, Rfl: 0   lidocaine (LIDODERM) 5 %, Place 1 patch onto the skin daily. Remove & Discard patch within 12 hours or as directed by MD (Patient not taking: No sig reported), Disp: 30 patch, Rfl: 3  Physical exam:  Vitals:   01/29/21 1405  BP: (!) 148/82  Pulse: 80  Resp: 18  Temp: 99 F (37.2 C)  TempSrc: Tympanic  SpO2: 100%  Weight: 209 lb 9.6 oz (95.1 kg)   Physical Exam Constitutional:      General: He is not in acute distress. Cardiovascular:     Rate and Rhythm: Normal rate and regular rhythm.     Heart sounds: Normal heart sounds.  Pulmonary:     Effort: Pulmonary effort is normal.     Breath sounds: Normal breath sounds.  Abdominal:     General: Bowel sounds are normal.     Palpations: Abdomen is soft.  Skin:    General: Skin is warm and dry.  Neurological:     Mental Status: He is alert and oriented to person, place, and time.     CMP Latest Ref Rng & Units 12/19/2020  Glucose 65 - 99 mg/dL 181(H)   BUN 6 - 24 mg/dL 13  Creatinine 0.76 - 1.27 mg/dL 0.96  Sodium 134 - 144 mmol/L 137  Potassium 3.5 - 5.2 mmol/L 4.6  Chloride 96 - 106 mmol/L 100  CO2 20 - 29 mmol/L 19(L)  Calcium 8.7 - 10.2 mg/dL 9.9  Total Protein 6.5 - 8.1 g/dL -  Total Bilirubin 0.3 - 1.2 mg/dL -  Alkaline Phos 38 - 126 U/L -  AST 15 - 41 U/L -  ALT 0 - 44 U/L -   CBC Latest Ref Rng & Units 12/19/2020  WBC 3.4 - 10.8 x10E3/uL 8.5  Hemoglobin 13.0 - 17.7 g/dL 17.0  Hematocrit 37.5 - 51.0 % 50.1  Platelets 150 - 450 x10E3/uL 108(L)     Assessment and plan- Patient is a 55 y.o. male who is here for follow-up of following issues:  Nonmetastatic hepatocellular carcinoma: Patient will [facility].  He will need a repeat MRI sometime in December 2022 which can either be coordinated here at [facility]. Infiltrative lesion noted in the lumbar spine: Etiology of this is unclear.  Before patient can undergo any injections into his back I would like a biopsy of this to figure out what the etiology is.IR has agreed to do a CT-guided iliac bone biopsy that would also include a bone marrow.  I have explained to the patient that it would be in his best interest to get this biopsy done so we know what we are dealing with before proceeding with injections.  I will see him back after these biopsy results are back.  Based on his blood work there is no evidence of anemia or elevated protein suggestive of multiple myeloma.  It would be unusual for [facility] to metastasize and present as an infiltrative lesion in the lumbar spine.  Follow-up with me to be decided based on biopsy   Visit Diagnosis 1. Hepatocellular carcinoma ([place])   2. Abnormal MRI, lumbar spine  Dr. Randa Evens, MD, MPH [facility] at [facility] [phone] 01/30/2021 8:01 AM

## 2021-01-31 DIAGNOSIS — M5459 Other low back pain: Secondary | ICD-10-CM | POA: Diagnosis not present

## 2021-02-01 ENCOUNTER — Ambulatory Visit: Payer: Medicaid Other | Admitting: Internal Medicine

## 2021-02-01 DIAGNOSIS — C220 Liver cell carcinoma: Secondary | ICD-10-CM | POA: Diagnosis not present

## 2021-02-02 ENCOUNTER — Other Ambulatory Visit: Payer: Self-pay | Admitting: Internal Medicine

## 2021-02-02 NOTE — Telephone Encounter (Signed)
Requested Prescriptions  Pending Prescriptions Disp Refills  . gabapentin (NEURONTIN) 100 MG capsule [Pharmacy Med Name: GABAPENTIN 100 MG CAPSULE] 30 capsule 3    Sig: TAKE 1 CAPSULE BY MOUTH EVERY EVENING.     Neurology: Anticonvulsants - gabapentin Passed - 02/02/2021  4:47 PM      Passed - Valid encounter within last 12 months    Recent Outpatient Visits          1 month ago Diabetes mellitus without complication ([place])   [facility] Vigg, Avanti, MD   4 months ago Hypercholesterolemia   [facility] Vigg, Avanti, MD   5 months ago Hepatocellular carcinoma ([place])   [facility] Vigg, Avanti, MD   5 months ago Screen for colon cancer   [place], [place]      Future Appointments            In 3 days Vigg, Avanti, MD [facility]   In 2 months Ralene Bathe, MD [place]   In 10 months Hollice Espy, MD [place]

## 2021-02-04 ENCOUNTER — Other Ambulatory Visit: Payer: Self-pay | Admitting: *Deleted

## 2021-02-04 DIAGNOSIS — R937 Abnormal findings on diagnostic imaging of other parts of musculoskeletal system: Secondary | ICD-10-CM

## 2021-02-04 DIAGNOSIS — M5459 Other low back pain: Secondary | ICD-10-CM | POA: Diagnosis not present

## 2021-02-04 DIAGNOSIS — C220 Liver cell carcinoma: Secondary | ICD-10-CM

## 2021-02-05 ENCOUNTER — Telehealth: Payer: Self-pay

## 2021-02-05 ENCOUNTER — Other Ambulatory Visit: Payer: Self-pay | Admitting: Internal Medicine

## 2021-02-05 ENCOUNTER — Ambulatory Visit (INDEPENDENT_AMBULATORY_CARE_PROVIDER_SITE_OTHER): Payer: Medicaid Other | Admitting: Internal Medicine

## 2021-02-05 ENCOUNTER — Encounter: Payer: Self-pay | Admitting: Internal Medicine

## 2021-02-05 ENCOUNTER — Other Ambulatory Visit: Payer: Self-pay

## 2021-02-05 VITALS — BP 139/81 | HR 85 | Temp 98.3°F | Ht 72.05 in | Wt 207.4 lb

## 2021-02-05 DIAGNOSIS — E1140 Type 2 diabetes mellitus with diabetic neuropathy, unspecified: Secondary | ICD-10-CM

## 2021-02-05 DIAGNOSIS — E7800 Pure hypercholesterolemia, unspecified: Secondary | ICD-10-CM

## 2021-02-05 DIAGNOSIS — K769 Liver disease, unspecified: Secondary | ICD-10-CM | POA: Diagnosis not present

## 2021-02-05 NOTE — Telephone Encounter (Signed)
Requested Prescriptions  Pending Prescriptions Disp Refills  . metFORMIN (GLUCOPHAGE) 1000 MG tablet [Pharmacy Med Name: METFORMIN HCL 1,000 MG TABLET] 60 tablet 2    Sig: TAKE 1 TABLET (1,000 MG TOTAL) BY MOUTH 2 (TWO) TIMES DAILY WITH A MEAL.     Endocrinology:  Diabetes - Biguanides Passed - 02/05/2021  1:26 AM      Passed - Cr in normal range and within 360 days    Creatinine, Ser  Date Value Ref Range Status  12/19/2020 0.96 0.76 - 1.27 mg/dL Final         Passed - HBA1C is between 0 and 7.9 and within 180 days    HB A1C (BAYER DCA - WAIVED)  Date Value Ref Range Status  12/19/2020 6.3 (H) 4.8 - 5.6 % Final    Comment:             Prediabetes: 5.7 - 6.4          Diabetes: >6.4          Glycemic control for adults with diabetes: <7.0               **Please note reference interval change**          Passed - eGFR in normal range and within 360 days    GFR calc Af Amer  Date Value Ref Range Status  10/10/2019 >60 >60 mL/min Final   GFR, Estimated  Date Value Ref Range Status  12/18/2020 >60 >60 mL/min Final    Comment:    (NOTE) Calculated using the CKD-EPI Creatinine Equation (2021)    eGFR  Date Value Ref Range Status  12/19/2020 92 >59 mL/min/1.73 Final         Passed - Valid encounter within last 6 months    Recent Outpatient Visits          1 month ago Diabetes mellitus without complication ([place])   [facility] Vigg, Avanti, MD   4 months ago Hypercholesterolemia   [facility] Vigg, Avanti, MD   5 months ago Hepatocellular carcinoma ([place])   [facility] Vigg, Avanti, MD   5 months ago Screen for colon cancer   [facility] Vigg, Avanti, MD      Future Appointments            Today Vigg, Avanti, MD [facility]   In 2 months Ralene Bathe, MD [place]   In 10 months Hollice Espy, MD [place]

## 2021-02-05 NOTE — Progress Notes (Signed)
BP 139/81   Pulse 85   Temp 98.3 F (36.8 C) (Oral)   Ht 6' 0.05" (1.83 m)   Wt 207 lb 6.4 oz (94.1 kg)   SpO2 98%   BMI 28.09 kg/m    Subjective:    Patient ID: Johnny Kelley, male    DOB: [birth year], 55 y.o.   MRN: [id number]  Chief Complaint  Patient presents with  . Diabetes  . Hyperlipidemia  . Back Pain    HPI: [name] is a 55 y.o. male  Pt was more jaundiced - Pt had elevated LFTs and diag with Hep C and HCC sec to hep c and alcohol abuse, sees urology for genital warts which he refused' tx/ penile d/c for which he has been wu with testing for STD's - got antibiotics and says purulent d/c stoped per pt. Took 1 pill and this helped. Diflucan 150 mg x 2 doses ? Sec to being Immunocompromised status Sees Dr Georgina Quint for such . Was diagnosed with balanitis.  as well as significant alcohol intake. Saw Dr. Tasia Catchings @ [facility] radiology had chemo embolization on th 4th   Back pain better since PT   Diabetes He presents for his follow-up (no fsbs done but once a month) diabetic visit. He has type 2 diabetes mellitus. His disease course has been worsening.    Chief Complaint  Patient presents with  . Diabetes  . Hyperlipidemia  . Back Pain    Relevant past medical, surgical, family and social history reviewed and updated as indicated. Interim medical history since our last visit reviewed. Allergies and medications reviewed and updated.  Review of Systems  Per HPI unless specifically indicated above     Objective:    BP 139/81   Pulse 85   Temp 98.3 F (36.8 C) (Oral)   Ht 6' 0.05" (1.83 m)   Wt 207 lb 6.4 oz (94.1 kg)   SpO2 98%   BMI 28.09 kg/m   Wt Readings from Last 3 Encounters:  02/05/21 207 lb 6.4 oz (94.1 kg)  01/29/21 209 lb 9.6 oz (95.1 kg)  12/19/20 206 lb (93.4 kg)    Physical Exam Vitals and nursing note reviewed.  Constitutional:      General: He is not in acute distress.    Appearance: Normal appearance. He is not ill-appearing or  diaphoretic.  HENT:     Head: Normocephalic and atraumatic.     Right Ear: Tympanic membrane and external ear normal. There is no impacted cerumen.     Left Ear: External ear normal.     Nose: No congestion or rhinorrhea.     Mouth/Throat:     Pharynx: No oropharyngeal exudate or posterior oropharyngeal erythema.  Eyes:     Conjunctiva/sclera: Conjunctivae normal.     Pupils: Pupils are equal, round, and reactive to light.  Cardiovascular:     Rate and Rhythm: Normal rate and regular rhythm.     Heart sounds: No murmur heard.   No friction rub. No gallop.  Pulmonary:     Effort: No respiratory distress.     Breath sounds: No stridor. No wheezing or rhonchi.  Chest:     Chest wall: No tenderness.  Abdominal:     General: Abdomen is flat. Bowel sounds are normal.     Palpations: Abdomen is soft.     Tenderness: There is no abdominal tenderness.  Musculoskeletal:        General: No swelling, tenderness, deformity or signs of  injury.     Cervical back: Normal range of motion and neck supple. No rigidity or tenderness.     Left lower leg: No edema.  Skin:    General: Skin is warm and dry.  Neurological:     Mental Status: He is alert.   Results for orders placed or performed in visit on 12/19/20  Bayer DCA Hb A1c Waived (STAT)  Result Value Ref Range   HB A1C (BAYER DCA - WAIVED) 6.3 (H) 4.8 - 5.6 %  TSH  Result Value Ref Range   TSH 1.920 0.450 - 4.500 uIU/mL  CBC with Differential/Platelet  Result Value Ref Range   WBC 8.5 3.4 - 10.8 x10E3/uL   RBC 5.08 4.14 - 5.80 x10E6/uL   Hemoglobin 17.0 13.0 - 17.7 g/dL   Hematocrit 50.1 37.5 - 51.0 %   MCV 99 (H) 79 - 97 fL   MCH 33.5 (H) 26.6 - 33.0 pg   MCHC 33.9 31.5 - 35.7 g/dL   RDW 12.6 11.6 - 15.4 %   Platelets 108 (L) 150 - 450 x10E3/uL   Neutrophils 69 Not Estab. %   Lymphs 17 Not Estab. %   Monocytes 14 Not Estab. %   Eos 0 Not Estab. %   Basos 0 Not Estab. %   Neutrophils Absolute 5.8 1.4 - 7.0 x10E3/uL    Lymphocytes Absolute 1.4 0.7 - 3.1 x10E3/uL   Monocytes Absolute 1.2 (H) 0.1 - 0.9 x10E3/uL   EOS (ABSOLUTE) 0.0 0.0 - 0.4 x10E3/uL   Basophils Absolute 0.0 0.0 - 0.2 x10E3/uL   Immature Granulocytes 0 Not Estab. %   Immature Grans (Abs) 0.0 0.0 - 0.1 x10E3/uL  Lipid panel  Result Value Ref Range   Cholesterol, Total 177 100 - 199 mg/dL   Triglycerides 99 0 - 149 mg/dL   HDL 85 >39 mg/dL   VLDL Cholesterol Cal 17 5 - 40 mg/dL   LDL Chol Calc (NIH) 75 0 - 99 mg/dL   Chol/HDL Ratio 2.1 0.0 - 5.0 ratio  Basic metabolic panel  Result Value Ref Range   Glucose 181 (H) 65 - 99 mg/dL   BUN 13 6 - 24 mg/dL   Creatinine, Ser 0.96 0.76 - 1.27 mg/dL   eGFR 92 >59 mL/min/1.73   BUN/Creatinine Ratio 14 9 - 20   Sodium 137 134 - 144 mmol/L   Potassium 4.6 3.5 - 5.2 mmol/L   Chloride 100 96 - 106 mmol/L   CO2 19 (L) 20 - 29 mmol/L   Calcium 9.9 8.7 - 10.2 mg/dL        Current Outpatient Medications:  .  blood glucose meter kit and supplies KIT, Dispense based on patient and insurance preference. Use up to four times daily as directed. (FOR ICD-9 250.00, 250.01)., Disp: 1 each, Rfl: 0 .  clopidogrel (PLAVIX) 75 MG tablet, clopidogrel 75 mg tablet  TAKE 1 TABLET BY MOUTH EVERY DAY, Disp: , Rfl:  .  clotrimazole (LOTRIMIN) 1 % cream, clotrimazole 1 % topical cream, Disp: , Rfl:  .  famotidine (PEPCID) 20 MG tablet, TAKE 1 TABLET BY MOUTH EVERYDAY AT BEDTIME, Disp: 30 tablet, Rfl: 3 .  gabapentin (NEURONTIN) 100 MG capsule, TAKE 1 CAPSULE BY MOUTH EVERY EVENING., Disp: 30 capsule, Rfl: 3 .  glimepiride (AMARYL) 2 MG tablet, Take 2 mg by mouth every morning., Disp: , Rfl:  .  JARDIANCE 10 MG TABS tablet, TAKE 1 TABLET BY MOUTH DAILY BEFORE BREAKFAST., Disp: 30 tablet, Rfl: 2 .  losartan (  COZAAR) 50 MG tablet, Take 1 tablet (50 mg total) by mouth daily., Disp: 90 tablet, Rfl: 2 .  meloxicam (MOBIC) 7.5 MG tablet, Take 7.5 mg by mouth daily., Disp: , Rfl:  .  metFORMIN (GLUCOPHAGE) 1000 MG tablet,  TAKE 1 TABLET (1,000 MG TOTAL) BY MOUTH 2 (TWO) TIMES DAILY WITH A MEAL., Disp: 60 tablet, Rfl: 2 .  mupirocin ointment (BACTROBAN) 2 %, Apply 1 application topically daily. With dressing changes, Disp: 22 g, Rfl: 0 .  tamsulosin (FLOMAX) 0.4 MG CAPS capsule, Take 1 capsule (0.4 mg total) by mouth daily., Disp: 30 capsule, Rfl: 11 .  terbinafine (LAMISIL) 250 MG tablet, Take 1 tablet (250 mg total) by mouth daily., Disp: 30 tablet, Rfl: 1 .  tiZANidine (ZANAFLEX) 4 MG tablet, Take by mouth., Disp: , Rfl:  .  aspirin 325 MG tablet, Take 1 tablet (325 mg total) by mouth daily. (Patient not taking: No sig reported), Disp: 120 tablet, Rfl: 0 .  lidocaine (LIDODERM) 5 %, Place 1 patch onto the skin daily. Remove & Discard patch within 12 hours or as directed by MD (Patient not taking: No sig reported), Disp: 30 patch, Rfl: 3 .  nicotine (NICODERM CQ - DOSED IN MG/24 HOURS) 14 mg/24hr patch, Place 1 patch (14 mg total) onto the skin daily. (Patient not taking: Reported on 02/05/2021), Disp: 28 patch, Rfl: 1    Assessment & Plan:  [place] and Hepatitis C  sees Dr Lynnell Jude for such and fu with Heme onc.  Chemo embolization @ [facility] to fu with IR and Heme onc  Dr Tasia Catchings and Dr. Janese Banks.  2. Dm stable, a1c 6.3 stable is on metformin , jardiance, amaryl  check HbA1c,  urine  microalbumin  diabetic diet plan given to pt  adviced regarding hypoglycemia and instructions given to pt today on how to prevent and treat the same if it were to occur. pt acknowledges the plan and voices understanding of the same.  exercise plan given and encouraged.   advice diabetic yearly podiatry, ophthalmology , nutritionist , dental check q 6 months    Problem List Items Addressed This Visit       Endocrine   Type 2 diabetes mellitus with diabetic neuropathy, unspecified ([place]) - Primary     Other   Hypercholesterolemia   Liver lesion     No orders of the defined types were placed in this encounter.    No orders of the defined types  were placed in this encounter.    Follow up plan: Return in about 3 months (around 05/08/2021).

## 2021-02-05 NOTE — Telephone Encounter (Signed)
Informed brother of pts BMB, nothing to eat or drink 8 hours prior, along with appointment details for F/U on 12/6 at 10:30am. Dominica Severin (brother) understood instructions and pt will be there for scan.

## 2021-02-08 DIAGNOSIS — M5459 Other low back pain: Secondary | ICD-10-CM | POA: Diagnosis not present

## 2021-02-19 DIAGNOSIS — M5459 Other low back pain: Secondary | ICD-10-CM | POA: Diagnosis not present

## 2021-02-20 NOTE — Progress Notes (Signed)
Patient on schedule for BM/Iliac bone biopsy 02/27/2021, called and spoke with patient on phone with pre procedure instructions given. Made aware to be here @ 0800, NPO after MN prior to procedure as well as driver post procedure/recovery/discharge , stated understanding.

## 2021-02-26 ENCOUNTER — Other Ambulatory Visit: Payer: Self-pay | Admitting: Student

## 2021-02-26 DIAGNOSIS — M5459 Other low back pain: Secondary | ICD-10-CM | POA: Diagnosis not present

## 2021-02-27 ENCOUNTER — Ambulatory Visit
Admission: RE | Admit: 2021-02-27 | Discharge: 2021-02-27 | Disposition: A | Discharge status: Home / Self Care | Payer: Medicaid Other | Source: Ambulatory Visit | Attending: Oncology | Admitting: Oncology

## 2021-02-27 ENCOUNTER — Other Ambulatory Visit: Payer: Self-pay

## 2021-02-27 DIAGNOSIS — R937 Abnormal findings on diagnostic imaging of other parts of musculoskeletal system: Secondary | ICD-10-CM | POA: Insufficient documentation

## 2021-02-27 DIAGNOSIS — D72822 Plasmacytosis: Secondary | ICD-10-CM | POA: Diagnosis not present

## 2021-02-27 DIAGNOSIS — C220 Liver cell carcinoma: Secondary | ICD-10-CM | POA: Insufficient documentation

## 2021-02-27 DIAGNOSIS — D696 Thrombocytopenia, unspecified: Secondary | ICD-10-CM | POA: Diagnosis not present

## 2021-02-27 LAB — CBC WITH DIFFERENTIAL/PLATELET
Abs Immature Granulocytes: 0.02 10*3/uL (ref 0.00–0.07)
Basophils Absolute: 0.1 10*3/uL (ref 0.0–0.1)
Basophils Relative: 1 %
Eosinophils Absolute: 0.2 10*3/uL (ref 0.0–0.5)
Eosinophils Relative: 3 %
HCT: 46.8 % (ref 39.0–52.0)
Hemoglobin: 16.1 g/dL (ref 13.0–17.0)
Immature Granulocytes: 0 %
Lymphocytes Relative: 30 %
Lymphs Abs: 1.6 10*3/uL (ref 0.7–4.0)
MCH: 33 pg (ref 26.0–34.0)
MCHC: 34.4 g/dL (ref 30.0–36.0)
MCV: 95.9 fL (ref 80.0–100.0)
Monocytes Absolute: 0.7 10*3/uL (ref 0.1–1.0)
Monocytes Relative: 13 %
Neutro Abs: 2.9 10*3/uL (ref 1.7–7.7)
Neutrophils Relative %: 53 %
Platelets: 104 10*3/uL — ABNORMAL LOW (ref 150–400)
RBC: 4.88 MIL/uL (ref 4.22–5.81)
RDW: 14 % (ref 11.5–15.5)
WBC: 5.5 10*3/uL (ref 4.0–10.5)
nRBC: 0 % (ref 0.0–0.2)

## 2021-02-27 LAB — GLUCOSE, CAPILLARY: Glucose-Capillary: 198 mg/dL — ABNORMAL HIGH (ref 70–99)

## 2021-02-27 MED ORDER — MIDAZOLAM HCL 2 MG/2ML IJ SOLN
INTRAMUSCULAR | Status: AC
  Filled 2021-02-27: qty 4

## 2021-02-27 MED ORDER — FENTANYL CITRATE (PF) 100 MCG/2ML IJ SOLN
INTRAMUSCULAR | Status: AC
  Filled 2021-02-27: qty 2

## 2021-02-27 MED ORDER — MIDAZOLAM HCL 5 MG/5ML IJ SOLN
INTRAMUSCULAR | Status: AC | PRN
  Administered 2021-02-27 (×2): 1 mg via INTRAVENOUS

## 2021-02-27 MED ORDER — SODIUM CHLORIDE 0.9 % IV SOLN: INTRAVENOUS | Status: DC

## 2021-02-27 MED ORDER — FENTANYL CITRATE (PF) 100 MCG/2ML IJ SOLN
INTRAMUSCULAR | Status: AC | PRN
Start: 2021-02-27 — End: 2021-02-27
  Administered 2021-02-27 (×2): 50 ug via INTRAVENOUS

## 2021-02-27 NOTE — Progress Notes (Signed)
Patient clinically stable post BMB per Dr Vernard Gambles, tolerated well. Received Versed 2 mg along with Fentanyl 100 mcg IV for procedure. Awake/alert and oriented post procedure. Report given to Lorella Nimrod RN post procedure/specials.

## 2021-02-27 NOTE — Procedures (Signed)
  Procedure: CT bone marrow biopsy  R iliac, 2 add cores in formalin to r/o mets EBL:   minimal Complications:  none immediate  See full dictation in [facility].  Dillard Cannon MD Main # [phone] Pager  [phone] Mobile [phone]

## 2021-02-28 DIAGNOSIS — M5459 Other low back pain: Secondary | ICD-10-CM | POA: Diagnosis not present

## 2021-02-28 LAB — SURGICAL PATHOLOGY

## 2021-03-04 ENCOUNTER — Other Ambulatory Visit: Payer: Self-pay | Admitting: Internal Medicine

## 2021-03-04 NOTE — Telephone Encounter (Signed)
Requested Prescriptions  Pending Prescriptions Disp Refills  . JARDIANCE 10 MG TABS tablet [Pharmacy Med Name: JARDIANCE 10 MG TABLET] 30 tablet 2    Sig: TAKE 1 TABLET BY MOUTH EVERY DAY BEFORE BREAKFAST     Endocrinology:  Diabetes - SGLT2 Inhibitors Passed - 03/04/2021  1:28 AM      Passed - Cr in normal range and within 360 days    Creatinine, Ser  Date Value Ref Range Status  12/19/2020 0.96 0.76 - 1.27 mg/dL Final         Passed - LDL in normal range and within 360 days    LDL Chol Calc (NIH)  Date Value Ref Range Status  12/19/2020 75 0 - 99 mg/dL Final         Passed - HBA1C is between 0 and 7.9 and within 180 days    HB A1C (BAYER DCA - WAIVED)  Date Value Ref Range Status  12/19/2020 6.3 (H) 4.8 - 5.6 % Final    Comment:             Prediabetes: 5.7 - 6.4          Diabetes: >6.4          Glycemic control for adults with diabetes: <7.0               **Please note reference interval change**          Passed - eGFR in normal range and within 360 days    GFR calc Af Amer  Date Value Ref Range Status  10/10/2019 >60 >60 mL/min Final   GFR, Estimated  Date Value Ref Range Status  12/18/2020 >60 >60 mL/min Final    Comment:    (NOTE) Calculated using the CKD-EPI Creatinine Equation (2021)    eGFR  Date Value Ref Range Status  12/19/2020 92 >59 mL/min/1.73 Final         Passed - Valid encounter within last 6 months    Recent Outpatient Visits          3 weeks ago Type 2 diabetes mellitus with diabetic neuropathy, without long-term current use of insulin ([place])   [facility] Vigg, Avanti, MD   2 months ago Diabetes mellitus without complication ([place])   [facility] Vigg, Avanti, MD   5 months ago Hypercholesterolemia   [facility] Vigg, Avanti, MD   6 months ago Hepatocellular carcinoma ([place])   [facility] Vigg, Avanti, MD   6 months ago Screen for colon cancer   [facility] Vigg, Avanti, MD       Future Appointments            In 1 month Ralene Bathe, MD [place]   In 2 months Vigg, Avanti, MD [facility], [place]   In 9 months Hollice Espy, MD [place]

## 2021-03-05 ENCOUNTER — Telehealth: Payer: Self-pay

## 2021-03-05 ENCOUNTER — Other Ambulatory Visit: Payer: Self-pay

## 2021-03-05 ENCOUNTER — Inpatient Hospital Stay: Payer: Medicaid Other | Attending: Oncology | Admitting: Oncology

## 2021-03-05 ENCOUNTER — Encounter: Payer: Self-pay | Admitting: Oncology

## 2021-03-05 VITALS — BP 204/112 | HR 88 | Temp 99.2°F | Resp 16 | Ht 73.0 in | Wt 209.0 lb

## 2021-03-05 DIAGNOSIS — F17210 Nicotine dependence, cigarettes, uncomplicated: Secondary | ICD-10-CM | POA: Insufficient documentation

## 2021-03-05 DIAGNOSIS — C220 Liver cell carcinoma: Secondary | ICD-10-CM

## 2021-03-05 DIAGNOSIS — G959 Disease of spinal cord, unspecified: Secondary | ICD-10-CM | POA: Diagnosis not present

## 2021-03-05 DIAGNOSIS — R937 Abnormal findings on diagnostic imaging of other parts of musculoskeletal system: Secondary | ICD-10-CM | POA: Diagnosis not present

## 2021-03-05 LAB — SURGICAL PATHOLOGY

## 2021-03-05 NOTE — Progress Notes (Signed)
Hematology/Oncology Consult note [facility]  [phone] [phone]  Patient Care Team: Charlynne Cousins, MD as PCP - General (Internal Medicine) Clent Jacks, RN as Oncology Nurse Navigator   Name of the patient: Johnny Kelley  [id number]  Mar 16, 1966   Date of visit: 03/05/21  Diagnosis- 1.  Nonmetastatic hepatocellular carcinoma 2.  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  He was noted to have abnormal LFTs.  In July 2021 he was found to have an elevated bilirubin of 1.9 with an AST ALT of 128 and 86 respectively and underwent a right upper quadrant ultrasound which showed a heterogeneous abnormality in the right hepatic lobe measuring 4.1 x 4.1 x 3.5 cm.  He was then seen by Dr. Allen Norris and underwent MRI liver with and without contrast which showed a 3.8 cm enhancing lesion in segment 6 compatible with [place] lie RADS 5.  He also underwent work-up for abnormal LFTs and was found to have hepatitis C and will be starting treatment for that soon.  He has been referred to [place] for further evaluation of liver mass.   Results of blood work from 01/13/2020 reveal a normal albumin level of 3.5.  AST and ALT mildly elevated at 70 and 51 respectively with a total bilirubin of 1.6.  PT/INR was normal.  Platelet count was mildly low at 105.  CT chest with contrast did not reveal any evidence of metastatic disease  Interval history-patient underwent embolization for segment 6 hepatic lesion at [facility].  Presently complains of pain at the site of bone marrow biopsy as well as chronic low back pain for which she has seen Ortho in the past.  ECOG PS- 1 Pain scale- 5   Review  of systems- Review of Systems  Constitutional:  Positive for malaise/fatigue. Negative for chills, fever and weight loss.  HENT:  Negative for congestion, ear discharge and nosebleeds.   Eyes:  Negative for blurred vision.  Respiratory:  Negative for cough, hemoptysis, sputum production, shortness of breath and wheezing.   Cardiovascular:  Negative for chest pain, palpitations, orthopnea and claudication.  Gastrointestinal:  Negative for abdominal pain, blood in stool, constipation, diarrhea, heartburn, melena, nausea and vomiting.  Genitourinary:  Negative for dysuria, flank pain, frequency, hematuria and urgency.  Musculoskeletal:  Negative for back pain, joint pain and myalgias.  Skin:  Negative for rash.  Neurological:  Negative for dizziness, tingling, focal weakness, seizures, weakness and headaches.  Endo/Heme/Allergies:  Does not bruise/bleed easily.  Psychiatric/Behavioral:  Negative for depression and suicidal ideas. The patient does not have insomnia.      No Active Allergies   Past Medical History:  Diagnosis Date   Cancer, hepatocellular ([place])    Diabetes mellitus without complication ([place])    Hepatitis C    Hypertension    Psoriasis    Stroke ([place])    Hx stroke in 2020   Substance abuse [facility])      Past Surgical History:  Procedure Laterality Date   IR RADIOLOGIST EVAL & MGMT  01/19/2020   IR RADIOLOGIST EVAL & MGMT  01/31/2020   IR RADIOLOGIST EVAL & MGMT  04/12/2020   IR RADIOLOGIST EVAL & MGMT  08/30/2020   KNEE SURGERY Right  Social History   Socioeconomic History   Marital status: Unknown    Spouse name: Not on file   Number of children: Not on file   Years of education: Not on file   Highest education level: Not on file  Occupational History   Occupation: unemployed  Tobacco Use   Smoking status: Every Day    Packs/day: 0.10    Years: 38.00    Pack years: 3.80    Types: Cigarettes   Smokeless tobacco: Never  Vaping Use   Vaping Use: Never  used  Substance and Sexual Activity   Alcohol use: Yes    Alcohol/week: 21.0 standard drinks    Types: 21 Cans of beer per week   Drug use: Yes    Types: Cocaine, Marijuana    Comment: reports marijuana use once per month and cocaine use 1-2 monthper month   Sexual activity: Not Currently  Other Topics Concern   Not on file  Social History Narrative   Not on file   Social Determinants of Health   Financial Resource Strain: Not on file  Food Insecurity: Not on file  Transportation Needs: Not on file  Physical Activity: Not on file  Stress: Not on file  Social Connections: Not on file  Intimate Partner Violence: Not on file    Family History  Problem Relation Age of Onset   Diabetes Mother    Colon cancer Father    Diabetes Father    Kidney disease Maternal Grandmother    Lung cancer Maternal Grandfather    Hyperlipidemia Paternal Grandmother    COPD Paternal Grandfather      Current Outpatient Medications:    clopidogrel (PLAVIX) 75 MG tablet, clopidogrel 75 mg tablet  TAKE 1 TABLET BY MOUTH EVERY DAY, Disp: , Rfl:    clotrimazole (LOTRIMIN) 1 % cream, clotrimazole 1 % topical cream, Disp: , Rfl:    famotidine (PEPCID) 20 MG tablet, TAKE 1 TABLET BY MOUTH EVERYDAY AT BEDTIME, Disp: 30 tablet, Rfl: 3   gabapentin (NEURONTIN) 100 MG capsule, TAKE 1 CAPSULE BY MOUTH EVERY EVENING., Disp: 30 capsule, Rfl: 3   glimepiride (AMARYL) 2 MG tablet, Take 2 mg by mouth every morning., Disp: , Rfl:    JARDIANCE 10 MG TABS tablet, TAKE 1 TABLET BY MOUTH EVERY DAY BEFORE BREAKFAST, Disp: 30 tablet, Rfl: 2   losartan (COZAAR) 50 MG tablet, Take 1 tablet (50 mg total) by mouth daily., Disp: 90 tablet, Rfl: 2   meloxicam (MOBIC) 7.5 MG tablet, Take 7.5 mg by mouth daily., Disp: , Rfl:    metFORMIN (GLUCOPHAGE) 1000 MG tablet, TAKE 1 TABLET (1,000 MG TOTAL) BY MOUTH 2 (TWO) TIMES DAILY WITH A MEAL., Disp: 60 tablet, Rfl: 2   tamsulosin (FLOMAX) 0.4 MG CAPS capsule, Take 1 capsule (0.4 mg  total) by mouth daily., Disp: 30 capsule, Rfl: 11   terbinafine (LAMISIL) 250 MG tablet, Take 1 tablet (250 mg total) by mouth daily., Disp: 30 tablet, Rfl: 1   aspirin 325 MG tablet, Take 1 tablet (325 mg total) by mouth daily. (Patient not taking: Reported on 03/05/2021), Disp: 120 tablet, Rfl: 0   blood glucose meter kit and supplies KIT, Dispense based on patient and insurance preference. Use up to four times daily as directed. (FOR ICD-9 250.00, 250.01). (Patient not taking: Reported on 03/05/2021), Disp: 1 each, Rfl: 0   lidocaine (LIDODERM) 5 %, Place 1 patch onto the skin daily. Remove & Discard patch within 12 hours or as directed by MD (  Patient not taking: Reported on 12/19/2020), Disp: 30 patch, Rfl: 3   nicotine (NICODERM CQ - DOSED IN MG/24 HOURS) 14 mg/24hr patch, Place 1 patch (14 mg total) onto the skin daily. (Patient not taking: Reported on 03/05/2021), Disp: 28 patch, Rfl: 1   tiZANidine (ZANAFLEX) 4 MG tablet, Take 4 mg by mouth every 6 (six) hours as needed., Disp: , Rfl:   Physical exam:  Vitals:   03/05/21 1052  BP: (!) 204/112  Pulse: 88  Resp: 16  Temp: 99.2 F (37.3 C)  TempSrc: Tympanic  Weight: 209 lb (94.8 kg)  Height: _0  (1.854 m)   Physical Exam Constitutional:      General: He is not in acute distress. Cardiovascular:     Rate and Rhythm: Normal rate and regular rhythm.     Heart sounds: Normal heart sounds.  Pulmonary:     Effort: Pulmonary effort is normal.     Breath sounds: Normal breath sounds.  Abdominal:     General: Bowel sounds are normal.     Palpations: Abdomen is soft.  Skin:    General: Skin is warm and dry.  Neurological:     Mental Status: He is alert and oriented to person, place, and time.     CMP Latest Ref Rng & Units 12/19/2020  Glucose 65 - 99 mg/dL 181(H)  BUN 6 - 24 mg/dL 13  Creatinine 0.76 - 1.27 mg/dL 0.96  Sodium 134 - 144 mmol/L 137  Potassium 3.5 - 5.2 mmol/L 4.6  Chloride 96 - 106 mmol/L 100  CO2 20 - 29 mmol/L  19(L)  Calcium 8.7 - 10.2 mg/dL 9.9  Total Protein 6.5 - 8.1 g/dL -  Total Bilirubin 0.3 - 1.2 mg/dL -  Alkaline Phos 38 - 126 U/L -  AST 15 - 41 U/L -  ALT 0 - 44 U/L -   CBC Latest Ref Rng & Units 02/27/2021  WBC 4.0 - 10.5 K/uL 5.5  Hemoglobin 13.0 - 17.0 g/dL 16.1  Hematocrit 39.0 - 52.0 % 46.8  Platelets 150 - 400 K/uL 104(L)    No images are attached to the encounter.  CT BONE MARROW BIOPSY & ASPIRATION  Result Date: 02/27/2021 CLINICAL DATA:  History of hepatocellular carcinoma. Bone marrow were abnormalities noted on MR lumbar spine. EXAM: CT GUIDED DEEP ILIAC BONE ASPIRATION AND CORE BIOPSY TECHNIQUE: Patient was placed prone on the CT gantry and limited axial scans through the pelvis were obtained. Appropriate skin entry site was identified. Skin site was marked, prepped with chlorhexidine, draped in usual sterile fashion, and infiltrated locally with 1% lidocaine. Intravenous Fentanyl 175mg and Versed 257mwere administered as conscious sedation during continuous monitoring of the patient's level of consciousness and physiological / cardiorespiratory status by the radiology RN, with a total moderate sedation time of 12 minutes. Under CT fluoroscopic guidance an 11-gauge Cook trocar bone needle was advanced into the right iliac bone just lateral to the sacroiliac joint. Once needle tip position was confirmed, core and aspiration samples were obtained, submitted to pathology for approval. 2 additional solid-appearing core samples were obtained and submitted in formalin for surgical pathology. Patient tolerated procedure well. COMPLICATIONS: COMPLICATIONS none IMPRESSION: 1. Technically successful CT guided right iliac bone core and aspiration biopsy. Electronically Signed   By: D Lucrezia Europe.D.   On: 02/27/2021 12:29     Assessment and plan- Patient is a [age].o. male who is here for follow-up of following issues:  Nonmetastatic hepatocellular carcinoma: S/p liver directed  embolization for segment 6 in November 2022.  We will reach out to [facility] to see if they would be following up with a repeat MRI versus getting it done here.  I will tentatively see him back in 2 months with CBC with differential CMP and AFP. Infiltrative lesion noted in the lumbar spine: This was not seen on MRI abdomen that was done here in September 2022.  Patient noted to have ill-defined infiltrating lesion in his lumbar spine.  This was biopsied.  Patient underwent both bone and bone marrow biopsy.  Bone biopsy does not show any evidence of malignancy but bone marrow biopsy results are still pending.  I will follow-up on these results after I get back in about 3 weeks time but if there are any concerning findings on the bone marrow one of my covering partners will see if anything further needs to be done.  Until we know the results of the bone marrow biopsy I would like to hold off on recommending steroid injections in his spine through [facility].   Visit Diagnosis 1. Hepatocellular carcinoma ([place])   2. Abnormal MRI, lumbar spine      Dr. Randa Evens, MD, MPH [facility] [phone] 03/05/2021 1:43 PM

## 2021-03-05 NOTE — Progress Notes (Signed)
Pt her to go over pathology from bx and his embolization.

## 2021-03-05 NOTE — Telephone Encounter (Signed)
Retook pts BP 199/100. Pt states he did not take his BP medication this morning. Pt went home to take his med, stated he feels fine.

## 2021-03-07 ENCOUNTER — Encounter (HOSPITAL_COMMUNITY): Payer: Self-pay | Admitting: Oncology

## 2021-03-07 DIAGNOSIS — M5459 Other low back pain: Secondary | ICD-10-CM | POA: Diagnosis not present

## 2021-03-11 ENCOUNTER — Encounter (HOSPITAL_COMMUNITY): Payer: Self-pay | Admitting: Oncology

## 2021-03-12 ENCOUNTER — Telehealth: Payer: Self-pay | Admitting: *Deleted

## 2021-03-12 DIAGNOSIS — C9030 Solitary plasmacytoma not having achieved remission: Secondary | ICD-10-CM

## 2021-03-12 DIAGNOSIS — D72822 Plasmacytosis: Secondary | ICD-10-CM

## 2021-03-12 LAB — SURGICAL PATHOLOGY

## 2021-03-12 NOTE — Telephone Encounter (Signed)
Called pt and spoke to brother also about the bone marrow showed plasma cells 7%. I showed the results to Dr. 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 Johnny Kelley and the pt agreeable for labs 12/27

## 2021-03-15 DIAGNOSIS — M5459 Other low back pain: Secondary | ICD-10-CM | POA: Diagnosis not present

## 2021-03-22 DIAGNOSIS — M5459 Other low back pain: Secondary | ICD-10-CM | POA: Diagnosis not present

## 2021-03-26 ENCOUNTER — Other Ambulatory Visit: Payer: Self-pay

## 2021-03-26 ENCOUNTER — Inpatient Hospital Stay: Payer: Medicaid Other

## 2021-03-26 DIAGNOSIS — C220 Liver cell carcinoma: Secondary | ICD-10-CM | POA: Diagnosis not present

## 2021-03-26 DIAGNOSIS — D72822 Plasmacytosis: Secondary | ICD-10-CM

## 2021-03-27 LAB — KAPPA/LAMBDA LIGHT CHAINS
Kappa free light chain: 56.2 mg/L — ABNORMAL HIGH (ref 3.3–19.4)
Kappa, lambda light chain ratio: 1.44 (ref 0.26–1.65)
Lambda free light chains: 39.1 mg/L — ABNORMAL HIGH (ref 5.7–26.3)

## 2021-03-28 ENCOUNTER — Other Ambulatory Visit: Payer: Self-pay

## 2021-03-28 DIAGNOSIS — M5459 Other low back pain: Secondary | ICD-10-CM | POA: Diagnosis not present

## 2021-03-28 DIAGNOSIS — R937 Abnormal findings on diagnostic imaging of other parts of musculoskeletal system: Secondary | ICD-10-CM

## 2021-03-28 DIAGNOSIS — C220 Liver cell carcinoma: Secondary | ICD-10-CM

## 2021-03-29 LAB — MULTIPLE MYELOMA PANEL, SERUM
Albumin SerPl Elph-Mcnc: 3.3 g/dL (ref 2.9–4.4)
Albumin/Glob SerPl: 0.9 (ref 0.7–1.7)
Alpha 1: 0.2 g/dL (ref 0.0–0.4)
Alpha2 Glob SerPl Elph-Mcnc: 0.5 g/dL (ref 0.4–1.0)
B-Globulin SerPl Elph-Mcnc: 1 g/dL (ref 0.7–1.3)
Gamma Glob SerPl Elph-Mcnc: 2 g/dL — ABNORMAL HIGH (ref 0.4–1.8)
Globulin, Total: 3.7 g/dL (ref 2.2–3.9)
IgA: 730 mg/dL — ABNORMAL HIGH (ref 90–386)
IgG (Immunoglobin G), Serum: 1883 mg/dL — ABNORMAL HIGH (ref 603–1613)
IgM (Immunoglobulin M), Srm: 144 mg/dL (ref 20–172)
Total Protein ELP: 7 g/dL (ref 6.0–8.5)

## 2021-04-02 DIAGNOSIS — M5459 Other low back pain: Secondary | ICD-10-CM | POA: Diagnosis not present

## 2021-04-04 DIAGNOSIS — M5459 Other low back pain: Secondary | ICD-10-CM | POA: Diagnosis not present

## 2021-04-08 DIAGNOSIS — M5459 Other low back pain: Secondary | ICD-10-CM | POA: Diagnosis not present

## 2021-04-11 DIAGNOSIS — M545 Low back pain, unspecified: Secondary | ICD-10-CM | POA: Diagnosis not present

## 2021-04-15 DIAGNOSIS — M5459 Other low back pain: Secondary | ICD-10-CM | POA: Diagnosis not present

## 2021-04-17 ENCOUNTER — Other Ambulatory Visit: Payer: Self-pay

## 2021-04-17 ENCOUNTER — Encounter: Payer: Self-pay | Admitting: Dermatology

## 2021-04-17 ENCOUNTER — Ambulatory Visit (INDEPENDENT_AMBULATORY_CARE_PROVIDER_SITE_OTHER): Payer: Medicaid Other | Admitting: Dermatology

## 2021-04-17 DIAGNOSIS — L570 Actinic keratosis: Secondary | ICD-10-CM | POA: Diagnosis not present

## 2021-04-17 DIAGNOSIS — B353 Tinea pedis: Secondary | ICD-10-CM

## 2021-04-17 DIAGNOSIS — Z7189 Other specified counseling: Secondary | ICD-10-CM

## 2021-04-17 DIAGNOSIS — B351 Tinea unguium: Secondary | ICD-10-CM

## 2021-04-17 DIAGNOSIS — L409 Psoriasis, unspecified: Secondary | ICD-10-CM

## 2021-04-17 DIAGNOSIS — B352 Tinea manuum: Secondary | ICD-10-CM

## 2021-04-17 DIAGNOSIS — L578 Other skin changes due to chronic exposure to nonionizing radiation: Secondary | ICD-10-CM

## 2021-04-17 DIAGNOSIS — L720 Epidermal cyst: Secondary | ICD-10-CM

## 2021-04-17 DIAGNOSIS — L820 Inflamed seborrheic keratosis: Secondary | ICD-10-CM

## 2021-04-17 DIAGNOSIS — M5459 Other low back pain: Secondary | ICD-10-CM | POA: Diagnosis not present

## 2021-04-17 MED ORDER — TERBINAFINE HCL 250 MG PO TABS: ORAL_TABLET | ORAL | 0 refills | Status: DC

## 2021-04-17 NOTE — Progress Notes (Signed)
Follow-Up Visit   Subjective  Johnny Kelley is a 56 y.o. male who presents for the following: Tinea (S/P Lamisil 250mg  po QD x 3 mths - patient has noticed an improvement in his fingernails since starting treatment. ), Lesion  (On the back of the neck - irritated, patient would like treated today.), and Firm nodule (On the back - hx of rupture and drainage, patient would like checked today.). The patient has spots, moles and lesions to be evaluated, some may be new or changing.  The following portions of the chart were reviewed this encounter and updated as appropriate:   Tobacco   Allergies   Meds   Problems   Med Hx   Surg Hx   Fam Hx      Review of Systems:  No other skin or systemic complaints except as noted in HPI or Assessment and Plan.  Objective  Well appearing patient in no apparent distress; mood and affect are within normal limits.  A focused examination was performed including the face, arms, hands, and feet. Relevant physical exam findings are noted in the Assessment and Plan.  the hands, feet, fingernails, and toenails Proximal nail clearing. No scale of the feet.   R upper back 2.2 cm firm SQ nodule.  L neck x 1 Erythematous stuck-on, waxy papule or plaque  Face x 16 (16) Erythematous thin papules/macules with gritty scale.    Assessment & Plan  Tinea manuum, pedis, and unguium the hands, feet, fingernails, and toenails Improving significantly.  No side effects from treatment.  Some nail changes may be psoriasis, but improving with treatment.   Continue Lamisil 250mg  po QD for one more month. Terbinafine Counseling  Terbinafine is an anti-fungal medicine that can be applied to the skin (over the counter) or taken by mouth (prescription) to treat fungal infections. The pill version is often used to treat fungal infections of the nails or scalp. While most people do not have any side effects from taking terbinafine pills, some possible side effects of the  medicine can include taste changes, headache, loss of smell, vision changes, nausea, vomiting, or diarrhea.   Rare side effects can include irritation of the liver, allergic reaction, or decrease in blood counts (which may show up as not feeling well or developing an infection). If you are concerned about any of these side effects, please stop the medicine and call your doctor, or in the case of an emergency such as feeling very unwell, seek immediate medical care.   Psoriasis Trunk, extremities Psoriasis is a chronic non-curable, but treatable genetic/hereditary disease that may have other systemic features affecting other organ systems such as joints (Psoriatic Arthritis). It is associated with an increased risk of inflammatory bowel disease, heart disease, non-alcoholic fatty liver disease, and depression.    Nail changes may be at least partially related to tinea infection. He understands once fungal infection has been treated and nails have 12 months to grow out, if nail changes still evident, the changes are likely related to psoriasis.  Epidermal inclusion cyst R upper back Benign-appearing. Exam most consistent with an epidermal inclusion cyst. Discussed that a cyst is a benign growth that can grow over time and sometimes get irritated or inflamed. Recommend observation if it is not bothersome. Discussed option of surgical excision to remove it if it is growing, symptomatic, or other changes noted. Please call for new or changing lesions so they can be evaluated.  Inflamed seborrheic keratosis L neck x 1 Destruction  of lesion - L neck x 1 Complexity: simple   Destruction method: cryotherapy   Informed consent: discussed and consent obtained   Timeout:  patient name, date of birth, surgical site, and procedure verified Lesion destroyed using liquid nitrogen: Yes   Region frozen until ice ball extended beyond lesion: Yes   Outcome: patient tolerated procedure well with no complications    Post-procedure details: wound care instructions given    AK (actinic keratosis) (16) Face x 16 Destruction of lesion - Face x 16 Complexity: simple   Destruction method: cryotherapy   Informed consent: discussed and consent obtained   Timeout:  patient name, date of birth, surgical site, and procedure verified Lesion destroyed using liquid nitrogen: Yes   Region frozen until ice ball extended beyond lesion: Yes   Outcome: patient tolerated procedure well with no complications   Post-procedure details: wound care instructions given    Actinic Damage - Severe, confluent actinic changes with pre-cancerous actinic keratoses  - Severe, chronic, not at goal, secondary to cumulative UV radiation exposure over time - diffuse scaly erythematous macules and papules with underlying dyspigmentation - Discussed Prescription "Field Treatment" for Severe, Chronic Confluent Actinic Changes with Pre-Cancerous Actinic Keratoses Field treatment involves treatment of an entire area of skin that has confluent Actinic Changes (Sun/ Ultraviolet light damage) and PreCancerous Actinic Keratoses by method of PhotoDynamic Therapy (PDT) and/or prescription Topical Chemotherapy agents such as 5-fluorouracil, 5-fluorouracil/calcipotriene, and/or imiquimod.  The purpose is to decrease the number of clinically evident and subclinical PreCancerous lesions to prevent progression to development of skin cancer by chemically destroying early precancer changes that may or may not be visible.  It has been shown to reduce the risk of developing skin cancer in the treated area. As a result of treatment, redness, scaling, crusting, and open sores may occur during treatment course. One or more than one of these methods may be used and may have to be used several times to control, suppress and eliminate the PreCancerous changes. Discussed treatment course, expected reaction, and possible side effects. - Recommend daily broad spectrum  sunscreen SPF 30+ to sun-exposed areas, reapply every 2 hours as needed.  - Staying in the shade or wearing long sleeves, sun glasses (UVA+UVB protection) and wide brim hats (4-inch brim around the entire circumference of the hat) are also recommended. - Call for new or changing lesions. - In 1 mth recommend PDT of the face  Return in about 6 months (around 10/15/2021) for tinea f/u; 1 mth PDT face and frontal scalp.  Luther Redo, CMA, am acting as scribe for Sarina Ser, MD . Documentation: I have reviewed the above documentation for accuracy and completeness, and I agree with the above.  Sarina Ser, MD

## 2021-04-17 NOTE — Patient Instructions (Addendum)

## 2021-04-22 DIAGNOSIS — M5459 Other low back pain: Secondary | ICD-10-CM | POA: Diagnosis not present

## 2021-04-24 DIAGNOSIS — M5459 Other low back pain: Secondary | ICD-10-CM | POA: Diagnosis not present

## 2021-04-29 DIAGNOSIS — M5459 Other low back pain: Secondary | ICD-10-CM | POA: Diagnosis not present

## 2021-04-30 ENCOUNTER — Ambulatory Visit
Admission: RE | Admit: 2021-04-30 | Discharge: 2021-04-30 | Disposition: A | Discharge status: Home / Self Care | Payer: Medicaid Other | Source: Ambulatory Visit | Attending: Oncology | Admitting: Oncology

## 2021-04-30 ENCOUNTER — Other Ambulatory Visit: Payer: Self-pay

## 2021-04-30 ENCOUNTER — Other Ambulatory Visit: Payer: Self-pay | Admitting: *Deleted

## 2021-04-30 DIAGNOSIS — R937 Abnormal findings on diagnostic imaging of other parts of musculoskeletal system: Secondary | ICD-10-CM | POA: Diagnosis present

## 2021-04-30 DIAGNOSIS — C220 Liver cell carcinoma: Secondary | ICD-10-CM

## 2021-04-30 DIAGNOSIS — M545 Low back pain, unspecified: Secondary | ICD-10-CM | POA: Diagnosis not present

## 2021-04-30 DIAGNOSIS — M549 Dorsalgia, unspecified: Secondary | ICD-10-CM | POA: Diagnosis not present

## 2021-04-30 DIAGNOSIS — K7460 Unspecified cirrhosis of liver: Secondary | ICD-10-CM | POA: Diagnosis not present

## 2021-04-30 DIAGNOSIS — R161 Splenomegaly, not elsewhere classified: Secondary | ICD-10-CM | POA: Diagnosis not present

## 2021-04-30 DIAGNOSIS — K769 Liver disease, unspecified: Secondary | ICD-10-CM | POA: Diagnosis not present

## 2021-04-30 MED ORDER — GADOBUTROL 1 MMOL/ML IV SOLN
10.0000 mL | Freq: Once | INTRAVENOUS | Status: AC | PRN
  Administered 2021-04-30: 10 mL via INTRAVENOUS

## 2021-05-01 ENCOUNTER — Other Ambulatory Visit: Payer: Medicaid Other

## 2021-05-01 DIAGNOSIS — E7800 Pure hypercholesterolemia, unspecified: Secondary | ICD-10-CM

## 2021-05-01 DIAGNOSIS — M5459 Other low back pain: Secondary | ICD-10-CM | POA: Diagnosis not present

## 2021-05-01 DIAGNOSIS — I10 Essential (primary) hypertension: Secondary | ICD-10-CM | POA: Diagnosis not present

## 2021-05-01 DIAGNOSIS — E119 Type 2 diabetes mellitus without complications: Secondary | ICD-10-CM | POA: Diagnosis not present

## 2021-05-01 LAB — MICROALBUMIN, URINE WAIVED
Creatinine, Urine Waived: 50 mg/dL (ref 10–300)
Microalb, Ur Waived: 10 mg/L (ref 0–19)
Microalb/Creat Ratio: <30 mg/g (ref <30)

## 2021-05-02 LAB — COMPREHENSIVE METABOLIC PANEL
ALT: 44 IU/L (ref 0–44)
AST: 62 IU/L — ABNORMAL HIGH (ref 0–40)
Albumin/Globulin Ratio: 1.1 — ABNORMAL LOW (ref 1.2–2.2)
Albumin: 3.7 g/dL — ABNORMAL LOW (ref 3.8–4.9)
Alkaline Phosphatase: 164 IU/L — ABNORMAL HIGH (ref 44–121)
BUN/Creatinine Ratio: 14 (ref 9–20)
BUN: 12 mg/dL (ref 6–24)
Bilirubin Total: 1.5 mg/dL — ABNORMAL HIGH (ref 0.0–1.2)
CO2: 23 mmol/L (ref 20–29)
Calcium: 9.5 mg/dL (ref 8.7–10.2)
Chloride: 103 mmol/L (ref 96–106)
Creatinine, Ser: 0.87 mg/dL (ref 0.76–1.27)
Globulin, Total: 3.5 g/dL (ref 1.5–4.5)
Glucose: 187 mg/dL — ABNORMAL HIGH (ref 70–99)
Potassium: 4.7 mmol/L (ref 3.5–5.2)
Sodium: 140 mmol/L (ref 134–144)
Total Protein: 7.2 g/dL (ref 6.0–8.5)
eGFR: 101 mL/min/{1.73_m2} (ref >59)

## 2021-05-02 LAB — CBC WITH DIFFERENTIAL/PLATELET
Basophils Absolute: 0 10*3/uL (ref 0.0–0.2)
Basos: 1 %
EOS (ABSOLUTE): 0.2 10*3/uL (ref 0.0–0.4)
Eos: 3 %
Hematocrit: 45 % (ref 37.5–51.0)
Hemoglobin: 15.7 g/dL (ref 13.0–17.7)
Immature Grans (Abs): 0 10*3/uL (ref 0.0–0.1)
Immature Granulocytes: 0 %
Lymphocytes Absolute: 1.5 10*3/uL (ref 0.7–3.1)
Lymphs: 33 %
MCH: 33.4 pg — ABNORMAL HIGH (ref 26.6–33.0)
MCHC: 34.9 g/dL (ref 31.5–35.7)
MCV: 96 fL (ref 79–97)
Monocytes Absolute: 0.6 10*3/uL (ref 0.1–0.9)
Monocytes: 14 %
Neutrophils Absolute: 2.2 10*3/uL (ref 1.4–7.0)
Neutrophils: 49 %
Platelets: 91 10*3/uL — CL (ref 150–450)
RBC: 4.7 x10E6/uL (ref 4.14–5.80)
RDW: 12.9 % (ref 11.6–15.4)
WBC: 4.6 10*3/uL (ref 3.4–10.8)

## 2021-05-02 LAB — LIPID PANEL
Chol/HDL Ratio: 2 ratio (ref 0.0–5.0)
Cholesterol, Total: 168 mg/dL (ref 100–199)
HDL: 82 mg/dL (ref >39)
LDL Chol Calc (NIH): 70 mg/dL (ref 0–99)
Triglycerides: 85 mg/dL (ref 0–149)
VLDL Cholesterol Cal: 16 mg/dL (ref 5–40)

## 2021-05-06 DIAGNOSIS — M5459 Other low back pain: Secondary | ICD-10-CM | POA: Diagnosis not present

## 2021-05-07 ENCOUNTER — Inpatient Hospital Stay: Payer: Medicaid Other | Attending: Oncology

## 2021-05-07 ENCOUNTER — Inpatient Hospital Stay (HOSPITAL_BASED_OUTPATIENT_CLINIC_OR_DEPARTMENT_OTHER): Payer: Medicaid Other | Admitting: Oncology

## 2021-05-07 ENCOUNTER — Encounter: Payer: Self-pay | Admitting: Oncology

## 2021-05-07 ENCOUNTER — Other Ambulatory Visit: Payer: Self-pay

## 2021-05-07 VITALS — BP 165/89 | HR 75 | Temp 97.5°F | Resp 16 | Ht 73.0 in | Wt 217.0 lb

## 2021-05-07 DIAGNOSIS — C220 Liver cell carcinoma: Secondary | ICD-10-CM | POA: Insufficient documentation

## 2021-05-07 DIAGNOSIS — R930 Abnormal findings on diagnostic imaging of skull and head, not elsewhere classified: Secondary | ICD-10-CM | POA: Insufficient documentation

## 2021-05-07 DIAGNOSIS — R937 Abnormal findings on diagnostic imaging of other parts of musculoskeletal system: Secondary | ICD-10-CM

## 2021-05-07 LAB — CBC WITH DIFFERENTIAL/PLATELET
Abs Immature Granulocytes: 0.01 10*3/uL (ref 0.00–0.07)
Basophils Absolute: 0 10*3/uL (ref 0.0–0.1)
Basophils Relative: 1 %
Eosinophils Absolute: 0.1 10*3/uL (ref 0.0–0.5)
Eosinophils Relative: 2 %
HCT: 47.4 % (ref 39.0–52.0)
Hemoglobin: 16.1 g/dL (ref 13.0–17.0)
Immature Granulocytes: 0 %
Lymphocytes Relative: 33 %
Lymphs Abs: 1.5 10*3/uL (ref 0.7–4.0)
MCH: 33.6 pg (ref 26.0–34.0)
MCHC: 34 g/dL (ref 30.0–36.0)
MCV: 99 fL (ref 80.0–100.0)
Monocytes Absolute: 0.6 10*3/uL (ref 0.1–1.0)
Monocytes Relative: 12 %
Neutro Abs: 2.5 10*3/uL (ref 1.7–7.7)
Neutrophils Relative %: 52 %
Platelets: 86 10*3/uL — ABNORMAL LOW (ref 150–400)
RBC: 4.79 MIL/uL (ref 4.22–5.81)
RDW: 14.4 % (ref 11.5–15.5)
WBC: 4.7 10*3/uL (ref 4.0–10.5)
nRBC: 0 % (ref 0.0–0.2)

## 2021-05-07 LAB — COMPREHENSIVE METABOLIC PANEL
ALT: 47 U/L — ABNORMAL HIGH (ref 0–44)
AST: 79 U/L — ABNORMAL HIGH (ref 15–41)
Albumin: 3.5 g/dL (ref 3.5–5.0)
Alkaline Phosphatase: 102 U/L (ref 38–126)
Anion gap: 11 (ref 5–15)
BUN: 11 mg/dL (ref 6–20)
CO2: 21 mmol/L — ABNORMAL LOW (ref 22–32)
Calcium: 9 mg/dL (ref 8.9–10.3)
Chloride: 101 mmol/L (ref 98–111)
Creatinine, Ser: 0.77 mg/dL (ref 0.61–1.24)
GFR, Estimated: >60 mL/min (ref >60)
Glucose, Bld: 244 mg/dL — ABNORMAL HIGH (ref 70–99)
Potassium: 4.7 mmol/L (ref 3.5–5.1)
Sodium: 133 mmol/L — ABNORMAL LOW (ref 135–145)
Total Bilirubin: 1.6 mg/dL — ABNORMAL HIGH (ref 0.3–1.2)
Total Protein: 7.8 g/dL (ref 6.5–8.1)

## 2021-05-07 NOTE — Progress Notes (Signed)
Hematology/Oncology Consult note [facility]  Telephone:([phone] [phone]  Patient Care Team: Charlynne Cousins, MD as PCP - General (Internal Medicine) Clent Jacks, RN as Oncology Nurse Navigator   Name of the patient: Johnny Kelley  [id number]  09-19-1965   Date of visit: 05/07/21  Diagnosis- 1.  Nonmetastatic hepatocellular carcinoma 2.  Infiltrative lesion of the lumbar spine of unclear etiology  Chief complaint/ Reason for visit-discussed MRI results and further management  Heme/Onc history:  Patient is a 55 year old male with past medical history significant for hepatitis C infection as well as significant alcohol intake.  He was noted to have abnormal LFTs.  In July 2021 he was found to have an elevated bilirubin of 1.9 with an AST ALT of 128 and 86 respectively and underwent a right upper quadrant ultrasound which showed a heterogeneous abnormality in the right hepatic lobe measuring 4.1 x 4.1 x 3.5 cm.  He was then seen by Dr. Allen Norris and underwent MRI liver with and without contrast which showed a 3.8 cm enhancing lesion in segment 6 compatible with [place] lie RADS 5.     Results of blood work from 01/13/2020 reveal a normal albumin level of 3.5.  AST and ALT mildly elevated at 70 and 51 respectively with a total bilirubin of 1.6.  PT/INR was normal.  Platelet count was mildly low at 105.  CT chest with contrast did not reveal any evidence of metastatic disease.  Patient underwent embolization for segment 6 hepatic lesion with interventional radiology at [facility] in November 2022.  Interval history-patient is drinking about 2 drinks per day which has gone down as compared to 4-6 drinks daily prior.  He has ongoing low back pain which is currently being managed by orthopedics.  ECOG PS- 2 Pain scale- 4   Review of systems- Review of Systems  Constitutional:  Positive for malaise/fatigue. Negative for chills, fever and weight loss.  HENT:   Negative for congestion, ear discharge and nosebleeds.   Eyes:  Negative for blurred vision.  Respiratory:  Negative for cough, hemoptysis, sputum production, shortness of breath and wheezing.   Cardiovascular:  Negative for chest pain, palpitations, orthopnea and claudication.  Gastrointestinal:  Negative for abdominal pain, blood in stool, constipation, diarrhea, heartburn, melena, nausea and vomiting.  Genitourinary:  Negative for dysuria, flank pain, frequency, hematuria and urgency.  Musculoskeletal:  Positive for back pain. Negative for joint pain and myalgias.  Skin:  Negative for rash.  Neurological:  Negative for dizziness, tingling, focal weakness, seizures, weakness and headaches.  Endo/Heme/Allergies:  Does not bruise/bleed easily.  Psychiatric/Behavioral:  Negative for depression and suicidal ideas. The patient does not have insomnia.       No Known Allergies   Past Medical History:  Diagnosis Date   Cancer, hepatocellular ([place])    Diabetes mellitus without complication ([place])    Hepatitis C    Hypertension    Psoriasis    Stroke ([place])    Hx stroke in 2020   Substance abuse [facility])      Past Surgical History:  Procedure Laterality Date   IR RADIOLOGIST EVAL & MGMT  01/19/2020   IR RADIOLOGIST EVAL & MGMT  01/31/2020   IR RADIOLOGIST EVAL & MGMT  04/12/2020   IR RADIOLOGIST EVAL & MGMT  08/30/2020   KNEE SURGERY Right     Social History   Socioeconomic History   Marital status: Unknown    Spouse name: Not on file   Number  of children: Not on file   Years of education: Not on file   Highest education level: Not on file  Occupational History   Occupation: unemployed  Tobacco Use   Smoking status: Every Day    Packs/day: 0.10    Years: 38.00    Pack years: 3.80    Types: Cigarettes   Smokeless tobacco: Never  Vaping Use   Vaping Use: Never used  Substance and Sexual Activity   Alcohol use: Yes    Alcohol/week: 21.0 standard drinks    Types: 21 Cans of  beer per week   Drug use: Yes    Types: Cocaine, Marijuana    Comment: reports marijuana use once per month and cocaine use 1-2 monthper month   Sexual activity: Not Currently  Other Topics Concern   Not on file  Social History Narrative   Not on file   Social Determinants of Health   Financial Resource Strain: Not on file  Food Insecurity: Not on file  Transportation Needs: Not on file  Physical Activity: Not on file  Stress: Not on file  Social Connections: Not on file  Intimate Partner Violence: Not on file    Family History  Problem Relation Age of Onset   Diabetes Mother    Colon cancer Father    Diabetes Father    Kidney disease Maternal Grandmother    Lung cancer Maternal Grandfather    Hyperlipidemia Paternal Grandmother    COPD Paternal Grandfather      Current Outpatient Medications:    clopidogrel (PLAVIX) 75 MG tablet, clopidogrel 75 mg tablet  TAKE 1 TABLET BY MOUTH EVERY DAY, Disp: , Rfl:    clotrimazole (LOTRIMIN) 1 % cream, clotrimazole 1 % topical cream, Disp: , Rfl:    famotidine (PEPCID) 20 MG tablet, TAKE 1 TABLET BY MOUTH EVERYDAY AT BEDTIME, Disp: 30 tablet, Rfl: 3   gabapentin (NEURONTIN) 100 MG capsule, TAKE 1 CAPSULE BY MOUTH EVERY EVENING., Disp: 30 capsule, Rfl: 3   glimepiride (AMARYL) 2 MG tablet, Take 2 mg by mouth every morning., Disp: , Rfl:    JARDIANCE 10 MG TABS tablet, TAKE 1 TABLET BY MOUTH EVERY DAY BEFORE BREAKFAST, Disp: 30 tablet, Rfl: 2   losartan (COZAAR) 50 MG tablet, Take 1 tablet (50 mg total) by mouth daily., Disp: 90 tablet, Rfl: 2   meloxicam (MOBIC) 7.5 MG tablet, Take 7.5 mg by mouth daily., Disp: , Rfl:    metFORMIN (GLUCOPHAGE) 1000 MG tablet, TAKE 1 TABLET (1,000 MG TOTAL) BY MOUTH 2 (TWO) TIMES DAILY WITH A MEAL., Disp: 60 tablet, Rfl: 2   tamsulosin (FLOMAX) 0.4 MG CAPS capsule, Take 1 capsule (0.4 mg total) by mouth daily., Disp: 30 capsule, Rfl: 11   terbinafine (LAMISIL) 250 MG tablet, Take one tab po QD., Disp: 30  tablet, Rfl: 0   tiZANidine (ZANAFLEX) 4 MG tablet, Take 4 mg by mouth every 6 (six) hours as needed., Disp: , Rfl:    aspirin 325 MG tablet, Take 1 tablet (325 mg total) by mouth daily. (Patient not taking: Reported on 03/05/2021), Disp: 120 tablet, Rfl: 0   blood glucose meter kit and supplies KIT, Dispense based on patient and insurance preference. Use up to four times daily as directed. (FOR ICD-9 250.00, 250.01). (Patient not taking: Reported on 03/05/2021), Disp: 1 each, Rfl: 0   lidocaine (LIDODERM) 5 %, Place 1 patch onto the skin daily. Remove & Discard patch within 12 hours or as directed by MD (Patient not taking: Reported  on 12/19/2020), Disp: 30 patch, Rfl: 3   nicotine (NICODERM CQ - DOSED IN MG/24 HOURS) 14 mg/24hr patch, Place 1 patch (14 mg total) onto the skin daily. (Patient not taking: Reported on 03/05/2021), Disp: 28 patch, Rfl: 1  Physical exam:  Vitals:   05/07/21 1123  BP: (!) 165/89  Pulse: 75  Resp: 16  Temp: (!) 97.5 F (36.4 C)  TempSrc: Tympanic  SpO2: 94%  Weight: 217 lb (98.4 kg)  Height: _0  (1.854 m)   Physical Exam Constitutional:      Comments: He has a slow shuffled gait  Cardiovascular:     Rate and Rhythm: Normal rate and regular rhythm.     Heart sounds: Normal heart sounds.  Pulmonary:     Effort: Pulmonary effort is normal.     Breath sounds: Normal breath sounds.  Abdominal:     General: Bowel sounds are normal.     Palpations: Abdomen is soft.  Skin:    General: Skin is warm and dry.  Neurological:     Mental Status: He is alert and oriented to person, place, and time.     CMP Latest Ref Rng & Units 05/07/2021  Glucose 70 - 99 mg/dL 244(H)  BUN 6 - 20 mg/dL 11  Creatinine 0.61 - 1.24 mg/dL 0.77  Sodium 135 - 145 mmol/L 133(L)  Potassium 3.5 - 5.1 mmol/L 4.7  Chloride 98 - 111 mmol/L 101  CO2 22 - 32 mmol/L 21(L)  Calcium 8.9 - 10.3 mg/dL 9.0  Total Protein 6.5 - 8.1 g/dL 7.8  Total Bilirubin 0.3 - 1.2 mg/dL 1.6(H)  Alkaline Phos  38 - 126 U/L 102  AST 15 - 41 U/L 79(H)  ALT 0 - 44 U/L 47(H)   CBC Latest Ref Rng & Units 05/07/2021  WBC 4.0 - 10.5 K/uL 4.7  Hemoglobin 13.0 - 17.0 g/dL 16.1  Hematocrit 39.0 - 52.0 % 47.4  Platelets 150 - 400 K/uL 86(L)    No images are attached to the encounter.  MR Thoracic Spine W Wo Contrast  Result Date: 05/01/2021 CLINICAL DATA:  Infiltrative lesion of lumbar spine unclear etiology. History of liver cancer. Mid lower back pain with bilateral leg pain EXAM: MRI THORACIC AND LUMBAR SPINE WITHOUT AND WITH CONTRAST TECHNIQUE: Multiplanar and multiecho pulse sequences of the thoracic and lumbar spine were obtained without and with intravenous contrast. CONTRAST:  31m GADAVIST GADOBUTROL 1 MMOL/ML IV SOLN COMPARISON:  11/28/2020 lumbar MRI at outside facility FINDINGS: MRI THORACIC SPINE FINDINGS Alignment:  Physiologic Vertebrae: Diffuse heterogeneity of marrow. Rounded area of hypointensity at T8 which matches the signal of darker marrow throughout the thoracic spine. No destructive changes, fracture, or inflammatory process. Cord:  Normal signal and morphology Paraspinal and other soft tissues: Negative Disc levels: Diffusely preserved disc height and hydration. Central disc protrusion at T6-7 contacting but not compressing the cord. MRI LUMBAR SPINE FINDINGS Segmentation:  Standard. Alignment:  Unremarkable Vertebrae: Heterogeneity of marrow diffusely. There is a more discrete rounded area measuring 1 cm in the L2 body which is stable from prior and isointense to the areas of darker marrow signal, but none of which are darker than disc space. No focal lesion, fracture, or inflammatory changes Conus medullaris: Extends to the L1 level and appears normal. Paraspinal and other soft tissues: Negative for perispinal mass or inflammation. Disc levels: T12- L1: Unremarkable. L1-L2: Ventral spondylitic spurring and mild disc height loss L2-L3: Mild disc bulging L3-L4: Disc narrowing and bulging.  Mild  facet  spurring. L4-L5: Disc narrowing and bulging with annular fissuring. Facet and ligamentum flavum hypertrophy. Right facet joint effusion. High-grade spinal stenosis L5-S1:Incomplete segmentation of the transverse processes. No degenerative changes or impingement. IMPRESSION: 1. Heterogeneity of marrow throughout the thoracic and lumbar spine, usually red marrow reconversion or sometimes related to lymphoproliferative disease. The lumbar spine is stable from outside imaging 01/10/2021. 2. Possibly related to the patient's lower extremity symptoms, there is high-grade degenerative spinal stenosis at L4-5. Electronically Signed   By: Jorje Guild M.D.   On: 05/01/2021 06:43   MR Lumbar Spine W Wo Contrast  Result Date: 05/01/2021 CLINICAL DATA:  Infiltrative lesion of lumbar spine unclear etiology. History of liver cancer. Mid lower back pain with bilateral leg pain EXAM: MRI THORACIC AND LUMBAR SPINE WITHOUT AND WITH CONTRAST TECHNIQUE: Multiplanar and multiecho pulse sequences of the thoracic and lumbar spine were obtained without and with intravenous contrast. CONTRAST:  12m GADAVIST GADOBUTROL 1 MMOL/ML IV SOLN COMPARISON:  11/28/2020 lumbar MRI at outside facility FINDINGS: MRI THORACIC SPINE FINDINGS Alignment:  Physiologic Vertebrae: Diffuse heterogeneity of marrow. Rounded area of hypointensity at T8 which matches the signal of darker marrow throughout the thoracic spine. No destructive changes, fracture, or inflammatory process. Cord:  Normal signal and morphology Paraspinal and other soft tissues: Negative Disc levels: Diffusely preserved disc height and hydration. Central disc protrusion at T6-7 contacting but not compressing the cord. MRI LUMBAR SPINE FINDINGS Segmentation:  Standard. Alignment:  Unremarkable Vertebrae: Heterogeneity of marrow diffusely. There is a more discrete rounded area measuring 1 cm in the L2 body which is stable from prior and isointense to the areas of darker marrow  signal, but none of which are darker than disc space. No focal lesion, fracture, or inflammatory changes Conus medullaris: Extends to the L1 level and appears normal. Paraspinal and other soft tissues: Negative for perispinal mass or inflammation. Disc levels: T12- L1: Unremarkable. L1-L2: Ventral spondylitic spurring and mild disc height loss L2-L3: Mild disc bulging L3-L4: Disc narrowing and bulging.  Mild facet spurring. L4-L5: Disc narrowing and bulging with annular fissuring. Facet and ligamentum flavum hypertrophy. Right facet joint effusion. High-grade spinal stenosis L5-S1:Incomplete segmentation of the transverse processes. No degenerative changes or impingement. IMPRESSION: 1. Heterogeneity of marrow throughout the thoracic and lumbar spine, usually red marrow reconversion or sometimes related to lymphoproliferative disease. The lumbar spine is stable from outside imaging 01/10/2021. 2. Possibly related to the patient's lower extremity symptoms, there is high-grade degenerative spinal stenosis at L4-5. Electronically Signed   By: JJorje GuildM.D.   On: 05/01/2021 06:43   MR Abdomen W Wo Contrast  Result Date: 05/01/2021 CLINICAL DATA:  Hepatocellular carcinoma EXAM: MRI ABDOMEN WITHOUT AND WITH CONTRAST TECHNIQUE: Multiplanar multisequence MR imaging of the abdomen was performed both before and after the administration of intravenous contrast. CONTRAST:  129mGADAVIST GADOBUTROL 1 MMOL/ML IV SOLN COMPARISON:  12/13/2020 FINDINGS: Lower chest: No acute findings. Hepatobiliary: Coarse, nodular cirrhotic morphology of the liver. Redemonstrated lesion of the posterior right lobe of the liver, hepatic segment VI, is now diminished in size, measuring 3.1 x 2.7 cm, previously 4.1 x 3.5 cm (series 19, image 43). There appears to be a tract and capsular retraction overlying this lesion and there is no longer internal arterial phase contrast enhancement. There is however some residual contrast enhancement at  the superior aspect of this lesion measuring approximately 2.2 x 1.3 cm (series 15, image 40). There are multiple additional subcentimeter arterially hyperenhancing foci seen in the liver  dome, including a 0.7 cm lesion of the posterior liver dome, hepatic segment VII (series 16, image 29). No gallstones. No biliary ductal dilatation. Pancreas: No mass, inflammatory changes, or other parenchymal abnormality identified.No pancreatic ductal dilatation. Spleen:  Mild splenomegaly, maximum coronal span 14.2 cm Adrenals/Urinary Tract: Normal adrenal glands. No renal masses or suspicious contrast enhancement identified. No evidence of hydronephrosis. Stomach/Bowel: Visualized portions within the abdomen are unremarkable. Vascular/Lymphatic: No pathologically enlarged lymph nodes identified. No abdominal aortic aneurysm demonstrated. Other:  None. Musculoskeletal: No suspicious osseous lesions identified. IMPRESSION: 1. Redemonstrated lesion of the posterior right lobe of the liver, hepatic segment VI, is now diminished in size, measuring 3.1 x 2.7 cm, previously 4.1 x 3.5 cm. There is no longer internal arterial phase contrast enhancement within the bulk of this lesion, however there is some residual contrast enhancement at the superior aspect measuring approximately 2.2 x 1.3 cm. Findings are consistent with interval ablation of this LI-RADS category 5 lesion with evidence of residual viable tumor. 2. There are multiple additional subcentimeter arterially hyperenhancing foci in the liver dome, including a 0.7 cm lesion of the posterior liver dome seen on prior examination. Several other lesions were not seen on prior examination, although there appearance may be exquisitely sensitive to exact phase of contrast administration. These may represent small dysplastic or regenerative nodules and are LI-RADS category 3, intermediate probability for hepatocellular carcinoma. Attention on follow-up. 3. Cirrhosis and mild  splenomegaly. 4. No obvious abnormality of the bone marrow on this non tailored examination of the abdomen; please see forthcoming dedicated, separately reported examinations of the lumbar and thoracic spine. Electronically Signed   By: Delanna Ahmadi M.D.   On: 05/01/2021 12:32     Assessment and plan- Patient is a 57 y.o. male who is here for follow-up of the following issues:  Hepatocellular carcinoma: Nonmetastatic and s/p embolization for segment 6 of the liver back in November 2022 at [facility]I have reviewed his most recent MRI abdomen images independently and discussed findings with the patient which shows that the index lesion is now smaller from 4.1 x 3.5 cm to 3.1 x 2.7 cm.  There is no longer internal arterial phase contrast enhancement.  However there was some residual postcontrast enhancement at the superior aspect measuring 2.2 x 1.3 cm which could represent viable residual tumor..  There were additional subcentimeter foci in the dome of the liver which were categorized as LI-RADS category 3.  I have communicated these findings with [facility] interventional radiology as well and they will be taking a look at his images to see any further intervention is warranted from their side.  I will hold off on future MRI until I hear back from [facility].  I will tentatively see him back in 4 months with CBC with differential CMP and AFP  2.  Infiltrative lesion noted on MRI spine:Bone marrow/bone marrow biopsy did not show any evidence of carcinoma.  Overall clinical picture not consistent with myeloma.  Bone marrow biopsy showed 7% plasma cells by manual count.  These findings will also be seen in red marrow reconversion.  Overall these findings have remained stable since October 2022.  I plan is to monitor this with a repeat MRI in about 6 months time.  Suspect patient's low back pain is secondary to spinal stenosis at the level of L4-L5 which is being managed by orthopedics  I will see him back in 4 months with labs    Visit Diagnosis 1. Hepatocellular carcinoma ([place])  2. Abnormal MRI, spine      Dr. Randa Evens, MD, MPH [facility] at [facility] [phone] 05/07/2021 1:28 PM

## 2021-05-08 ENCOUNTER — Other Ambulatory Visit: Payer: Self-pay

## 2021-05-08 ENCOUNTER — Other Ambulatory Visit: Payer: Self-pay | Admitting: Internal Medicine

## 2021-05-08 ENCOUNTER — Encounter: Payer: Self-pay | Admitting: Internal Medicine

## 2021-05-08 ENCOUNTER — Ambulatory Visit (INDEPENDENT_AMBULATORY_CARE_PROVIDER_SITE_OTHER): Payer: Medicaid Other | Admitting: Internal Medicine

## 2021-05-08 VITALS — BP 170/80 | HR 86 | Temp 98.0°F | Ht 72.99 in | Wt 215.4 lb

## 2021-05-08 DIAGNOSIS — I499 Cardiac arrhythmia, unspecified: Secondary | ICD-10-CM | POA: Diagnosis not present

## 2021-05-08 DIAGNOSIS — I10 Essential (primary) hypertension: Secondary | ICD-10-CM

## 2021-05-08 DIAGNOSIS — E119 Type 2 diabetes mellitus without complications: Secondary | ICD-10-CM | POA: Diagnosis not present

## 2021-05-08 DIAGNOSIS — E1140 Type 2 diabetes mellitus with diabetic neuropathy, unspecified: Secondary | ICD-10-CM | POA: Diagnosis not present

## 2021-05-08 DIAGNOSIS — M48062 Spinal stenosis, lumbar region with neurogenic claudication: Secondary | ICD-10-CM | POA: Diagnosis not present

## 2021-05-08 DIAGNOSIS — E7800 Pure hypercholesterolemia, unspecified: Secondary | ICD-10-CM | POA: Diagnosis not present

## 2021-05-08 LAB — AFP TUMOR MARKER: AFP, Serum, Tumor Marker: 3.8 ng/mL (ref 0.0–8.4)

## 2021-05-08 LAB — BAYER DCA HB A1C WAIVED: HB A1C (BAYER DCA - WAIVED): 6.2 % — ABNORMAL HIGH (ref 4.8–5.6)

## 2021-05-08 MED ORDER — HYDROCHLOROTHIAZIDE 12.5 MG PO TABS: 12.5000 mg | ORAL_TABLET | Freq: Every day | ORAL | 3 refills | Status: DC

## 2021-05-08 MED ORDER — AMOXICILLIN-POT CLAVULANATE 875-125 MG PO TABS: 1.0000 | ORAL_TABLET | Freq: Two times a day (BID) | ORAL | 0 refills | Status: DC

## 2021-05-08 NOTE — Progress Notes (Signed)
1.     BP (!) 170/80    Pulse 86    Temp 98 F (36.7 C) (Oral)    Ht 6' 0.99" (1.854 m)    Wt 215 lb 6.6 oz (97.7 kg)    SpO2 97%    BMI 28.43 kg/m    Subjective:    Patient ID: Johnny Kelley, male    DOB: 12/05/1965, 56 y.o.   MRN: [id number]  Chief Complaint  Patient presents with   Diabetes   Hyperlipidemia    HPI: Johnny Kelley is a 56 y.o. male  Diabetes He presents for his follow-up (no a1c FSBS high at 18) diabetic visit. He has type 2 diabetes mellitus. Hypoglycemia symptoms include headaches.  Hyperlipidemia  Hypertension This is a chronic (high today is only on losartan 50 mg.) problem. Associated symptoms include headaches. Pertinent negatives include no neck pain.  Heart Problem This is a chronic (irregular hr / skipped beats on exam today per pt has had this in school in the past not seen cards yet.) problem. The current episode started more than 1 year ago. The problem occurs intermittently. Associated symptoms include congestion and headaches. Pertinent negatives include no chills, coughing, diaphoresis, neck pain, sore throat, swollen glands or urinary symptoms.  Sinus Problem This is a new problem. The current episode started in the past 7 days. Associated symptoms include congestion, headaches and sinus pressure. Pertinent negatives include no chills, coughing, diaphoresis, ear pain, hoarse voice, neck pain, sneezing, sore throat or swollen glands.   Chief Complaint  Patient presents with   Diabetes   Hyperlipidemia    Relevant past medical, surgical, family and social history reviewed and updated as indicated. Interim medical history since our last visit reviewed. Allergies and medications reviewed and updated.  Review of Systems  Constitutional:  Negative for chills and diaphoresis.  HENT:  Positive for congestion and sinus pressure. Negative for ear pain, hoarse voice, sneezing and sore throat.   Respiratory:  Negative for cough.    Musculoskeletal:  Negative for neck pain.  Neurological:  Positive for headaches.   Per HPI unless specifically indicated above     Objective:    BP (!) 170/80    Pulse 86    Temp 98 F (36.7 C) (Oral)    Ht 6' 0.99" (1.854 m)    Wt 215 lb 6.6 oz (97.7 kg)    SpO2 97%    BMI 28.43 kg/m   Wt Readings from Last 3 Encounters:  05/08/21 215 lb 6.6 oz (97.7 kg)  05/07/21 217 lb (98.4 kg)  03/05/21 209 lb (94.8 kg)    Physical Exam  Results for orders placed or performed in visit on 05/07/21  AFP tumor marker  Result Value Ref Range   AFP, Serum, Tumor Marker 3.8 0.0 - 8.4 ng/mL  CBC with Differential  Result Value Ref Range   WBC 4.7 4.0 - 10.5 K/uL   RBC 4.79 4.22 - 5.81 MIL/uL   Hemoglobin 16.1 13.0 - 17.0 g/dL   HCT 47.4 39.0 - 52.0 %   MCV 99.0 80.0 - 100.0 fL   MCH 33.6 26.0 - 34.0 pg   MCHC 34.0 30.0 - 36.0 g/dL   RDW 14.4 11.5 - 15.5 %   Platelets 86 (L) 150 - 400 K/uL   nRBC 0.0 0.0 - 0.2 %   Neutrophils Relative % 52 %   Neutro Abs 2.5 1.7 - 7.7 K/uL   Lymphocytes Relative 33 %   Lymphs Abs  1.5 0.7 - 4.0 K/uL   Monocytes Relative 12 %   Monocytes Absolute 0.6 0.1 - 1.0 K/uL   Eosinophils Relative 2 %   Eosinophils Absolute 0.1 0.0 - 0.5 K/uL   Basophils Relative 1 %   Basophils Absolute 0.0 0.0 - 0.1 K/uL   Immature Granulocytes 0 %   Abs Immature Granulocytes 0.01 0.00 - 0.07 K/uL  Comprehensive metabolic panel  Result Value Ref Range   Sodium 133 (L) 135 - 145 mmol/L   Potassium 4.7 3.5 - 5.1 mmol/L   Chloride 101 98 - 111 mmol/L   CO2 21 (L) 22 - 32 mmol/L   Glucose, Bld 244 (H) 70 - 99 mg/dL   BUN 11 6 - 20 mg/dL   Creatinine, Ser 0.77 0.61 - 1.24 mg/dL   Calcium 9.0 8.9 - 10.3 mg/dL   Total Protein 7.8 6.5 - 8.1 g/dL   Albumin 3.5 3.5 - 5.0 g/dL   AST 79 (H) 15 - 41 U/L   ALT 47 (H) 0 - 44 U/L   Alkaline Phosphatase 102 38 - 126 U/L   Total Bilirubin 1.6 (H) 0.3 - 1.2 mg/dL   GFR, Estimated >60 >60 mL/min   Anion gap 11 5 - 15         Current Outpatient Medications:    clopidogrel (PLAVIX) 75 MG tablet, clopidogrel 75 mg tablet  TAKE 1 TABLET BY MOUTH EVERY DAY, Disp: , Rfl:    clotrimazole (LOTRIMIN) 1 % cream, clotrimazole 1 % topical cream, Disp: , Rfl:    famotidine (PEPCID) 20 MG tablet, TAKE 1 TABLET BY MOUTH EVERYDAY AT BEDTIME, Disp: 30 tablet, Rfl: 3   gabapentin (NEURONTIN) 100 MG capsule, TAKE 1 CAPSULE BY MOUTH EVERY EVENING., Disp: 30 capsule, Rfl: 3   glimepiride (AMARYL) 2 MG tablet, Take 2 mg by mouth every morning., Disp: , Rfl:    JARDIANCE 10 MG TABS tablet, TAKE 1 TABLET BY MOUTH EVERY DAY BEFORE BREAKFAST, Disp: 30 tablet, Rfl: 2   lidocaine (LIDODERM) 5 %, Place 1 patch onto the skin daily. Remove & Discard patch within 12 hours or as directed by MD, Disp: 30 patch, Rfl: 3   losartan (COZAAR) 50 MG tablet, Take 1 tablet (50 mg total) by mouth daily., Disp: 90 tablet, Rfl: 2   meloxicam (MOBIC) 7.5 MG tablet, Take 7.5 mg by mouth daily., Disp: , Rfl:    metFORMIN (GLUCOPHAGE) 1000 MG tablet, TAKE 1 TABLET (1,000 MG TOTAL) BY MOUTH 2 (TWO) TIMES DAILY WITH A MEAL., Disp: 60 tablet, Rfl: 2   nicotine (NICODERM CQ - DOSED IN MG/24 HOURS) 14 mg/24hr patch, Place 1 patch (14 mg total) onto the skin daily., Disp: 28 patch, Rfl: 1   tamsulosin (FLOMAX) 0.4 MG CAPS capsule, Take 1 capsule (0.4 mg total) by mouth daily., Disp: 30 capsule, Rfl: 11   terbinafine (LAMISIL) 250 MG tablet, Take one tab po QD., Disp: 30 tablet, Rfl: 0   tiZANidine (ZANAFLEX) 4 MG tablet, Take 4 mg by mouth every 6 (six) hours as needed., Disp: , Rfl:    aspirin 325 MG tablet, Take 1 tablet (325 mg total) by mouth daily. (Patient not taking: Reported on 03/05/2021), Disp: 120 tablet, Rfl: 0   blood glucose meter kit and supplies KIT, Dispense based on patient and insurance preference. Use up to four times daily as directed. (FOR ICD-9 250.00, 250.01). (Patient not taking: Reported on 03/05/2021), Disp: 1 each, Rfl: 0    Assessment &  Plan:  DM  stable 1 at 6.2 fsbs high at 244 check HbA1c,  urine  microalbumin  diabetic diet plan given to pt  adviced regarding hypoglycemia and instructions given to pt today on how to prevent and treat the same if it were to occur. pt acknowledges the plan and voices understanding of the same.  exercise plan given and encouraged.   advice diabetic yearly podiatry, ophthalmology , nutritionist , dental check q 6 months,  2 HTN Bp high today on arrival before departure was wnl add hctz needs to keep bp logs to fu with me x 2 weeks for bp check  Medication compliance emphasised. pt advised to keep Bp logs. Pt verbalised understanding of the same. Pt to have a low salt diet . Exercise to reach a goal of at least 150 mins a week.  lifestyle modifications explained and pt understands importance of the above. Under good control on current regimen. Continue current regimen. Continue to monitor. Call with any concerns. Refills given. Labs drawn today.   3. HLD Stable on current med.s  recheck FLP, check LFT's work on diet, SE of meds explained to pt. low fat and high fiber diet explained to pt.  4. Back pain chronic to see orhto for a ? Procedure today  5. HCC stable, chornic, pt asymptomatic sees Dr. Janese Banks , s/ p embolization with much improved results. Fu and mx per hemeonc.    Problem List Items Addressed This Visit   None    No orders of the defined types were placed in this encounter.    No orders of the defined types were placed in this encounter.    Follow up plan: No follow-ups on file.

## 2021-05-08 NOTE — Telephone Encounter (Signed)
Requested medication (s) are due for refill today: yes  Requested medication (s) are on the active medication list: yes  Last refill:  01/06/21 #30 with 3 RF  Future visit scheduled: today, 05/08/21  Notes to clinic:  Visit today, please assess.  Requested Prescriptions  Pending Prescriptions Disp Refills   famotidine (PEPCID) 20 MG tablet [Pharmacy Med Name: FAMOTIDINE 20 MG TABLET] 30 tablet 3    Sig: TAKE 1 TABLET BY MOUTH EVERYDAY AT BEDTIME     Gastroenterology:  H2 Antagonists Passed - 05/08/2021  1:35 AM      Passed - Valid encounter within last 12 months    Recent Outpatient Visits           3 months ago Type 2 diabetes mellitus with diabetic neuropathy, without long-term current use of insulin ([place])   [facility] Vigg, Avanti, MD   4 months ago Diabetes mellitus without complication ([place])   [facility] Vigg, Avanti, MD   7 months ago Hypercholesterolemia   [facility] Vigg, Avanti, MD   8 months ago Hepatocellular carcinoma ([place])   [facility] Vigg, Avanti, MD   8 months ago Screen for colon cancer   [facility] Vigg, Avanti, MD       Future Appointments             Today Vigg, Avanti, MD [facility], [place]   In 5 months Ralene Bathe, MD [place]   In 6 months Hollice Espy, [place]

## 2021-05-09 DIAGNOSIS — N481 Balanitis: Secondary | ICD-10-CM | POA: Diagnosis not present

## 2021-05-09 DIAGNOSIS — R399 Unspecified symptoms and signs involving the genitourinary system: Secondary | ICD-10-CM | POA: Diagnosis not present

## 2021-05-09 DIAGNOSIS — A630 Anogenital (venereal) warts: Secondary | ICD-10-CM | POA: Diagnosis not present

## 2021-05-10 ENCOUNTER — Other Ambulatory Visit: Payer: Self-pay | Admitting: *Deleted

## 2021-05-10 DIAGNOSIS — C220 Liver cell carcinoma: Secondary | ICD-10-CM

## 2021-05-11 ENCOUNTER — Other Ambulatory Visit: Payer: Self-pay | Admitting: Internal Medicine

## 2021-05-11 DIAGNOSIS — I10 Essential (primary) hypertension: Secondary | ICD-10-CM

## 2021-05-11 NOTE — Telephone Encounter (Signed)
Requested Prescriptions  Pending Prescriptions Disp Refills   losartan (COZAAR) 50 MG tablet [Pharmacy Med Name: LOSARTAN POTASSIUM 50 MG TAB] 90 tablet 2    Sig: TAKE 1 TABLET BY MOUTH EVERY DAY     Cardiovascular:  Angiotensin Receptor Blockers Failed - 05/11/2021 12:51 AM      Failed - Last BP in normal range    BP Readings from Last 1 Encounters:  05/08/21 (!) 170/80         Passed - Cr in normal range and within 180 days    Creatinine, Ser  Date Value Ref Range Status  05/07/2021 0.77 0.61 - 1.24 mg/dL Final         Passed - K in normal range and within 180 days    Potassium  Date Value Ref Range Status  05/07/2021 4.7 3.5 - 5.1 mmol/L Final         Passed - Patient is not pregnant      Passed - Valid encounter within last 6 months    Recent Outpatient Visits          3 days ago Diabetes mellitus without complication ([place])   [facility] Vigg, Avanti, MD   3 months ago Type 2 diabetes mellitus with diabetic neuropathy, without long-term current use of insulin ([place])   [facility] Vigg, Avanti, MD   4 months ago Diabetes mellitus without complication ([place])   [facility] Vigg, Avanti, MD   7 months ago Hypercholesterolemia   [facility] Vigg, Avanti, MD   8 months ago Hepatocellular carcinoma ([place])   [facility] Vigg, Avanti, MD      Future Appointments            In 1 week Vigg, Avanti, MD [facility], [place]   In 3 weeks Ag[place]-Etang, Aaron Edelman, MD [facility]   In 5 months Ralene Bathe, MD [place]   In 6 months Hollice Espy, [place]

## 2021-05-22 ENCOUNTER — Other Ambulatory Visit: Payer: Medicaid Other

## 2021-05-23 ENCOUNTER — Ambulatory Visit (INDEPENDENT_AMBULATORY_CARE_PROVIDER_SITE_OTHER): Payer: Medicaid Other | Admitting: Internal Medicine

## 2021-05-23 ENCOUNTER — Encounter: Payer: Self-pay | Admitting: Internal Medicine

## 2021-05-23 ENCOUNTER — Other Ambulatory Visit: Payer: Self-pay

## 2021-05-23 DIAGNOSIS — I10 Essential (primary) hypertension: Secondary | ICD-10-CM | POA: Diagnosis not present

## 2021-05-23 DIAGNOSIS — E1159 Type 2 diabetes mellitus with other circulatory complications: Secondary | ICD-10-CM | POA: Insufficient documentation

## 2021-05-23 MED ORDER — LOSARTAN POTASSIUM 100 MG PO TABS: 100.0000 mg | ORAL_TABLET | Freq: Every day | ORAL | 4 refills | Status: DC

## 2021-05-23 NOTE — Progress Notes (Signed)
BP (!) 160/68    Pulse 88    Temp 98.5 F (36.9 C) (Oral)    Ht 6' 0.99" (1.854 m)    Wt 212 lb 3.2 oz (96.3 kg)    SpO2 96%    BMI 28.00 kg/m    Subjective:    Patient ID: Johnny Kelley, male    DOB: 30-Sep-1965, 56 y.o.   MRN: [id number]  Chief Complaint  Patient presents with   Hypertension    HPI: Johnny Kelley is a 56 y.o. male  Hypertension This is a chronic problem. The current episode started more than 1 year ago. The problem has been waxing and waning since onset. The problem is uncontrolled. Pertinent negatives include no anxiety, blurred vision, chest pain, headaches, malaise/fatigue, neck pain, orthopnea, palpitations, peripheral edema, PND, shortness of breath or sweats.   Chief Complaint  Patient presents with   Hypertension    Relevant past medical, surgical, family and social history reviewed and updated as indicated. Interim medical history since our last visit reviewed. Allergies and medications reviewed and updated.  Review of Systems  Constitutional:  Negative for malaise/fatigue.  Eyes:  Negative for blurred vision.  Respiratory:  Negative for shortness of breath.   Cardiovascular:  Negative for chest pain, palpitations, orthopnea and PND.  Musculoskeletal:  Negative for neck pain.  Neurological:  Negative for headaches.   Per HPI unless specifically indicated above     Objective:    BP (!) 160/68    Pulse 88    Temp 98.5 F (36.9 C) (Oral)    Ht 6' 0.99" (1.854 m)    Wt 212 lb 3.2 oz (96.3 kg)    SpO2 96%    BMI 28.00 kg/m   Wt Readings from Last 3 Encounters:  05/23/21 212 lb 3.2 oz (96.3 kg)  05/08/21 215 lb 6.6 oz (97.7 kg)  05/07/21 217 lb (98.4 kg)    Physical Exam Vitals and nursing note reviewed.  Constitutional:      General: He is not in acute distress.    Appearance: Normal appearance. He is not ill-appearing or diaphoretic.  HENT:     Head: Normocephalic.  Eyes:     Conjunctiva/sclera: Conjunctivae normal.      Pupils: Pupils are equal, round, and reactive to light.  Cardiovascular:     Rate and Rhythm: Normal rate and regular rhythm.     Heart sounds: No murmur heard.   No friction rub. No gallop.  Pulmonary:     Effort: No respiratory distress.     Breath sounds: No stridor. No wheezing or rhonchi.  Chest:     Chest wall: No tenderness.  Musculoskeletal:     Cervical back: Normal range of motion and neck supple. No rigidity or tenderness.  Skin:    General: Skin is warm and dry.  Neurological:     Mental Status: He is alert.  Psychiatric:        Mood and Affect: Mood normal.    Results for orders placed or performed in visit on 05/08/21  Bayer DCA Hb A1c Waived (STAT)  Result Value Ref Range   HB A1C (BAYER DCA - WAIVED) 6.2 (H) 4.8 - 5.6 %        Current Outpatient Medications:    aspirin 325 MG tablet, Take 1 tablet (325 mg total) by mouth daily., Disp: 120 tablet, Rfl: 0   clopidogrel (PLAVIX) 75 MG tablet, clopidogrel 75 mg tablet  TAKE 1 TABLET BY MOUTH EVERY  DAY, Disp: , Rfl:    clotrimazole (LOTRIMIN) 1 % cream, clotrimazole 1 % topical cream, Disp: , Rfl:    famotidine (PEPCID) 20 MG tablet, TAKE 1 TABLET BY MOUTH EVERYDAY AT BEDTIME, Disp: 30 tablet, Rfl: 3   gabapentin (NEURONTIN) 100 MG capsule, TAKE 1 CAPSULE BY MOUTH EVERY EVENING., Disp: 30 capsule, Rfl: 3   hydrochlorothiazide (HYDRODIURIL) 12.5 MG tablet, Take 1 tablet (12.5 mg total) by mouth daily., Disp: 90 tablet, Rfl: 3   JARDIANCE 10 MG TABS tablet, TAKE 1 TABLET BY MOUTH EVERY DAY BEFORE BREAKFAST, Disp: 30 tablet, Rfl: 2   lidocaine (LIDODERM) 5 %, Place 1 patch onto the skin daily. Remove & Discard patch within 12 hours or as directed by MD, Disp: 30 patch, Rfl: 3   meloxicam (MOBIC) 7.5 MG tablet, Take 7.5 mg by mouth daily., Disp: , Rfl:    metFORMIN (GLUCOPHAGE) 1000 MG tablet, TAKE 1 TABLET (1,000 MG TOTAL) BY MOUTH 2 (TWO) TIMES DAILY WITH A MEAL., Disp: 60 tablet, Rfl: 2   nicotine (NICODERM  CQ - DOSED IN MG/24 HOURS) 14 mg/24hr patch, Place 1 patch (14 mg total) onto the skin daily., Disp: 28 patch, Rfl: 1   tamsulosin (FLOMAX) 0.4 MG CAPS capsule, Take 1 capsule (0.4 mg total) by mouth daily., Disp: 30 capsule, Rfl: 11   terbinafine (LAMISIL) 250 MG tablet, Take one tab po QD., Disp: 30 tablet, Rfl: 0   tiZANidine (ZANAFLEX) 4 MG tablet, Take 4 mg by mouth every 6 (six) hours as needed., Disp: , Rfl:    blood glucose meter kit and supplies KIT, Dispense based on patient and insurance preference. Use up to four times daily as directed. (FOR ICD-9 250.00, 250.01). (Patient not taking: Reported on 03/05/2021), Disp: 1 each, Rfl: 0   losartan (COZAAR) 100 MG tablet, Take 1 tablet (100 mg total) by mouth daily., Disp: 30 tablet, Rfl: 4    Assessment & Plan:  HTN  Increase losartan 100 mg. Continue hctz for such  Continue current meds.  Medication compliance emphasised. pt advised to keep Bp logs. Pt verbalised understanding of the same. Pt to have a low salt diet . Exercise to reach a goal of at least 150 mins a week.  lifestyle modifications explained and pt understands importance of the above. Under good control on current regimen. Continue current regimen. Continue to monitor. Call with any concerns. Refills given. Labs drawn today.  Problem List Items Addressed This Visit   None Visit Diagnoses     Essential hypertension       Relevant Medications   losartan (COZAAR) 100 MG tablet        No orders of the defined types were placed in this encounter.    Meds ordered this encounter  Medications   losartan (COZAAR) 100 MG tablet    Sig: Take 1 tablet (100 mg total) by mouth daily.    Dispense:  30 tablet    Refill:  4     Follow up plan: Return in about 4 weeks (around 06/20/2021).

## 2021-05-23 NOTE — Patient Instructions (Signed)

## 2021-05-24 ENCOUNTER — Ambulatory Visit: Payer: Medicaid Other

## 2021-05-31 DIAGNOSIS — R399 Unspecified symptoms and signs involving the genitourinary system: Secondary | ICD-10-CM | POA: Diagnosis not present

## 2021-06-06 ENCOUNTER — Encounter: Payer: Self-pay | Admitting: Cardiology

## 2021-06-06 ENCOUNTER — Ambulatory Visit (INDEPENDENT_AMBULATORY_CARE_PROVIDER_SITE_OTHER): Payer: Medicaid Other

## 2021-06-06 ENCOUNTER — Other Ambulatory Visit: Payer: Self-pay

## 2021-06-06 ENCOUNTER — Ambulatory Visit (INDEPENDENT_AMBULATORY_CARE_PROVIDER_SITE_OTHER): Payer: Medicaid Other | Admitting: Cardiology

## 2021-06-06 VITALS — BP 144/74 | HR 78 | Ht 74.0 in | Wt 208.0 lb

## 2021-06-06 DIAGNOSIS — I10 Essential (primary) hypertension: Secondary | ICD-10-CM

## 2021-06-06 DIAGNOSIS — I499 Cardiac arrhythmia, unspecified: Secondary | ICD-10-CM | POA: Diagnosis not present

## 2021-06-06 DIAGNOSIS — I2510 Atherosclerotic heart disease of native coronary artery without angina pectoris: Secondary | ICD-10-CM

## 2021-06-06 MED ORDER — HYDROCHLOROTHIAZIDE 25 MG PO TABS: 25.0000 mg | ORAL_TABLET | Freq: Every day | ORAL | 3 refills | Status: DC

## 2021-06-06 NOTE — Patient Instructions (Addendum)
Medication Instructions:  ?Your physician has recommended you make the following change in your medication:  ? ?START Hydrochlorothiazide (HCTZ) 25 mg once daily ?  ?*If you need a refill on your cardiac medications before your next appointment, please call your pharmacy* ? ? ?Lab Work: ?None ? ?If you have labs (blood work) drawn today and your tests are completely normal, you will receive your results only by: ?MyChart Message (if you have MyChart) OR ?A paper copy in the mail ?If you have any lab test that is abnormal or we need to change your treatment, we will call you to review the results. ? ? ?Testing/Procedures: ?Your physician has requested that you have an echocardiogram.  ? ?Echocardiography is a painless test that uses sound waves to create images of your heart. It provides your doctor with information about the size and shape of your heart and how well your heart?s chambers and valves are working. This procedure takes approximately one hour. There are no restrictions for this procedure. ? ?Your provider has ordered a heart monitor to wear for 14 days.  ? ?This will be mailed to your home with instructions on placement. Once you have finished the time frame requested, you will return monitor in box provided. ? ? ? ? ? ?Follow-Up: ?At [facility], you and your health needs are our priority.  As part of our continuing mission to provide you with exceptional heart care, we have created designated Provider Care Teams.  These Care Teams include your primary Cardiologist (physician) and Advanced Practice Providers (APPs -  Physician Assistants and Nurse Practitioners) who all work together to provide you with the care you need, when you need it. ? ?We recommend signing up for the patient portal called "MyChart".  Sign up information is provided on this After Visit Summary.  MyChart is used to connect with patients for Virtual Visits (Telemedicine).  Patients are able to view lab/test results, encounter  notes, upcoming appointments, etc.  Non-urgent messages can be sent to your provider as well.   ?To learn more about what you can do with MyChart, go to [email or web address].   ? ?Your next appointment:   ?6 week(s) ? ?The format for your next appointment:   ?In Person ? ?Provider:   ?Kate Sable, MD  ? ?

## 2021-06-06 NOTE — Progress Notes (Signed)
Cardiology Office Note:    Date:  06/06/2021   ID:  Johnny Kelley, DOB 1966/03/31, MRN [id number]  PCP:  Charlynne Cousins, MD   [facility] Cardiologist:  Kate Sable, MD     Referring MD: Charlynne Cousins, MD   Chief Complaint  Patient presents with   New Patient (Initial Visit)    Referred by PCP for irregular heartbeat. Meds reviewed verbally with patient.     History of Present Illness:    Johnny Kelley is a 56 y.o. male with a hx of CAD, hypertension, diabetes, CVA 2020, current smoker x40+ years, hepatocellular carcinoma who presents due to irregular heartbeat and hypertension.  Patient saw her primary care provider last month, irregular heartbeat noted at clinic visit.  He denies palpitations, chest pain or shortness of breath.  He has known metastatic hepatocellular carcinoma, managed by oncology with serial imaging.  He still smokes, is working on quitting.  Platelets have been low, he bruises easily.  Had a stroke in 2020, started on aspirin and Plavix.   Echocardiogram 08/2018 50 to 55%.,  Inferior wall motion abnormalities Chest CT 01/2020 three-vessel coronary artery calcification  Past Medical History:  Diagnosis Date   Cancer, hepatocellular ([place])    Diabetes mellitus without complication ([place])    Hepatitis C    Hypertension    Psoriasis    Stroke ([place])    Hx stroke in 2020   Substance abuse [facility])     Past Surgical History:  Procedure Laterality Date   IR RADIOLOGIST EVAL & MGMT  01/19/2020   IR RADIOLOGIST EVAL & MGMT  01/31/2020   IR RADIOLOGIST EVAL & MGMT  04/12/2020   IR RADIOLOGIST EVAL & MGMT  08/30/2020   KNEE SURGERY Right     Current Medications: Current Meds  Medication Sig   aspirin 325 MG tablet Take 1 tablet (325 mg total) by mouth daily.   blood glucose meter kit and supplies KIT Dispense based on patient and insurance preference. Use up to four times daily as directed. (FOR ICD-9 250.00, 250.01).   clopidogrel  (PLAVIX) 75 MG tablet clopidogrel 75 mg tablet  TAKE 1 TABLET BY MOUTH EVERY DAY   clotrimazole (LOTRIMIN) 1 % cream clotrimazole 1 % topical cream   famotidine (PEPCID) 20 MG tablet TAKE 1 TABLET BY MOUTH EVERYDAY AT BEDTIME   gabapentin (NEURONTIN) 100 MG capsule TAKE 1 CAPSULE BY MOUTH EVERY EVENING.   hydrochlorothiazide (HYDRODIURIL) 25 MG tablet Take 1 tablet (25 mg total) by mouth daily.   JARDIANCE 10 MG TABS tablet TAKE 1 TABLET BY MOUTH EVERY DAY BEFORE BREAKFAST   lidocaine (LIDODERM) 5 % Place 1 patch onto the skin daily. Remove & Discard patch within 12 hours or as directed by MD   losartan (COZAAR) 100 MG tablet Take 1 tablet (100 mg total) by mouth daily.   meloxicam (MOBIC) 7.5 MG tablet Take 7.5 mg by mouth daily.   metFORMIN (GLUCOPHAGE) 1000 MG tablet TAKE 1 TABLET (1,000 MG TOTAL) BY MOUTH 2 (TWO) TIMES DAILY WITH A MEAL.   tamsulosin (FLOMAX) 0.4 MG CAPS capsule Take 1 capsule (0.4 mg total) by mouth daily.   terbinafine (LAMISIL) 250 MG tablet Take one tab po QD.   tiZANidine (ZANAFLEX) 4 MG tablet Take 4 mg by mouth every 6 (six) hours as needed.   [DISCONTINUED] hydrochlorothiazide (HYDRODIURIL) 12.5 MG tablet Take 1 tablet (12.5 mg total) by mouth daily.     Allergies:   Patient has no known allergies.  Social History   Socioeconomic History   Marital status: Unknown    Spouse name: Not on file   Number of children: Not on file   Years of education: Not on file   Highest education level: Not on file  Occupational History   Occupation: unemployed  Tobacco Use   Smoking status: Every Day    Packs/day: 0.10    Years: 38.00    Pack years: 3.80    Types: Cigarettes   Smokeless tobacco: Never  Vaping Use   Vaping Use: Never used  Substance and Sexual Activity   Alcohol use: Yes    Alcohol/week: 21.0 standard drinks    Types: 21 Cans of beer per week   Drug use: Yes    Types: Cocaine, Marijuana    Comment: reports marijuana use once per month and cocaine  use 1-2 monthper month   Sexual activity: Not Currently  Other Topics Concern   Not on file  Social History Narrative   Not on file   Social Determinants of Health   Financial Resource Strain: Not on file  Food Insecurity: Not on file  Transportation Needs: Not on file  Physical Activity: Not on file  Stress: Not on file  Social Connections: Not on file     Family History: The patient's family history includes COPD in his paternal grandfather; Colon cancer in his father; Diabetes in his father and mother; Hyperlipidemia in his paternal grandmother; Kidney disease in his maternal grandmother; Lung cancer in his maternal grandfather.  ROS:   Please see the history of present illness.     All other systems reviewed and are negative.  EKGs/Labs/Other Studies Reviewed:    The following studies were reviewed today:   EKG:  EKG is  ordered today.  The ekg ordered today demonstrates normal sinus rhythm  Recent Labs: 12/19/2020: TSH 1.920 05/07/2021: ALT 47; BUN 11; Creatinine, Ser 0.77; Hemoglobin 16.1; Platelets 86; Potassium 4.7; Sodium 133  Recent Lipid Panel    Component Value Date/Time   CHOL 168 05/01/2021 0840   TRIG 85 05/01/2021 0840   HDL 82 05/01/2021 0840   CHOLHDL 2.0 05/01/2021 0840   CHOLHDL 2.1 09/25/2018 0406   VLDL 13 09/25/2018 0406   LDLCALC 70 05/01/2021 0840     Risk Assessment/Calculations:          Physical Exam:    VS:  BP (!) 144/74 (BP Location: Left Arm, Patient Position: Sitting, Cuff Size: Normal)    Pulse 78    Ht '6\' 2"'  (1.88 m)    Wt 208 lb (94.3 kg)    BMI 26.71 kg/m     Wt Readings from Last 3 Encounters:  06/06/21 208 lb (94.3 kg)  05/23/21 212 lb 3.2 oz (96.3 kg)  05/08/21 215 lb 6.6 oz (97.7 kg)     GEN:  Well nourished, well developed in no acute distress HEENT: Normal NECK: No JVD; No carotid bruits LYMPHATICS: No lymphadenopathy CARDIAC: RRR, no murmurs, rubs, gallops RESPIRATORY:  Clear to auscultation without rales,  wheezing or rhonchi  ABDOMEN: Soft, non-tender, non-distended MUSCULOSKELETAL:  No edema; No deformity  SKIN: Warm and dry NEUROLOGIC:  Alert and oriented x 3 PSYCHIATRIC:  Normal affect   ASSESSMENT:    1. Coronary artery disease involving native coronary artery of native heart without angina pectoris   2. Primary hypertension   3. Irregular heart beat    PLAN:    In order of problems listed above:  CAD, three-vessel coronary artery calcification  on chest CT 2021.  Continue aspirin, Plavix.  Avoiding statin due to liver cancer.  LDL reasonable at 70 off statin. Hypertension, BP elevated but improving.  Increase HCTZ to 25 mg daily, continue losartan 100 mg daily. Irregular heartbeat, place cardiac monitor x2 weeks to evaluate any significant arrhythmias especially A-fib or flutter since Patient has a history of CVA.  Follow-up after echo and cardiac monitor.       Medication Adjustments/Labs and Tests Ordered: Current medicines are reviewed at length with the patient today.  Concerns regarding medicines are outlined above.  Orders Placed This Encounter  Procedures   LONG TERM MONITOR (3-14 DAYS)   EKG 12-Lead   ECHOCARDIOGRAM COMPLETE   Meds ordered this encounter  Medications   hydrochlorothiazide (HYDRODIURIL) 25 MG tablet    Sig: Take 1 tablet (25 mg total) by mouth daily.    Dispense:  90 tablet    Refill:  3    Patient Instructions  Medication Instructions:  Your physician has recommended you make the following change in your medication:   START Hydrochlorothiazide (HCTZ) 25 mg once daily   *If you need a refill on your cardiac medications before your next appointment, please call your pharmacy*   Lab Work: None  If you have labs (blood work) drawn today and your tests are completely normal, you will receive your results only by: [place] (if you have MyChart) OR A paper copy in the mail If you have any lab test that is abnormal or we need to  change your treatment, we will call you to review the results.   Testing/Procedures: Your physician has requested that you have an echocardiogram.   Echocardiography is a painless test that uses sound waves to create images of your heart. It provides your doctor with information about the size and shape of your heart and how well your hearts chambers and valves are working. This procedure takes approximately one hour. There are no restrictions for this procedure.  Your provider has ordered a heart monitor to wear for 14 days.   This will be mailed to your home with instructions on placement. Once you have finished the time frame requested, you will return monitor in box provided.      Follow-Up: At [facility], you and your health needs are our priority.  As part of our continuing mission to provide you with exceptional heart care, we have created designated Provider Care Teams.  These Care Teams include your primary Cardiologist (physician) and Advanced Practice Providers (APPs -  Physician Assistants and Nurse Practitioners) who all work together to provide you with the care you need, when you need it.  We recommend signing up for the patient portal called "MyChart".  Sign up information is provided on this After Visit Summary.  MyChart is used to connect with patients for Virtual Visits (Telemedicine).  Patients are able to view lab/test results, encounter notes, upcoming appointments, etc.  Non-urgent messages can be sent to your provider as well.   To learn more about what you can do with MyChart, go to [email or web address].    Your next appointment:   6 week(s)  The format for your next appointment:   In Person  Provider:   Kate Sable, MD     Signed, Kate Sable, MD  06/06/2021 12:12 PM    [place]

## 2021-06-07 DIAGNOSIS — M48062 Spinal stenosis, lumbar region with neurogenic claudication: Secondary | ICD-10-CM | POA: Diagnosis not present

## 2021-06-09 DIAGNOSIS — I10 Essential (primary) hypertension: Secondary | ICD-10-CM

## 2021-06-09 DIAGNOSIS — I499 Cardiac arrhythmia, unspecified: Secondary | ICD-10-CM | POA: Diagnosis not present

## 2021-06-09 DIAGNOSIS — I2510 Atherosclerotic heart disease of native coronary artery without angina pectoris: Secondary | ICD-10-CM

## 2021-06-10 ENCOUNTER — Other Ambulatory Visit: Payer: Self-pay | Admitting: Internal Medicine

## 2021-06-10 NOTE — Telephone Encounter (Signed)
Requested Prescriptions  ?Pending Prescriptions Disp Refills  ?? gabapentin (NEURONTIN) 100 MG capsule [Pharmacy Med Name: GABAPENTIN 100 MG CAPSULE] 30 capsule 3  ?  Sig: TAKE 1 CAPSULE BY MOUTH EVERY DAY IN THE EVENING  ?  ? Neurology: Anticonvulsants - gabapentin Passed - 06/10/2021  9:47 AM  ?  ?  Passed - Cr in normal range and within 360 days  ?  Creatinine, Ser  ?Date Value Ref Range Status  ?05/07/2021 0.77 0.61 - 1.24 mg/dL Final  ?   ?  ?  Passed - Completed PHQ-2 or PHQ-9 in the last 360 days  ?  ?  Passed - Valid encounter within last 12 months  ?  Recent Outpatient Visits   ?      ? 2 weeks ago Essential hypertension  ? [facility] Vigg, Avanti, MD  ? 1 month ago Diabetes mellitus without complication ([place])  ? [facility] Vigg, Avanti, MD  ? 4 months ago Type 2 diabetes mellitus with diabetic neuropathy, without long-term current use of insulin ([place])  ? [facility] Vigg, Avanti, MD  ? 5 months ago Diabetes mellitus without complication ([place])  ? [facility] Vigg, Avanti, MD  ? 8 months ago Hypercholesterolemia  ? [facility] Vigg, Avanti, MD  ?  ?  ?Future Appointments   ?        ? In 1 week Vigg, Avanti, MD [facility]  ? In 1 month Agbor-Etang, Aaron Edelman, MD [facility]  ? In 4 months Ralene Bathe, MD [place]  ? In 5 months Hollice Espy, MD [place]  ?  ? ?  ?  ?  ? ?

## 2021-06-20 ENCOUNTER — Other Ambulatory Visit: Payer: Self-pay

## 2021-06-20 ENCOUNTER — Ambulatory Visit (INDEPENDENT_AMBULATORY_CARE_PROVIDER_SITE_OTHER): Payer: Medicaid Other | Admitting: Internal Medicine

## 2021-06-20 ENCOUNTER — Encounter: Payer: Self-pay | Admitting: Internal Medicine

## 2021-06-20 VITALS — BP 130/60 | HR 80 | Temp 98.7°F | Ht 74.02 in | Wt 205.0 lb

## 2021-06-20 DIAGNOSIS — E7800 Pure hypercholesterolemia, unspecified: Secondary | ICD-10-CM | POA: Diagnosis not present

## 2021-06-20 DIAGNOSIS — H539 Unspecified visual disturbance: Secondary | ICD-10-CM

## 2021-06-20 DIAGNOSIS — I499 Cardiac arrhythmia, unspecified: Secondary | ICD-10-CM

## 2021-06-20 DIAGNOSIS — E1169 Type 2 diabetes mellitus with other specified complication: Secondary | ICD-10-CM | POA: Diagnosis not present

## 2021-06-20 DIAGNOSIS — E1140 Type 2 diabetes mellitus with diabetic neuropathy, unspecified: Secondary | ICD-10-CM | POA: Insufficient documentation

## 2021-06-20 DIAGNOSIS — I10 Essential (primary) hypertension: Secondary | ICD-10-CM | POA: Diagnosis not present

## 2021-06-20 MED ORDER — METFORMIN HCL 1000 MG PO TABS: 1000.0000 mg | ORAL_TABLET | Freq: Every day | ORAL | 2 refills | Status: DC

## 2021-06-20 MED ORDER — EMPAGLIFLOZIN 25 MG PO TABS: 25.0000 mg | ORAL_TABLET | Freq: Every day | ORAL | 5 refills | Status: DC

## 2021-06-20 NOTE — Progress Notes (Signed)
? ?BP 130/60 Comment: Manual  Pulse 80   Temp 98.7 ?F (37.1 ?C) (Oral)   Ht 6' 2.02" (1.88 m)   Wt 205 lb (93 kg)   SpO2 98%   BMI 26.31 kg/m?   ? ?Subjective:  ? ? Patient ID: Johnny Kelley, male    DOB: Feb 19, 1966, 56 y.o.   MRN: [id number] ? ?Chief Complaint  ?Patient presents with  ? Hypertension  ? ? ?HPI: ?PREM COYKENDALL is a 56 y.o. male ? ?Hypertension ?This is a chronic problem. The current episode started more than 1 month ago. The problem is controlled. Pertinent negatives include no anxiety, blurred vision, chest pain, headaches, malaise/fatigue, neck pain, orthopnea or palpitations.  ?Back Pain ?This is a chronic problem. The current episode started more than 1 month ago. Pertinent negatives include no abdominal pain, bladder incontinence, bowel incontinence, chest pain, dysuria, fever, headaches, numbness, paresis, paresthesias, pelvic pain, perianal numbness, tingling or weakness.  ? ?Chief Complaint  ?Patient presents with  ? Hypertension  ? ? ?Relevant past medical, surgical, family and social history reviewed and updated as indicated. Interim medical history since our last visit reviewed. ?Allergies and medications reviewed and updated. ? ?Review of Systems  ?Constitutional:  Negative for fever and malaise/fatigue.  ?Eyes:  Negative for blurred vision.  ?Cardiovascular:  Negative for chest pain, palpitations and orthopnea.  ?Gastrointestinal:  Negative for abdominal pain and bowel incontinence.  ?Genitourinary:  Negative for bladder incontinence, dysuria and pelvic pain.  ?Musculoskeletal:  Positive for back pain. Negative for neck pain.  ?Neurological:  Negative for tingling, weakness, numbness, headaches and paresthesias.  ? ?Per HPI unless specifically indicated above ? ?   ?Objective:  ?  ?BP 130/60 Comment: Manual  Pulse 80   Temp 98.7 ?F (37.1 ?C) (Oral)   Ht 6' 2.02" (1.88 m)   Wt 205 lb (93 kg)   SpO2 98%   BMI 26.31 kg/m?   ?Wt Readings from Last 3 Encounters:   ?06/20/21 205 lb (93 kg)  ?06/06/21 208 lb (94.3 kg)  ?05/23/21 212 lb 3.2 oz (96.3 kg)  ?  ?Physical Exam ? ?Results for orders placed or performed in visit on 05/08/21  ?Bayer DCA Hb A1c Waived (STAT)  ?Result Value Ref Range  ? HB A1C (BAYER DCA - WAIVED) 6.2 (H) 4.8 - 5.6 %  ? ?   ? ? ?Current Outpatient Medications:  ?  aspirin 325 MG tablet, Take 1 tablet (325 mg total) by mouth daily., Disp: 120 tablet, Rfl: 0 ?  blood glucose meter kit and supplies KIT, Dispense based on patient and insurance preference. Use up to four times daily as directed. (FOR ICD-9 250.00, 250.01)., Disp: 1 each, Rfl: 0 ?  clopidogrel (PLAVIX) 75 MG tablet, clopidogrel 75 mg tablet  TAKE 1 TABLET BY MOUTH EVERY DAY, Disp: , Rfl:  ?  clotrimazole (LOTRIMIN) 1 % cream, clotrimazole 1 % topical cream, Disp: , Rfl:  ?  famotidine (PEPCID) 20 MG tablet, TAKE 1 TABLET BY MOUTH EVERYDAY AT BEDTIME, Disp: 30 tablet, Rfl: 3 ?  gabapentin (NEURONTIN) 100 MG capsule, TAKE 1 CAPSULE BY MOUTH EVERY DAY IN THE EVENING, Disp: 30 capsule, Rfl: 3 ?  hydrochlorothiazide (HYDRODIURIL) 25 MG tablet, Take 1 tablet (25 mg total) by mouth daily., Disp: 90 tablet, Rfl: 3 ?  lidocaine (LIDODERM) 5 %, Place 1 patch onto the skin daily. Remove & Discard patch within 12 hours or as directed by MD, Disp: 30 patch, Rfl: 3 ?  losartan (  COZAAR) 100 MG tablet, Take 1 tablet (100 mg total) by mouth daily., Disp: 30 tablet, Rfl: 4 ?  meloxicam (MOBIC) 7.5 MG tablet, Take 7.5 mg by mouth daily., Disp: , Rfl:  ?  nicotine (NICODERM CQ - DOSED IN MG/24 HOURS) 14 mg/24hr patch, Place 1 patch (14 mg total) onto the skin daily., Disp: 28 patch, Rfl: 1 ?  solifenacin (VESICARE) 10 MG tablet, solifenacin 10 mg tablet  TAKE 1 TABLET (10 MG TOTAL) BY MOUTH DAILY. MAY CAUSE CONSTIPATION/DRY MOUTH., Disp: , Rfl:  ?  tamsulosin (FLOMAX) 0.4 MG CAPS capsule, Take 1 capsule (0.4 mg total) by mouth daily., Disp: 30 capsule, Rfl: 11 ?  terbinafine (LAMISIL) 250 MG tablet, Take one tab  po QD., Disp: 30 tablet, Rfl: 0 ?  tiZANidine (ZANAFLEX) 4 MG tablet, Take 4 mg by mouth every 6 (six) hours as needed., Disp: , Rfl:  ?  empagliflozin (JARDIANCE) 25 MG TABS tablet, Take 1 tablet (25 mg total) by mouth daily., Disp: 90 tablet, Rfl: 5 ?  metFORMIN (GLUCOPHAGE) 1000 MG tablet, Take 1 tablet (1,000 mg total) by mouth daily., Disp: 30 tablet, Rfl: 2  ? ? ?Assessment & Plan:  ?irregular heartbeat : has a heart monitor  ?Sees Dr. Mylo Red - [place] Night to fu with him on Monday next week. ? ? ?HTN : better is on losartan for such  ?Continue current meds.  Medication compliance emphasised. pt advised to keep Bp logs. Pt verbalised understanding of the same. Pt to have a low salt diet . Exercise to reach a goal of at least 150 mins a week.  lifestyle modifications explained and pt understands importance of the above. ?Under good control on current regimen. Continue current regimen. Continue to monitor. Call with any concerns. Refills given. Labs drawn today. ? ?hepatocellular carcinoma 2021 diagnosed with -ve mets to lungs. ?is an approximately 4.2 x 3.5 cm heterogeneously enhancing mass within the posterior segment of the right lobe of the liver (7, series 15), slightly increased in size compared to the 12/30/2019 examination, previously, 3.9 x 3.4 cm (by my direct remeasurement). ? ?Visual changes / blurry/ photophobia ?Will refer to ophthalmology for such ? ?Back pain to see ortho  ?To see PT  ?Back pain :  ?probably secondary to muscle spasms relieved with ibuprofen as prescribed in the ER UA negative no UTI patient does not have any symptoms either. ?will need ? MRI of back if pain persists ?adviced streches for back ?Patient advised to take medication as directed here. Patient advised to rest initially and then slowly increase activity level. Monitor changes in symptoms such as numbness, tingling or weakness in legs, changes in bowel or bladder habits or worsening back pain. Proper ergonomics discussed.  Referral to physical therapy as needed. Patient will call if symptoms worsen or if pain persists greater than 8 weeks. ? ?Problem List Items Addressed This Visit   ? ?  ? Cardiovascular and Mediastinum  ? Hypertension  ?  ? Endocrine  ? Type 2 diabetes mellitus with diabetic neuropathy, without long-term current use of insulin ([place])  ? Relevant Medications  ? empagliflozin (JARDIANCE) 25 MG TABS tablet  ? metFORMIN (GLUCOPHAGE) 1000 MG tablet  ? Type 2 diabetes mellitus with other specified complication ([place])  ? Relevant Medications  ? empagliflozin (JARDIANCE) 25 MG TABS tablet  ? metFORMIN (GLUCOPHAGE) 1000 MG tablet  ? Other Relevant Orders  ? CBC with Differential/Platelet  ? Bayer DCA Hb A1c Waived  ? Lipid panel  ?  Basic metabolic panel  ? Vitamin B12  ? Urinalysis, Routine w reflex microscopic  ?  ? Other  ? Hypercholesterolemia  ? Irregular heart rate  ? Relevant Orders  ? Ambulatory referral to Ophthalmology  ? Vision changes - Primary  ? Relevant Orders  ? Ambulatory referral to Ophthalmology  ? CBC with Differential/Platelet  ? Bayer DCA Hb A1c Waived  ? Lipid panel  ? Basic metabolic panel  ? Vitamin B12  ? Urinalysis, Routine w reflex microscopic  ?  ? ?Orders Placed This Encounter  ?Procedures  ? CBC with Differential/Platelet  ? Bayer DCA Hb A1c Waived  ? Lipid panel  ? Basic metabolic panel  ? Vitamin B12  ? Urinalysis, Routine w reflex microscopic  ? Ambulatory referral to Ophthalmology  ?  ? ?Meds ordered this encounter  ?Medications  ? empagliflozin (JARDIANCE) 25 MG TABS tablet  ?  Sig: Take 1 tablet (25 mg total) by mouth daily.  ?  Dispense:  90 tablet  ?  Refill:  5  ? metFORMIN (GLUCOPHAGE) 1000 MG tablet  ?  Sig: Take 1 tablet (1,000 mg total) by mouth daily.  ?  Dispense:  30 tablet  ?  Refill:  2  ?  ? ?Follow up plan: ?Return in about 3 months (around 09/20/2021). ? ?

## 2021-06-24 ENCOUNTER — Ambulatory Visit (INDEPENDENT_AMBULATORY_CARE_PROVIDER_SITE_OTHER): Payer: Medicaid Other

## 2021-06-24 ENCOUNTER — Other Ambulatory Visit: Payer: Self-pay

## 2021-06-24 DIAGNOSIS — I2510 Atherosclerotic heart disease of native coronary artery without angina pectoris: Secondary | ICD-10-CM | POA: Diagnosis not present

## 2021-06-24 LAB — ECHOCARDIOGRAM COMPLETE
AR max vel: 2.45 cm2
AV Area VTI: 2.39 cm2
AV Area mean vel: 2.27 cm2
AV Mean grad: 6 mmHg
AV Peak grad: 11.4 mmHg
Ao pk vel: 1.69 m/s
Area-P 1/2: 2.39 cm2
Calc EF: 59.9 %
S' Lateral: 2.7 cm
Single Plane A2C EF: 63.7 %
Single Plane A4C EF: 58.1 %

## 2021-06-27 DIAGNOSIS — M2560 Stiffness of unspecified joint, not elsewhere classified: Secondary | ICD-10-CM | POA: Diagnosis not present

## 2021-06-27 DIAGNOSIS — M545 Low back pain, unspecified: Secondary | ICD-10-CM | POA: Diagnosis not present

## 2021-07-02 ENCOUNTER — Telehealth: Payer: Self-pay

## 2021-07-02 DIAGNOSIS — M2560 Stiffness of unspecified joint, not elsewhere classified: Secondary | ICD-10-CM | POA: Diagnosis not present

## 2021-07-02 DIAGNOSIS — M545 Low back pain, unspecified: Secondary | ICD-10-CM | POA: Diagnosis not present

## 2021-07-02 MED ORDER — METOPROLOL SUCCINATE ER 25 MG PO TB24: 25.0000 mg | ORAL_TABLET | Freq: Every day | ORAL | 5 refills | Status: DC

## 2021-07-02 NOTE — Telephone Encounter (Signed)
Called patient and discussed Zio result note. Patient verbalized understanding and agreed with plan. ?

## 2021-07-02 NOTE — Telephone Encounter (Signed)
-----   Message from Kate Sable, MD sent at 07/02/2021 10:22 AM EDT ----- ?Occasional paroxysmal SVT, nonsustained VT lasting 4 beats noted.  Start Toprol-XL 25 mg daily. ?

## 2021-07-08 ENCOUNTER — Other Ambulatory Visit: Payer: Self-pay | Admitting: Dermatology

## 2021-07-08 DIAGNOSIS — B353 Tinea pedis: Secondary | ICD-10-CM

## 2021-07-11 ENCOUNTER — Other Ambulatory Visit: Payer: Self-pay | Admitting: Dermatology

## 2021-07-11 DIAGNOSIS — B353 Tinea pedis: Secondary | ICD-10-CM

## 2021-07-16 ENCOUNTER — Other Ambulatory Visit: Payer: Self-pay | Admitting: Oncology

## 2021-07-16 ENCOUNTER — Other Ambulatory Visit: Payer: Self-pay | Admitting: Dermatology

## 2021-07-16 DIAGNOSIS — B353 Tinea pedis: Secondary | ICD-10-CM

## 2021-07-16 DIAGNOSIS — C220 Liver cell carcinoma: Secondary | ICD-10-CM

## 2021-07-17 DIAGNOSIS — E119 Type 2 diabetes mellitus without complications: Secondary | ICD-10-CM | POA: Diagnosis not present

## 2021-07-17 LAB — HM DIABETES EYE EXAM

## 2021-07-18 ENCOUNTER — Encounter: Payer: Self-pay | Admitting: Internal Medicine

## 2021-07-18 ENCOUNTER — Ambulatory Visit (INDEPENDENT_AMBULATORY_CARE_PROVIDER_SITE_OTHER): Payer: Medicaid Other | Admitting: Cardiology

## 2021-07-18 ENCOUNTER — Encounter: Payer: Self-pay | Admitting: Cardiology

## 2021-07-18 VITALS — BP 110/64 | HR 70 | Ht 74.0 in | Wt 201.0 lb

## 2021-07-18 DIAGNOSIS — I2510 Atherosclerotic heart disease of native coronary artery without angina pectoris: Secondary | ICD-10-CM

## 2021-07-18 DIAGNOSIS — I471 Supraventricular tachycardia: Secondary | ICD-10-CM | POA: Diagnosis not present

## 2021-07-18 DIAGNOSIS — I10 Essential (primary) hypertension: Secondary | ICD-10-CM | POA: Diagnosis not present

## 2021-07-18 NOTE — Patient Instructions (Signed)
Medication Instructions:  ? ?Your physician recommends that you continue on your current medications as directed. Please refer to the Current Medication list given to you today. ? ?*If you need a refill on your cardiac medications before your next appointment, please call your pharmacy* ? ? ?Lab Work: ?None ordered ?If you have labs (blood work) drawn today and your tests are completely normal, you will receive your results only by: ?MyChart Message (if you have MyChart) OR ?A paper copy in the mail ?If you have any lab test that is abnormal or we need to change your treatment, we will call you to review the results. ? ? ?Testing/Procedures: ?None ordered ? ? ?Follow-Up: ?At [facility], you and your health needs are our priority.  As part of our continuing mission to provide you with exceptional heart care, we have created designated Provider Care Teams.  These Care Teams include your primary Cardiologist (physician) and Advanced Practice Providers (APPs -  Physician Assistants and Nurse Practitioners) who all work together to provide you with the care you need, when you need it. ? ?We recommend signing up for the patient portal called "MyChart".  Sign up information is provided on this After Visit Summary.  MyChart is used to connect with patients for Virtual Visits (Telemedicine).  Patients are able to view lab/test results, encounter notes, upcoming appointments, etc.  Non-urgent messages can be sent to your provider as well.   ?To learn more about what you can do with MyChart, go to [email or web address].   ? ?Your next appointment:   ?6 month(s) ? ?The format for your next appointment:   ?In Person ? ?Provider:   ?You may see Kate Sable, MD or one of the following Advanced Practice Providers on your designated Care Team:   ?Murray Hodgkins, NP ?Christell Faith, PA-C ?Cadence Kathlen Mody, PA-C  ? ? ?Other Instructions ? ? ?Important Information About Sugar ? ? ? ? ? ? ?

## 2021-07-18 NOTE — Progress Notes (Signed)
?Cardiology Office Note:   ? ?Date:  07/18/2021  ? ?ID:  Johnny Kelley, DOB 1965/12/08, MRN [id number] ? ?PCP:  Charlynne Cousins, MD ?  ?[facility] Providers ?Cardiologist:  Kate Sable, MD    ? ?Referring MD: Charlynne Cousins, MD  ? ?Chief Complaint  ?Patient presents with  ? OTher  ?  6 week follow up -- Meds reviewed verbally with patient.   ? ? ?History of Present Illness:   ? ?Johnny Kelley is a 56 y.o. male with a hx of CAD, hypertension, diabetes, CVA 2020, current smoker x40+ years, hepatocellular carcinoma who presents for follow-up.  Previously seen due to irregular heartbeat and hypertension. ? ?Cardiac monitor was placed to evaluate heartbeat irregularity.  Previously, BP was elevated, HCTZ was increased to 25 mg daily.  Heartbeat irregularity has improved since starting Toprol-XL.  Blood pressure is also much better.  He still smokes, is working on quitting.  He bruises easily, stopped taking aspirin.  Takes Plavix only. ? ? ?Prior notes/studies ?Echo 05/2021 EF 60 to 65% ?Echocardiogram 08/2018 50 to 55%.,  Inferior wall motion abnormalities ?Chest CT 01/2020 three-vessel coronary artery calcification ? ?Past Medical History:  ?Diagnosis Date  ? Cancer, hepatocellular ([place])   ? Diabetes mellitus without complication ([place])   ? Hepatitis C   ? Hypertension   ? Psoriasis   ? Stroke [facility])   ? Hx stroke in 2020  ? Substance abuse ([place])   ? ? ?Past Surgical History:  ?Procedure Laterality Date  ? IR RADIOLOGIST EVAL & MGMT  01/19/2020  ? IR RADIOLOGIST EVAL & MGMT  01/31/2020  ? IR RADIOLOGIST EVAL & MGMT  04/12/2020  ? IR RADIOLOGIST EVAL & MGMT  08/30/2020  ? KNEE SURGERY Right   ? ? ?Current Medications: ?Current Meds  ?Medication Sig  ? blood glucose meter kit and supplies KIT Dispense based on patient and insurance preference. Use up to four times daily as directed. (FOR ICD-9 250.00, 250.01).  ? clopidogrel (PLAVIX) 75 MG tablet clopidogrel 75 mg tablet ? TAKE 1 TABLET BY MOUTH EVERY DAY  ?  clotrimazole (LOTRIMIN) 1 % cream clotrimazole 1 % topical cream  ? empagliflozin (JARDIANCE) 25 MG TABS tablet Take 1 tablet (25 mg total) by mouth daily.  ? famotidine (PEPCID) 20 MG tablet TAKE 1 TABLET BY MOUTH EVERYDAY AT BEDTIME  ? gabapentin (NEURONTIN) 100 MG capsule TAKE 1 CAPSULE BY MOUTH EVERY DAY IN THE EVENING  ? hydrochlorothiazide (HYDRODIURIL) 25 MG tablet Take 1 tablet (25 mg total) by mouth daily.  ? lidocaine (LIDODERM) 5 % Place 1 patch onto the skin daily. Remove & Discard patch within 12 hours or as directed by MD  ? losartan (COZAAR) 100 MG tablet Take 1 tablet (100 mg total) by mouth daily.  ? meloxicam (MOBIC) 7.5 MG tablet Take 7.5 mg by mouth daily.  ? metFORMIN (GLUCOPHAGE) 1000 MG tablet Take 1 tablet (1,000 mg total) by mouth daily.  ? metoprolol succinate (TOPROL XL) 25 MG 24 hr tablet Take 1 tablet (25 mg total) by mouth daily.  ? nicotine (NICODERM CQ - DOSED IN MG/24 HOURS) 14 mg/24hr patch Place 1 patch (14 mg total) onto the skin daily.  ? solifenacin (VESICARE) 10 MG tablet solifenacin 10 mg tablet ? TAKE 1 TABLET (10 MG TOTAL) BY MOUTH DAILY. MAY CAUSE CONSTIPATION/DRY MOUTH.  ? tamsulosin (FLOMAX) 0.4 MG CAPS capsule Take 1 capsule (0.4 mg total) by mouth daily.  ? terbinafine (LAMISIL) 250 MG tablet Take one  tab po QD.  ? tiZANidine (ZANAFLEX) 4 MG tablet Take 4 mg by mouth every 6 (six) hours as needed.  ? [DISCONTINUED] aspirin 325 MG tablet Take 1 tablet (325 mg total) by mouth daily.  ?  ? ?Allergies:   Patient has no known allergies.  ? ?Social History  ? ?Socioeconomic History  ? Marital status: Unknown  ?  Spouse name: Not on file  ? Number of children: Not on file  ? Years of education: Not on file  ? Highest education level: Not on file  ?Occupational History  ? Occupation: unemployed  ?Tobacco Use  ? Smoking status: Every Day  ?  Packs/day: 0.10  ?  Years: 38.00  ?  Pack years: 3.80  ?  Types: Cigarettes  ? Smokeless tobacco: Never  ?Vaping Use  ? Vaping Use: Never  used  ?Substance and Sexual Activity  ? Alcohol use: Yes  ?  Alcohol/week: 21.0 standard drinks  ?  Types: 21 Cans of beer per week  ? Drug use: Yes  ?  Types: Cocaine, Marijuana  ?  Comment: reports marijuana use once per month and cocaine use 1-2 monthper month  ? Sexual activity: Not Currently  ?Other Topics Concern  ? Not on file  ?Social History Narrative  ? Not on file  ? ?Social Determinants of Health  ? ?Financial Resource Strain: Not on file  ?Food Insecurity: Not on file  ?Transportation Needs: Not on file  ?Physical Activity: Not on file  ?Stress: Not on file  ?Social Connections: Not on file  ?  ? ?Family History: ?The patient's family history includes COPD in his paternal grandfather; Colon cancer in his father; Diabetes in his father and mother; Hyperlipidemia in his paternal grandmother; Kidney disease in his maternal grandmother; Lung cancer in his maternal grandfather. ? ?ROS:   ?Please see the history of present illness.    ? All other systems reviewed and are negative. ? ?EKGs/Labs/Other Studies Reviewed:   ? ?The following studies were reviewed today: ? ? ?EKG:  EKG is  ordered today.  The ekg ordered today demonstrates normal sinus rhythm ? ?Recent Labs: ?12/19/2020: TSH 1.920 ?05/07/2021: ALT 47; BUN 11; Creatinine, Ser 0.77; Hemoglobin 16.1; Platelets 86; Potassium 4.7; Sodium 133  ?Recent Lipid Panel ?   ?Component Value Date/Time  ? CHOL 168 05/01/2021 0840  ? TRIG 85 05/01/2021 0840  ? HDL 82 05/01/2021 0840  ? CHOLHDL 2.0 05/01/2021 0840  ? CHOLHDL 2.1 09/25/2018 0406  ? VLDL 13 09/25/2018 0406  ? [place] 70 05/01/2021 0840  ? ? ? ?Risk Assessment/Calculations:   ? ? ?    ? ?Physical Exam:   ? ?VS:  BP 110/64 (BP Location: Left Arm, Patient Position: Sitting, Cuff Size: Normal)   Pulse 70   Ht _0  (1.88 m)   Wt 201 lb (91.2 kg)   SpO2 97%   BMI 25.81 kg/m?    ? ?Wt Readings from Last 3 Encounters:  ?07/18/21 201 lb (91.2 kg)  ?06/20/21 205 lb (93 kg)  ?06/06/21 208 lb (94.3 kg)  ?   ? ?GEN:  Well nourished, well developed in no acute distress ?HEENT: Normal ?NECK: No JVD; No carotid bruits ?LYMPHATICS: No lymphadenopathy ?CARDIAC: RRR, no murmurs, rubs, gallops ?RESPIRATORY:  Clear to auscultation without rales, wheezing or rhonchi  ?ABDOMEN: Soft, non-tender, non-distended ?MUSCULOSKELETAL:  No edema; No deformity  ?SKIN: Warm and dry ?NEUROLOGIC:  Alert and oriented x 3 ?PSYCHIATRIC:  Normal affect  ? ?ASSESSMENT:   ? ?  1. Coronary artery disease involving native coronary artery of native heart without angina pectoris   ?2. Primary hypertension   ?3. Paroxysmal SVT (supraventricular tachycardia) (HCC)   ? ?PLAN:   ? ?In order of problems listed above: ? ?CAD, three-vessel coronary artery calcification on chest CT 2021.  Continue aspirin, Plavix.  Avoiding statin due to liver cancer.  LDL reasonable at 70 off statin.  Echo EF 60 to 65% ?Hypertension, BP now controlled.  Continue HCTZ  25 mg daily, losartan 100 mg daily. ?Irregular heartbeats, cardiac monitor revealed occasional paroxysmal SVT, 4 beats of nonsustained VT.  Toprol-XL 25 mg started.  Echo 05/2021 EF 60 to 65%. ? ?Follow-up in 6 months. ? ?   ? ? ?Medication Adjustments/Labs and Tests Ordered: ?Current medicines are reviewed at length with the patient today.  Concerns regarding medicines are outlined above.  ?No orders of the defined types were placed in this encounter. ? ?No orders of the defined types were placed in this encounter. ? ? ?Patient Instructions  ?Medication Instructions:  ? ?Your physician recommends that you continue on your current medications as directed. Please refer to the Current Medication list given to you today. ? ?*If you need a refill on your cardiac medications before your next appointment, please call your pharmacy* ? ? ?Lab Work: ?None ordered ?If you have labs (blood work) drawn today and your tests are completely normal, you will receive your results only by: ?MyChart Message (if you have MyChart)  OR ?A paper copy in the mail ?If you have any lab test that is abnormal or we need to change your treatment, we will call you to review the results. ? ? ?Testing/Procedures: ?None ordered ? ? ?Follow-Up: ?At [facility]

## 2021-07-24 DIAGNOSIS — M545 Low back pain, unspecified: Secondary | ICD-10-CM | POA: Diagnosis not present

## 2021-07-24 DIAGNOSIS — M2560 Stiffness of unspecified joint, not elsewhere classified: Secondary | ICD-10-CM | POA: Diagnosis not present

## 2021-07-25 ENCOUNTER — Encounter: Payer: Self-pay | Admitting: Internal Medicine

## 2021-08-07 ENCOUNTER — Ambulatory Visit
Admission: RE | Admit: 2021-08-07 | Discharge: 2021-08-07 | Disposition: A | Discharge status: Home / Self Care | Payer: Medicaid Other | Source: Ambulatory Visit | Attending: Licensed Clinical Social Worker | Admitting: Licensed Clinical Social Worker

## 2021-08-07 DIAGNOSIS — C220 Liver cell carcinoma: Secondary | ICD-10-CM | POA: Insufficient documentation

## 2021-08-07 DIAGNOSIS — K7460 Unspecified cirrhosis of liver: Secondary | ICD-10-CM | POA: Diagnosis not present

## 2021-08-07 DIAGNOSIS — K7689 Other specified diseases of liver: Secondary | ICD-10-CM | POA: Diagnosis not present

## 2021-08-07 DIAGNOSIS — I700 Atherosclerosis of aorta: Secondary | ICD-10-CM | POA: Diagnosis not present

## 2021-08-07 MED ORDER — GADOBUTROL 1 MMOL/ML IV SOLN
9.0000 mL | Freq: Once | INTRAVENOUS | Status: AC | PRN
  Administered 2021-08-07: 9 mL via INTRAVENOUS

## 2021-08-08 NOTE — Progress Notes (Signed)
Images sent through radiology and Dr. Tasia Catchings, IR, notified.

## 2021-08-15 DIAGNOSIS — M2560 Stiffness of unspecified joint, not elsewhere classified: Secondary | ICD-10-CM | POA: Diagnosis not present

## 2021-08-15 DIAGNOSIS — M545 Low back pain, unspecified: Secondary | ICD-10-CM | POA: Diagnosis not present

## 2021-08-21 DIAGNOSIS — M545 Low back pain, unspecified: Secondary | ICD-10-CM | POA: Diagnosis not present

## 2021-08-21 DIAGNOSIS — M2560 Stiffness of unspecified joint, not elsewhere classified: Secondary | ICD-10-CM | POA: Diagnosis not present

## 2021-08-23 DIAGNOSIS — M545 Low back pain, unspecified: Secondary | ICD-10-CM | POA: Diagnosis not present

## 2021-08-23 DIAGNOSIS — M2560 Stiffness of unspecified joint, not elsewhere classified: Secondary | ICD-10-CM | POA: Diagnosis not present

## 2021-08-26 ENCOUNTER — Other Ambulatory Visit: Payer: Self-pay | Admitting: Dermatology

## 2021-08-26 ENCOUNTER — Other Ambulatory Visit: Payer: Self-pay | Admitting: Internal Medicine

## 2021-08-26 DIAGNOSIS — B353 Tinea pedis: Secondary | ICD-10-CM

## 2021-08-27 DIAGNOSIS — M545 Low back pain, unspecified: Secondary | ICD-10-CM | POA: Diagnosis not present

## 2021-08-27 DIAGNOSIS — M2560 Stiffness of unspecified joint, not elsewhere classified: Secondary | ICD-10-CM | POA: Diagnosis not present

## 2021-08-28 NOTE — Telephone Encounter (Signed)
Requested Prescriptions  Pending Prescriptions Disp Refills  . famotidine (PEPCID) 20 MG tablet [Pharmacy Med Name: FAMOTIDINE 20 MG TABLET] 90 tablet 1    Sig: TAKE 1 TABLET BY MOUTH EVERYDAY AT BEDTIME     Gastroenterology:  H2 Antagonists Passed - 08/26/2021 11:12 AM      Passed - Valid encounter within last 12 months    Recent Outpatient Visits          2 months ago Vision changes   [place] Vigg, Avanti, MD   3 months ago Essential hypertension   [place] Vigg, Avanti, MD   3 months ago Diabetes mellitus without complication ([place])   [facility] Vigg, Avanti, MD   6 months ago Type 2 diabetes mellitus with diabetic neuropathy, without long-term current use of insulin ([place])   [place] Vigg, Avanti, MD   8 months ago Diabetes mellitus without complication ([place])   [place] Vigg, Avanti, MD      Future Appointments            In 3 weeks Vigg, Avanti, MD [facility], [place]   In 1 month Ralene Bathe, MD [place]   In 3 months Hollice Espy, MD [place]

## 2021-09-04 ENCOUNTER — Ambulatory Visit: Payer: Medicaid Other | Admitting: Medical Oncology

## 2021-09-04 ENCOUNTER — Other Ambulatory Visit: Payer: Medicaid Other

## 2021-09-04 DIAGNOSIS — M545 Low back pain, unspecified: Secondary | ICD-10-CM | POA: Diagnosis not present

## 2021-09-04 DIAGNOSIS — M2560 Stiffness of unspecified joint, not elsewhere classified: Secondary | ICD-10-CM | POA: Diagnosis not present

## 2021-09-05 ENCOUNTER — Ambulatory Visit: Payer: Medicaid Other | Admitting: Podiatry

## 2021-09-05 DIAGNOSIS — R300 Dysuria: Secondary | ICD-10-CM | POA: Diagnosis not present

## 2021-09-05 DIAGNOSIS — B351 Tinea unguium: Secondary | ICD-10-CM | POA: Diagnosis not present

## 2021-09-05 DIAGNOSIS — E1140 Type 2 diabetes mellitus with diabetic neuropathy, unspecified: Secondary | ICD-10-CM

## 2021-09-05 DIAGNOSIS — R399 Unspecified symptoms and signs involving the genitourinary system: Secondary | ICD-10-CM | POA: Diagnosis not present

## 2021-09-05 DIAGNOSIS — M79675 Pain in left toe(s): Secondary | ICD-10-CM | POA: Diagnosis not present

## 2021-09-05 DIAGNOSIS — M79674 Pain in right toe(s): Secondary | ICD-10-CM | POA: Diagnosis not present

## 2021-09-06 DIAGNOSIS — M2560 Stiffness of unspecified joint, not elsewhere classified: Secondary | ICD-10-CM | POA: Diagnosis not present

## 2021-09-06 DIAGNOSIS — M545 Low back pain, unspecified: Secondary | ICD-10-CM | POA: Diagnosis not present

## 2021-09-09 DIAGNOSIS — M545 Low back pain, unspecified: Secondary | ICD-10-CM | POA: Diagnosis not present

## 2021-09-09 DIAGNOSIS — M2560 Stiffness of unspecified joint, not elsewhere classified: Secondary | ICD-10-CM | POA: Diagnosis not present

## 2021-09-10 NOTE — Progress Notes (Signed)
  Subjective:  Patient ID: Johnny Kelley, male    DOB: 10-Nov-1965,  MRN: [id number]  Chief Complaint  Patient presents with   Callouses    Corn / callus - routine foot care   56 y.o. male returns for the above complaint.  Patient presents with thickened elongated dystrophic toenails x10.  Patient states that they are painful to cut.  He is not able to cut himself.  He would like for me to do it.  He denies any other acute complaints he is a diabetic with last A1c of 10%.  Objective:  There were no vitals filed for this visit. Podiatric Exam: Vascular: dorsalis pedis and posterior tibial pulses are palpable bilateral. Capillary return is immediate. Temperature gradient is WNL. Skin turgor WNL  Sensorium: Normal Semmes Weinstein monofilament test. Normal tactile sensation bilaterally. Nail Exam: Pt has thick disfigured discolored nails with subungual debris noted bilateral entire nail hallux through fifth toenails.  Pain on palpation to the nails. Ulcer Exam: There is no evidence of ulcer or pre-ulcerative changes or infection. Orthopedic Exam: Muscle tone and strength are WNL. No limitations in general ROM. No crepitus or effusions noted. HAV  B/L.  Hammer toes 2-5  B/L. Skin: No Porokeratosis. No infection or ulcers    Assessment & Plan:   1. Pain due to onychomycosis of toenails of both feet   2. Type 2 diabetes mellitus with diabetic neuropathy, without long-term current use of insulin ([place])      Patient was evaluated and treated and all questions answered.  Onychomycosis with pain  -Nails palliatively debrided as below. -Educated on self-care  Procedure: Nail Debridement Rationale: pain  Type of Debridement: manual, sharp debridement. Instrumentation: Nail nipper, rotary burr. Number of Nails: 10  Procedures and Treatment: Consent by patient was obtained for treatment procedures. The patient understood the discussion of treatment and procedures well. All questions were  answered thoroughly reviewed. Debridement of mycotic and hypertrophic toenails, 1 through 5 bilateral and clearing of subungual debris. No ulceration, no infection noted.  Return Visit-Office Procedure: Patient instructed to return to the office for a follow up visit 3 months for continued evaluation and treatment.  Boneta Lucks, DPM    No follow-ups on file.

## 2021-09-16 DIAGNOSIS — M545 Low back pain, unspecified: Secondary | ICD-10-CM | POA: Diagnosis not present

## 2021-09-16 DIAGNOSIS — M2560 Stiffness of unspecified joint, not elsewhere classified: Secondary | ICD-10-CM | POA: Diagnosis not present

## 2021-09-17 ENCOUNTER — Other Ambulatory Visit: Payer: Self-pay | Admitting: Internal Medicine

## 2021-09-17 DIAGNOSIS — E1140 Type 2 diabetes mellitus with diabetic neuropathy, unspecified: Secondary | ICD-10-CM

## 2021-09-17 NOTE — Telephone Encounter (Signed)
Future visit in 1 week.  Requested Prescriptions  Pending Prescriptions Disp Refills  . metFORMIN (GLUCOPHAGE) 1000 MG tablet [Pharmacy Med Name: METFORMIN HCL 1,000 MG TABLET] 30 tablet 0    Sig: TAKE 1 TABLET BY MOUTH EVERY DAY     Endocrinology:  Diabetes - Biguanides Passed - 09/17/2021  1:51 AM      Passed - Cr in normal range and within 360 days    Creatinine, Ser  Date Value Ref Range Status  05/07/2021 0.77 0.61 - 1.24 mg/dL Final         Passed - HBA1C is between 0 and 7.9 and within 180 days    HB A1C (BAYER DCA - WAIVED)  Date Value Ref Range Status  05/08/2021 6.2 (H) 4.8 - 5.6 % Final    Comment:             Prediabetes: 5.7 - 6.4          Diabetes: >6.4          Glycemic control for adults with diabetes: <7.0          Passed - eGFR in normal range and within 360 days    GFR calc Af Amer  Date Value Ref Range Status  10/10/2019 >60 >60 mL/min Final   GFR, Estimated  Date Value Ref Range Status  05/07/2021 >60 >60 mL/min Final    Comment:    (NOTE) Calculated using the CKD-EPI Creatinine Equation (2021)    eGFR  Date Value Ref Range Status  05/01/2021 101 >59 mL/min/1.73 Final         Passed - B12 Level in normal range and within 720 days    Vitamin B-12  Date Value Ref Range Status  10/10/2019 699 180 - 914 pg/mL Final    Comment:    (NOTE) This assay is not validated for testing neonatal or myeloproliferative syndrome specimens for Vitamin B12 levels. Performed at [facility], [place] [address], [place], [place] [address]          Passed - Valid encounter within last 6 months    Recent Outpatient Visits          2 months ago Vision changes   [place] Vigg, Avanti, MD   3 months ago Essential hypertension   [place] Vigg, Avanti, MD   4 months ago Diabetes mellitus without complication ([place])   [facility] Vigg, Avanti, MD   7 months ago Type 2 diabetes mellitus with diabetic neuropathy,  without long-term current use of insulin ([place])   [place] Vigg, Avanti, MD   9 months ago Diabetes mellitus without complication ([place])   [place] Vigg, Avanti, MD      Future Appointments            In 1 week Vigg, Avanti, MD [facility], [place]   In 1 month Ralene Bathe, MD [place]   In 2 months Hollice Espy, MD [place] within normal limits and completed in the last 12 months    WBC  Date Value Ref Range Status  05/07/2021 4.7 4.0 - 10.5 K/uL Final   RBC  Date Value Ref Range Status  05/07/2021 4.79 4.22 - 5.81 MIL/uL Final   Hemoglobin  Date Value Ref Range Status  05/07/2021 16.1 13.0 - 17.0 g/dL Final  05/01/2021 15.7 13.0 - 17.7 g/dL Final  HCT  Date Value Ref Range Status  05/07/2021 47.4 39.0 - 52.0 % Final   Hematocrit  Date Value Ref Range Status  05/01/2021 45.0 37.5 - 51.0 % Final   MCHC  Date Value Ref Range Status  05/07/2021 34.0 30.0 - 36.0 g/dL Final   [facility]  Date Value Ref Range Status  05/07/2021 33.6 26.0 - 34.0 pg Final   MCV  Date Value Ref Range Status  05/07/2021 99.0 80.0 - 100.0 fL Final  05/01/2021 96 79 - 97 fL Final   No results found for: "PLTCOUNTKUC", "LABPLAT", "POCPLA" RDW  Date Value Ref Range Status  05/07/2021 14.4 11.5 - 15.5 % Final  05/01/2021 12.9 11.6 - 15.4 % Final

## 2021-09-18 DIAGNOSIS — M545 Low back pain, unspecified: Secondary | ICD-10-CM | POA: Diagnosis not present

## 2021-09-18 DIAGNOSIS — M2560 Stiffness of unspecified joint, not elsewhere classified: Secondary | ICD-10-CM | POA: Diagnosis not present

## 2021-09-20 ENCOUNTER — Inpatient Hospital Stay: Payer: Medicaid Other | Attending: Oncology

## 2021-09-20 ENCOUNTER — Encounter: Payer: Self-pay | Admitting: Oncology

## 2021-09-20 ENCOUNTER — Ambulatory Visit: Payer: Medicaid Other | Admitting: Internal Medicine

## 2021-09-20 ENCOUNTER — Inpatient Hospital Stay (HOSPITAL_BASED_OUTPATIENT_CLINIC_OR_DEPARTMENT_OTHER): Payer: Medicaid Other | Admitting: Oncology

## 2021-09-20 VITALS — BP 147/87 | HR 67 | Temp 98.2°F | Resp 16 | Ht 74.0 in | Wt 200.5 lb

## 2021-09-20 DIAGNOSIS — Z801 Family history of malignant neoplasm of trachea, bronchus and lung: Secondary | ICD-10-CM | POA: Insufficient documentation

## 2021-09-20 DIAGNOSIS — Z800 Family history of malignant neoplasm of digestive organs: Secondary | ICD-10-CM | POA: Insufficient documentation

## 2021-09-20 DIAGNOSIS — C220 Liver cell carcinoma: Secondary | ICD-10-CM | POA: Insufficient documentation

## 2021-09-20 DIAGNOSIS — F17210 Nicotine dependence, cigarettes, uncomplicated: Secondary | ICD-10-CM | POA: Diagnosis not present

## 2021-09-20 LAB — CBC WITH DIFFERENTIAL/PLATELET
Abs Immature Granulocytes: 0.01 10*3/uL (ref 0.00–0.07)
Basophils Absolute: 0.1 10*3/uL (ref 0.0–0.1)
Basophils Relative: 1 %
Eosinophils Absolute: 0.1 10*3/uL (ref 0.0–0.5)
Eosinophils Relative: 2 %
HCT: 47.2 % (ref 39.0–52.0)
Hemoglobin: 16.2 g/dL (ref 13.0–17.0)
Immature Granulocytes: 0 %
Lymphocytes Relative: 24 %
Lymphs Abs: 1.2 10*3/uL (ref 0.7–4.0)
MCH: 35.9 pg — ABNORMAL HIGH (ref 26.0–34.0)
MCHC: 34.3 g/dL (ref 30.0–36.0)
MCV: 104.7 fL — ABNORMAL HIGH (ref 80.0–100.0)
Monocytes Absolute: 0.7 10*3/uL (ref 0.1–1.0)
Monocytes Relative: 13 %
Neutro Abs: 3 10*3/uL (ref 1.7–7.7)
Neutrophils Relative %: 60 %
Platelets: 83 10*3/uL — ABNORMAL LOW (ref 150–400)
RBC: 4.51 MIL/uL (ref 4.22–5.81)
RDW: 13.2 % (ref 11.5–15.5)
WBC: 5 10*3/uL (ref 4.0–10.5)
nRBC: 0 % (ref 0.0–0.2)

## 2021-09-20 LAB — COMPREHENSIVE METABOLIC PANEL
ALT: 49 U/L — ABNORMAL HIGH (ref 0–44)
AST: 61 U/L — ABNORMAL HIGH (ref 15–41)
Albumin: 3.2 g/dL — ABNORMAL LOW (ref 3.5–5.0)
Alkaline Phosphatase: 162 U/L — ABNORMAL HIGH (ref 38–126)
Anion gap: 8 (ref 5–15)
BUN: 11 mg/dL (ref 6–20)
CO2: 27 mmol/L (ref 22–32)
Calcium: 9.5 mg/dL (ref 8.9–10.3)
Chloride: 97 mmol/L — ABNORMAL LOW (ref 98–111)
Creatinine, Ser: 1.06 mg/dL (ref 0.61–1.24)
GFR, Estimated: >60 mL/min (ref >60)
Glucose, Bld: 363 mg/dL — ABNORMAL HIGH (ref 70–99)
Potassium: 4.4 mmol/L (ref 3.5–5.1)
Sodium: 132 mmol/L — ABNORMAL LOW (ref 135–145)
Total Bilirubin: 2.3 mg/dL — ABNORMAL HIGH (ref 0.3–1.2)
Total Protein: 7.3 g/dL (ref 6.5–8.1)

## 2021-09-21 LAB — AFP TUMOR MARKER: AFP, Serum, Tumor Marker: 3.5 ng/mL (ref 0.0–8.4)

## 2021-09-23 DIAGNOSIS — M2560 Stiffness of unspecified joint, not elsewhere classified: Secondary | ICD-10-CM | POA: Diagnosis not present

## 2021-09-23 DIAGNOSIS — M545 Low back pain, unspecified: Secondary | ICD-10-CM | POA: Diagnosis not present

## 2021-09-24 ENCOUNTER — Encounter: Payer: Self-pay | Admitting: Unknown Physician Specialty

## 2021-09-24 ENCOUNTER — Ambulatory Visit (INDEPENDENT_AMBULATORY_CARE_PROVIDER_SITE_OTHER): Payer: Medicaid Other | Admitting: Unknown Physician Specialty

## 2021-09-24 DIAGNOSIS — I499 Cardiac arrhythmia, unspecified: Secondary | ICD-10-CM

## 2021-09-24 DIAGNOSIS — C220 Liver cell carcinoma: Secondary | ICD-10-CM

## 2021-09-24 DIAGNOSIS — I10 Essential (primary) hypertension: Secondary | ICD-10-CM | POA: Diagnosis not present

## 2021-09-24 DIAGNOSIS — E785 Hyperlipidemia, unspecified: Secondary | ICD-10-CM

## 2021-09-24 DIAGNOSIS — E1140 Type 2 diabetes mellitus with diabetic neuropathy, unspecified: Secondary | ICD-10-CM | POA: Diagnosis not present

## 2021-09-24 MED ORDER — GABAPENTIN 100 MG PO CAPS: 100.0000 mg | ORAL_CAPSULE | Freq: Every day | ORAL | 3 refills | Status: DC

## 2021-09-24 NOTE — Assessment & Plan Note (Addendum)
Not on statins due to liver cancer.  Last LDL was 70

## 2021-09-25 DIAGNOSIS — M2560 Stiffness of unspecified joint, not elsewhere classified: Secondary | ICD-10-CM | POA: Diagnosis not present

## 2021-09-25 DIAGNOSIS — M545 Low back pain, unspecified: Secondary | ICD-10-CM | POA: Diagnosis not present

## 2021-10-03 DIAGNOSIS — R399 Unspecified symptoms and signs involving the genitourinary system: Secondary | ICD-10-CM | POA: Diagnosis not present

## 2021-10-04 DIAGNOSIS — M545 Low back pain, unspecified: Secondary | ICD-10-CM | POA: Diagnosis not present

## 2021-10-04 DIAGNOSIS — M2560 Stiffness of unspecified joint, not elsewhere classified: Secondary | ICD-10-CM | POA: Diagnosis not present

## 2021-10-07 DIAGNOSIS — M545 Low back pain, unspecified: Secondary | ICD-10-CM | POA: Diagnosis not present

## 2021-10-07 DIAGNOSIS — M2560 Stiffness of unspecified joint, not elsewhere classified: Secondary | ICD-10-CM | POA: Diagnosis not present

## 2021-10-15 DIAGNOSIS — M545 Low back pain, unspecified: Secondary | ICD-10-CM | POA: Diagnosis not present

## 2021-10-15 DIAGNOSIS — M2560 Stiffness of unspecified joint, not elsewhere classified: Secondary | ICD-10-CM | POA: Diagnosis not present

## 2021-10-17 ENCOUNTER — Other Ambulatory Visit: Payer: Self-pay

## 2021-10-17 ENCOUNTER — Ambulatory Visit: Payer: Medicaid Other | Admitting: Dermatology

## 2021-10-17 DIAGNOSIS — E1140 Type 2 diabetes mellitus with diabetic neuropathy, unspecified: Secondary | ICD-10-CM

## 2021-10-17 DIAGNOSIS — M545 Low back pain, unspecified: Secondary | ICD-10-CM | POA: Diagnosis not present

## 2021-10-17 DIAGNOSIS — L820 Inflamed seborrheic keratosis: Secondary | ICD-10-CM

## 2021-10-17 DIAGNOSIS — B351 Tinea unguium: Secondary | ICD-10-CM | POA: Diagnosis not present

## 2021-10-17 DIAGNOSIS — Z79899 Other long term (current) drug therapy: Secondary | ICD-10-CM | POA: Diagnosis not present

## 2021-10-17 DIAGNOSIS — L918 Other hypertrophic disorders of the skin: Secondary | ICD-10-CM

## 2021-10-17 DIAGNOSIS — L570 Actinic keratosis: Secondary | ICD-10-CM | POA: Diagnosis not present

## 2021-10-17 DIAGNOSIS — L720 Epidermal cyst: Secondary | ICD-10-CM | POA: Diagnosis not present

## 2021-10-17 DIAGNOSIS — L578 Other skin changes due to chronic exposure to nonionizing radiation: Secondary | ICD-10-CM

## 2021-10-17 DIAGNOSIS — M2560 Stiffness of unspecified joint, not elsewhere classified: Secondary | ICD-10-CM | POA: Diagnosis not present

## 2021-10-17 MED ORDER — TERBINAFINE HCL 250 MG PO TABS: 250.0000 mg | ORAL_TABLET | Freq: Every day | ORAL | 0 refills | Status: DC

## 2021-10-17 MED ORDER — METFORMIN HCL 1000 MG PO TABS: 1000.0000 mg | ORAL_TABLET | Freq: Every day | ORAL | 1 refills | Status: DC

## 2021-10-17 MED ORDER — FLUOROURACIL 5 % EX CREA: TOPICAL_CREAM | Freq: Two times a day (BID) | CUTANEOUS | 1 refills | Status: DC

## 2021-10-17 NOTE — Progress Notes (Signed)
Follow-Up Visit   Subjective  Johnny Kelley is a 56 y.o. male who presents for the following: Tinea pedis/unguium, Tinea manis (Bil feet, toenails, fingernails, 50mf/u, total of 485mf Lamisil); Actinic Keratosis (Face, 70m91mu, pt did not do recommended PDT); and skin tags (Bil axilla, irritating). The patient has spots, moles and lesions to be evaluated, some may be new or changing and the patient has concerns that these could be cancer.  The following portions of the chart were reviewed this encounter and updated as appropriate:   Tobacco  Allergies  Meds  Problems  Med Hx  Surg Hx  Fam Hx     Review of Systems:  No other skin or systemic complaints except as noted in HPI or Assessment and Plan.  Objective  Well appearing patient in no apparent distress; mood and affect are within normal limits.  A focused examination was performed including face, bil axilla, hands, feet, trunk. Relevant physical exam findings are noted in the Assessment and Plan.  Right Foot - Anterior Toenails dystrophy, hands and fingernails clear  face x 16 (16) Pink scaly macules  R upper back Cystic pap 2.0cm  bil arms x 16 (16) Stuck on waxy paps with erythema   Assessment & Plan   Acrochordons (Skin Tags) - Fleshy, skin-colored pedunculated papules - Benign appearing.  - Observe. - If desired, they can be removed with an in office procedure that is not covered by insurance. - Please call the clinic if you notice any new or changing lesions.   Actinic Damage - Severe, confluent actinic changes with pre-cancerous actinic keratoses  - Severe, chronic, not at goal, secondary to cumulative UV radiation exposure over time - diffuse scaly erythematous macules and papules with underlying dyspigmentation - Discussed Prescription "Field Treatment" for Severe, Chronic Confluent Actinic Changes with Pre-Cancerous Actinic Keratoses Field treatment involves treatment of an entire area of skin that  has confluent Actinic Changes (Sun/ Ultraviolet light damage) and PreCancerous Actinic Keratoses by method of PhotoDynamic Therapy (PDT) and/or prescription Topical Chemotherapy agents such as 5-fluorouracil, 5-fluorouracil/calcipotriene, and/or imiquimod.  The purpose is to decrease the number of clinically evident and subclinical PreCancerous lesions to prevent progression to development of skin cancer by chemically destroying early precancer changes that may or may not be visible.  It has been shown to reduce the risk of developing skin cancer in the treated area. As a result of treatment, redness, scaling, crusting, and open sores may occur during treatment course. One or more than one of these methods may be used and may have to be used several times to control, suppress and eliminate the PreCancerous changes. Discussed treatment course, expected reaction, and possible side effects. - Recommend daily broad spectrum sunscreen SPF 30+ to sun-exposed areas, reapply every 2 hours as needed.  - Staying in the shade or wearing long sleeves, sun glasses (UVA+UVB protection) and wide brim hats (4-inch brim around the entire circumference of the hat) are also recommended. - Call for new or changing lesions.  Start 5-fluorouracil/calcipotriene cream twice a day for 7 days to affected areas including forehead, temples and nose. [facility] with contact information for [facility] and advised the pharmacy will mail the prescription to their home. Patient provided with handout reviewing treatment course and side effects and advised to call or message us [place] MyChart with any concerns.   Tinea unguium Right Foot - Anterior With hx of Tinea pedis and Tinea manis Chronic and persistent  condition with duration or expected duration over one year. Condition is symptomatic / bothersome to patient. Not to goal.  Long term medication management.  Patient is using long term (months  to years) prescription medication  to control their dermatologic condition.  These medications require periodic monitoring to evaluate for efficacy and side effects and may require periodic laboratory monitoring.  Recommend 1 more month of Lamisil for a total of 5 months Restart Lamisil '250mg'$  1 po qd #30 0rf  terbinafine (LAMISIL) 250 MG tablet - Right Foot - Anterior Take 1 tablet (250 mg total) by mouth daily.  AK (actinic keratosis) (16) face x 16 Destruction of lesion - face x 16 Complexity: simple   Destruction method: cryotherapy   Informed consent: discussed and consent obtained   Timeout:  patient name, date of birth, surgical site, and procedure verified Lesion destroyed using liquid nitrogen: Yes   Region frozen until ice ball extended beyond lesion: Yes   Outcome: patient tolerated procedure well with no complications   Post-procedure details: wound care instructions given    fluorouracil (EFUDEX) 5 % cream - face x 16 Apply topically 2 (two) times daily. On November 29, 2021 start cream bid for 7 days to forehead, temples and nose  Epidermal cyst R upper back Benign, Discussed excising.  Inflamed seborrheic keratosis (16) bil arms x 16 Symptomatic, irritating, patient would like treated. Destruction of lesion - bil arms x 16 Complexity: simple   Destruction method: cryotherapy   Informed consent: discussed and consent obtained   Timeout:  patient name, date of birth, surgical site, and procedure verified Lesion destroyed using liquid nitrogen: Yes   Region frozen until ice ball extended beyond lesion: Yes   Outcome: patient tolerated procedure well with no complications   Post-procedure details: wound care instructions given    Return in about 4 months (around 02/17/2022) for AK f/u.  I, Othelia Pulling, RMA, am acting as scribe for Sarina Ser, MD . Documentation: I have reviewed the above documentation for accuracy and completeness, and I agree with the  above.  Sarina Ser, MD

## 2021-10-17 NOTE — Telephone Encounter (Signed)
Patient last seen on 09/24/21  Has up coming appt 03/25/22 Last A1c was 05/28/21 was 6.2

## 2021-10-17 NOTE — Patient Instructions (Addendum)
Cryotherapy Aftercare  Wash gently with soap and water everyday.   Apply Vaseline and Band-Aid daily until healed.    On September 1 Start 5-fluorouracil/calcipotriene cream twice a day for 7 days to affected areas including forehead, temples and nose. Prescription sent to [facility] [phone]  5-Fluorouracil/Calcipotriene Patient Education   Actinic keratoses are the dry, red scaly spots on the skin caused by sun damage. A portion of these spots can turn into skin cancer with time, and treating them can help prevent development of skin cancer.   Treatment of these spots requires removal of the defective skin cells. There are various ways to remove actinic keratoses, including freezing with liquid nitrogen, treatment with creams, or treatment with a blue light procedure in the office.   5-fluorouracil cream is a topical cream used to treat actinic keratoses. It works by interfering with the growth of abnormal fast-growing skin cells, such as actinic keratoses. These cells peel off and are replaced by healthy ones.   5-fluorouracil/calcipotriene is a combination of the 5-fluorouracil cream with a vitamin D analog cream called calcipotriene. The calcipotriene alone does not treat actinic keratoses. However, when it is combined with 5-fluorouracil, it helps the 5-fluorouracil treat the actinic keratoses much faster so that the same results can be achieved with a much shorter treatment time.  INSTRUCTIONS FOR 5-FLUOROURACIL/CALCIPOTRIENE CREAM:   5-fluorouracil/calcipotriene cream typically only needs to be used for 4-7 days. A thin layer should be applied twice a day to the treatment areas recommended by your physician.   If your physician prescribed you separate tubes of 5-fluourouracil and calcipotriene, apply a thin layer of 5-fluorouracil followed by a thin layer of calcipotriene.   Avoid contact with your eyes, nostrils, and mouth. Do not use 5-fluorouracil/calcipotriene cream on infected  or open wounds.   You will develop redness, irritation and some crusting at areas where you have pre-cancer damage/actinic keratoses. IF YOU DEVELOP PAIN, BLEEDING, OR SIGNIFICANT CRUSTING, STOP THE TREATMENT EARLY - you have already gotten a good response and the actinic keratoses should clear up well.  Wash your hands after applying 5-fluorouracil 5% cream on your skin.   A moisturizer or sunscreen with a minimum SPF 30 should be applied each morning.   Once you have finished the treatment, you can apply a thin layer of Vaseline twice a day to irritated areas to soothe and calm the areas more quickly. If you experience significant discomfort, contact your physician.  For some patients it is necessary to repeat the treatment for best results.  SIDE EFFECTS: When using 5-fluorouracil/calcipotriene cream, you may have mild irritation, such as redness, dryness, swelling, or a mild burning sensation. This usually resolves within 2 weeks. The more actinic keratoses you have, the more redness and inflammation you can expect during treatment. Eye irritation has been reported rarely. If this occurs, please let us know.   If you have any trouble using this cream, please call the office. If you have any other questions about this information, please do not hesitate to ask me before you leave the office.     Due to recent changes in healthcare laws, you may see results of your pathology and/or laboratory studies on MyChart before the doctors have had a chance to review them. We understand that in some cases there may be results that are confusing or concerning to you. Please understand that not all results are received at the same time and often the doctors may need to interpret multiple results in order to  provide you with the best plan of care or course of treatment. Therefore, we ask that you please give [place] 2 business days to thoroughly review all your results before contacting the office for  clarification. Should we see a critical lab result, you will be contacted sooner.   If You Need Anything After Your Visit  If you have any questions or concerns for your doctor, please call our main line at [phone] and press option 4 to reach your doctor's medical assistant. If no one answers, please leave a voicemail as directed and we will return your call as soon as possible. Messages left after 4 pm will be answered the following business day.   You may also send [place] a message via [place]. We typically respond to MyChart messages within 1-2 business days.  For prescription refills, please ask your pharmacy to contact our office. Our fax number is [phone].  If you have an urgent issue when the clinic is closed that cannot wait until the next business day, you can page your doctor at the number below.    Please note that while we do our best to be available for urgent issues outside of office hours, we are not available 24/7.   If you have an urgent issue and are unable to reach [place], you may choose to seek medical care at your doctor's office, retail clinic, urgent care center, or emergency room.  If you have a medical emergency, please immediately call 911 or go to the emergency department.  Pager Numbers  - Dr. Nehemiah Massed: [phone]  - Dr. Laurence Ferrari: [phone]  - Dr. Nicole Kindred: [phone]  In the event of inclement weather, please call our main line at [phone] for an update on the status of any delays or closures.  Dermatology Medication Tips: Please keep the boxes that topical medications come in in order to help keep track of the instructions about where and how to use these. Pharmacies typically print the medication instructions only on the boxes and not directly on the medication tubes.   If your medication is too expensive, please contact our office at [phone] option 4 or send [place] a message through [place].   We are unable to tell what your co-pay for  medications will be in advance as this is different depending on your insurance coverage. However, we may be able to find a substitute medication at lower cost or fill out paperwork to get insurance to cover a needed medication.   If a prior authorization is required to get your medication covered by your insurance company, please allow [place] 1-2 business days to complete this process.  Drug prices often vary depending on where the prescription is filled and some pharmacies may offer cheaper prices.  The website www.goodrx.com contains coupons for medications through different pharmacies. The prices here do not account for what the cost may be with help from insurance (it may be cheaper with your insurance), but the website can give you the price if you did not use any insurance.  - You can print the associated coupon and take it with your prescription to the pharmacy.  - You may also stop by our office during regular business hours and pick up a GoodRx coupon card.  - If you need your prescription sent electronically to a different pharmacy, notify our office through [facility] or by phone at [phone] option 4.     Si Usted Necesita Algo Despus de Su Visita  Tambin puede enviarnos un mensaje  a travs de MyChart. Por lo general respondemos a los mensajes de MyChart en el transcurso de 1 a 2 das hbiles.  Para renovar recetas, por favor pida a su farmacia que se ponga en contacto con nuestra oficina. Harland Dingwall de fax es [place] [phone].  Si tiene un asunto urgente cuando la clnica est cerrada y que no puede esperar hasta el siguiente da hbil, puede llamar/localizar a su doctor(a) al nmero que aparece a continuacin.   Por favor, tenga en cuenta que aunque hacemos todo lo posible para estar disponibles para asuntos urgentes fuera del horario de [place], no estamos disponibles las 24 horas del da, los 7 das de la [place].   Si tiene un problema urgente y no puede  comunicarse con nosotros, puede optar por buscar atencin mdica  en el consultorio de su doctor(a), en una clnica privada, en un centro de atencin urgente o en una sala de emergencias.  Si tiene Engineering geologist, por favor llame inmediatamente al 911 o vaya a la sala de emergencias.  Nmeros de bper  - Dr. Nehemiah Massed: [phone]  - Dra. Moye: [phone]  - Dra. Nicole Kindred: [phone]  En caso de inclemencias del [place], por favor llame a Johnsie Kindred principal al [phone] para una actualizacin sobre el [place] de cualquier retraso o cierre.  Consejos para la medicacin en dermatologa: Por favor, guarde las cajas en las que vienen los medicamentos de uso tpico para ayudarle a seguir las instrucciones sobre dnde y cmo usarlos. Las farmacias generalmente imprimen las instrucciones del medicamento slo en las cajas y no directamente en los tubos del [place].   Si su medicamento es muy caro, por favor, pngase en contacto con Zigmund Daniel llamando al [phone] y presione la opcin 4 o envenos un mensaje a travs de Pharmacist, community.   No podemos decirle cul ser su copago por los medicamentos por adelantado ya que esto es diferente dependiendo de la cobertura de su seguro. Sin embargo, es posible que podamos encontrar un medicamento sustituto a Electrical engineer un formulario para que el seguro cubra el medicamento que se considera necesario.   Si se requiere una autorizacin previa para que su compaa de seguros [place] su medicamento, por favor permtanos de 1 a 2 das hbiles para completar este proceso.  Los precios de los medicamentos varan con frecuencia dependiendo del Environmental consultant de dnde se surte la receta y alguna farmacias pueden ofrecer precios ms baratos.  El sitio web www.goodrx.com tiene cupones para medicamentos de Airline pilot. Los precios aqu no tienen en cuenta lo que podra costar con la ayuda del seguro (puede ser ms barato con su seguro), pero  el sitio web puede darle el precio si no utiliz Research scientist (physical sciences).  - Puede imprimir el cupn correspondiente y llevarlo con su receta a la farmacia.  - Tambin puede pasar por nuestra oficina durante el horario de atencin regular y Charity fundraiser una tarjeta de cupones de GoodRx.  - Si necesita que su receta se enve electrnicamente a una farmacia diferente, informe a nuestra oficina a travs de MyChart de [place] o por telfono llamando al [phone] y presione la opcin 4.

## 2021-10-21 DIAGNOSIS — M545 Low back pain, unspecified: Secondary | ICD-10-CM | POA: Diagnosis not present

## 2021-10-21 DIAGNOSIS — M2560 Stiffness of unspecified joint, not elsewhere classified: Secondary | ICD-10-CM | POA: Diagnosis not present

## 2021-10-23 DIAGNOSIS — M2560 Stiffness of unspecified joint, not elsewhere classified: Secondary | ICD-10-CM | POA: Diagnosis not present

## 2021-10-23 DIAGNOSIS — M545 Low back pain, unspecified: Secondary | ICD-10-CM | POA: Diagnosis not present

## 2021-10-26 ENCOUNTER — Encounter: Payer: Self-pay | Admitting: Dermatology

## 2021-10-28 DIAGNOSIS — M545 Low back pain, unspecified: Secondary | ICD-10-CM | POA: Diagnosis not present

## 2021-10-28 DIAGNOSIS — M2560 Stiffness of unspecified joint, not elsewhere classified: Secondary | ICD-10-CM | POA: Diagnosis not present

## 2021-10-30 DIAGNOSIS — M2560 Stiffness of unspecified joint, not elsewhere classified: Secondary | ICD-10-CM | POA: Diagnosis not present

## 2021-10-30 DIAGNOSIS — M545 Low back pain, unspecified: Secondary | ICD-10-CM | POA: Diagnosis not present

## 2021-11-04 DIAGNOSIS — M2560 Stiffness of unspecified joint, not elsewhere classified: Secondary | ICD-10-CM | POA: Diagnosis not present

## 2021-11-04 DIAGNOSIS — M545 Low back pain, unspecified: Secondary | ICD-10-CM | POA: Diagnosis not present

## 2021-11-06 DIAGNOSIS — M545 Low back pain, unspecified: Secondary | ICD-10-CM | POA: Diagnosis not present

## 2021-11-06 DIAGNOSIS — M2560 Stiffness of unspecified joint, not elsewhere classified: Secondary | ICD-10-CM | POA: Diagnosis not present

## 2021-11-11 DIAGNOSIS — M2560 Stiffness of unspecified joint, not elsewhere classified: Secondary | ICD-10-CM | POA: Diagnosis not present

## 2021-11-11 DIAGNOSIS — M545 Low back pain, unspecified: Secondary | ICD-10-CM | POA: Diagnosis not present

## 2021-11-13 DIAGNOSIS — M2560 Stiffness of unspecified joint, not elsewhere classified: Secondary | ICD-10-CM | POA: Diagnosis not present

## 2021-11-13 DIAGNOSIS — M545 Low back pain, unspecified: Secondary | ICD-10-CM | POA: Diagnosis not present

## 2021-11-21 DIAGNOSIS — M48062 Spinal stenosis, lumbar region with neurogenic claudication: Secondary | ICD-10-CM | POA: Diagnosis not present

## 2021-11-21 DIAGNOSIS — M47896 Other spondylosis, lumbar region: Secondary | ICD-10-CM | POA: Diagnosis not present

## 2021-11-21 DIAGNOSIS — M5416 Radiculopathy, lumbar region: Secondary | ICD-10-CM | POA: Diagnosis not present

## 2021-11-21 DIAGNOSIS — M545 Low back pain, unspecified: Secondary | ICD-10-CM | POA: Diagnosis not present

## 2021-11-22 DIAGNOSIS — M2560 Stiffness of unspecified joint, not elsewhere classified: Secondary | ICD-10-CM | POA: Diagnosis not present

## 2021-11-22 DIAGNOSIS — M545 Low back pain, unspecified: Secondary | ICD-10-CM | POA: Diagnosis not present

## 2021-11-27 ENCOUNTER — Telehealth: Payer: Self-pay | Admitting: Urology

## 2021-11-27 NOTE — Telephone Encounter (Signed)
Pt brother stopped by today and was wondering if there was any reason that Dermody needs to be seen on 12/03/21 when he already has an appt with Aimee Wardell Heath, PA on 01/09/22?  Please call [place] to advise [phone]

## 2021-11-27 NOTE — Telephone Encounter (Signed)
Patient informed, has been seeing at [facility] urology. Will cancel follow up.

## 2021-11-29 DIAGNOSIS — M545 Low back pain, unspecified: Secondary | ICD-10-CM | POA: Diagnosis not present

## 2021-11-29 DIAGNOSIS — M2560 Stiffness of unspecified joint, not elsewhere classified: Secondary | ICD-10-CM | POA: Diagnosis not present

## 2021-12-03 ENCOUNTER — Ambulatory Visit: Payer: Medicaid Other | Admitting: Urology

## 2021-12-06 ENCOUNTER — Ambulatory Visit: Payer: Medicaid Other | Admitting: Nurse Practitioner

## 2021-12-08 NOTE — Patient Instructions (Signed)

## 2021-12-10 ENCOUNTER — Ambulatory Visit (INDEPENDENT_AMBULATORY_CARE_PROVIDER_SITE_OTHER): Payer: Medicaid Other | Admitting: Nurse Practitioner

## 2021-12-10 ENCOUNTER — Encounter: Payer: Self-pay | Admitting: Nurse Practitioner

## 2021-12-10 ENCOUNTER — Ambulatory Visit
Admission: RE | Admit: 2021-12-10 | Discharge: 2021-12-10 | Disposition: A | Discharge status: Home / Self Care | Payer: Medicaid Other | Source: Ambulatory Visit | Attending: Nurse Practitioner | Admitting: Nurse Practitioner

## 2021-12-10 ENCOUNTER — Ambulatory Visit
Admission: RE | Admit: 2021-12-10 | Discharge: 2021-12-10 | Disposition: A | Discharge status: Home / Self Care | Payer: Medicaid Other | Attending: Nurse Practitioner | Admitting: Nurse Practitioner

## 2021-12-10 VITALS — BP 138/80 | HR 76 | Temp 97.9°F | Wt 193.8 lb

## 2021-12-10 DIAGNOSIS — R0781 Pleurodynia: Secondary | ICD-10-CM

## 2021-12-10 DIAGNOSIS — E1169 Type 2 diabetes mellitus with other specified complication: Secondary | ICD-10-CM

## 2021-12-10 DIAGNOSIS — F17219 Nicotine dependence, cigarettes, with unspecified nicotine-induced disorders: Secondary | ICD-10-CM

## 2021-12-10 DIAGNOSIS — E785 Hyperlipidemia, unspecified: Secondary | ICD-10-CM

## 2021-12-10 DIAGNOSIS — W19XXXA Unspecified fall, initial encounter: Secondary | ICD-10-CM

## 2021-12-10 DIAGNOSIS — M545 Low back pain, unspecified: Secondary | ICD-10-CM

## 2021-12-10 DIAGNOSIS — I152 Hypertension secondary to endocrine disorders: Secondary | ICD-10-CM

## 2021-12-10 DIAGNOSIS — C220 Liver cell carcinoma: Secondary | ICD-10-CM

## 2021-12-10 DIAGNOSIS — E1140 Type 2 diabetes mellitus with diabetic neuropathy, unspecified: Secondary | ICD-10-CM

## 2021-12-10 DIAGNOSIS — M25511 Pain in right shoulder: Secondary | ICD-10-CM | POA: Insufficient documentation

## 2021-12-10 DIAGNOSIS — E1159 Type 2 diabetes mellitus with other circulatory complications: Secondary | ICD-10-CM

## 2021-12-10 DIAGNOSIS — F1010 Alcohol abuse, uncomplicated: Secondary | ICD-10-CM

## 2021-12-10 DIAGNOSIS — Z043 Encounter for examination and observation following other accident: Secondary | ICD-10-CM | POA: Diagnosis not present

## 2021-12-10 DIAGNOSIS — R0789 Other chest pain: Secondary | ICD-10-CM

## 2021-12-10 LAB — BAYER DCA HB A1C WAIVED: HB A1C (BAYER DCA - WAIVED): 9.6 % — ABNORMAL HIGH (ref 4.8–5.6)

## 2021-12-10 LAB — MICROALBUMIN, URINE WAIVED
Creatinine, Urine Waived: 50 mg/dL (ref 10–300)
Microalb, Ur Waived: 10 mg/L (ref 0–19)
Microalb/Creat Ratio: <30 mg/g (ref <30)

## 2021-12-10 MED ORDER — LIDOCAINE 5 % EX PTCH: 1.0000 | MEDICATED_PATCH | CUTANEOUS | 3 refills | Status: DC

## 2021-12-10 NOTE — Assessment & Plan Note (Signed)
Chronic, ongoing with A1c trending up to 9.6% today and urine ALB 08 December 2021.  Will reach out to patient and discuss starting Januvia or increasing Metformin to 1000 MG BID, would benefit GLP1 but with hepatic changes would need to use with caution if started. Recommend they check blood sugar daily and document + focus heavily on diabetic diet at home.   - No current ACE or ARB, discussed next visit, consider ARB due to smoker avoid ACE - Eye exam up to date - Foot exam up to date - Pneumococcal up to date

## 2021-12-10 NOTE — Progress Notes (Signed)
BP 138/80 (BP Location: Left Arm, Patient Position: Sitting)   Pulse 76   Temp 97.9 F (36.6 C) (Oral)   Wt 193 lb 12.8 oz (87.9 kg)   SpO2 97%   BMI 24.87 kg/m    Subjective:    Patient ID: Johnny Kelley, male    DOB: April 11, 1965, 56 y.o.   MRN: [id number]  HPI: Johnny Kelley is a 56 y.o. male  Chief Complaint  Patient presents with   Fall    Patient is here to be seen for a recent Fall. Patient says his feet slipped out from under him. Patient says he hit his head and may have cracked some of his ribs, they way it feels. Patient says the fall happened about two weekends ago. Patient says when he gets up mainly, it feels as if someone is stabbing stabbing him his chest and radiates around to his back.    Head Injury    Patient says when he fell he hit his head and is having pain. Patient says that PT wanted patient evaluated before he can resume PT, as patient has a history of a stroke.    RIB PAIN Had a fall 1 1/2 weeks ago while trying to get to bathroom, feet fell out from under him.  Fell onto [employer] can, hit right upper ribs and hit right side of head.  He was able to get back up at that time with assistance.  Reports since that time having right upper rib pain + soreness to head.  Some arm loss of strength too on right side. Denies LOC or syncope at time.  He is concerned about possible TIA as felt off before fall.  Was in PT, but they will not see him again until fall issues have been assessed and imaging done.  History of stroke about 2 years ago.  Duration: weeks Mechanism of injury:  fall Location: right upper ribs Onset: gradual Severity: 9/10 Quality: dull and aching, sharp. stabbing Frequency: intermittent Radiation: none Aggravating factors: lifting and movement Alleviating factors: APAP Status: stable Treatments attempted: APAP  Relief with NSAIDs?: No NSAIDs Taken Nighttime pain:   when rolls over in bed Paresthesias / decreased sensation:   no Fevers:  no  DIABETES Continues on Jardiance and Metformin with last A1c 6.2% in February, no recent A1c in chart.  He is followed by oncology for liver cancer with last visit on 09/20/21.  He reports he does continue to drink every now and then, every couple nights -- 1 beer. Hypoglycemic episodes:no Polydipsia/polyuria: no Visual disturbance: no Chest pain: no Paresthesias: no Glucose Monitoring: no  Accucheck frequency: Not Checking  Fasting glucose:  Post prandial:  Evening:  Before meals: Taking Insulin?: no  Long acting insulin:  Short acting insulin: Blood Pressure Monitoring: not checking Retinal Examination: Up to Date Foot Exam: Up to Date Pneumovax: Up to Date Influenza:  refuses Aspirin: no   HYPERTENSION / HYPERLIPIDEMIA Taking Metoprolol, HCTZ, no current statin due to liver CA.  Last saw cardiology on 07/02/21 for CAD and PSVT.   Satisfied with current treatment? yes Duration of hypertension: chronic BP monitoring frequency: daily BP range:  BP medication side effects: no Duration of hyperlipidemia: chronic Aspirin: no Recent stressors: no Recurrent headaches: no Visual changes: no Palpitations: no Dyspnea: no Chest pain: no Lower extremity edema: no Dizzy/lightheaded: no  The ASCVD Risk score (Arnett DK, et al., 2019) failed to calculate for the following reasons:   The patient has  a prior MI or stroke diagnosis   Relevant past medical, surgical, family and social history reviewed and updated as indicated. Interim medical history since our last visit reviewed. Allergies and medications reviewed and updated.  Review of Systems  Constitutional:  Negative for activity change, diaphoresis, fatigue and fever.  Respiratory:  Negative for cough, chest tightness, shortness of breath and wheezing.   Cardiovascular:  Negative for chest pain, palpitations and leg swelling.  Gastrointestinal: Negative.   Endocrine: Negative for polydipsia, polyphagia and  polyuria.  Musculoskeletal:  Positive for arthralgias.  Neurological:  Positive for numbness (right arm). Negative for dizziness, syncope, weakness, light-headedness and headaches.  Psychiatric/Behavioral: Negative.      Per HPI unless specifically indicated above     Objective:    BP 138/80 (BP Location: Left Arm, Patient Position: Sitting)   Pulse 76   Temp 97.9 F (36.6 C) (Oral)   Wt 193 lb 12.8 oz (87.9 kg)   SpO2 97%   BMI 24.87 kg/m   Wt Readings from Last 3 Encounters:  12/10/21 193 lb 12.8 oz (87.9 kg)  09/24/21 200 lb 9.6 oz (91 kg)  09/20/21 200 lb 8 oz (90.9 kg)    Physical Exam Vitals and nursing note reviewed.  Constitutional:      General: He is awake. He is not in acute distress.    Appearance: He is well-developed and well-groomed. He is not ill-appearing or toxic-appearing.  HENT:     Head: Normocephalic and atraumatic. No abrasion, contusion or laceration.     Right Ear: Hearing normal. No drainage.     Left Ear: Hearing normal. No drainage.  Eyes:     General: Lids are normal.        Right eye: No discharge.        Left eye: No discharge.     Extraocular Movements: Extraocular movements intact.     Conjunctiva/sclera: Conjunctivae normal.     Pupils: Pupils are equal, round, and reactive to light.     Visual Fields: Right eye visual fields normal and left eye visual fields normal.  Neck:     Thyroid: No thyromegaly.     Vascular: No carotid bruit.     Trachea: Trachea normal.  Cardiovascular:     Rate and Rhythm: Normal rate and regular rhythm.     Heart sounds: Normal heart sounds, S1 normal and S2 normal. No murmur heard.    No gallop.  Pulmonary:     Effort: Pulmonary effort is normal. No accessory muscle usage or respiratory distress.     Breath sounds: Normal breath sounds.     Comments: Tenderness to anterior upper rib area, no rashes or bruising noted.  No crepitus. Abdominal:     General: Bowel sounds are normal. There is no  distension.     Palpations: Abdomen is soft.     Tenderness: There is no abdominal tenderness.  Musculoskeletal:        General: Normal range of motion.     Cervical back: Normal range of motion and neck supple.     Right lower leg: No edema.     Left lower leg: No edema.  Lymphadenopathy:     Cervical: No cervical adenopathy.  Skin:    General: Skin is warm and dry.     Capillary Refill: Capillary refill takes less than 2 seconds.     Findings: No rash.  Neurological:     Mental Status: He is alert and oriented to person, place, and  time.     Cranial Nerves: Cranial nerves 2-12 are intact.     Motor: Motor function is intact.     Coordination: Coordination is intact.     Gait: Gait is intact.     Deep Tendon Reflexes: Reflexes are normal and symmetric.     Reflex Scores:      Brachioradialis reflexes are 2+ on the right side and 2+ on the left side.      Patellar reflexes are 2+ on the right side and 2+ on the left side.    Comments: Grip strength bilateral 5/5.  Sensation normal bilateral upper extremities.  Psychiatric:        Attention and Perception: Attention normal.        Mood and Affect: Mood normal.        Speech: Speech normal.        Behavior: Behavior normal. Behavior is cooperative.        Thought Content: Thought content normal.    Results for orders placed or performed in visit on 12/10/21  Bayer DCA Hb A1c Waived  Result Value Ref Range   HB A1C (BAYER DCA - WAIVED) 9.6 (H) 4.8 - 5.6 %  Microalbumin, Urine Waived  Result Value Ref Range   Microalb, Ur Waived 10 0 - 19 mg/L   Creatinine, Urine Waived 50 10 - 300 mg/dL   Microalb/Creat Ratio <30 <30 mg/g      Assessment & Plan:   Problem List Items Addressed This Visit       Cardiovascular and Mediastinum   Hypertension associated with diabetes ([place])    Chronic, ongoing with initial BP elevated, repeat at goal.  Recommend he monitor BP at least a few mornings a week at home and document.  DASH diet at  home.  Continue current medication regimen and adjust as needed.  Labs today: CMP and TSH.  Return in 3 months.       Relevant Orders   Bayer DCA Hb A1c Waived (Completed)   Microalbumin, Urine Waived (Completed)   Comprehensive metabolic panel   TSH     Digestive   Hepatocellular carcinoma (HCC)    Chronic, followed by oncology, continue this collaboration.  Recent notes reviewed.      Relevant Orders   Comprehensive metabolic panel     Endocrine   Hyperlipidemia associated with type 2 diabetes mellitus (HCC)    Chronic, ongoing.  They report no current statin due to liver cancer.  Lipid panel today.         Relevant Orders   Bayer DCA Hb A1c Waived (Completed)   Comprehensive metabolic panel   Lipid Panel w/o Chol/HDL Ratio   Type 2 diabetes mellitus with diabetic neuropathy, without long-term current use of insulin (HCC) - Primary    Chronic, ongoing with A1c trending up to 9.6% today and urine ALB 08 December 2021.  Will reach out to patient and discuss starting Januvia or increasing Metformin to 1000 MG BID, would benefit GLP1 but with hepatic changes would need to use with caution if started. Recommend they check blood sugar daily and document + focus heavily on diabetic diet at home.   - No current ACE or ARB, discussed next visit, consider ARB due to smoker avoid ACE - Eye exam up to date - Foot exam up to date - Pneumococcal up to date      Relevant Orders   Bayer DCA Hb A1c Waived (Completed)   Microalbumin, Urine Waived (Completed)  Nervous and Auditory   Nicotine dependence, cigarettes, w unsp disorders    I have recommended complete cessation of tobacco use. I have discussed various options available for assistance with tobacco cessation including over the counter methods (Nicotine gum, patch and lozenges). We also discussed prescription options (Chantix, Nicotine Inhaler / Nasal Spray). The patient is not interested in pursuing any prescription tobacco  cessation options at this time.        Other   Alcohol abuse    Ongoing, recommend continued cut back on alcohol use.      Fall    Recent fall 1 1/2 weeks ago, can not return to PT until further assessed.  Lidocaine patch refills sent.  Concern due to report of feeling off before fall and history of CVA + current right arm mild numbness -- obtain CT head w/o contrast and imaging of right ribs.  Return to PT if overall imaging stable.      Relevant Orders   DG Ribs Unilateral Right   DG Shoulder Right   CT HEAD WO CONTRAST (5MM)   Rib pain on right side    Obtain imaging of right ribs to further assess due to fall.  Tylenol as needed and Lidocaine patches. May return to PT if imaging overall stable.      Relevant Orders   DG Ribs Unilateral Right     Follow up plan: Return in about 3 months (around 03/11/2022) for T2DM, HTN/HLD, LIVER CA.

## 2021-12-10 NOTE — Assessment & Plan Note (Signed)
Recent fall 1 1/2 weeks ago, can not return to PT until further assessed.  Lidocaine patch refills sent.  Concern due to report of feeling off before fall and history of CVA + current right arm mild numbness -- obtain CT head w/o contrast and imaging of right ribs.  Return to PT if overall imaging stable.

## 2021-12-10 NOTE — Assessment & Plan Note (Signed)
Chronic, followed by oncology, continue this collaboration.  Recent notes reviewed. 

## 2021-12-10 NOTE — Assessment & Plan Note (Signed)
Obtain imaging of right ribs to further assess due to fall.  Tylenol as needed and Lidocaine patches. May return to PT if imaging overall stable.

## 2021-12-10 NOTE — Assessment & Plan Note (Signed)
Ongoing, recommend continued cut back on alcohol use.

## 2021-12-10 NOTE — Assessment & Plan Note (Signed)
Chronic, ongoing with initial BP elevated, repeat at goal.  Recommend he monitor BP at least a few mornings a week at home and document.  DASH diet at home.  Continue current medication regimen and adjust as needed.  Labs today: CMP and TSH.  Return in 3 months.

## 2021-12-10 NOTE — Assessment & Plan Note (Signed)
Chronic, ongoing.  They report no current statin due to liver cancer.  Lipid panel today.    

## 2021-12-10 NOTE — Assessment & Plan Note (Signed)
I have recommended complete cessation of tobacco use. I have discussed various options available for assistance with tobacco cessation including over the counter methods (Nicotine gum, patch and lozenges). We also discussed prescription options (Chantix, Nicotine Inhaler / Nasal Spray). The patient is not interested in pursuing any prescription tobacco cessation options at this time.  

## 2021-12-11 ENCOUNTER — Other Ambulatory Visit: Payer: Self-pay | Admitting: Nurse Practitioner

## 2021-12-11 LAB — LIPID PANEL W/O CHOL/HDL RATIO
Cholesterol, Total: 121 mg/dL (ref 100–199)
HDL: 60 mg/dL (ref >39)
LDL Chol Calc (NIH): 44 mg/dL (ref 0–99)
Triglycerides: 86 mg/dL (ref 0–149)
VLDL Cholesterol Cal: 17 mg/dL (ref 5–40)

## 2021-12-11 LAB — COMPREHENSIVE METABOLIC PANEL
ALT: 77 IU/L — ABNORMAL HIGH (ref 0–44)
AST: 96 IU/L — ABNORMAL HIGH (ref 0–40)
Albumin/Globulin Ratio: 0.9 — ABNORMAL LOW (ref 1.2–2.2)
Albumin: 3.3 g/dL — ABNORMAL LOW (ref 3.8–4.9)
Alkaline Phosphatase: 222 IU/L — ABNORMAL HIGH (ref 44–121)
BUN/Creatinine Ratio: 9 (ref 9–20)
BUN: 8 mg/dL (ref 6–24)
Bilirubin Total: 3.4 mg/dL — ABNORMAL HIGH (ref 0.0–1.2)
CO2: 22 mmol/L (ref 20–29)
Calcium: 9.3 mg/dL (ref 8.7–10.2)
Chloride: 95 mmol/L — ABNORMAL LOW (ref 96–106)
Creatinine, Ser: 0.86 mg/dL (ref 0.76–1.27)
Globulin, Total: 3.6 g/dL (ref 1.5–4.5)
Glucose: 390 mg/dL — ABNORMAL HIGH (ref 70–99)
Potassium: 4.1 mmol/L (ref 3.5–5.2)
Sodium: 129 mmol/L — ABNORMAL LOW (ref 134–144)
Total Protein: 6.9 g/dL (ref 6.0–8.5)
eGFR: 100 mL/min/{1.73_m2} (ref >59)

## 2021-12-11 LAB — TSH: TSH: 1.7 u[IU]/mL (ref 0.450–4.500)

## 2021-12-11 MED ORDER — SITAGLIPTIN PHOSPHATE 100 MG PO TABS: 100.0000 mg | ORAL_TABLET | Freq: Every day | ORAL | 4 refills | Status: DC

## 2021-12-11 NOTE — Progress Notes (Signed)
Good morning, please let Raylee now his labs have returned and most remain at baseline -- liver function testing is elevated but at baseline for him, continue to visit with oncology providers. - Sodium was a bit low, please add some table salt to your diet daily and I would like to recheck this in 4 weeks.  Decrease alcohol intake. - A1c was elevated at 9.6%, previous was 6.2% -- this is a BIG change.  I am going to send in a medication called Januvia for you to start taking daily.  Take this with your Metformin and Jardiance.  I would like you to return in office for follow-up visit in 4 weeks for this.  Check blood sugar daily and document, fasting in morning.  Please schedule follow-up for 4 weeks.  Watch diet closely, reduce alcohol and sweets. Any questions? Keep being stellar!!  Thank you for allowing me to participate in your care.  I appreciate you. Kindest regards, Kineta Fudala

## 2021-12-12 ENCOUNTER — Ambulatory Visit: Payer: Medicaid Other | Admitting: Podiatry

## 2021-12-12 ENCOUNTER — Encounter: Payer: Self-pay | Admitting: Podiatry

## 2021-12-12 DIAGNOSIS — M79675 Pain in left toe(s): Secondary | ICD-10-CM

## 2021-12-12 DIAGNOSIS — B351 Tinea unguium: Secondary | ICD-10-CM

## 2021-12-12 DIAGNOSIS — M79674 Pain in right toe(s): Secondary | ICD-10-CM | POA: Diagnosis not present

## 2021-12-12 DIAGNOSIS — E1140 Type 2 diabetes mellitus with diabetic neuropathy, unspecified: Secondary | ICD-10-CM

## 2021-12-12 NOTE — Progress Notes (Signed)
This patient returns to my office for at risk foot care.  This patient requires this care by a professional since this patient will be at risk due to having diabetes.  This patient is unable to cut nails himself since the patient cannot reach his nails.These nails are painful walking and wearing shoes.  This patient presents for at risk foot care today.  General Appearance  Alert, conversant and in no acute stress.  Vascular  Dorsalis pedis and posterior tibial  pulses are palpable  bilaterally.  Capillary return is within normal limits  bilaterally. Temperature is within normal limits  bilaterally.  Neurologic  Senn-Weinstein monofilament wire test within normal limits  bilaterally. Muscle power within normal limits bilaterally.  Nails Thick disfigured discolored nails with subungual debris  hallux  bilaterally. No evidence of bacterial infection or drainage bilaterally.  Orthopedic  No limitations of motion  feet .  No crepitus or effusions noted.  No bony pathology or digital deformities noted.  Skin  normotropic skin with no porokeratosis noted bilaterally.  No signs of infections or ulcers noted.     Onychomycosis  Pain in right toes  Pain in left toes  Consent was obtained for treatment procedures.   Mechanical debridement of nails 1-5  bilaterally performed with a nail nipper.  Filed with dremel without incident.    Return office visit    3 months                 Told patient to return for periodic foot care and evaluation due to potential at risk complications.   Deysy Schabel DPM   

## 2021-12-12 NOTE — Progress Notes (Signed)
Good afternoon, please let Keiron know shoulder and rib imaging returned negative for fractures.  Suspect more bruising present.  Continue rest and medication as needed + over the counter Voltaren gel or Icy/Hot.  Any questions? Keep being stellar!!  Thank you for allowing me to participate in your care.  I appreciate you. Kindest regards, Ernan Runkles

## 2021-12-12 NOTE — Progress Notes (Signed)
Resulted with shoulder imaging

## 2021-12-16 ENCOUNTER — Ambulatory Visit
Admission: RE | Admit: 2021-12-16 | Discharge: 2021-12-16 | Disposition: A | Discharge status: Home / Self Care | Payer: Medicaid Other | Source: Ambulatory Visit | Attending: Oncology | Admitting: Oncology

## 2021-12-16 DIAGNOSIS — C220 Liver cell carcinoma: Secondary | ICD-10-CM | POA: Diagnosis not present

## 2021-12-16 MED ORDER — GADOBUTROL 1 MMOL/ML IV SOLN
8.0000 mL | Freq: Once | INTRAVENOUS | Status: AC | PRN
  Administered 2021-12-16: 8 mL via INTRAVENOUS

## 2021-12-20 ENCOUNTER — Other Ambulatory Visit: Payer: Self-pay | Admitting: *Deleted

## 2021-12-20 DIAGNOSIS — C220 Liver cell carcinoma: Secondary | ICD-10-CM

## 2021-12-23 ENCOUNTER — Inpatient Hospital Stay (HOSPITAL_BASED_OUTPATIENT_CLINIC_OR_DEPARTMENT_OTHER): Payer: Medicaid Other | Admitting: Oncology

## 2021-12-23 ENCOUNTER — Other Ambulatory Visit: Payer: Self-pay

## 2021-12-23 ENCOUNTER — Encounter: Payer: Self-pay | Admitting: Oncology

## 2021-12-23 ENCOUNTER — Telehealth: Payer: Self-pay

## 2021-12-23 ENCOUNTER — Ambulatory Visit: Payer: Self-pay | Admitting: *Deleted

## 2021-12-23 ENCOUNTER — Inpatient Hospital Stay: Payer: Medicaid Other | Attending: Oncology

## 2021-12-23 VITALS — BP 123/73 | HR 77 | Temp 98.1°F | Resp 18 | Wt 190.1 lb

## 2021-12-23 DIAGNOSIS — F17210 Nicotine dependence, cigarettes, uncomplicated: Secondary | ICD-10-CM | POA: Insufficient documentation

## 2021-12-23 DIAGNOSIS — Z801 Family history of malignant neoplasm of trachea, bronchus and lung: Secondary | ICD-10-CM | POA: Diagnosis not present

## 2021-12-23 DIAGNOSIS — C220 Liver cell carcinoma: Secondary | ICD-10-CM | POA: Insufficient documentation

## 2021-12-23 DIAGNOSIS — R42 Dizziness and giddiness: Secondary | ICD-10-CM | POA: Diagnosis not present

## 2021-12-23 DIAGNOSIS — Z800 Family history of malignant neoplasm of digestive organs: Secondary | ICD-10-CM | POA: Insufficient documentation

## 2021-12-23 LAB — CBC WITH DIFFERENTIAL/PLATELET
Abs Immature Granulocytes: 0.03 10*3/uL (ref 0.00–0.07)
Basophils Absolute: 0 10*3/uL (ref 0.0–0.1)
Basophils Relative: 0 %
Eosinophils Absolute: 0 10*3/uL (ref 0.0–0.5)
Eosinophils Relative: 0 %
HCT: 41.3 % (ref 39.0–52.0)
Hemoglobin: 14.6 g/dL (ref 13.0–17.0)
Immature Granulocytes: 0 %
Lymphocytes Relative: 22 %
Lymphs Abs: 1.6 10*3/uL (ref 0.7–4.0)
MCH: 35.2 pg — ABNORMAL HIGH (ref 26.0–34.0)
MCHC: 35.4 g/dL (ref 30.0–36.0)
MCV: 99.5 fL (ref 80.0–100.0)
Monocytes Absolute: 0.8 10*3/uL (ref 0.1–1.0)
Monocytes Relative: 11 %
Neutro Abs: 4.6 10*3/uL (ref 1.7–7.7)
Neutrophils Relative %: 67 %
Platelets: 80 10*3/uL — ABNORMAL LOW (ref 150–400)
RBC: 4.15 MIL/uL — ABNORMAL LOW (ref 4.22–5.81)
RDW: 13.8 % (ref 11.5–15.5)
WBC: 7 10*3/uL (ref 4.0–10.5)
nRBC: 0 % (ref 0.0–0.2)

## 2021-12-23 LAB — COMPREHENSIVE METABOLIC PANEL
ALT: 47 U/L — ABNORMAL HIGH (ref 0–44)
AST: 66 U/L — ABNORMAL HIGH (ref 15–41)
Albumin: 3.1 g/dL — ABNORMAL LOW (ref 3.5–5.0)
Alkaline Phosphatase: 151 U/L — ABNORMAL HIGH (ref 38–126)
Anion gap: 10 (ref 5–15)
BUN: 14 mg/dL (ref 6–20)
CO2: 30 mmol/L (ref 22–32)
Calcium: 8.9 mg/dL (ref 8.9–10.3)
Chloride: 88 mmol/L — ABNORMAL LOW (ref 98–111)
Creatinine, Ser: 1.32 mg/dL — ABNORMAL HIGH (ref 0.61–1.24)
GFR, Estimated: >60 mL/min (ref >60)
Glucose, Bld: 439 mg/dL — ABNORMAL HIGH (ref 70–99)
Potassium: 3.8 mmol/L (ref 3.5–5.1)
Sodium: 128 mmol/L — ABNORMAL LOW (ref 135–145)
Total Bilirubin: 2.9 mg/dL — ABNORMAL HIGH (ref 0.3–1.2)
Total Protein: 7.2 g/dL (ref 6.5–8.1)

## 2021-12-23 LAB — IRON AND TIBC
Iron: 81 ug/dL (ref 45–182)
Saturation Ratios: 33 % (ref 17.9–39.5)
TIBC: 242 ug/dL — ABNORMAL LOW (ref 250–450)
UIBC: 161 ug/dL

## 2021-12-23 LAB — VITAMIN B12: Vitamin B-12: 1009 pg/mL — ABNORMAL HIGH (ref 180–914)

## 2021-12-23 LAB — FERRITIN: Ferritin: 407 ng/mL — ABNORMAL HIGH (ref 24–336)

## 2021-12-23 NOTE — Progress Notes (Signed)
Hematology/Oncology Consult note [facility]  [phone] [phone]  Patient Care Team: Charlynne Cousins, MD (Inactive) as PCP - General (Internal Medicine) Kate Sable, MD as PCP - Cardiology (Cardiology) Clent Jacks, RN as Oncology Nurse Navigator   Name of the patient: Johnny Kelley  [id number]  [birth year]   Date of visit: 12/23/21  Diagnosis-  1.  Nonmetastatic hepatocellular carcinoma 2.  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  He was noted to have abnormal LFTs.  In July 2021 he was found to have an elevated bilirubin of 1.9 with an AST ALT of 128 and 86 respectively and underwent a right upper quadrant ultrasound which showed a heterogeneous abnormality in the right hepatic lobe measuring 4.1 x 4.1 x 3.5 cm.  He was then seen by Dr. Allen Norris and underwent MRI liver with and without contrast which showed a 3.8 cm enhancing lesion in segment 6 compatible with Bearden lie RADS 5.     Results of blood work from 01/13/2020 reveal a normal albumin level of 3.5.  AST and ALT mildly elevated at 70 and 51 respectively with a total bilirubin of 1.6.  PT/INR was normal.  Platelet count was mildly low at 105.  CT chest with contrast did not reveal any evidence of metastatic disease.  Patient underwent embolization for segment 6 hepatic lesion with interventional radiology at [facility] in November 2022.    Interval history-patient reports feeling dizzy occasionally.  His blood sugars have been out of control and are being adjusted by his primary care doctor.  ECOG PS- 1 Pain scale- 0   Review of systems- Review of Systems  Constitutional:  Positive for malaise/fatigue.  Negative for chills, fever and weight loss.  HENT:  Negative for congestion, ear discharge and nosebleeds.   Eyes:  Negative for blurred vision.  Respiratory:  Negative for cough, hemoptysis, sputum production, shortness of breath and wheezing.   Cardiovascular:  Negative for chest pain, palpitations, orthopnea and claudication.  Gastrointestinal:  Negative for abdominal pain, blood in stool, constipation, diarrhea, heartburn, melena, nausea and vomiting.  Genitourinary:  Negative for dysuria, flank pain, frequency, hematuria and urgency.  Musculoskeletal:  Negative for back pain, joint pain and myalgias.  Skin:  Negative for rash.  Neurological:  Positive for dizziness. Negative for tingling, focal weakness, seizures, weakness and headaches.  Endo/Heme/Allergies:  Does not bruise/bleed easily.  Psychiatric/Behavioral:  Negative for depression and suicidal ideas. The patient does not have insomnia.       No Known Allergies   Past Medical History:  Diagnosis Date   Cancer, hepatocellular ([place])    Diabetes mellitus without complication ([place])    Hepatitis C    Hypertension    Psoriasis    Stroke ([place])    Hx stroke in 2020   Substance abuse [facility])      Past Surgical History:  Procedure Laterality Date   IR RADIOLOGIST EVAL & MGMT  01/19/2020   IR RADIOLOGIST EVAL & MGMT  01/31/2020   IR RADIOLOGIST EVAL & MGMT  04/12/2020   IR RADIOLOGIST EVAL & MGMT  08/30/2020   KNEE SURGERY Right     Social History   Socioeconomic History   Marital status: Unknown    Spouse name: Not on  file   Number of children: Not on file   Years of education: Not on file   Highest education level: Not on file  Occupational History   Occupation: unemployed  Tobacco Use   Smoking status: Every Day    Packs/day: 0.10    Years: 38.00    Total pack years: 3.80    Types: Cigarettes   Smokeless tobacco: Never  Vaping Use   Vaping Use: Never used  Substance and Sexual Activity   Alcohol use: Yes     Alcohol/week: 21.0 standard drinks of alcohol    Types: 21 Cans of beer per week    Comment: 1 beer a day   Drug use: Yes    Frequency: 1.0 times per week    Types: Marijuana, Cocaine    Comment: reports marijuana use once per week and cocaine use 1-2 monthper month   Sexual activity: Not Currently  Other Topics Concern   Not on file  Social History Narrative   Not on file   Social Determinants of Health   Financial Resource Strain: Not on file  Food Insecurity: Not on file  Transportation Needs: No Transportation Needs (12/01/2019)   PRAPARE - Hydrologist (Medical): No    Lack of Transportation (Non-Medical): No  Physical Activity: Not on file  Stress: Stress Concern Present (12/01/2019)   [place]    Feeling of Stress : To some extent  Social Connections: Socially Isolated (12/01/2019)   Social Connection and Isolation Panel [NHANES]    Frequency of Communication with Friends and Family: Never    Frequency of Social Gatherings with Friends and Family: More than three times a week    Attends Religious Services: Never    Marine scientist or Organizations: No    Attends Music therapist: Never    Marital Status: Divorced  Human resources officer Violence: Not on file    Family History  Problem Relation Age of Onset   Diabetes Mother    Colon cancer Father    Diabetes Father    Kidney disease Maternal Grandmother    Lung cancer Maternal Grandfather    Hyperlipidemia Paternal Grandmother    COPD Paternal Grandfather      Current Outpatient Medications:    blood glucose meter kit and supplies KIT, Dispense based on patient and insurance preference. Use up to four times daily as directed. (FOR ICD-9 250.00, 250.01)., Disp: 1 each, Rfl: 0   clopidogrel (PLAVIX) 75 MG tablet, clopidogrel 75 mg tablet  TAKE 1 TABLET BY MOUTH EVERY DAY, Disp: , Rfl:    clotrimazole  (LOTRIMIN) 1 % cream, clotrimazole 1 % topical cream, Disp: , Rfl:    empagliflozin (JARDIANCE) 25 MG TABS tablet, Take 1 tablet (25 mg total) by mouth daily., Disp: 90 tablet, Rfl: 5   famotidine (PEPCID) 20 MG tablet, TAKE 1 TABLET BY MOUTH EVERYDAY AT BEDTIME, Disp: 90 tablet, Rfl: 1   gabapentin (NEURONTIN) 100 MG capsule, Take 1 capsule (100 mg total) by mouth daily., Disp: 90 capsule, Rfl: 3   hydrochlorothiazide (HYDRODIURIL) 25 MG tablet, Take 1 tablet (25 mg total) by mouth daily., Disp: 90 tablet, Rfl: 3   lidocaine (LIDODERM) 5 %, Place 1 patch onto the skin daily. Remove & Discard patch within 12 hours or as directed by MD, Disp: 30 patch, Rfl: 3   losartan (COZAAR) 100 MG tablet, Take 1 tablet (100 mg total) by mouth daily.,  Disp: 30 tablet, Rfl: 4   meloxicam (MOBIC) 7.5 MG tablet, Take 7.5 mg by mouth daily., Disp: , Rfl:    metFORMIN (GLUCOPHAGE) 1000 MG tablet, Take 1 tablet (1,000 mg total) by mouth daily., Disp: 90 tablet, Rfl: 1   metoprolol succinate (TOPROL XL) 25 MG 24 hr tablet, Take 1 tablet (25 mg total) by mouth daily., Disp: 30 tablet, Rfl: 5   sitaGLIPtin (JANUVIA) 100 MG tablet, Take 1 tablet (100 mg total) by mouth daily., Disp: 90 tablet, Rfl: 4   tamsulosin (FLOMAX) 0.4 MG CAPS capsule, Take 1 capsule (0.4 mg total) by mouth daily., Disp: 30 capsule, Rfl: 11   terbinafine (LAMISIL) 250 MG tablet, Take 1 tablet (250 mg total) by mouth daily., Disp: 30 tablet, Rfl: 0   tiZANidine (ZANAFLEX) 4 MG tablet, Take 4 mg by mouth every 6 (six) hours as needed., Disp: , Rfl:    fluorouracil (EFUDEX) 5 % cream, Apply topically 2 (two) times daily. On November 29, 2021 start cream bid for 7 days to forehead, temples and nose, Disp: 15 g, Rfl: 1   solifenacin (VESICARE) 10 MG tablet, solifenacin 10 mg tablet  TAKE 1 TABLET (10 MG TOTAL) BY MOUTH DAILY. MAY CAUSE CONSTIPATION/DRY MOUTH., Disp: , Rfl:   Physical exam:  Vitals:   12/23/21 0957  BP: 123/73  Pulse: 77  Resp: 18   Temp: 98.1 F (36.7 C)  SpO2: 99%  Weight: 190 lb 1.6 oz (86.2 kg)   Physical Exam Constitutional:      General: He is not in acute distress. Cardiovascular:     Rate and Rhythm: Normal rate and regular rhythm.     Heart sounds: Normal heart sounds.  Pulmonary:     Effort: Pulmonary effort is normal.     Breath sounds: Normal breath sounds.  Abdominal:     General: Bowel sounds are normal.     Palpations: Abdomen is soft.  Skin:    General: Skin is warm and dry.  Neurological:     Mental Status: He is alert and oriented to person, place, and time.         Latest Ref Rng & Units 12/23/2021    9:35 AM  CMP  Glucose 70 - 99 mg/dL 439   BUN 6 - 20 mg/dL 14   Creatinine 0.61 - 1.24 mg/dL 1.32   Sodium 135 - 145 mmol/L 128   Potassium 3.5 - 5.1 mmol/L 3.8   Chloride 98 - 111 mmol/L 88   CO2 22 - 32 mmol/L 30   Calcium 8.9 - 10.3 mg/dL 8.9   Total Protein 6.5 - 8.1 g/dL 7.2   Total Bilirubin 0.3 - 1.2 mg/dL 2.9   Alkaline Phos 38 - 126 U/L 151   AST 15 - 41 U/L 66   ALT 0 - 44 U/L 47       Latest Ref Rng & Units 12/23/2021    9:35 AM  CBC  WBC 4.0 - 10.5 K/uL 7.0   Hemoglobin 13.0 - 17.0 g/dL 14.6   Hematocrit 39.0 - 52.0 % 41.3   Platelets 150 - 400 K/uL 80     No images are attached to the encounter.  MR Abdomen W Wo Contrast  Result Date: 12/16/2021 CLINICAL DATA:  History of HCC status post embolization 01/2021 EXAM: MRI ABDOMEN WITHOUT AND WITH CONTRAST TECHNIQUE: Multiplanar multisequence MR imaging of the abdomen was performed both before and after the administration of intravenous contrast. CONTRAST:  72m GADAVIST GADOBUTROL 1 MMOL/ML  IV SOLN COMPARISON:  MRI abdomen dated 08/07/2021, 04/30/2021, 12/13/2020 FINDINGS: Lower chest: No acute findings. Hepatobiliary: Cirrhotic liver morphology. Similar appearance of post ablation changes in segment 6. Increased size and conspicuity of nodular enhancement along the anteromedial ablation zone measuring 1.6 x 1.1 cm  (13:33) without washout, previously 1.0 by 0.9 cm on 08/07/2021. Similar size of residual area of enhancement along the superior aspect of the ablation zone measures 1.4 x 0.9 cm (13:27). Multiple observations of subcentimeter arterial enhancement in segment 6 and 7 are increased in conspicuity compared to 08/07/2021, allowing for differences in technique. Nonenhancing T1 and T2 hypointense focus in segment 7 (13:21) demonstrates loss of signal on out of phase imaging and likely represents a regenerative nodule. No bile duct dilation.  Cholelithiasis without acute cholecystitis. Pancreas: No mass, inflammatory changes, or other parenchymal abnormality identified. Spleen:  Within normal limits in size and appearance. Adrenals/Urinary Tract: No adrenal nodules. No renal masses identified. No evidence of hydronephrosis. Stomach/Bowel: Visualized portions within the abdomen are unremarkable. Vascular/Lymphatic: No pathologically enlarged lymph nodes identified. No abdominal aortic aneurysm demonstrated. Aortic atherosclerosis. Other:  None. Musculoskeletal: No suspicious bone lesions identified. IMPRESSION: 1. Post ablation changes of hepatic segment 6 with increased size and conspicuity of irregular enhancement along the anteromedial ablation zone and similar size of previously described enhancement along the superior ablation zone. LI-RADS TR equivocal. 2. Increased conspicuity of multifocal subcentimeter observations of arterial enhancement is likely related to differences in technique. LI-RADS 3. 3.  Aortic Atherosclerosis (ICD10-I70.0). Electronically Signed   By: Darrin Nipper M.D.   On: 12/16/2021 16:23   DG Shoulder Right  Result Date: 12/12/2021 CLINICAL DATA:  History of recent fall. EXAM: RIGHT SHOULDER - 2+ VIEW COMPARISON:  None Available. FINDINGS: There is no evidence of fracture or dislocation. There is no evidence of arthropathy or other focal bone abnormality. Soft tissues are unremarkable. IMPRESSION:  Negative. Electronically Signed   By: Virgina Norfolk M.D.   On: 12/12/2021 02:56   DG Ribs Unilateral Right  Result Date: 12/12/2021 CLINICAL DATA:  History of recent fall. EXAM: RIGHT RIBS - 2 VIEW COMPARISON:  None Available. FINDINGS: A radiopaque marker was placed at the site of the patient's pain. No fracture or other bone lesions are seen involving the ribs. IMPRESSION: Negative. Electronically Signed   By: Virgina Norfolk M.D.   On: 12/12/2021 02:54     Assessment and plan- Patient is a 56 y.o. male who is here for follow-up of following issues  Hepatocellular carcinoma: I have reviewed MRI abdomen images independently and discussed findings with the patient which does not show any evidence of new or progressive disease.  There are stable post ablation changes in segment 6 with continued appearance of enhancement in the anterior medial ablation zone.  I plan to follow-up on these findings with a repeat MRI in 4 months.  I will also discuss his case at tumor board next week.  Abnormal MRI spine: Work-up in the past has been negative.  I will follow-up onFindings of enhancement seen in the thoracic and lumbar spine with a repeat MRI in January 2024.  Hyperglycemia: Uncontrolled diabetes.  Patient does not wish to go to the ER today he also has some evidence of AKI with a creatinine of 1.3.  We will reach out to his PCP for further adjustments of his diabetes medication   Visit Diagnosis 1. Hepatocellular carcinoma ([place])      Dr. Randa Evens, MD, MPH [place] [facility] [phone] 12/23/2021 11:17 AM

## 2021-12-23 NOTE — Telephone Encounter (Signed)
Patient was called and an appt for 12/26/21 at 9:40am with Junie Panning was made

## 2021-12-23 NOTE — Addendum Note (Signed)
Addended by: Randa Evens C on: 12/23/2021 11:21 AM   Modules accepted: Orders

## 2021-12-23 NOTE — Telephone Encounter (Signed)
Johnny Kelley,  Please advise?   Recommended to go to ED for evaluation due to sx and elevated glucose. Patient reports he does not feel he needs to go to ED. Appt already scheduled with PCP for 01/10/22. Please advise .

## 2021-12-23 NOTE — Progress Notes (Signed)
Per pt he has fell about three times within the past month. Pt believes it is due to all the medications he is taking because dizziness is occuring frequently. Pt loses his balance lots.

## 2021-12-23 NOTE — Telephone Encounter (Signed)
Call received from [place] from [facility] [phone] to report glucose level 439 after labs obtained from [facility] today. Belenda Cruise reports patient was recommended to go to ED due to elevated glucose level. On 12/10/21 glucose level 390. Reports patient c/o dizziness and fell 3 weeks ago at night due to dizziness.

## 2021-12-23 NOTE — Telephone Encounter (Signed)
Called patient to review sx of elevated glucose reported from [facility] cancer center today .  Chief Complaint: elevated glucose reading from CMP today .  Symptoms: dizziness, headaches at times. Blurred vision  Frequency: na  Pertinent Negatives: Patient denies weakness on either side no rapid breathing reported.  Disposition: '[x]'$ ED /'[]'$ Urgent Care (no appt availability in office) / '[]'$ Appointment(In office/virtual)/ '[]'$  [place] [facility]/ '[]'$ Home Care/ '[]'$ Refused Recommended Disposition /'[]'$ [place] [facility]/ '[]'$  Follow-up with PCP Additional Notes:   Recommended to go to ED for evaluation due to sx and elevated glucose. Patient reports he does not feel he needs to go to ED. Appt already scheduled with PCP for 01/10/22. Please advise .      Reason for Disposition  Blood glucose > 400 mg/dL (22.2 mmol/L)  Answer Assessment - Initial Assessment Questions 1. BLOOD GLUCOSE: "What is your blood glucose level?"      Patient did not check. Glucose reading from CMP today at [place] 439. 2. ONSET: "When did you check the blood glucose?"     Today  3. USUAL RANGE: "What is your glucose level usually?" (e.g., usual fasting morning value, usual evening value)     na 4. KETONES: "Do you check for ketones (urine or blood test strips)?" If Yes, ask: "What does the test show now?"      na 5. TYPE 1 or 2:  "Do you know what type of diabetes you have?"  (e.g., Type 1, Type 2, Gestational; doesn't know)      na 6. INSULIN: "Do you take insulin?" "What type of insulin(s) do you use? What is the mode of delivery? (syringe, pen; injection or pump)?"      na 7. DIABETES PILLS: "Do you take any pills for your diabetes?" If Yes, ask: "Have you missed taking any pills recently?"     Yes stopped taking reports "made me dizzy" 8. OTHER SYMPTOMS: "Do you have any symptoms?" (e.g., fever, frequent urination, difficulty breathing, dizziness, weakness, vomiting)     Dizziness, headaches, decrease balance   9. PREGNANCY: "Is there any chance you are pregnant?" "When was your last menstrual period?"     na  Protocols used: Diabetes - High Blood Sugar-A-AH

## 2021-12-23 NOTE — Telephone Encounter (Signed)
Reached out to Dr. Levada Dy office at [phone] in regards to pts blood sugars. Per Dr. Janese Banks pts blood sugars have been very elevated and pt is refusing to go to ER. Spoke to [place] at [facility] who transferred me to nurse Angie to relay blood sugar information. Angie took all necessary information and will reach out to the pt regarding any suggestions from PCP.

## 2021-12-24 ENCOUNTER — Other Ambulatory Visit: Payer: Self-pay

## 2021-12-24 ENCOUNTER — Emergency Department
Admission: EM | Admit: 2021-12-24 | Discharge: 2021-12-24 | Discharge status: Against Medical Advice | Payer: Medicaid Other | Attending: Emergency Medicine | Admitting: Emergency Medicine

## 2021-12-24 ENCOUNTER — Encounter: Payer: Self-pay | Admitting: Emergency Medicine

## 2021-12-24 ENCOUNTER — Ambulatory Visit
Admission: RE | Admit: 2021-12-24 | Discharge: 2021-12-24 | Disposition: A | Discharge status: Home / Self Care | Payer: Medicaid Other | Source: Ambulatory Visit | Attending: Nurse Practitioner | Admitting: Nurse Practitioner

## 2021-12-24 ENCOUNTER — Encounter: Payer: Self-pay | Admitting: Nurse Practitioner

## 2021-12-24 DIAGNOSIS — I6782 Cerebral ischemia: Secondary | ICD-10-CM | POA: Diagnosis not present

## 2021-12-24 DIAGNOSIS — R200 Anesthesia of skin: Secondary | ICD-10-CM | POA: Insufficient documentation

## 2021-12-24 DIAGNOSIS — W19XXXA Unspecified fall, initial encounter: Secondary | ICD-10-CM

## 2021-12-24 DIAGNOSIS — E1165 Type 2 diabetes mellitus with hyperglycemia: Secondary | ICD-10-CM | POA: Diagnosis not present

## 2021-12-24 DIAGNOSIS — R739 Hyperglycemia, unspecified: Secondary | ICD-10-CM | POA: Insufficient documentation

## 2021-12-24 DIAGNOSIS — Z8673 Personal history of transient ischemic attack (TIA), and cerebral infarction without residual deficits: Secondary | ICD-10-CM | POA: Insufficient documentation

## 2021-12-24 DIAGNOSIS — Z5321 Procedure and treatment not carried out due to patient leaving prior to being seen by health care provider: Secondary | ICD-10-CM | POA: Insufficient documentation

## 2021-12-24 DIAGNOSIS — R42 Dizziness and giddiness: Secondary | ICD-10-CM | POA: Diagnosis not present

## 2021-12-24 DIAGNOSIS — J320 Chronic maxillary sinusitis: Secondary | ICD-10-CM | POA: Diagnosis not present

## 2021-12-24 DIAGNOSIS — G319 Degenerative disease of nervous system, unspecified: Secondary | ICD-10-CM | POA: Insufficient documentation

## 2021-12-24 LAB — CBC WITH DIFFERENTIAL/PLATELET
Abs Immature Granulocytes: 0.01 10*3/uL (ref 0.00–0.07)
Basophils Absolute: 0 10*3/uL (ref 0.0–0.1)
Basophils Relative: 1 %
Eosinophils Absolute: 0.1 10*3/uL (ref 0.0–0.5)
Eosinophils Relative: 2 %
HCT: 44.1 % (ref 39.0–52.0)
Hemoglobin: 15.4 g/dL (ref 13.0–17.0)
Immature Granulocytes: 0 %
Lymphocytes Relative: 28 %
Lymphs Abs: 1.6 10*3/uL (ref 0.7–4.0)
MCH: 34.6 pg — ABNORMAL HIGH (ref 26.0–34.0)
MCHC: 34.9 g/dL (ref 30.0–36.0)
MCV: 99.1 fL (ref 80.0–100.0)
Monocytes Absolute: 0.7 10*3/uL (ref 0.1–1.0)
Monocytes Relative: 12 %
Neutro Abs: 3.3 10*3/uL (ref 1.7–7.7)
Neutrophils Relative %: 57 %
Platelets: 87 10*3/uL — ABNORMAL LOW (ref 150–400)
RBC: 4.45 MIL/uL (ref 4.22–5.81)
RDW: 13.4 % (ref 11.5–15.5)
WBC: 5.7 10*3/uL (ref 4.0–10.5)
nRBC: 0 % (ref 0.0–0.2)

## 2021-12-24 LAB — COMPREHENSIVE METABOLIC PANEL
ALT: 68 U/L — ABNORMAL HIGH (ref 0–44)
AST: 110 U/L — ABNORMAL HIGH (ref 15–41)
Albumin: 3.5 g/dL (ref 3.5–5.0)
Alkaline Phosphatase: 232 U/L — ABNORMAL HIGH (ref 38–126)
Anion gap: 13 (ref 5–15)
BUN: 9 mg/dL (ref 6–20)
CO2: 28 mmol/L (ref 22–32)
Calcium: 10 mg/dL (ref 8.9–10.3)
Chloride: 90 mmol/L — ABNORMAL LOW (ref 98–111)
Creatinine, Ser: 1.03 mg/dL (ref 0.61–1.24)
GFR, Estimated: >60 mL/min (ref >60)
Glucose, Bld: 400 mg/dL — ABNORMAL HIGH (ref 70–99)
Potassium: 3.8 mmol/L (ref 3.5–5.1)
Sodium: 131 mmol/L — ABNORMAL LOW (ref 135–145)
Total Bilirubin: 3.2 mg/dL — ABNORMAL HIGH (ref 0.3–1.2)
Total Protein: 8.1 g/dL (ref 6.5–8.1)

## 2021-12-24 LAB — URINALYSIS, ROUTINE W REFLEX MICROSCOPIC
Bilirubin Urine: NEGATIVE
Glucose, UA: >=500 mg/dL — AB
Hgb urine dipstick: NEGATIVE
Ketones, ur: NEGATIVE mg/dL
Nitrite: NEGATIVE
Protein, ur: NEGATIVE mg/dL
Specific Gravity, Urine: 1.03 (ref 1.005–1.030)
pH: 6 (ref 5.0–8.0)

## 2021-12-24 LAB — CBG MONITORING, ED: Glucose-Capillary: 411 mg/dL — ABNORMAL HIGH (ref 70–99)

## 2021-12-24 NOTE — ED Triage Notes (Signed)
Pt was seen at cancer center for blood work and glucose was 430 pt as advised to come yesterday but went home to try to get it down on his own. Pt has been dizzy with blurred vision for 3 weeks.

## 2021-12-24 NOTE — ED Provider Triage Note (Signed)
  Emergency Medicine Provider Triage Evaluation Note  Johnny Kelley , a 56 y.o.male,  was evaluated in triage.  Pt complains of dizziness and trembling in his right arm over the past few days.  States that he recently had a fall while going to the bathroom in which he hit his head and his right side of his chest.  He had x-ray performed today, which showed no rib fractures.  He also had a head CT earlier today, which they have not been told the results.  Also states that he has had elevated blood sugar in the 400s.  Not endorsing any significant pain at this time.   Review of Systems  Positive: Dizziness, right arm trembling, hyperglycemia Negative: Denies fever, chest pain, vomiting  Physical Exam  There were no vitals filed for this visit. Gen:   Awake, no distress   Resp:  Normal effort  MSK:   Moves extremities without difficulty  Other:    Medical Decision Making  Given the patient's initial medical screening exam, the following diagnostic evaluation has been ordered. The patient will be placed in the appropriate treatment space, once one is available, to complete the evaluation and treatment. I have discussed the plan of care with the patient and I have advised the patient that an ED physician or mid-level practitioner will reevaluate their condition after the test results have been received, as the results may give them additional insight into the type of treatment they may need.    Diagnostics: Labs, UA  Treatments: none immediately   Teodoro Spray, [place] 12/24/21 1425

## 2021-12-26 ENCOUNTER — Encounter: Payer: Self-pay | Admitting: Physician Assistant

## 2021-12-26 ENCOUNTER — Encounter: Payer: Self-pay | Admitting: Nurse Practitioner

## 2021-12-26 ENCOUNTER — Ambulatory Visit (INDEPENDENT_AMBULATORY_CARE_PROVIDER_SITE_OTHER): Payer: Medicaid Other | Admitting: Physician Assistant

## 2021-12-26 ENCOUNTER — Other Ambulatory Visit: Payer: Medicaid Other

## 2021-12-26 VITALS — BP 130/81 | HR 75 | Temp 98.0°F | Ht 74.02 in | Wt 197.3 lb

## 2021-12-26 DIAGNOSIS — G8929 Other chronic pain: Secondary | ICD-10-CM

## 2021-12-26 DIAGNOSIS — E1140 Type 2 diabetes mellitus with diabetic neuropathy, unspecified: Secondary | ICD-10-CM | POA: Diagnosis not present

## 2021-12-26 DIAGNOSIS — M545 Low back pain, unspecified: Secondary | ICD-10-CM | POA: Diagnosis not present

## 2021-12-26 LAB — AFP TUMOR MARKER: AFP, Serum, Tumor Marker: 3.6 ng/mL (ref 0.0–8.4)

## 2021-12-26 MED ORDER — TIRZEPATIDE 2.5 MG/0.5ML ~~LOC~~ SOAJ: 2.5000 mg | SUBCUTANEOUS | 2 refills | Status: DC

## 2021-12-26 NOTE — Progress Notes (Signed)
Tumor Board Documentation  Johnny Kelley was presented by Dr Janese Banks at our Tumor Board on 12/26/2021, which included representatives from medical oncology, radiology, pathology, surgical, pharmacy, pulmonology, radiation oncology, navigation, research, internal medicine, palliative care.  Johnny Kelley currently presents as a current patient, for discussion with history of the following treatments:  (Ablation).  Additionally, we reviewed previous medical and familial history, history of present illness, and recent lab results along with all available histopathologic and imaging studies. The tumor board considered available treatment options and made the following recommendations: Active surveillance Possibly ablate lesion  The following procedures/referrals were also placed: No orders of the defined types were placed in this encounter.   Clinical Trial Status: not discussed   Staging used: Clinical Stage AJCC Staging: T: c1b N: 0 M: 0 Group: Stage I B Hepatocellular Carcinoma   National site-specific guidelines NCCN were discussed with respect to the case.  Tumor board is a meeting of clinicians from various specialty areas who evaluate and discuss patients for whom a multidisciplinary approach is being considered. Final determinations in the plan of care are those of the provider(s). The responsibility for follow up of recommendations given during tumor board is that of the provider.   Today's extended care, comprehensive team conference, Johnny Kelley was not present for the discussion and was not examined.   Multidisciplinary Tumor Board is a multidisciplinary case peer review process.  Decisions discussed in the Multidisciplinary Tumor Board reflect the opinions of the specialists present at the conference without having examined the patient.  Ultimately, treatment and diagnostic decisions rest with the primary provider(s) and the patient.

## 2021-12-26 NOTE — Progress Notes (Signed)
Established Patient Office Visit  Name: Johnny Kelley   MRN: [id number]    DOB: 06-04-1965   Date:12/26/2021  Today's Provider: Talitha Givens, MHS, PA-C Introduced myself to the patient as a PA-C and provided education on APPs in clinical practice.         Subjective  Chief Complaint  Chief Complaint  Patient presents with   Diabetes    Blood sugars have been running high.    Fall    Fell 3 weeks ago.     HPI  Diabetes, Type 2 - Last A1c 9.6 - Medications:  - Compliance: States he has not been very compliant lately because he thinks medications are making him nauseous and vomit. States he feels nauseous and vomits for almost 3 days after taking Januvia so he has not been taking regularly Reports he would like to start insulin due to these concerns.  - Checking BG at home: Was not checking regularly for almost a year. Reports this AM it was 309   - Eye exam: Up to date. Due next year  - Foot exam: Due next year  - Microalbumin: Up to date - Statin: not on statin - PNA vaccine:  - Denies symptoms of hypoglycemia, polyuria, polydipsia, numbness extremities, foot ulcers/trauma    Patient Active Problem List   Diagnosis Date Noted   Cerebral atrophy ([place]) 12/24/2021   Rib pain on right side 12/10/2021   Fall 12/10/2021   Nicotine dependence, cigarettes, w unsp disorders 12/10/2021   Type 2 diabetes mellitus with diabetic neuropathy, without long-term current use of insulin ([place]) 06/20/2021   Hypertension associated with diabetes ([place]) 05/23/2021   Goals of care, counseling/discussion 01/22/2020   Hepatocellular carcinoma ([place]) 01/22/2020   Chronic low back pain 12/01/2019   Psoriasis 11/10/2019   Hyperlipidemia associated with type 2 diabetes mellitus ([place]) 10/27/2019   Alcohol abuse 10/27/2019   History of CVA (cerebrovascular accident) 09/24/2018    Past Surgical History:  Procedure Laterality Date   IR RADIOLOGIST EVAL & MGMT  01/19/2020   IR  RADIOLOGIST EVAL & MGMT  01/31/2020   IR RADIOLOGIST EVAL & MGMT  04/12/2020   IR RADIOLOGIST EVAL & MGMT  08/30/2020   KNEE SURGERY Right     Family History  Problem Relation Age of Onset   Diabetes Mother    Colon cancer Father    Diabetes Father    Kidney disease Maternal Grandmother    Lung cancer Maternal Grandfather    Hyperlipidemia Paternal Grandmother    COPD Paternal Grandfather     Social History   Tobacco Use   Smoking status: Every Day    Packs/day: 0.10    Years: 38.00    Total pack years: 3.80    Types: Cigarettes   Smokeless tobacco: Never  Substance Use Topics   Alcohol use: Yes    Alcohol/week: 21.0 standard drinks of alcohol    Types: 21 Cans of beer per week    Comment: 1 beer a day     Current Outpatient Medications:    blood glucose meter kit and supplies KIT, Dispense based on patient and insurance preference. Use up to four times daily as directed. (FOR ICD-9 250.00, 250.01)., Disp: 1 each, Rfl: 0   clopidogrel (PLAVIX) 75 MG tablet, clopidogrel 75 mg tablet  TAKE 1 TABLET BY MOUTH EVERY DAY, Disp: , Rfl:    clotrimazole (LOTRIMIN) 1 % cream, clotrimazole 1 % topical cream, Disp: , Rfl:  empagliflozin (JARDIANCE) 25 MG TABS tablet, Take 1 tablet (25 mg total) by mouth daily., Disp: 90 tablet, Rfl: 5   famotidine (PEPCID) 20 MG tablet, TAKE 1 TABLET BY MOUTH EVERYDAY AT BEDTIME, Disp: 90 tablet, Rfl: 1   gabapentin (NEURONTIN) 100 MG capsule, Take 1 capsule (100 mg total) by mouth daily., Disp: 90 capsule, Rfl: 3   lidocaine (LIDODERM) 5 %, Place 1 patch onto the skin daily. Remove & Discard patch within 12 hours or as directed by MD, Disp: 30 patch, Rfl: 3   losartan (COZAAR) 100 MG tablet, Take 1 tablet (100 mg total) by mouth daily., Disp: 30 tablet, Rfl: 4   meloxicam (MOBIC) 7.5 MG tablet, Take 7.5 mg by mouth daily., Disp: , Rfl:    metFORMIN (GLUCOPHAGE) 1000 MG tablet, Take 1 tablet (1,000 mg total) by mouth daily., Disp: 90 tablet, Rfl: 1    metoprolol succinate (TOPROL XL) 25 MG 24 hr tablet, Take 1 tablet (25 mg total) by mouth daily., Disp: 30 tablet, Rfl: 5   solifenacin (VESICARE) 10 MG tablet, solifenacin 10 mg tablet  TAKE 1 TABLET (10 MG TOTAL) BY MOUTH DAILY. MAY CAUSE CONSTIPATION/DRY MOUTH., Disp: , Rfl:    tamsulosin (FLOMAX) 0.4 MG CAPS capsule, Take 1 capsule (0.4 mg total) by mouth daily., Disp: 30 capsule, Rfl: 11   terbinafine (LAMISIL) 250 MG tablet, Take 1 tablet (250 mg total) by mouth daily., Disp: 30 tablet, Rfl: 0   tirzepatide (MOUNJARO) 2.5 MG/0.5ML Pen, Inject 2.5 mg into the skin once a week., Disp: 2 mL, Rfl: 2   tiZANidine (ZANAFLEX) 4 MG tablet, Take 4 mg by mouth every 6 (six) hours as needed., Disp: , Rfl:    fluorouracil (EFUDEX) 5 % cream, Apply topically 2 (two) times daily. On November 29, 2021 start cream bid for 7 days to forehead, temples and nose (Patient not taking: Reported on 12/26/2021), Disp: 15 g, Rfl: 1   hydrochlorothiazide (HYDRODIURIL) 25 MG tablet, Take 1 tablet (25 mg total) by mouth daily., Disp: 90 tablet, Rfl: 3   TOVIAZ 8 MG TB24 tablet, Take 8 mg by mouth daily. (Patient not taking: Reported on 12/26/2021), Disp: , Rfl:   No Known Allergies  I personally reviewed active problem list, medication list, allergies, health maintenance, notes from last encounter, lab results with the patient/caregiver today.   Review of Systems  Gastrointestinal:  Positive for nausea and vomiting.  Neurological:  Positive for dizziness.      Objective  Vitals:   12/26/21 0951  BP: 130/81  Pulse: 75  Temp: 98 F (36.7 C)  TempSrc: Oral  SpO2: 96%  Weight: 197 lb 4.8 oz (89.5 kg)  Height: 6' 2.02" (1.88 m)    Body mass index is 25.32 kg/m.  Physical Exam Vitals reviewed.  Constitutional:      General: He is awake.     Appearance: Normal appearance. He is well-developed, well-groomed and normal weight.  HENT:     Head: Normocephalic and atraumatic.  Eyes:     General: Lids are  normal. Gaze aligned appropriately.  Pulmonary:     Effort: Pulmonary effort is normal.  Musculoskeletal:     Cervical back: Normal range of motion.  Neurological:     Mental Status: He is alert and oriented to person, place, and time.     GCS: GCS eye subscore is 4. GCS verbal subscore is 5. GCS motor subscore is 6.     Cranial Nerves: No dysarthria or facial asymmetry.  Psychiatric:  Attention and Perception: Attention and perception normal.        Mood and Affect: Affect normal. Mood is anxious.        Speech: Speech normal.        Behavior: Behavior normal. Behavior is cooperative.        Thought Content: Thought content normal.      Recent Results (from the past 2160 hour(s))  Bayer DCA Hb A1c Waived     Status: Abnormal   Collection Time: 12/10/21  3:58 PM  Result Value Ref Range   HB A1C (BAYER DCA - WAIVED) 9.6 (H) 4.8 - 5.6 %    Comment:          Prediabetes: 5.7 - 6.4          Diabetes: >6.4          Glycemic control for adults with diabetes: <7.0   Microalbumin, Urine Waived     Status: None   Collection Time: 12/10/21  3:58 PM  Result Value Ref Range   Microalb, Ur Waived 10 0 - 19 mg/L   Creatinine, Urine Waived 50 10 - 300 mg/dL   Microalb/Creat Ratio <30 <30 mg/g    Comment:                              Abnormal:       30 - 300                         High Abnormal:           >300   Comprehensive metabolic panel     Status: Abnormal   Collection Time: 12/10/21  4:01 PM  Result Value Ref Range   Glucose 390 (H) 70 - 99 mg/dL   BUN 8 6 - 24 mg/dL   Creatinine, Ser 0.86 0.76 - 1.27 mg/dL   eGFR 100 >59 mL/min/1.73   BUN/Creatinine Ratio 9 9 - 20   Sodium 129 (L) 134 - 144 mmol/L   Potassium 4.1 3.5 - 5.2 mmol/L   Chloride 95 (L) 96 - 106 mmol/L   CO2 22 20 - 29 mmol/L   Calcium 9.3 8.7 - 10.2 mg/dL   Total Protein 6.9 6.0 - 8.5 g/dL   Albumin 3.3 (L) 3.8 - 4.9 g/dL   Globulin, Total 3.6 1.5 - 4.5 g/dL   Albumin/Globulin Ratio 0.9 (L) 1.2 - 2.2    Bilirubin Total 3.4 (H) 0.0 - 1.2 mg/dL   Alkaline Phosphatase 222 (H) 44 - 121 IU/L   AST 96 (H) 0 - 40 IU/L   ALT 77 (H) 0 - 44 IU/L  Lipid Panel w/o Chol/HDL Ratio     Status: None   Collection Time: 12/10/21  4:01 PM  Result Value Ref Range   Cholesterol, Total 121 100 - 199 mg/dL   Triglycerides 86 0 - 149 mg/dL   HDL 60 >39 mg/dL   VLDL Cholesterol Cal 17 5 - 40 mg/dL   LDL Chol Calc (NIH) 44 0 - 99 mg/dL  TSH     Status: None   Collection Time: 12/10/21  4:01 PM  Result Value Ref Range   TSH 1.700 0.450 - 4.500 uIU/mL  Comprehensive metabolic panel     Status: Abnormal   Collection Time: 12/23/21  9:35 AM  Result Value Ref Range   Sodium 128 (L) 135 - 145 mmol/L   Potassium 3.8 3.5 -  5.1 mmol/L   Chloride 88 (L) 98 - 111 mmol/L   CO2 30 22 - 32 mmol/L   Glucose, Bld 439 (H) 70 - 99 mg/dL    Comment: Glucose reference range applies only to samples taken after fasting for at least 8 hours.   BUN 14 6 - 20 mg/dL   Creatinine, Ser 1.32 (H) 0.61 - 1.24 mg/dL   Calcium 8.9 8.9 - 10.3 mg/dL   Total Protein 7.2 6.5 - 8.1 g/dL   Albumin 3.1 (L) 3.5 - 5.0 g/dL   AST 66 (H) 15 - 41 U/L   ALT 47 (H) 0 - 44 U/L   Alkaline Phosphatase 151 (H) 38 - 126 U/L   Total Bilirubin 2.9 (H) 0.3 - 1.2 mg/dL   GFR, Estimated >60 >60 mL/min    Comment: (NOTE) Calculated using the CKD-EPI Creatinine Equation (2021)    Anion gap 10 5 - 15    Comment: Performed at [facility], [address] [place], [place] [address]  AFP tumor marker     Status: None   Collection Time: 12/23/21  9:35 AM  Result Value Ref Range   AFP, Serum, Tumor Marker 3.6 0.0 - 8.4 ng/mL    Comment: (NOTE) Roche [employer]ostics Electrochemiluminescence Immunoassay (ECLIA) Values obtained with different assay methods or kits cannot be used interchangeably.  Results cannot be interpreted as absolute evidence of the presence or absence of malignant disease. This test is not interpretable in pregnant  females. Performed At: [facility] [place], [place] [id number] Rush Farmer MD [id number]   Vitamin B12     Status: Abnormal   Collection Time: 12/23/21  9:35 AM  Result Value Ref Range   Vitamin B-12 1,009 (H) 180 - 914 pg/mL    Comment: (NOTE) This assay is not validated for testing neonatal or myeloproliferative syndrome specimens for Vitamin B12 levels. Performed at [facility], [place] [address], [place], [place] [address]   Iron and TIBC([facility]/Sunquest)     Status: Abnormal   Collection Time: 12/23/21  9:35 AM  Result Value Ref Range   Iron 81 45 - 182 ug/dL   TIBC 242 (L) 250 - 450 ug/dL   Saturation Ratios 33 17.9 - 39.5 %   UIBC 161 ug/dL    Comment: Performed at [facility], [address] [place], [place] [address]  Ferritin     Status: Abnormal   Collection Time: 12/23/21  9:35 AM  Result Value Ref Range   Ferritin 407 (H) 24 - 336 ng/mL    Comment: Performed at [facility], [address] [place], [place] [address]  CBC with Differential     Status: Abnormal   Collection Time: 12/23/21  9:35 AM  Result Value Ref Range   WBC 7.0 4.0 - 10.5 K/uL   RBC 4.15 (L) 4.22 - 5.81 MIL/uL   Hemoglobin 14.6 13.0 - 17.0 g/dL   HCT 41.3 39.0 - 52.0 %   MCV 99.5 80.0 - 100.0 fL   MCH 35.2 (H) 26.0 - 34.0 pg   MCHC 35.4 30.0 - 36.0 g/dL   RDW 13.8 11.5 - 15.5 %   Platelets 80 (L) 150 - 400 K/uL   nRBC 0.0 0.0 - 0.2 %   Neutrophils Relative % 67 %   Neutro Abs 4.6 1.7 - 7.7 K/uL   Lymphocytes Relative 22 %   Lymphs Abs 1.6 0.7 - 4.0 K/uL   Monocytes Relative 11 %   Monocytes  Absolute 0.8 0.1 - 1.0 K/uL   Eosinophils Relative 0 %   Eosinophils Absolute 0.0 0.0 - 0.5 K/uL   Basophils Relative 0 %   Basophils Absolute 0.0 0.0 - 0.1 K/uL   Immature Granulocytes 0 %   Abs Immature Granulocytes 0.03 0.00 - 0.07 K/uL    Comment: Performed at [facility], [address] [place], [place] [address]  POC CBG, ED      Status: Abnormal   Collection Time: 12/24/21  2:46 PM  Result Value Ref Range   Glucose-Capillary 411 (H) 70 - 99 mg/dL    Comment: Glucose reference range applies only to samples taken after fasting for at least 8 hours.  CBC with Differential     Status: Abnormal   Collection Time: 12/24/21  2:59 PM  Result Value Ref Range   WBC 5.7 4.0 - 10.5 K/uL   RBC 4.45 4.22 - 5.81 MIL/uL   Hemoglobin 15.4 13.0 - 17.0 g/dL   HCT 44.1 39.0 - 52.0 %   MCV 99.1 80.0 - 100.0 fL   MCH 34.6 (H) 26.0 - 34.0 pg   MCHC 34.9 30.0 - 36.0 g/dL   RDW 13.4 11.5 - 15.5 %   Platelets 87 (L) 150 - 400 K/uL    Comment: Immature Platelet Fraction may be clinically indicated, consider ordering this additional test [id number]    nRBC 0.0 0.0 - 0.2 %   Neutrophils Relative % 57 %   Neutro Abs 3.3 1.7 - 7.7 K/uL   Lymphocytes Relative 28 %   Lymphs Abs 1.6 0.7 - 4.0 K/uL   Monocytes Relative 12 %   Monocytes Absolute 0.7 0.1 - 1.0 K/uL   Eosinophils Relative 2 %   Eosinophils Absolute 0.1 0.0 - 0.5 K/uL   Basophils Relative 1 %   Basophils Absolute 0.0 0.0 - 0.1 K/uL   Immature Granulocytes 0 %   Abs Immature Granulocytes 0.01 0.00 - 0.07 K/uL    Comment: Performed at [facility], [address] [place], [place] [address]  Comprehensive metabolic panel     Status: Abnormal   Collection Time: 12/24/21  2:59 PM  Result Value Ref Range   Sodium 131 (L) 135 - 145 mmol/L   Potassium 3.8 3.5 - 5.1 mmol/L   Chloride 90 (L) 98 - 111 mmol/L   CO2 28 22 - 32 mmol/L   Glucose, Bld 400 (H) 70 - 99 mg/dL    Comment: Glucose reference range applies only to samples taken after fasting for at least 8 hours.   BUN 9 6 - 20 mg/dL   Creatinine, Ser 1.03 0.61 - 1.24 mg/dL   Calcium 10.0 8.9 - 10.3 mg/dL   Total Protein 8.1 6.5 - 8.1 g/dL   Albumin 3.5 3.5 - 5.0 g/dL   AST 110 (H) 15 - 41 U/L   ALT 68 (H) 0 - 44 U/L   Alkaline Phosphatase 232 (H) 38 - 126 U/L   Total Bilirubin 3.2 (H) 0.3 - 1.2 mg/dL    GFR, Estimated >60 >60 mL/min    Comment: (NOTE) Calculated using the CKD-EPI Creatinine Equation (2021)    Anion gap 13 5 - 15    Comment: Performed at [facility], [address] [place], [place] [address]  Urinalysis, Routine w reflex microscopic Urine, Clean Catch     Status: Abnormal   Collection Time: 12/24/21  2:59 PM  Result Value Ref Range   Color, Urine YELLOW (A) YELLOW   APPearance  CLEAR (A) CLEAR   Specific Gravity, Urine 1.030 1.005 - 1.030   pH 6.0 5.0 - 8.0   Glucose, UA >=500 (A) NEGATIVE mg/dL   Hgb urine dipstick NEGATIVE NEGATIVE   Bilirubin Urine NEGATIVE NEGATIVE   Ketones, ur NEGATIVE NEGATIVE mg/dL   Protein, ur NEGATIVE NEGATIVE mg/dL   Nitrite NEGATIVE NEGATIVE   Leukocytes,Ua TRACE (A) NEGATIVE   RBC / HPF 0-5 0 - 5 RBC/hpf   WBC, UA 0-5 0 - 5 WBC/hpf   Bacteria, UA RARE (A) NONE SEEN   Squamous Epithelial / LPF 0-5 0 - 5   Mucus PRESENT     Comment: Performed at [facility], [address] [place], [place] [address]     PHQ2/9:    12/26/2021   11:15 AM 12/10/2021    3:21 PM 09/24/2021   10:10 AM 06/20/2021    9:50 AM 05/23/2021    9:32 AM  Depression screen PHQ 2/9  Decreased Interest 2 0 0 0 0  Down, Depressed, Hopeless 1 0 0 0 0  PHQ - 2 Score 3 0 0 0 0  Altered sleeping  0 0 0 0  Tired, decreased energy _0 Change in appetite 0 0 1 0 0  Feeling bad or failure about yourself  0 0 0 0 0  Trouble concentrating 0 0 0 0 0  Moving slowly or fidgety/restless 0 0 0 0 0  Suicidal thoughts 0 0 0 0 0  PHQ-9 Score  _1 Difficult doing work/chores Somewhat difficult Somewhat difficult Somewhat difficult Not difficult at all Not difficult at all      Fall Risk:    12/26/2021   11:15 AM 09/24/2021   10:10 AM 06/20/2021    9:50 AM 05/23/2021    9:32 AM 05/08/2021    9:33 AM  Fall Risk   Falls in the past year? 1 0 0 0 0  Number falls in past yr: 1 0 0 0 0  Injury with Fall? 1 0 0 0 0  Risk for fall due to :  Impaired balance/gait No Fall Risks No Fall Risks No Fall Risks No Fall Risks  Follow up _2       Functional Status Survey:      Assessment & Plan  Problem List Items Addressed This Visit       Endocrine   Type 2 diabetes mellitus with diabetic neuropathy, without long-term current use of insulin ([place]) - Primary    Chronic, ongoing condition Currently exacerbated as A1c has increased to 9.6  He was started on Januvia but reports nausea, vomiting when he takes this. He requested to be placed on insulin but we discussed other injectables He is amenable to trying mounjaro today- will start 2.5 mg IM once per week  Will dc Januvia due to GI upset.  Recommend he continue to take Metformin and Jardiance at current doses Recommend he check glucoses daily to monitor response to [facility] injections Follow up in 3 months to recheck A1c and assess response to new medication barring concerns or issues with medications.       Relevant Medications   tirzepatide [facility]) 2.5 MG/0.5ML Pen     Other   Chronic low back pain    Discussed today with patient Reports he has chronic back pain with radiculopathy  Recommend trying Gabapentin 100 mg PO BID rather than  once per day to see if this improves symptoms He is seeing specialist ([facility]?) for this and is involved with PT  Follow up as needed         Return in about 3 months (around 03/27/2022) for Diabetes follow up, newly added Mounjaro.   I,  E , PA-C, have reviewed all documentation for this visit. The documentation on 12/26/21 for the exam, diagnosis, procedures, and orders are all accurate and complete.   Talitha Givens, MHS, PA-C [facility]

## 2021-12-26 NOTE — Assessment & Plan Note (Signed)
Chronic, ongoing condition Currently exacerbated as A1c has increased to 9.6  He was started on Januvia but reports nausea, vomiting when he takes this. He requested to be placed on insulin but we discussed other injectables He is amenable to trying mounjaro today- will start 2.5 mg IM once per week  Will dc Januvia due to GI upset.  Recommend he continue to take Metformin and Jardiance at current doses Recommend he check glucoses daily to monitor response to [facility] injections Follow up in 3 months to recheck A1c and assess response to new medication barring concerns or issues with medications.

## 2021-12-26 NOTE — Patient Instructions (Addendum)
Try taking one gabapentin in the morning and one at night to see if this helps with your back pain.   Please stop taking the Januvia  Continue to take the Metformin and Jardiance daily  We are adding a medication called Mounjaro to your regimen. This is an injection that you take once per week.   Keep monitoring your blood sugars daily to see if they are responding to the new medication.   Let us know if you have any concerns or questions.

## 2021-12-26 NOTE — Assessment & Plan Note (Addendum)
Discussed today with patient Reports he has chronic back pain with radiculopathy  Recommend trying Gabapentin 100 mg PO BID rather than once per day to see if this improves symptoms He is seeing specialist ([facility]?) for this and is involved with PT  Follow up as needed

## 2021-12-26 NOTE — Progress Notes (Signed)
Attempted to call x 3 with no answer over past days -- please attempt to call with below results and if unable to get through then may send letter I have in chart and printed: Overall imaging does not show new stroke, but does show old areas.  There is some atrophy (getting smaller) of areas of brain, for this we need to monitor memory closely as over time this could lead to memory changes.  A couple of your sinus areas are showing severe inflammation, have you need sick (sinus congestion, headache) if so please reach out to me and let me know as we may send in antibiotic for this.  Any questions?

## 2021-12-27 ENCOUNTER — Telehealth: Payer: Self-pay | Admitting: Internal Medicine

## 2021-12-27 ENCOUNTER — Other Ambulatory Visit: Payer: Self-pay | Admitting: Physician Assistant

## 2021-12-27 DIAGNOSIS — E1140 Type 2 diabetes mellitus with diabetic neuropathy, unspecified: Secondary | ICD-10-CM

## 2021-12-27 NOTE — Telephone Encounter (Signed)
Prescription for new glucose meter and strips have been faxed to pharmacy

## 2021-12-27 NOTE — Telephone Encounter (Signed)
  Notes to clinic:  Pharm request as follows: Alternative Requested:NOT COVERED OR PRIOR AUTHORIZATION.      Requested Prescriptions  Pending Prescriptions Disp Refills   OZEMPIC, 0.25 OR 0.5 MG/DOSE, 2 MG/3ML SOPN [Pharmacy Med Name: OZEMPIC 0.25-0.5 MG/DOSE PEN]  0     Endocrinology:  Diabetes - GLP-1 Receptor Agonists - semaglutide Failed - 12/27/2021 11:57 AM      Failed - HBA1C in normal range and within 180 days    HB A1C (BAYER DCA - WAIVED)  Date Value Ref Range Status  12/10/2021 9.6 (H) 4.8 - 5.6 % Final    Comment:             Prediabetes: 5.7 - 6.4          Diabetes: >6.4          Glycemic control for adults with diabetes: <7.0          Passed - Cr in normal range and within 360 days    Creatinine, Ser  Date Value Ref Range Status  12/24/2021 1.03 0.61 - 1.24 mg/dL Final         Passed - Valid encounter within last 6 months    Recent Outpatient Visits           Yesterday Type 2 diabetes mellitus with diabetic neuropathy, without long-term current use of insulin ([place])   [facility] Mecum, Erin E, PA-C   2 weeks ago Type 2 diabetes mellitus with diabetic neuropathy, without long-term current use of insulin ([place])   [place], [place] T, NP   3 months ago Irregular heart rate   [facility] Kathrine Haddock, NP   6 months ago Vision changes   [place] Vigg, Avanti, MD   7 months ago Essential hypertension   [place]la Vigg, Customer service manager, MD       Future Appointments             In 2 weeks Venita Lick, NP [facility]   In 1 month Ralene Bathe, MD [place]   In 2 months Kathrine Haddock, NP [facility], [place]   In 6 months Mecum, Dani Gobble, PA-C [employer]

## 2021-12-27 NOTE — Telephone Encounter (Signed)
PT's brother was inquiring to see if he could get clearance to start his physical therapy back, he dropped a paper off.  I didn't think you would need it but I told him I would send it back anyway.  His physical therapist is Glenna Fellows.  I think the paper was just for informational purposes.  Put in provider's folder.

## 2021-12-27 NOTE — Telephone Encounter (Signed)
Pt's brother walked in and asked if a prescription for a new glucose meter and test strips can be sent in because the one that they have is broken. Patient was just seen on 12/26/2021 for blood sugar.

## 2021-12-30 NOTE — Telephone Encounter (Signed)
PA started for [facility] 2.'5mg'$ , waiting on determination

## 2021-12-31 ENCOUNTER — Telehealth: Payer: Self-pay

## 2021-12-31 NOTE — Telephone Encounter (Signed)
thanks

## 2021-12-31 NOTE — Telephone Encounter (Signed)
Per Dr. Solmon Ice he will schedule the repeat TACE.

## 2022-01-02 DIAGNOSIS — M5416 Radiculopathy, lumbar region: Secondary | ICD-10-CM | POA: Diagnosis not present

## 2022-01-02 DIAGNOSIS — M545 Low back pain, unspecified: Secondary | ICD-10-CM | POA: Diagnosis not present

## 2022-01-02 DIAGNOSIS — M47896 Other spondylosis, lumbar region: Secondary | ICD-10-CM | POA: Diagnosis not present

## 2022-01-02 DIAGNOSIS — M48062 Spinal stenosis, lumbar region with neurogenic claudication: Secondary | ICD-10-CM | POA: Diagnosis not present

## 2022-01-02 DIAGNOSIS — E119 Type 2 diabetes mellitus without complications: Secondary | ICD-10-CM | POA: Diagnosis not present

## 2022-01-03 DIAGNOSIS — M545 Low back pain, unspecified: Secondary | ICD-10-CM | POA: Diagnosis not present

## 2022-01-03 DIAGNOSIS — M2560 Stiffness of unspecified joint, not elsewhere classified: Secondary | ICD-10-CM | POA: Diagnosis not present

## 2022-01-05 NOTE — Patient Instructions (Signed)
Diabetes Mellitus Basics  Diabetes mellitus, or diabetes, is a long-term (chronic) disease. It occurs when the body does not properly use sugar (glucose) that is released from food after you eat. Diabetes mellitus may be caused by one or both of these problems: Your pancreas does not make enough of a hormone called insulin. Your body does not react in a normal way to the insulin that it makes. Insulin lets glucose enter cells in your body. This gives you energy. If you have diabetes, glucose cannot get into cells. This causes high blood glucose (hyperglycemia). How to treat and manage diabetes You may need to take insulin or other diabetes medicines daily to keep your glucose in balance. If you are prescribed insulin, you will learn how to give yourself insulin by injection. You may need to adjust the amount of insulin you take based on the foods that you eat. You will need to check your blood glucose levels using a glucose monitor as told by your health care provider. The readings can help determine if you have low or high blood glucose. Generally, you should have these blood glucose levels: Before meals (preprandial): 80-130 mg/dL (4.4-7.2 mmol/L). After meals (postprandial): below 180 mg/dL (10 mmol/L). Hemoglobin A1c (HbA1c) level: less than 7%. Your health care provider will set treatment goals for you. Keep all follow-up visits. This is important. Follow these instructions at home: Diabetes medicines Take your diabetes medicines every day as told by your health care provider. List your diabetes medicines here: Name of medicine: ______________________________ Amount (dose): _______________ Time (a.m./p.m.): _______________ Notes: ___________________________________ Name of medicine: ______________________________ Amount (dose): _______________ Time (a.m./p.m.): _______________ Notes: ___________________________________ Name of medicine: ______________________________ Amount (dose):  _______________ Time (a.m./p.m.): _______________ Notes: ___________________________________ Insulin If you use insulin, list the types of insulin you use here: Insulin type: ______________________________ Amount (dose): _______________ Time (a.m./p.m.): _______________Notes: ___________________________________ Insulin type: ______________________________ Amount (dose): _______________ Time (a.m./p.m.): _______________ Notes: ___________________________________ Insulin type: ______________________________ Amount (dose): _______________ Time (a.m./p.m.): _______________ Notes: ___________________________________ Insulin type: ______________________________ Amount (dose): _______________ Time (a.m./p.m.): _______________ Notes: ___________________________________ Insulin type: ______________________________ Amount (dose): _______________ Time (a.m./p.m.): _______________ Notes: ___________________________________ Managing blood glucose  Check your blood glucose levels using a glucose monitor as told by your health care provider. Write down the times that you check your glucose levels here: Time: _______________ Notes: ___________________________________ Time: _______________ Notes: ___________________________________ Time: _______________ Notes: ___________________________________ Time: _______________ Notes: ___________________________________ Time: _______________ Notes: ___________________________________ Time: _______________ Notes: ___________________________________  Low blood glucose Low blood glucose (hypoglycemia) is when glucose is at or below 70 mg/dL (3.9 mmol/L). Symptoms may include: Feeling: Hungry. Sweaty and clammy. Irritable or easily upset. Dizzy. Sleepy. Having: A fast heartbeat. A headache. A change in your vision. Numbness around the mouth, lips, or tongue. Having trouble with: Moving (coordination). Sleeping. Treating low blood glucose To treat low blood  glucose, eat or drink something containing sugar right away. If you can think clearly and swallow safely, follow the 15:15 rule: Take 15 grams of a fast-acting carb (carbohydrate), as told by your health care provider. Some fast-acting carbs are: Glucose tablets: take 3-4 tablets. Hard candy: eat 3-5 pieces. Fruit juice: drink 4 oz (120 mL). Regular (not diet) soda: drink 4-6 oz (120-180 mL). Honey or sugar: eat 1 Tbsp (15 mL). Check your blood glucose levels 15 minutes after you take the carb. If your glucose is still at or below 70 mg/dL (3.9 mmol/L), take 15 grams of a carb again. If your glucose does not go above 70 mg/dL (3.9 mmol/L) after   3 tries, get help right away. After your glucose goes back to normal, eat a meal or a snack within 1 hour. Treating very low blood glucose If your glucose is at or below 54 mg/dL (3 mmol/L), you have very low blood glucose (severe hypoglycemia). This is an emergency. Do not wait to see if the symptoms will go away. Get medical help right away. Call your local emergency services (911 in the [place] Do not drive yourself to the hospital. Questions to ask your health care provider Should I talk with a diabetes educator? What equipment will I need to care for myself at home? What diabetes medicines do I need? When should I take them? How often do I need to check my blood glucose levels? What number can I call if I have questions? When is my follow-up visit? Where can I find a support group for people with diabetes? Where to find more information American Diabetes Association: www.diabetes.org Association of Diabetes Care and Education Specialists: www.diabeteseducator.org Contact a health care provider if: Your blood glucose is at or above 240 mg/dL (13.3 mmol/L) for 2 days in a row. You have been sick or have had a fever for 2 days or more, and you are not getting better. You have any of these problems for more than 6 hours: You cannot eat or  drink. You feel nauseous. You vomit. You have diarrhea. Get help right away if: Your blood glucose is lower than 54 mg/dL (3 mmol/L). You get confused. You have trouble thinking clearly. You have trouble breathing. These symptoms may represent a serious problem that is an emergency. Do not wait to see if the symptoms will go away. Get medical help right away. Call your local emergency services (911 in the [place] Do not drive yourself to the hospital. Summary Diabetes mellitus is a chronic disease that occurs when the body does not properly use sugar (glucose) that is released from food after you eat. Take insulin and diabetes medicines as told. Check your blood glucose every day, as often as told. Keep all follow-up visits. This is important. This information is not intended to replace advice given to you by your health care provider. Make sure you discuss any questions you have with your health care provider. Document Revised: 07/19/2019 Document Reviewed: 07/19/2019 Elsevier Patient Education  2023 Elsevier Inc.  

## 2022-01-06 ENCOUNTER — Other Ambulatory Visit: Payer: Self-pay | Admitting: Nurse Practitioner

## 2022-01-06 ENCOUNTER — Telehealth: Payer: Self-pay | Admitting: Nurse Practitioner

## 2022-01-06 MED ORDER — TRULICITY 0.75 MG/0.5ML ~~LOC~~ SOAJ: 0.7500 mg | SUBCUTANEOUS | 1 refills | Status: DC

## 2022-01-06 NOTE — Telephone Encounter (Signed)
Patient's brother Dominica Severin was made aware of Jolene's recommendations. Dominica Severin was advised to give our office a call back if he has any questions or concerns to come up. Dominica Severin verbalized understanding.

## 2022-01-06 NOTE — Telephone Encounter (Signed)
PT's brother came in with correspondence from insurance stating that it will not cover [employer].  Brother is concerned as to what medications could be substituted.  Please advise.

## 2022-01-07 DIAGNOSIS — M545 Low back pain, unspecified: Secondary | ICD-10-CM | POA: Diagnosis not present

## 2022-01-07 DIAGNOSIS — M2560 Stiffness of unspecified joint, not elsewhere classified: Secondary | ICD-10-CM | POA: Diagnosis not present

## 2022-01-09 ENCOUNTER — Ambulatory Visit: Payer: Medicaid Other | Admitting: Nurse Practitioner

## 2022-01-09 DIAGNOSIS — R399 Unspecified symptoms and signs involving the genitourinary system: Secondary | ICD-10-CM | POA: Diagnosis not present

## 2022-01-09 DIAGNOSIS — R300 Dysuria: Secondary | ICD-10-CM | POA: Diagnosis not present

## 2022-01-10 ENCOUNTER — Ambulatory Visit (INDEPENDENT_AMBULATORY_CARE_PROVIDER_SITE_OTHER): Payer: Medicaid Other | Admitting: Nurse Practitioner

## 2022-01-10 ENCOUNTER — Telehealth: Payer: Self-pay | Admitting: Internal Medicine

## 2022-01-10 ENCOUNTER — Encounter: Payer: Self-pay | Admitting: Nurse Practitioner

## 2022-01-10 VITALS — BP 128/80 | HR 67 | Temp 97.5°F | Ht 74.0 in | Wt 194.8 lb

## 2022-01-10 DIAGNOSIS — F1010 Alcohol abuse, uncomplicated: Secondary | ICD-10-CM

## 2022-01-10 DIAGNOSIS — F17219 Nicotine dependence, cigarettes, with unspecified nicotine-induced disorders: Secondary | ICD-10-CM | POA: Diagnosis not present

## 2022-01-10 DIAGNOSIS — E1140 Type 2 diabetes mellitus with diabetic neuropathy, unspecified: Secondary | ICD-10-CM

## 2022-01-10 MED ORDER — TRULICITY 0.75 MG/0.5ML ~~LOC~~ SOAJ: 0.7500 mg | SUBCUTANEOUS | 1 refills | Status: DC

## 2022-01-10 NOTE — Assessment & Plan Note (Signed)
Ongoing, recommend continued cut back on alcohol use.  Discussed effect of alcohol on blood pressure and diabetes. 

## 2022-01-10 NOTE — Assessment & Plan Note (Signed)
Chronic, ongoing with A1c trending up to 9.6% last visit and urine ALB 08 December 2021.  Continue Jardiance and Metformin to 1000 MG daily + recommend they pick up and start Trulicity. Recommend they check blood sugar daily and document + focus heavily on diabetic diet at home.   - No current ACE or ARB, discuss next visit, consider ARB due to smoker avoid ACE - Eye exam up to date - Foot exam up to date - Pneumococcal up to date - Recheck A1c in December

## 2022-01-10 NOTE — Telephone Encounter (Signed)
Patient brought in a completed [place] parking placard form that needs to be signed by provider.Form was placed in the providers folder for completion.

## 2022-01-10 NOTE — Assessment & Plan Note (Signed)
I have recommended complete cessation of tobacco use. I have discussed various options available for assistance with tobacco cessation including over the counter methods (Nicotine gum, patch and lozenges). We also discussed prescription options (Chantix, Nicotine Inhaler / Nasal Spray). The patient is not interested in pursuing any prescription tobacco cessation options at this time.  

## 2022-01-10 NOTE — Progress Notes (Signed)
BP 128/80 (BP Location: Left Arm, Patient Position: Sitting, Cuff Size: Normal)   Pulse 67   Temp (!) 97.5 F (36.4 C) (Oral)   Ht '6\' 2"'$  (1.88 m)   Wt 194 lb 12.8 oz (88.4 kg)   SpO2 99%   BMI 25.01 kg/m    Subjective:    Patient ID: Johnny Kelley, male    DOB: 1965/05/28, 56 y.o.   MRN: [id number]  HPI: Johnny Kelley is a 56 y.o. male  Chief Complaint  Patient presents with   Medication Problem    Patient says she he would like to discuss medication alternative for [facility].   Brother at bedside to assist with HPI.  DIABETES Recent A1c 9.6% and was sent in [place], which insurance would not cover -- sent in Trulicity, but has not picked up yet.  Currently taking Metformin 1000 MG daily (did not tolerate higher) and Jardiance 25 MG daily.  He reports his sugars are coming down, they were 400 in morning.    Continues to smoke, 1 pack currently lasting one week, has been cutting back.  Started smoking at age 12.  Currently drinking 2-3 beers a day, this is a cut back for him -- used to drink 5 a day. Hypoglycemic episodes:no Polydipsia/polyuria: yes -- polyuria Visual disturbance: no Chest pain: no Paresthesias: no Glucose Monitoring: yes  Accucheck frequency: Daily  Fasting glucose: 176 to 319  Post prandial:  Evening:  Before meals: Taking Insulin?: no  Long acting insulin:  Short acting insulin: Blood Pressure Monitoring: rarely Retinal Examination: Up to Date Foot Exam: Up to Date Pneumovax:  refuses Influenza:  refuses Aspirin: no  Flowsheet Row Office Visit from 01/10/2022 in [place]  Alcohol Use Disorder Identification Test Final Score (AUDIT) 8       Relevant past medical, surgical, family and social history reviewed and updated as indicated. Interim medical history since our last visit reviewed. Allergies and medications reviewed and updated.  Review of Systems  Constitutional:  Negative for activity change, diaphoresis,  fatigue and fever.  Respiratory:  Negative for cough, chest tightness, shortness of breath and wheezing.   Cardiovascular:  Negative for chest pain, palpitations and leg swelling.  Gastrointestinal: Negative.   Endocrine: Positive for polyuria. Negative for polydipsia and polyphagia.  Neurological: Negative.   Psychiatric/Behavioral: Negative.      Per HPI unless specifically indicated above     Objective:    BP 128/80 (BP Location: Left Arm, Patient Position: Sitting, Cuff Size: Normal)   Pulse 67   Temp (!) 97.5 F (36.4 C) (Oral)   Ht '6\' 2"'$  (1.88 m)   Wt 194 lb 12.8 oz (88.4 kg)   SpO2 99%   BMI 25.01 kg/m   Wt Readings from Last 3 Encounters:  01/10/22 194 lb 12.8 oz (88.4 kg)  12/26/21 197 lb 4.8 oz (89.5 kg)  12/24/21 190 lb 0.6 oz (86.2 kg)    Physical Exam Vitals and nursing note reviewed.  Constitutional:      General: He is awake. He is not in acute distress.    Appearance: He is well-developed and well-groomed. He is not ill-appearing or toxic-appearing.  HENT:     Head: Normocephalic and atraumatic.     Right Ear: Hearing normal. No drainage.     Left Ear: Hearing normal. No drainage.  Eyes:     General: Lids are normal.        Right eye: No discharge.  Left eye: No discharge.     Conjunctiva/sclera: Conjunctivae normal.     Pupils: Pupils are equal, round, and reactive to light.  Neck:     Thyroid: No thyromegaly.     Vascular: No carotid bruit.  Cardiovascular:     Rate and Rhythm: Normal rate and regular rhythm.     Heart sounds: Normal heart sounds, S1 normal and S2 normal. No murmur heard.    No gallop.  Pulmonary:     Effort: Pulmonary effort is normal. No accessory muscle usage or respiratory distress.     Breath sounds: Normal breath sounds.  Abdominal:     General: Bowel sounds are normal.     Palpations: Abdomen is soft. There is no hepatomegaly or splenomegaly.  Musculoskeletal:        General: Normal range of motion.     Cervical  back: Normal range of motion and neck supple.     Right lower leg: No edema.     Left lower leg: No edema.  Lymphadenopathy:     Cervical: No cervical adenopathy.  Skin:    General: Skin is warm and dry.     Capillary Refill: Capillary refill takes less than 2 seconds.  Neurological:     Mental Status: He is alert and oriented to person, place, and time.     Deep Tendon Reflexes: Reflexes are normal and symmetric.     Reflex Scores:      Brachioradialis reflexes are 2+ on the right side and 2+ on the left side.      Patellar reflexes are 2+ on the right side and 2+ on the left side. Psychiatric:        Attention and Perception: Attention normal.        Mood and Affect: Mood normal.        Speech: Speech normal.        Behavior: Behavior normal. Behavior is cooperative.        Thought Content: Thought content normal.    Results for orders placed or performed during the hospital encounter of 12/24/21  CBC with Differential  Result Value Ref Range   WBC 5.7 4.0 - 10.5 K/uL   RBC 4.45 4.22 - 5.81 MIL/uL   Hemoglobin 15.4 13.0 - 17.0 g/dL   HCT 44.1 39.0 - 52.0 %   MCV 99.1 80.0 - 100.0 fL   MCH 34.6 (H) 26.0 - 34.0 pg   MCHC 34.9 30.0 - 36.0 g/dL   RDW 13.4 11.5 - 15.5 %   Platelets 87 (L) 150 - 400 K/uL   nRBC 0.0 0.0 - 0.2 %   Neutrophils Relative % 57 %   Neutro Abs 3.3 1.7 - 7.7 K/uL   Lymphocytes Relative 28 %   Lymphs Abs 1.6 0.7 - 4.0 K/uL   Monocytes Relative 12 %   Monocytes Absolute 0.7 0.1 - 1.0 K/uL   Eosinophils Relative 2 %   Eosinophils Absolute 0.1 0.0 - 0.5 K/uL   Basophils Relative 1 %   Basophils Absolute 0.0 0.0 - 0.1 K/uL   Immature Granulocytes 0 %   Abs Immature Granulocytes 0.01 0.00 - 0.07 K/uL  Comprehensive metabolic panel  Result Value Ref Range   Sodium 131 (L) 135 - 145 mmol/L   Potassium 3.8 3.5 - 5.1 mmol/L   Chloride 90 (L) 98 - 111 mmol/L   CO2 28 22 - 32 mmol/L   Glucose, Bld 400 (H) 70 - 99 mg/dL   BUN 9 6 -  20 mg/dL   Creatinine,  Ser 1.03 0.61 - 1.24 mg/dL   Calcium 10.0 8.9 - 10.3 mg/dL   Total Protein 8.1 6.5 - 8.1 g/dL   Albumin 3.5 3.5 - 5.0 g/dL   AST 110 (H) 15 - 41 U/L   ALT 68 (H) 0 - 44 U/L   Alkaline Phosphatase 232 (H) 38 - 126 U/L   Total Bilirubin 3.2 (H) 0.3 - 1.2 mg/dL   GFR, Estimated >60 >60 mL/min   Anion gap 13 5 - 15  Urinalysis, Routine w reflex microscopic Urine, Clean Catch  Result Value Ref Range   Color, Urine YELLOW (A) YELLOW   APPearance CLEAR (A) CLEAR   Specific Gravity, Urine 1.030 1.005 - 1.030   pH 6.0 5.0 - 8.0   Glucose, UA >=500 (A) NEGATIVE mg/dL   Hgb urine dipstick NEGATIVE NEGATIVE   Bilirubin Urine NEGATIVE NEGATIVE   Ketones, ur NEGATIVE NEGATIVE mg/dL   Protein, ur NEGATIVE NEGATIVE mg/dL   Nitrite NEGATIVE NEGATIVE   Leukocytes,Ua TRACE (A) NEGATIVE   RBC / HPF 0-5 0 - 5 RBC/hpf   WBC, UA 0-5 0 - 5 WBC/hpf   Bacteria, UA RARE (A) NONE SEEN   Squamous Epithelial / LPF 0-5 0 - 5   Mucus PRESENT   POC CBG, ED  Result Value Ref Range   Glucose-Capillary 411 (H) 70 - 99 mg/dL      Assessment & Plan:   Problem List Items Addressed This Visit       Endocrine   Type 2 diabetes mellitus with diabetic neuropathy, without long-term current use of insulin (HCC) - Primary    Chronic, ongoing with A1c trending up to 9.6% last visit and urine ALB 08 December 2021.  Continue Jardiance and Metformin to 1000 MG daily + recommend they pick up and start Trulicity. Recommend they check blood sugar daily and document + focus heavily on diabetic diet at home.   - No current ACE or ARB, discuss next visit, consider ARB due to smoker avoid ACE - Eye exam up to date - Foot exam up to date - Pneumococcal up to date - Recheck A1c in December      Relevant Medications   Dulaglutide (TRULICITY) 4.88 QB/1.6XI SOPN     Nervous and Auditory   Nicotine dependence, cigarettes, w unsp disorders    I have recommended complete cessation of tobacco use. I have discussed various  options available for assistance with tobacco cessation including over the counter methods (Nicotine gum, patch and lozenges). We also discussed prescription options (Chantix, Nicotine Inhaler / Nasal Spray). The patient is not interested in pursuing any prescription tobacco cessation options at this time.       Relevant Medications   nicotine (NICODERM CQ - DOSED IN MG/24 HOURS) 14 mg/24hr patch     Other   Alcohol abuse    Ongoing, recommend continued cut back on alcohol use.  Discussed effect of alcohol on blood pressure and diabetes.        Follow up plan: Return in about 2 months (around 03/13/2022) for T2DM, HTN/HLD, LIVER CA, ALCOHOL USE.

## 2022-01-13 NOTE — Telephone Encounter (Signed)
Spoke with patient and notified him that his handicap placard paperwork was ready for pick up. Patient says he will stop by tomorrow to pick up. Patient verbalized understanding.

## 2022-01-14 DIAGNOSIS — M545 Low back pain, unspecified: Secondary | ICD-10-CM | POA: Diagnosis not present

## 2022-01-14 DIAGNOSIS — M2560 Stiffness of unspecified joint, not elsewhere classified: Secondary | ICD-10-CM | POA: Diagnosis not present

## 2022-01-17 DIAGNOSIS — M545 Low back pain, unspecified: Secondary | ICD-10-CM | POA: Diagnosis not present

## 2022-01-17 DIAGNOSIS — M2560 Stiffness of unspecified joint, not elsewhere classified: Secondary | ICD-10-CM | POA: Diagnosis not present

## 2022-01-21 DIAGNOSIS — M2560 Stiffness of unspecified joint, not elsewhere classified: Secondary | ICD-10-CM | POA: Diagnosis not present

## 2022-01-21 DIAGNOSIS — M545 Low back pain, unspecified: Secondary | ICD-10-CM | POA: Diagnosis not present

## 2022-01-24 DIAGNOSIS — M2560 Stiffness of unspecified joint, not elsewhere classified: Secondary | ICD-10-CM | POA: Diagnosis not present

## 2022-01-24 DIAGNOSIS — M545 Low back pain, unspecified: Secondary | ICD-10-CM | POA: Diagnosis not present

## 2022-01-28 DIAGNOSIS — M545 Low back pain, unspecified: Secondary | ICD-10-CM | POA: Diagnosis not present

## 2022-01-28 DIAGNOSIS — M2560 Stiffness of unspecified joint, not elsewhere classified: Secondary | ICD-10-CM | POA: Diagnosis not present

## 2022-01-29 DIAGNOSIS — M5416 Radiculopathy, lumbar region: Secondary | ICD-10-CM | POA: Diagnosis not present

## 2022-01-29 DIAGNOSIS — E119 Type 2 diabetes mellitus without complications: Secondary | ICD-10-CM | POA: Diagnosis not present

## 2022-02-04 DIAGNOSIS — M545 Low back pain, unspecified: Secondary | ICD-10-CM | POA: Diagnosis not present

## 2022-02-04 DIAGNOSIS — M2560 Stiffness of unspecified joint, not elsewhere classified: Secondary | ICD-10-CM | POA: Diagnosis not present

## 2022-02-05 DIAGNOSIS — C220 Liver cell carcinoma: Secondary | ICD-10-CM | POA: Diagnosis not present

## 2022-02-07 ENCOUNTER — Other Ambulatory Visit: Payer: Self-pay | Admitting: Dermatology

## 2022-02-07 ENCOUNTER — Other Ambulatory Visit: Payer: Self-pay

## 2022-02-07 DIAGNOSIS — B351 Tinea unguium: Secondary | ICD-10-CM

## 2022-02-07 MED ORDER — FAMOTIDINE 20 MG PO TABS: ORAL_TABLET | ORAL | 1 refills | Status: DC

## 2022-02-07 NOTE — Telephone Encounter (Signed)
Medication refill for Famotidine '20mg'$   MG last ov 01/10/22, upcoming ov 03/12/22 . Please advise

## 2022-02-10 DIAGNOSIS — M2560 Stiffness of unspecified joint, not elsewhere classified: Secondary | ICD-10-CM | POA: Diagnosis not present

## 2022-02-10 DIAGNOSIS — M545 Low back pain, unspecified: Secondary | ICD-10-CM | POA: Diagnosis not present

## 2022-02-11 ENCOUNTER — Other Ambulatory Visit: Payer: Self-pay

## 2022-02-11 DIAGNOSIS — I10 Essential (primary) hypertension: Secondary | ICD-10-CM

## 2022-02-11 MED ORDER — LOSARTAN POTASSIUM 100 MG PO TABS: 100.0000 mg | ORAL_TABLET | Freq: Every day | ORAL | 0 refills | Status: DC

## 2022-02-13 IMAGING — DX DG LUMBAR SPINE COMPLETE 4+V
5 series · 5 of 5 positions shown · non-contrast
Comparison: CT abdomen pelvis 05/18/2020

CLINICAL DATA: back pain hepatocellular ca ? Mets

EXAM:
LUMBAR SPINE - COMPLETE 4+ VIEW

[l-spine ap]
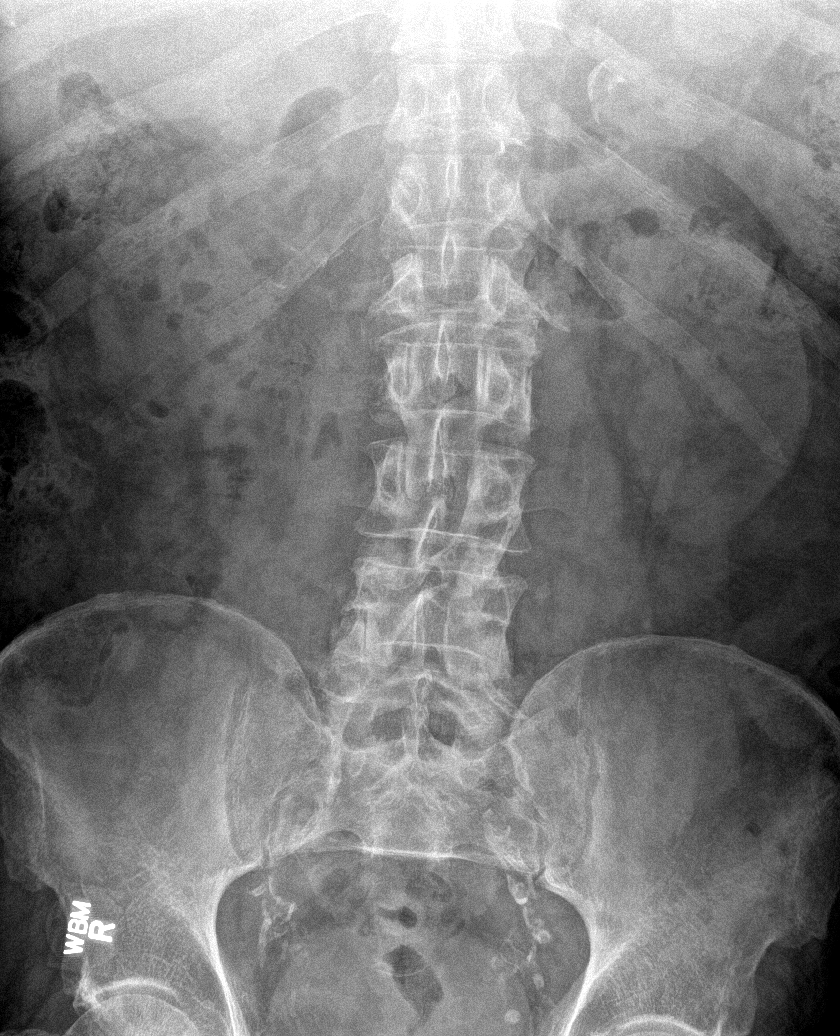

[l-spine obl (1 of 2)]
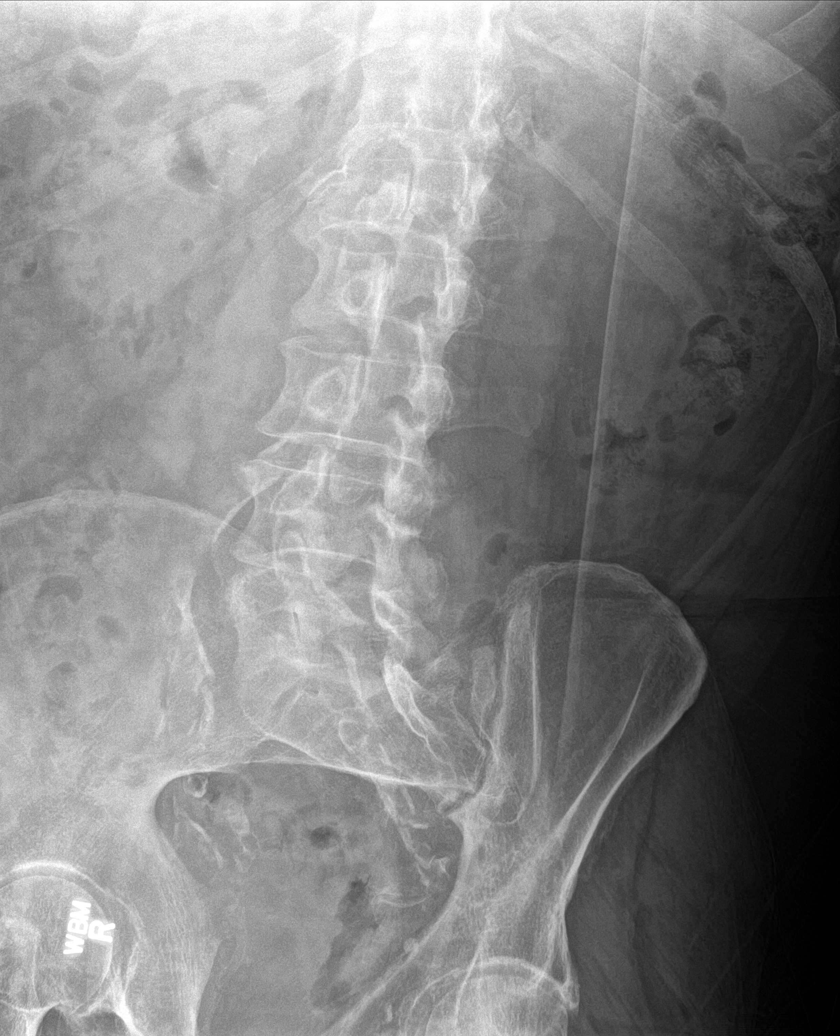

[l-spine obl (2 of 2)]
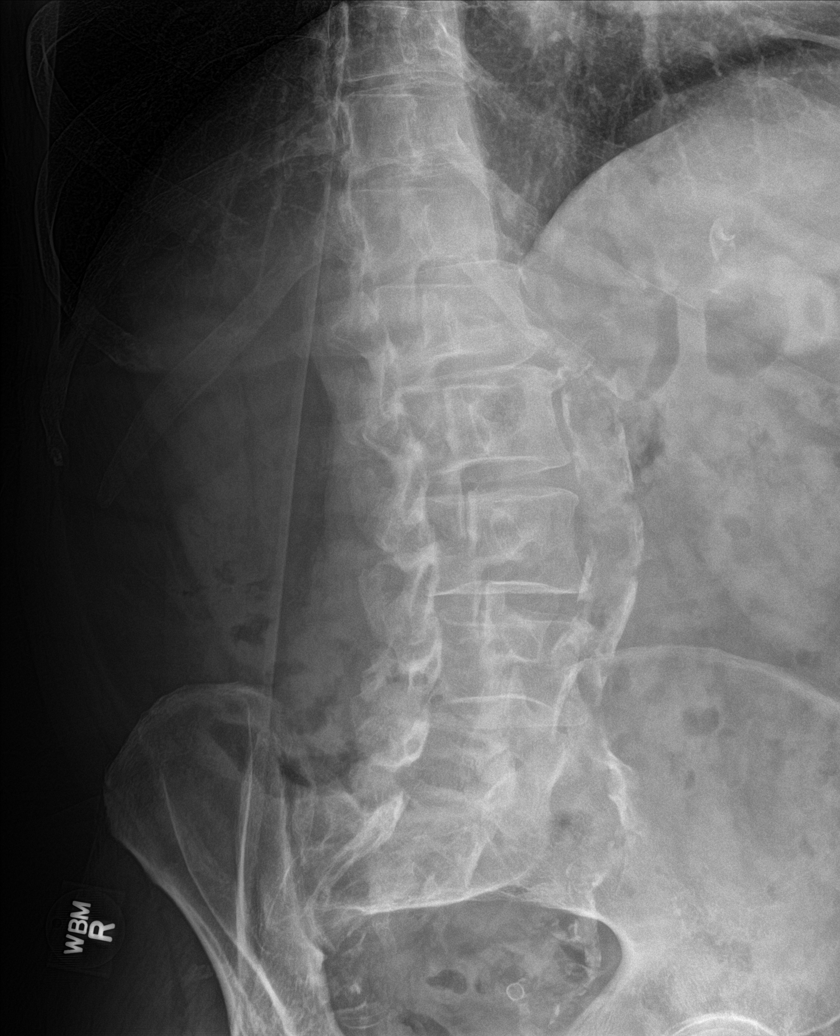

[l-spine lat]
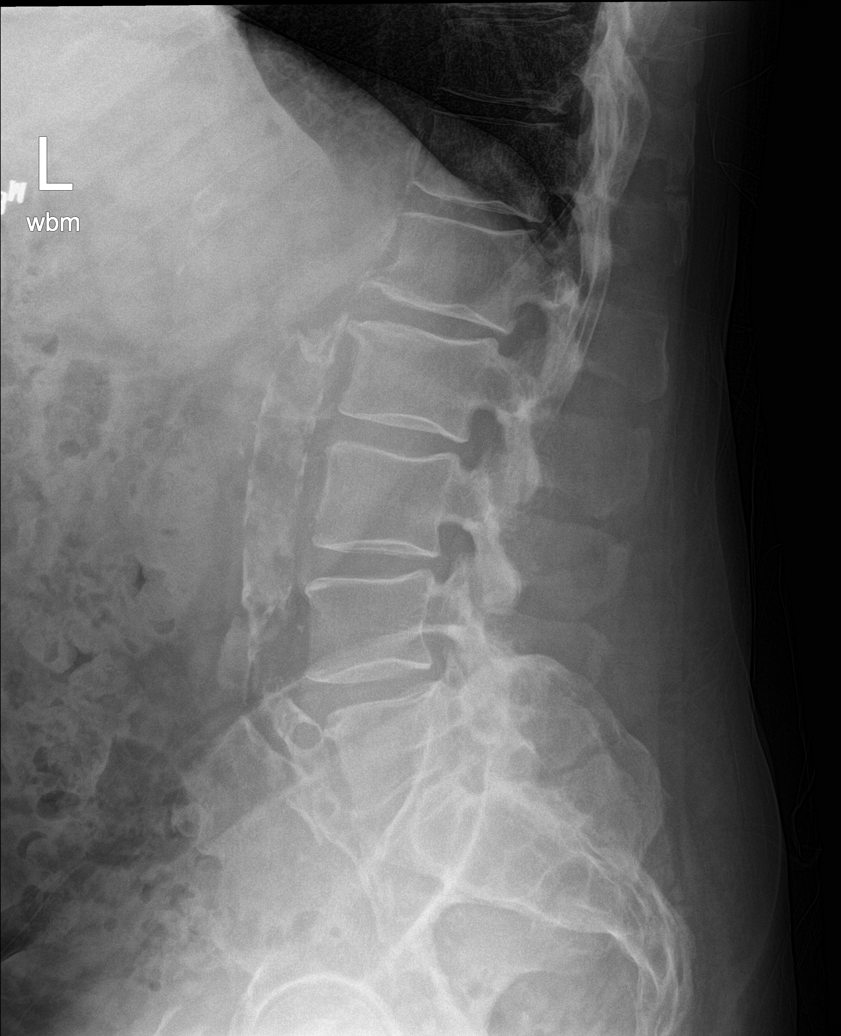

[l-spine spot]
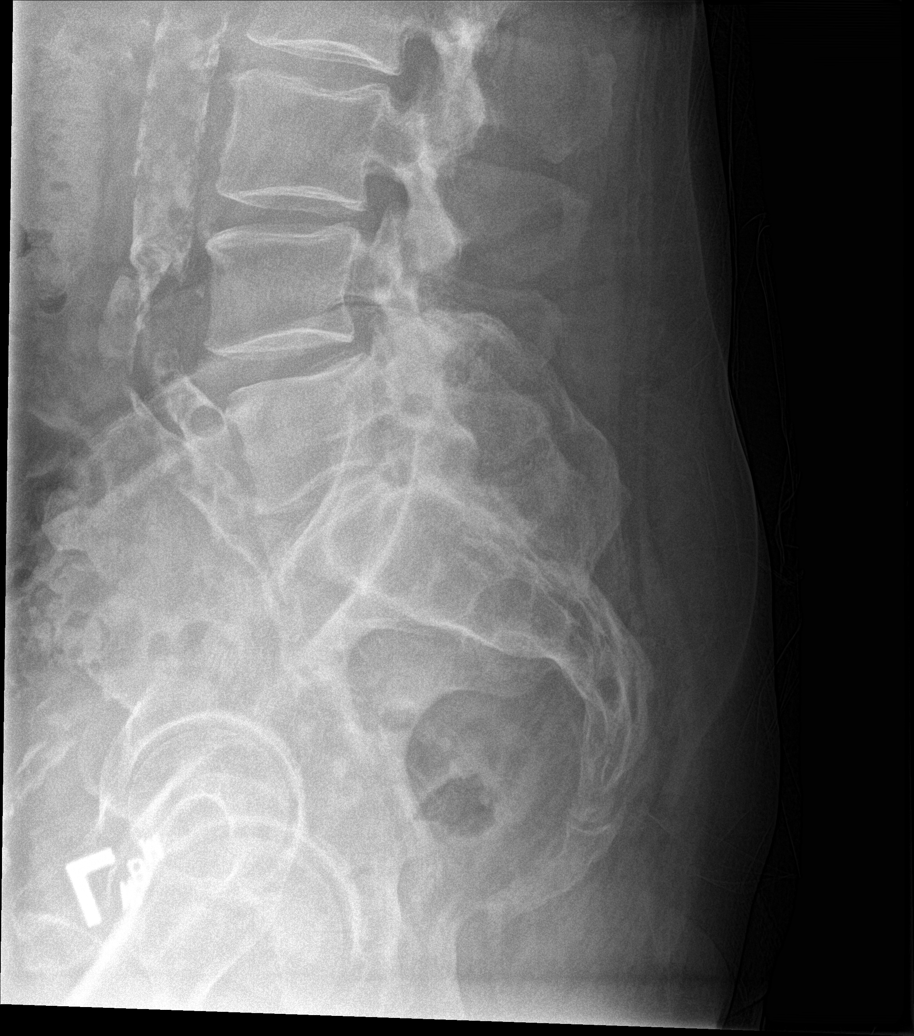

[5 of 5 positions shown; findings below may reference images not displayed]

FINDINGS: There are 5 non-rib-bearing lumbar vertebrae. There are mild
multilevel degenerative endplate changes. There is mild-moderate
lower lumbar predominant facet arthritis. Trace retrolisthesis at
L5-S1. There is no evidence of fracture. Aortoiliac atherosclerotic
calcifications.
IMPRESSION: Mild multilevel degenerative endplate changes with mild to moderate
lower lumbar predominant facet arthritis.

Trace retrolisthesis at L5-S1.

No evidence of fracture or radiographically evident bony lesion.

## 2022-02-18 ENCOUNTER — Encounter: Payer: Self-pay | Admitting: Nurse Practitioner

## 2022-02-18 ENCOUNTER — Ambulatory Visit (INDEPENDENT_AMBULATORY_CARE_PROVIDER_SITE_OTHER): Payer: Medicaid Other | Admitting: Dermatology

## 2022-02-18 DIAGNOSIS — L578 Other skin changes due to chronic exposure to nonionizing radiation: Secondary | ICD-10-CM | POA: Diagnosis not present

## 2022-02-18 DIAGNOSIS — L570 Actinic keratosis: Secondary | ICD-10-CM

## 2022-02-18 DIAGNOSIS — L820 Inflamed seborrheic keratosis: Secondary | ICD-10-CM | POA: Diagnosis not present

## 2022-02-18 DIAGNOSIS — L821 Other seborrheic keratosis: Secondary | ICD-10-CM

## 2022-02-18 DIAGNOSIS — L918 Other hypertrophic disorders of the skin: Secondary | ICD-10-CM

## 2022-02-18 NOTE — Progress Notes (Signed)
Follow-Up Visit   Subjective  Johnny Kelley is a 56 y.o. male who presents for the following: Follow-up (4 months f/u on precancers on his face, patient was prescribed 5FU/Calcipotriene cream he never received from the pharmacy because patient do not want to use 5FU/Calcipotriene cream. ). The patient has lesions under his arms to be evaluated, some may be new or changing.  The patient has spots, moles and lesions to be evaluated, some may be new or changing and the patient has concerns that these could be cancer.  The following portions of the chart were reviewed this encounter and updated as appropriate:   Tobacco  Allergies  Meds  Problems  Med Hx  Surg Hx  Fam Hx     Review of Systems:  No other skin or systemic complaints except as noted in HPI or Assessment and Plan.  Objective  Well appearing patient in no apparent distress; mood and affect are within normal limits.  A focused examination was performed including face,arms. Relevant physical exam findings are noted in the Assessment and Plan.  right axilla x 4, left axilla x 4  (8) (8) Stuck-on, waxy, tan-brown papules --Discussed benign etiology and prognosis.   face x 18 (18) Erythematous thin papules/macules with gritty scale.    Assessment & Plan  Inflamed seborrheic keratosis (8) right axilla x 4, left axilla x 4  (8)  Symptomatic, irritating, patient would like treated.   Destruction of lesion - right axilla x 4, left axilla x 4  (8) Complexity: simple   Destruction method: cryotherapy   Informed consent: discussed and consent obtained   Timeout:  patient name, date of birth, surgical site, and procedure verified Lesion destroyed using liquid nitrogen: Yes   Region frozen until ice ball extended beyond lesion: Yes   Outcome: patient tolerated procedure well with no complications   Post-procedure details: wound care instructions given    AK (actinic keratosis) (18) face x 18  Actinic keratoses are  precancerous spots that appear secondary to cumulative UV radiation exposure/sun exposure over time. They are chronic with expected duration over 1 year. A portion of actinic keratoses will progress to squamous cell carcinoma of the skin. It is not possible to reliably predict which spots will progress to skin cancer and so treatment is recommended to prevent development of skin cancer.  Recommend daily broad spectrum sunscreen SPF 30+ to sun-exposed areas, reapply every 2 hours as needed.  Recommend staying in the shade or wearing long sleeves, sun glasses (UVA+UVB protection) and wide brim hats (4-inch brim around the entire circumference of the hat). Call for new or changing lesions.   Destruction of lesion - face x 18 Complexity: simple   Destruction method: cryotherapy   Informed consent: discussed and consent obtained   Timeout:  patient name, date of birth, surgical site, and procedure verified Lesion destroyed using liquid nitrogen: Yes   Region frozen until ice ball extended beyond lesion: Yes   Outcome: patient tolerated procedure well with no complications   Post-procedure details: wound care instructions given    Actinic skin damage  Seborrheic keratosis  Acrochordons (Skin Tags) - Removal desired by patient Symptomatic, irritating, patient would like treated.  - Fleshy, skin-colored pedunculated papules - Benign appearing.  - Patient desires removal. Reviewed that this is not covered by insurance and they will be charged a cosmetic fee for removal. Patient signed non-covered consent.  - Prior to procedure, discussed risks of blister formation, small wound, skin dyspigmentation, or  rare scar following cryotherapy.  PROCEDURE - Cryotherapy was performed to affected areas. The procedure was tolerated well. Wound care was reviewed with the patient. They were advised to call with any concerns. Total number of treated acrochordons 2 right axilla    Actinic Damage - chronic,  secondary to cumulative UV radiation exposure/sun exposure over time - diffuse scaly erythematous macules with underlying dyspigmentation - Recommend daily broad spectrum sunscreen SPF 30+ to sun-exposed areas, reapply every 2 hours as needed.  - Recommend staying in the shade or wearing long sleeves, sun glasses (UVA+UVB protection) and wide brim hats (4-inch brim around the entire circumference of the hat). - Call for new or changing lesions.   Seborrheic Keratoses - Stuck-on, waxy, tan-brown papules and/or plaques  - Benign-appearing - Discussed benign etiology and prognosis. - Observe - Call for any changes  Return in about 6 months (around 08/19/2022) for Aks .  IMarye Round, CMA, am acting as scribe for Sarina Ser, MD .  Documentation: I have reviewed the above documentation for accuracy and completeness, and I agree with the above.  Sarina Ser, MD

## 2022-02-18 NOTE — Patient Instructions (Addendum)
Cryotherapy Aftercare  Wash gently with soap and water everyday.   Apply Vaseline and Band-Aid daily until healed.     Due to recent changes in healthcare laws, you may see results of your pathology and/or laboratory studies on MyChart before the doctors have had a chance to review them. We understand that in some cases there may be results that are confusing or concerning to you. Please understand that not all results are received at the same time and often the doctors may need to interpret multiple results in order to provide you with the best plan of care or course of treatment. Therefore, we ask that you please give us 2 business days to thoroughly review all your results before contacting the office for clarification. Should we see a critical lab result, you will be contacted sooner.   If You Need Anything After Your Visit  If you have any questions or concerns for your doctor, please call our main line at [phone] and press option 4 to reach your doctor's medical assistant. If no one answers, please leave a voicemail as directed and we will return your call as soon as possible. Messages left after 4 pm will be answered the following business day.   You may also send us a message via MyChart. We typically respond to MyChart messages within 1-2 business days.  For prescription refills, please ask your pharmacy to contact our office. Our fax number is [phone].  If you have an urgent issue when the clinic is closed that cannot wait until the next business day, you can page your doctor at the number below.    Please note that while we do our best to be available for urgent issues outside of office hours, we are not available 24/7.   If you have an urgent issue and are unable to reach us, you may choose to seek medical care at your doctor's office, retail clinic, urgent care center, or emergency room.  If you have a medical emergency, please immediately call 911 or go to the  emergency department.  Pager Numbers  - Dr. Kowalski: [phone]  - Dr. Moye: [phone]  - Dr. Stewart: [phone]  In the event of inclement weather, please call our main line at [phone] for an update on the status of any delays or closures.  Dermatology Medication Tips: Please keep the boxes that topical medications come in in order to help keep track of the instructions about where and how to use these. Pharmacies typically print the medication instructions only on the boxes and not directly on the medication tubes.   If your medication is too expensive, please contact our office at [phone] option 4 or send us a message through MyChart.   We are unable to tell what your co-pay for medications will be in advance as this is different depending on your insurance coverage. However, we may be able to find a substitute medication at lower cost or fill out paperwork to get insurance to cover a needed medication.   If a prior authorization is required to get your medication covered by your insurance company, please allow us 1-2 business days to complete this process.  Drug prices often vary depending on where the prescription is filled and some pharmacies may offer cheaper prices.  The website www.goodrx.com contains coupons for medications through different pharmacies. The prices here do not account for what the cost may be with help from insurance (it may be cheaper with your insurance), but the website can   give you the price if you did not use any insurance.  - You can print the associated coupon and take it with your prescription to the pharmacy.  - You may also stop by our office during regular business hours and pick up a GoodRx coupon card.  - If you need your prescription sent electronically to a different pharmacy, notify our office through [place] MyChart or by phone at [phone] option 4.     Si Usted Necesita Algo Despus de Su Visita  Tambin puede  enviarnos un mensaje a travs de MyChart. Por lo general respondemos a los mensajes de MyChart en el transcurso de 1 a 2 das hbiles.  Para renovar recetas, por favor pida a su farmacia que se ponga en contacto con nuestra oficina. Nuestro nmero de fax es el [phone].  Si tiene un asunto urgente cuando la clnica est cerrada y que no puede esperar hasta el siguiente da hbil, puede llamar/localizar a su doctor(a) al nmero que aparece a continuacin.   Por favor, tenga en cuenta que aunque hacemos todo lo posible para estar disponibles para asuntos urgentes fuera del horario de oficina, no estamos disponibles las 24 horas del da, los 7 das de la semana.   Si tiene un problema urgente y no puede comunicarse con nosotros, puede optar por buscar atencin mdica  en el consultorio de su doctor(a), en una clnica privada, en un centro de atencin urgente o en una sala de emergencias.  Si tiene una emergencia mdica, por favor llame inmediatamente al 911 o vaya a la sala de emergencias.  Nmeros de bper  - Dr. Kowalski: [phone]  - Dra. Moye: [phone]  - Dra. Stewart: [phone]  En caso de inclemencias del tiempo, por favor llame a nuestra lnea principal al [phone] para una actualizacin sobre el estado de cualquier retraso o cierre.  Consejos para la medicacin en dermatologa: Por favor, guarde las cajas en las que vienen los medicamentos de uso tpico para ayudarle a seguir las instrucciones sobre dnde y cmo usarlos. Las farmacias generalmente imprimen las instrucciones del medicamento slo en las cajas y no directamente en los tubos del medicamento.   Si su medicamento es muy caro, por favor, pngase en contacto con nuestra oficina llamando al [phone] y presione la opcin 4 o envenos un mensaje a travs de MyChart.   No podemos decirle cul ser su copago por los medicamentos por adelantado ya que esto es diferente dependiendo de la cobertura de su seguro.  Sin embargo, es posible que podamos encontrar un medicamento sustituto a menor costo o llenar un formulario para que el seguro cubra el medicamento que se considera necesario.   Si se requiere una autorizacin previa para que su compaa de seguros cubra su medicamento, por favor permtanos de 1 a 2 das hbiles para completar este proceso.  Los precios de los medicamentos varan con frecuencia dependiendo del lugar de dnde se surte la receta y alguna farmacias pueden ofrecer precios ms baratos.  El sitio web www.goodrx.com tiene cupones para medicamentos de diferentes farmacias. Los precios aqu no tienen en cuenta lo que podra costar con la ayuda del seguro (puede ser ms barato con su seguro), pero el sitio web puede darle el precio si no utiliz ningn seguro.  - Puede imprimir el cupn correspondiente y llevarlo con su receta a la farmacia.  - Tambin puede pasar por nuestra oficina durante el horario de atencin regular y recoger una tarjeta de cupones de GoodRx.  -   Si necesita que su receta se enve electrnicamente a una farmacia diferente, informe a nuestra oficina a travs de MyChart de [facility] o por telfono llamando al [phone] y presione la opcin 4.  

## 2022-02-24 DIAGNOSIS — M545 Low back pain, unspecified: Secondary | ICD-10-CM | POA: Diagnosis not present

## 2022-02-24 DIAGNOSIS — M2560 Stiffness of unspecified joint, not elsewhere classified: Secondary | ICD-10-CM | POA: Diagnosis not present

## 2022-02-26 DIAGNOSIS — M2560 Stiffness of unspecified joint, not elsewhere classified: Secondary | ICD-10-CM | POA: Diagnosis not present

## 2022-02-26 DIAGNOSIS — M545 Low back pain, unspecified: Secondary | ICD-10-CM | POA: Diagnosis not present

## 2022-03-04 DIAGNOSIS — M545 Low back pain, unspecified: Secondary | ICD-10-CM | POA: Diagnosis not present

## 2022-03-04 DIAGNOSIS — M2560 Stiffness of unspecified joint, not elsewhere classified: Secondary | ICD-10-CM | POA: Diagnosis not present

## 2022-03-09 ENCOUNTER — Encounter: Payer: Self-pay | Admitting: Dermatology

## 2022-03-09 NOTE — Patient Instructions (Signed)
Diabetes Mellitus Basics  Diabetes mellitus, or diabetes, is a long-term (chronic) disease. It occurs when the body does not properly use sugar (glucose) that is released from food after you eat. Diabetes mellitus may be caused by one or both of these problems: Your pancreas does not make enough of a hormone called insulin. Your body does not react in a normal way to the insulin that it makes. Insulin lets glucose enter cells in your body. This gives you energy. If you have diabetes, glucose cannot get into cells. This causes high blood glucose (hyperglycemia). How to treat and manage diabetes You may need to take insulin or other diabetes medicines daily to keep your glucose in balance. If you are prescribed insulin, you will learn how to give yourself insulin by injection. You may need to adjust the amount of insulin you take based on the foods that you eat. You will need to check your blood glucose levels using a glucose monitor as told by your health care provider. The readings can help determine if you have low or high blood glucose. Generally, you should have these blood glucose levels: Before meals (preprandial): 80-130 mg/dL (4.4-7.2 mmol/L). After meals (postprandial): below 180 mg/dL (10 mmol/L). Hemoglobin A1c (HbA1c) level: less than 7%. Your health care provider will set treatment goals for you. Keep all follow-up visits. This is important. Follow these instructions at home: Diabetes medicines Take your diabetes medicines every day as told by your health care provider. List your diabetes medicines here: Name of medicine: ______________________________ Amount (dose): _______________ Time (a.m./p.m.): _______________ Notes: ___________________________________ Name of medicine: ______________________________ Amount (dose): _______________ Time (a.m./p.m.): _______________ Notes: ___________________________________ Name of medicine: ______________________________ Amount (dose):  _______________ Time (a.m./p.m.): _______________ Notes: ___________________________________ Insulin If you use insulin, list the types of insulin you use here: Insulin type: ______________________________ Amount (dose): _______________ Time (a.m./p.m.): _______________Notes: ___________________________________ Insulin type: ______________________________ Amount (dose): _______________ Time (a.m./p.m.): _______________ Notes: ___________________________________ Insulin type: ______________________________ Amount (dose): _______________ Time (a.m./p.m.): _______________ Notes: ___________________________________ Insulin type: ______________________________ Amount (dose): _______________ Time (a.m./p.m.): _______________ Notes: ___________________________________ Insulin type: ______________________________ Amount (dose): _______________ Time (a.m./p.m.): _______________ Notes: ___________________________________ Managing blood glucose  Check your blood glucose levels using a glucose monitor as told by your health care provider. Write down the times that you check your glucose levels here: Time: _______________ Notes: ___________________________________ Time: _______________ Notes: ___________________________________ Time: _______________ Notes: ___________________________________ Time: _______________ Notes: ___________________________________ Time: _______________ Notes: ___________________________________ Time: _______________ Notes: ___________________________________  Low blood glucose Low blood glucose (hypoglycemia) is when glucose is at or below 70 mg/dL (3.9 mmol/L). Symptoms may include: Feeling: Hungry. Sweaty and clammy. Irritable or easily upset. Dizzy. Sleepy. Having: A fast heartbeat. A headache. A change in your vision. Numbness around the mouth, lips, or tongue. Having trouble with: Moving (coordination). Sleeping. Treating low blood glucose To treat low blood  glucose, eat or drink something containing sugar right away. If you can think clearly and swallow safely, follow the 15:15 rule: Take 15 grams of a fast-acting carb (carbohydrate), as told by your health care provider. Some fast-acting carbs are: Glucose tablets: take 3-4 tablets. Hard candy: eat 3-5 pieces. Fruit juice: drink 4 oz (120 mL). Regular (not diet) soda: drink 4-6 oz (120-180 mL). Honey or sugar: eat 1 Tbsp (15 mL). Check your blood glucose levels 15 minutes after you take the carb. If your glucose is still at or below 70 mg/dL (3.9 mmol/L), take 15 grams of a carb again. If your glucose does not go above 70 mg/dL (3.9 mmol/L) after   3 tries, get help right away. After your glucose goes back to normal, eat a meal or a snack within 1 hour. Treating very low blood glucose If your glucose is at or below 54 mg/dL (3 mmol/L), you have very low blood glucose (severe hypoglycemia). This is an emergency. Do not wait to see if the symptoms will go away. Get medical help right away. Call your local emergency services (911 in the [place] Do not drive yourself to the hospital. Questions to ask your health care provider Should I talk with a diabetes educator? What equipment will I need to care for myself at home? What diabetes medicines do I need? When should I take them? How often do I need to check my blood glucose levels? What number can I call if I have questions? When is my follow-up visit? Where can I find a support group for people with diabetes? Where to find more information American Diabetes Association: www.diabetes.org Association of Diabetes Care and Education Specialists: www.diabeteseducator.org Contact a health care provider if: Your blood glucose is at or above 240 mg/dL (13.3 mmol/L) for 2 days in a row. You have been sick or have had a fever for 2 days or more, and you are not getting better. You have any of these problems for more than 6 hours: You cannot eat or  drink. You feel nauseous. You vomit. You have diarrhea. Get help right away if: Your blood glucose is lower than 54 mg/dL (3 mmol/L). You get confused. You have trouble thinking clearly. You have trouble breathing. These symptoms may represent a serious problem that is an emergency. Do not wait to see if the symptoms will go away. Get medical help right away. Call your local emergency services (911 in the [place] Do not drive yourself to the hospital. Summary Diabetes mellitus is a chronic disease that occurs when the body does not properly use sugar (glucose) that is released from food after you eat. Take insulin and diabetes medicines as told. Check your blood glucose every day, as often as told. Keep all follow-up visits. This is important. This information is not intended to replace advice given to you by your health care provider. Make sure you discuss any questions you have with your health care provider. Document Revised: 07/19/2019 Document Reviewed: 07/19/2019 Elsevier Patient Education  2023 Elsevier Inc.  

## 2022-03-11 DIAGNOSIS — M2560 Stiffness of unspecified joint, not elsewhere classified: Secondary | ICD-10-CM | POA: Diagnosis not present

## 2022-03-11 DIAGNOSIS — M545 Low back pain, unspecified: Secondary | ICD-10-CM | POA: Diagnosis not present

## 2022-03-12 ENCOUNTER — Ambulatory Visit (INDEPENDENT_AMBULATORY_CARE_PROVIDER_SITE_OTHER): Payer: Medicaid Other | Admitting: Nurse Practitioner

## 2022-03-12 ENCOUNTER — Encounter: Payer: Self-pay | Admitting: Nurse Practitioner

## 2022-03-12 VITALS — BP 136/80 | HR 76 | Temp 98.0°F | Ht 74.02 in | Wt 191.5 lb

## 2022-03-12 DIAGNOSIS — Z8673 Personal history of transient ischemic attack (TIA), and cerebral infarction without residual deficits: Secondary | ICD-10-CM

## 2022-03-12 DIAGNOSIS — E1159 Type 2 diabetes mellitus with other circulatory complications: Secondary | ICD-10-CM

## 2022-03-12 DIAGNOSIS — E1169 Type 2 diabetes mellitus with other specified complication: Secondary | ICD-10-CM

## 2022-03-12 DIAGNOSIS — Z23 Encounter for immunization: Secondary | ICD-10-CM

## 2022-03-12 DIAGNOSIS — I152 Hypertension secondary to endocrine disorders: Secondary | ICD-10-CM | POA: Diagnosis not present

## 2022-03-12 DIAGNOSIS — N400 Enlarged prostate without lower urinary tract symptoms: Secondary | ICD-10-CM

## 2022-03-12 DIAGNOSIS — C220 Liver cell carcinoma: Secondary | ICD-10-CM

## 2022-03-12 DIAGNOSIS — N138 Other obstructive and reflux uropathy: Secondary | ICD-10-CM | POA: Insufficient documentation

## 2022-03-12 DIAGNOSIS — F17219 Nicotine dependence, cigarettes, with unspecified nicotine-induced disorders: Secondary | ICD-10-CM

## 2022-03-12 DIAGNOSIS — E1140 Type 2 diabetes mellitus with diabetic neuropathy, unspecified: Secondary | ICD-10-CM | POA: Diagnosis not present

## 2022-03-12 DIAGNOSIS — E785 Hyperlipidemia, unspecified: Secondary | ICD-10-CM

## 2022-03-12 DIAGNOSIS — G319 Degenerative disease of nervous system, unspecified: Secondary | ICD-10-CM

## 2022-03-12 DIAGNOSIS — N401 Enlarged prostate with lower urinary tract symptoms: Secondary | ICD-10-CM

## 2022-03-12 DIAGNOSIS — F1010 Alcohol abuse, uncomplicated: Secondary | ICD-10-CM

## 2022-03-12 LAB — BAYER DCA HB A1C WAIVED: HB A1C (BAYER DCA - WAIVED): 8.2 % — ABNORMAL HIGH (ref 4.8–5.6)

## 2022-03-12 MED ORDER — TRULICITY 1.5 MG/0.5ML ~~LOC~~ SOAJ: 1.5000 mg | SUBCUTANEOUS | 4 refills | Status: DC

## 2022-03-12 MED ORDER — HYDROCHLOROTHIAZIDE 25 MG PO TABS: 25.0000 mg | ORAL_TABLET | Freq: Every day | ORAL | 4 refills | Status: DC

## 2022-03-12 NOTE — Progress Notes (Signed)
BP 136/80 (BP Location: Left Arm, Patient Position: Sitting, Cuff Size: Normal)   Pulse 76   Temp 98 F (36.7 C) (Oral)   Ht 6' 2.02" (1.88 m)   Wt 191 lb 8 oz (86.9 kg)   SpO2 97%   BMI 24.58 kg/m    Subjective:    Patient ID: Johnny Kelley, male    DOB: July 25, 1965, 56 y.o.   MRN: [id number]  HPI: Johnny Kelley is a 56 y.o. male  Chief Complaint  Patient presents with   Diabetes   Hypertension   Hyperlipidemia   Hepatic Cancer   Alchohol abuse   DIABETES Last visit September 9.6%.  Continues on Jardiance and Metformin. Started Trulicity last visit and is noticing reduction in sugars at home.  He reports he has been changing diet at home + working on reducing alcohol use.  He is followed by oncology for liver cancer with last visit on 02/05/22 (two embolizations) -- sees Dr. Janese Banks at end of next month.  He reports he does continue to drink every  day with reduction to 2 drinks a day -- beer.  Less cravings with Trulicity. Hypoglycemic episodes:no Polydipsia/polyuria: no Visual disturbance: no Chest pain: no Paresthesias: no Glucose Monitoring: no  Accucheck frequency: daily  Fasting glucose: 130 to 232  Post prandial:  Evening:  Before meals: Taking Insulin?: no  Long acting insulin:  Short acting insulin: Blood Pressure Monitoring: not checking Retinal Examination: Up to Date Foot Exam: Up to Date Pneumovax: Up to Date Influenza:  refuses Aspirin: no   HYPERTENSION / HYPERLIPIDEMIA Taking Metoprolol, HCTZ, and Losartan daily. Last saw cardiology on 07/02/21 for CAD and PSVT.   Satisfied with current treatment? yes Duration of hypertension: chronic BP monitoring frequency: daily BP range: 120/70 range BP medication side effects: no Duration of hyperlipidemia: chronic Aspirin: no Recent stressors: no Recurrent headaches: no Visual changes: no Palpitations: no Dyspnea: no Chest pain: no Lower extremity edema: no Dizzy/lightheaded: no  The ASCVD  Risk score (Arnett DK, et al., 2019) failed to calculate for the following reasons:   The patient has a prior MI or stroke diagnosis   BPH Continues on Toviaz, Vesicare, and Tamsulosin.  He would like to try reducing Toviaz as finds recently not urinating as much.  Last saw urology 01/09/22.   BPH status: stable Satisfied with current treatment?: yes Medication side effects: no Medication compliance: good compliance Duration: chronic Nocturia: 1-2x per night Urinary frequency:yes Incomplete voiding: no Urgency: yes Weak urinary stream: yes Straining to start stream: yes Dysuria: no Onset: gradual Severity: moderate Alleviating factors: none Aggravating factors: unknown Treatments attempted: as above IPSS Questionnaire (AUA-7): 21 AUA Over the past month.   1)  How often have you had a sensation of not emptying your bladder completely after you finish urinating?  3 - About half the time  2)  How often have you had to urinate again less than two hours after you finished urinating? 3 - About half the time  3)  How often have you found you stopped and started again several times when you urinated?  3 - About half the time  4) How difficult have you found it to postpone urination?  3 - About half the time  5) How often have you had a weak urinary stream?  3 - About half the time  6) How often have you had to push or strain to begin urination?  2 - Less than half the time  7)  How many times did you most typically get up to urinate from the time you went to bed until the time you got up in the morning?  4 - 4 times  Total score:  0-7 mildly symptomatic   8-19 moderately symptomatic   20-35 severely symptomatic     Relevant past medical, surgical, family and social history reviewed and updated as indicated. Interim medical history since our last visit reviewed. Allergies and medications reviewed and updated.  Review of Systems  Constitutional:  Negative for activity change,  diaphoresis, fatigue and fever.  Respiratory:  Negative for cough, chest tightness, shortness of breath and wheezing.   Cardiovascular:  Negative for chest pain, palpitations and leg swelling.  Gastrointestinal: Negative.   Endocrine: Negative for polydipsia, polyphagia and polyuria.  Neurological:  Positive for numbness (occasional). Negative for dizziness, syncope, weakness, light-headedness and headaches.  Psychiatric/Behavioral: Negative.     Per HPI unless specifically indicated above     Objective:    BP 136/80 (BP Location: Left Arm, Patient Position: Sitting, Cuff Size: Normal)   Pulse 76   Temp 98 F (36.7 C) (Oral)   Ht 6' 2.02" (1.88 m)   Wt 191 lb 8 oz (86.9 kg)   SpO2 97%   BMI 24.58 kg/m   Wt Readings from Last 3 Encounters:  03/12/22 191 lb 8 oz (86.9 kg)  01/10/22 194 lb 12.8 oz (88.4 kg)  12/26/21 197 lb 4.8 oz (89.5 kg)    Physical Exam Vitals and nursing note reviewed.  Constitutional:      General: He is awake. He is not in acute distress.    Appearance: He is well-developed and well-groomed. He is not ill-appearing or toxic-appearing.  HENT:     Head: Normocephalic and atraumatic. No abrasion, contusion or laceration.     Right Ear: Hearing normal. No drainage.     Left Ear: Hearing normal. No drainage.  Eyes:     General: Lids are normal.        Right eye: No discharge.        Left eye: No discharge.     Extraocular Movements: Extraocular movements intact.     Conjunctiva/sclera: Conjunctivae normal.     Pupils: Pupils are equal, round, and reactive to light.     Visual Fields: Right eye visual fields normal and left eye visual fields normal.  Neck:     Thyroid: No thyromegaly.     Vascular: No carotid bruit.     Trachea: Trachea normal.  Cardiovascular:     Rate and Rhythm: Normal rate and regular rhythm.     Heart sounds: Normal heart sounds, S1 normal and S2 normal. No murmur heard.    No gallop.  Pulmonary:     Effort: Pulmonary effort is  normal. No accessory muscle usage or respiratory distress.     Breath sounds: Normal breath sounds.  Abdominal:     General: Bowel sounds are normal. There is no distension.     Palpations: Abdomen is soft.     Tenderness: There is no abdominal tenderness.  Musculoskeletal:        General: Normal range of motion.     Cervical back: Normal range of motion and neck supple.     Right lower leg: No edema.     Left lower leg: No edema.  Lymphadenopathy:     Cervical: No cervical adenopathy.  Skin:    General: Skin is warm and dry.     Capillary Refill: Capillary refill  takes less than 2 seconds.     Findings: No rash.  Neurological:     Mental Status: He is alert and oriented to person, place, and time.     Cranial Nerves: Cranial nerves 2-12 are intact.     Motor: Motor function is intact.     Coordination: Coordination is intact.     Gait: Gait is intact.     Deep Tendon Reflexes: Reflexes are normal and symmetric.     Reflex Scores:      Brachioradialis reflexes are 2+ on the right side and 2+ on the left side.      Patellar reflexes are 2+ on the right side and 2+ on the left side. Psychiatric:        Attention and Perception: Attention normal.        Mood and Affect: Mood normal.        Speech: Speech normal.        Behavior: Behavior normal. Behavior is cooperative.        Thought Content: Thought content normal.    Results for orders placed or performed during the hospital encounter of 12/24/21  CBC with Differential  Result Value Ref Range   WBC 5.7 4.0 - 10.5 K/uL   RBC 4.45 4.22 - 5.81 MIL/uL   Hemoglobin 15.4 13.0 - 17.0 g/dL   HCT 44.1 39.0 - 52.0 %   MCV 99.1 80.0 - 100.0 fL   MCH 34.6 (H) 26.0 - 34.0 pg   MCHC 34.9 30.0 - 36.0 g/dL   RDW 13.4 11.5 - 15.5 %   Platelets 87 (L) 150 - 400 K/uL   nRBC 0.0 0.0 - 0.2 %   Neutrophils Relative % 57 %   Neutro Abs 3.3 1.7 - 7.7 K/uL   Lymphocytes Relative 28 %   Lymphs Abs 1.6 0.7 - 4.0 K/uL   Monocytes Relative 12  %   Monocytes Absolute 0.7 0.1 - 1.0 K/uL   Eosinophils Relative 2 %   Eosinophils Absolute 0.1 0.0 - 0.5 K/uL   Basophils Relative 1 %   Basophils Absolute 0.0 0.0 - 0.1 K/uL   Immature Granulocytes 0 %   Abs Immature Granulocytes 0.01 0.00 - 0.07 K/uL  Comprehensive metabolic panel  Result Value Ref Range   Sodium 131 (L) 135 - 145 mmol/L   Potassium 3.8 3.5 - 5.1 mmol/L   Chloride 90 (L) 98 - 111 mmol/L   CO2 28 22 - 32 mmol/L   Glucose, Bld 400 (H) 70 - 99 mg/dL   BUN 9 6 - 20 mg/dL   Creatinine, Ser 1.03 0.61 - 1.24 mg/dL   Calcium 10.0 8.9 - 10.3 mg/dL   Total Protein 8.1 6.5 - 8.1 g/dL   Albumin 3.5 3.5 - 5.0 g/dL   AST 110 (H) 15 - 41 U/L   ALT 68 (H) 0 - 44 U/L   Alkaline Phosphatase 232 (H) 38 - 126 U/L   Total Bilirubin 3.2 (H) 0.3 - 1.2 mg/dL   GFR, Estimated >60 >60 mL/min   Anion gap 13 5 - 15  Urinalysis, Routine w reflex microscopic Urine, Clean Catch  Result Value Ref Range   Color, Urine YELLOW (A) YELLOW   APPearance CLEAR (A) CLEAR   Specific Gravity, Urine 1.030 1.005 - 1.030   pH 6.0 5.0 - 8.0   Glucose, UA >=500 (A) NEGATIVE mg/dL   Hgb urine dipstick NEGATIVE NEGATIVE   Bilirubin Urine NEGATIVE NEGATIVE   Ketones, ur NEGATIVE NEGATIVE mg/dL  Protein, ur NEGATIVE NEGATIVE mg/dL   Nitrite NEGATIVE NEGATIVE   Leukocytes,Ua TRACE (A) NEGATIVE   RBC / HPF 0-5 0 - 5 RBC/hpf   WBC, UA 0-5 0 - 5 WBC/hpf   Bacteria, UA RARE (A) NONE SEEN   Squamous Epithelial / LPF 0-5 0 - 5   Mucus PRESENT   POC CBG, ED  Result Value Ref Range   Glucose-Capillary 411 (H) 70 - 99 mg/dL      Assessment & Plan:   Problem List Items Addressed This Visit       Cardiovascular and Mediastinum   Hypertension associated with diabetes (HCC)    Chronic, ongoing.  Initial BP elevated and repeat at goal -- at goal on home checks.  Recommend he monitor BP at least a few mornings a week at home and document.  DASH diet at home.  Continue current medication regimen and adjust  as needed.  Labs today: CMP.  Return in 3 months.       Relevant Medications   Dulaglutide (TRULICITY) 1.5 VV/6.1YW SOPN   hydrochlorothiazide (HYDRODIURIL) 25 MG tablet   Other Relevant Orders   Bayer DCA Hb A1c Waived   Comprehensive metabolic panel     Digestive   Hepatocellular carcinoma (HCC)    Chronic, followed by oncology, continue this collaboration.  Recent notes reviewed.        Endocrine   Hyperlipidemia associated with type 2 diabetes mellitus (HCC)    Chronic, ongoing.  They report no current statin due to liver cancer.  Lipid panel today.         Relevant Medications   Dulaglutide (TRULICITY) 1.5 VP/7.1GG SOPN   hydrochlorothiazide (HYDRODIURIL) 25 MG tablet   Other Relevant Orders   Bayer DCA Hb A1c Waived   Comprehensive metabolic panel   Lipid Panel w/o Chol/HDL Ratio   Type 2 diabetes mellitus with diabetic neuropathy, without long-term current use of insulin (HCC) - Primary    Chronic, ongoing with A1c trending down to 2.6% with Trulicity + noticing reduction in alcohol cravings and urine ALB 08 December 2021.  Continue Jardiance and Metformin to 1000 MG daily + increase Trulicity to 1.5 MG weekly. Recommend they check blood sugar daily and document + focus heavily on diabetic diet at home.   - Continue ACE for kidney protection  - Consider statin therapy in future, monitor closely with liver - Eye exam up to date - Foot exam up to date - Pneumococcal up to date - Recheck A1c in 3 months.      Relevant Medications   Dulaglutide (TRULICITY) 1.5 RS/8.5IO SOPN   Other Relevant Orders   Bayer DCA Hb A1c Waived   Comprehensive metabolic panel     Nervous and Auditory   Cerebral atrophy (HCC)    Noted on CT imaging -- continue to monitor memory.      Nicotine dependence, cigarettes, w unsp disorders    I have recommended complete cessation of tobacco use. I have discussed various options available for assistance with tobacco cessation including over  the counter methods (Nicotine gum, patch and lozenges). We also discussed prescription options (Chantix, Nicotine Inhaler / Nasal Spray). The patient is not interested in pursuing any prescription tobacco cessation options at this time.         Genitourinary   BPH with obstruction/lower urinary tract symptoms    Chronic, ongoing, followed by urology.  Continue this collaboration + current medication regimen.  PSA on labs today.  Other   Alcohol abuse    Ongoing, recommend continued cut back on alcohol use.  Discussed effect of alcohol on blood pressure and diabetes.      Other Visit Diagnoses     Benign prostatic hyperplasia without lower urinary tract symptoms       PSA on labs today   Relevant Orders   PSA        Follow up plan: Return in about 3 months (around 06/11/2022) for T2DM, HTN/HLD, LIVER CA, BPH.

## 2022-03-12 NOTE — Assessment & Plan Note (Signed)
Chronic, ongoing, followed by urology.  Continue this collaboration + current medication regimen.  PSA on labs today.

## 2022-03-12 NOTE — Assessment & Plan Note (Signed)
Chronic, followed by oncology, continue this collaboration.  Recent notes reviewed.

## 2022-03-12 NOTE — Assessment & Plan Note (Addendum)
Chronic, ongoing with A1c trending down to 6.7% with Trulicity + noticing reduction in alcohol cravings and urine ALB 08 December 2021.  Continue Jardiance and Metformin to 1000 MG daily + increase Trulicity to 1.5 MG weekly. Recommend they check blood sugar daily and document + focus heavily on diabetic diet at home.   - Continue ACE for kidney protection  - Consider statin therapy in future, monitor closely with liver - Eye exam up to date - Foot exam up to date - Pneumococcal up to date - Recheck A1c in 3 months.

## 2022-03-12 NOTE — Assessment & Plan Note (Signed)
Ongoing, recommend continued cut back on alcohol use.  Discussed effect of alcohol on blood pressure and diabetes.

## 2022-03-12 NOTE — Assessment & Plan Note (Signed)
Chronic, ongoing.  They report no current statin due to liver cancer.  Lipid panel today.

## 2022-03-12 NOTE — Assessment & Plan Note (Signed)
I have recommended complete cessation of tobacco use. I have discussed various options available for assistance with tobacco cessation including over the counter methods (Nicotine gum, patch and lozenges). We also discussed prescription options (Chantix, Nicotine Inhaler / Nasal Spray). The patient is not interested in pursuing any prescription tobacco cessation options at this time.  

## 2022-03-12 NOTE — Assessment & Plan Note (Signed)
Noted on CT imaging -- continue to monitor memory.

## 2022-03-12 NOTE — Assessment & Plan Note (Signed)
Chronic, ongoing.  Initial BP elevated and repeat at goal -- at goal on home checks.  Recommend he monitor BP at least a few mornings a week at home and document.  DASH diet at home.  Continue current medication regimen and adjust as needed.  Labs today: CMP.  Return in 3 months.

## 2022-03-13 LAB — COMPREHENSIVE METABOLIC PANEL
ALT: 69 IU/L — ABNORMAL HIGH (ref 0–44)
AST: 117 IU/L — ABNORMAL HIGH (ref 0–40)
Albumin/Globulin Ratio: 0.9 — ABNORMAL LOW (ref 1.2–2.2)
Albumin: 3.2 g/dL — ABNORMAL LOW (ref 3.8–4.9)
Alkaline Phosphatase: 210 IU/L — ABNORMAL HIGH (ref 44–121)
BUN/Creatinine Ratio: 9 (ref 9–20)
BUN: 8 mg/dL (ref 6–24)
Bilirubin Total: 3.3 mg/dL — ABNORMAL HIGH (ref 0.0–1.2)
CO2: 23 mmol/L (ref 20–29)
Calcium: 9.3 mg/dL (ref 8.7–10.2)
Chloride: 98 mmol/L (ref 96–106)
Creatinine, Ser: 0.9 mg/dL (ref 0.76–1.27)
Globulin, Total: 3.6 g/dL (ref 1.5–4.5)
Glucose: 209 mg/dL — ABNORMAL HIGH (ref 70–99)
Potassium: 4.2 mmol/L (ref 3.5–5.2)
Sodium: 135 mmol/L (ref 134–144)
Total Protein: 6.8 g/dL (ref 6.0–8.5)
eGFR: 99 mL/min/{1.73_m2} (ref >59)

## 2022-03-13 LAB — LIPID PANEL W/O CHOL/HDL RATIO
Cholesterol, Total: 150 mg/dL (ref 100–199)
HDL: 69 mg/dL (ref >39)
LDL Chol Calc (NIH): 60 mg/dL (ref 0–99)
Triglycerides: 117 mg/dL (ref 0–149)
VLDL Cholesterol Cal: 21 mg/dL (ref 5–40)

## 2022-03-13 LAB — PSA: Prostate Specific Ag, Serum: 0.1 ng/mL (ref 0.0–4.0)

## 2022-03-13 NOTE — Progress Notes (Signed)
Good afternoon, please let Johnny Kelley know labs have returned: - Kidney function normal, but liver function, as expected, continues to have some elevations in levels.   - Cholesterol levels remain stable, we will continue to monitor these. - Prostate lab is normal.  Continue with increase of Trulicity as we discussed and if any questions let me know.  Merry Christmas!! Keep being wonderful!!  Thank you for allowing me to participate in your care.  I appreciate you. Kindest regards, Talise Sligh

## 2022-03-14 ENCOUNTER — Telehealth: Payer: Self-pay

## 2022-03-14 NOTE — Telephone Encounter (Signed)
PA started for Trulicity 1.'5mg'$   through [employer] meds. Awaiting on determination

## 2022-03-17 ENCOUNTER — Encounter: Payer: Self-pay | Admitting: Podiatry

## 2022-03-17 ENCOUNTER — Ambulatory Visit: Payer: Medicaid Other | Admitting: Podiatry

## 2022-03-17 DIAGNOSIS — B351 Tinea unguium: Secondary | ICD-10-CM

## 2022-03-17 DIAGNOSIS — E1140 Type 2 diabetes mellitus with diabetic neuropathy, unspecified: Secondary | ICD-10-CM | POA: Diagnosis not present

## 2022-03-17 DIAGNOSIS — M79674 Pain in right toe(s): Secondary | ICD-10-CM | POA: Diagnosis not present

## 2022-03-17 DIAGNOSIS — M79675 Pain in left toe(s): Secondary | ICD-10-CM

## 2022-03-17 NOTE — Progress Notes (Signed)
This patient returns to my office for at risk foot care.  This patient requires this care by a professional since this patient will be at risk due to having diabetes.  This patient is unable to cut nails himself since the patient cannot reach his nails.These nails are painful walking and wearing shoes.  This patient presents for at risk foot care today.  General Appearance  Alert, conversant and in no acute stress.  Vascular  Dorsalis pedis and posterior tibial  pulses are palpable  bilaterally.  Capillary return is within normal limits  bilaterally. Temperature is within normal limits  bilaterally.  Neurologic  Senn-Weinstein monofilament wire test within normal limits  bilaterally. Muscle power within normal limits bilaterally.  Nails Thick disfigured discolored nails with subungual debris  hallux  bilaterally. No evidence of bacterial infection or drainage bilaterally.  Orthopedic  No limitations of motion  feet .  No crepitus or effusions noted.  No bony pathology or digital deformities noted.  Skin  normotropic skin with no porokeratosis noted bilaterally.  No signs of infections or ulcers noted.     Onychomycosis  Pain in right toes  Pain in left toes  Consent was obtained for treatment procedures.   Mechanical debridement of nails 1-5  bilaterally performed with a nail nipper.  Filed with dremel without incident.    Return office visit    3 months                 Told patient to return for periodic foot care and evaluation due to potential at risk complications.   River Ambrosio DPM   

## 2022-03-18 DIAGNOSIS — M2560 Stiffness of unspecified joint, not elsewhere classified: Secondary | ICD-10-CM | POA: Diagnosis not present

## 2022-03-18 DIAGNOSIS — M545 Low back pain, unspecified: Secondary | ICD-10-CM | POA: Diagnosis not present

## 2022-03-25 ENCOUNTER — Ambulatory Visit: Payer: Medicaid Other | Admitting: Nurse Practitioner

## 2022-04-01 ENCOUNTER — Other Ambulatory Visit: Payer: Self-pay

## 2022-04-01 MED ORDER — METOPROLOL SUCCINATE ER 25 MG PO TB24: 25.0000 mg | ORAL_TABLET | Freq: Every day | ORAL | 0 refills | Status: DC

## 2022-04-03 NOTE — Progress Notes (Signed)
Cardiology Office Note    Date:  04/04/2022   ID:  Johnny Kelley, DOB 1965/06/22, MRN [id number]  PCP:  Venita Lick, NP  Cardiologist:  Kate Sable, MD  Electrophysiologist:  None   Chief Complaint: Follow-up  History of Present Illness:   Johnny Kelley is a 57 y.o. male with history of CAD involving the RCA by CT imaging in 2022, aortic atherosclerosis, paroxysmal SVT, CVA in 2020, DM, hepatitis C/hepatocellular carcinoma with cirrhosis, HTN, lumbar spinal stenosis, and ongoing tobacco use of 40+ years who presents for follow-up of CAD and PSVT.  Echo from 08/2018 demonstrated an EF of 50 to 55%, basal mid inferior wall and inferior lateral wall hypokinesis, diastolic dysfunction, normal RV systolic function and ventricular cavity size, mild biatrial enlargement, moderate mitral annular calcification, aortic valve sclerosis, and normal size and structure aortic root.  He established care with Dr. Garen Lah in 05/2021 for evaluation of irregular heartbeat noted at PCPs office.  He was without symptoms of palpitations, angina, or decompensation.  Zio patch at that time showed a predominant rhythm of sinus with an average rate of 77 bpm (range 52 to 190 bpm), 3 episodes of NSVT occurred with the fastest interval lasting 4 beats with a maximum rate of 146 and the longest interval lasting 6 beats with an average rate of 116 bpm, 4 episodes of SVT occurred the fastest and longest interval lasting 14 beats with a maximum rate of 190 bpm, rare PACs, atrial couplets, atrial triplets occasional PVCs with rare ventricular couplets and triplets noted.  Echo on 06/24/2021 demonstrated an EF of 60 to 65%, no regional wall motion abnormalities, mild LVH, grade 1 diastolic dysfunction, normal RV systolic function and ventricular cavity size, moderately elevated PASP estimated at 47 mmHg, aortic valve sclerosis without evidence of stenosis, and an estimated right atrial pressure of 3 mmHg.   Toprol-XL was initiated.  He was last seen in our office in 06/2021, noting an improvement in palpitations on beta-blocker.  Blood pressure was improved following prior titration of HCTZ and addition of metoprolol.  He continued to smoke and was taking clopidogrel.  He comes in accompanied by his brother today and is doing very well from a cardiac perspective.  He is without symptoms of angina or decompensation.  He has chronic stable dyspnea which he attributes to ongoing tobacco use and increased exertion required to ambulate following his CVA.  No presyncope or syncope.  No falls since he was last seen.  Tolerating metoprolol.  Overall he feels like he is doing well from a cardiac perspective and does not have any cardiac issues at this time.   Labs independently reviewed: 02/2022 - TC 150, TG 117, HDL 69, LDL 60, BUN 8, serum creatinine 0.9, potassium 4.2, albumin 3.2, AST 117, ALT 69, A1c 8.2 01/2022 - Hgb 15.4, PLT 73 11/2021 - TSH normal  Past Medical History:  Diagnosis Date   Cancer, hepatocellular ([place])    Diabetes mellitus without complication ([place])    Hepatitis C    Hypertension    Psoriasis    Stroke ([place])    Hx stroke in 2020   Substance abuse [facility])     Past Surgical History:  Procedure Laterality Date   IR RADIOLOGIST EVAL & MGMT  01/19/2020   IR RADIOLOGIST EVAL & MGMT  01/31/2020   IR RADIOLOGIST EVAL & MGMT  04/12/2020   IR RADIOLOGIST EVAL & MGMT  08/30/2020   KNEE SURGERY Right  Current Medications: Current Meds  Medication Sig   blood glucose meter kit and supplies KIT Dispense based on patient and insurance preference. Use up to four times daily as directed. (FOR ICD-9 250.00, 250.01).   clopidogrel (PLAVIX) 75 MG tablet clopidogrel 75 mg tablet  TAKE 1 TABLET BY MOUTH EVERY DAY   clotrimazole (LOTRIMIN) 1 % cream clotrimazole 1 % topical cream   Dulaglutide (TRULICITY) 1.5 PI/9.5JO SOPN Inject 1.5 mg into the skin once a week.   famotidine (PEPCID) 20 MG  tablet TAKE 1 TABLET BY MOUTH EVERYDAY AT BEDTIME   gabapentin (NEURONTIN) 100 MG capsule Take 1 capsule (100 mg total) by mouth daily.   hydrochlorothiazide (HYDRODIURIL) 25 MG tablet Take 1 tablet (25 mg total) by mouth daily.   lidocaine (LIDODERM) 5 % Place 1 patch onto the skin daily. Remove & Discard patch within 12 hours or as directed by MD   losartan (COZAAR) 100 MG tablet Take 1 tablet (100 mg total) by mouth daily.   meloxicam (MOBIC) 7.5 MG tablet Take 7.5 mg by mouth daily.   metFORMIN (GLUCOPHAGE) 1000 MG tablet Take 1 tablet (1,000 mg total) by mouth daily.   metoprolol succinate (TOPROL XL) 25 MG 24 hr tablet Take 1 tablet (25 mg total) by mouth daily. PLEASE SCHEDULE APPOINTMENT FOR FURTHER REFILLS. THANK YOU!   solifenacin (VESICARE) 10 MG tablet solifenacin 10 mg tablet  TAKE 1 TABLET (10 MG TOTAL) BY MOUTH DAILY. MAY CAUSE CONSTIPATION/DRY MOUTH.   tamsulosin (FLOMAX) 0.4 MG CAPS capsule Take 1 capsule (0.4 mg total) by mouth daily.   terbinafine (LAMISIL) 250 MG tablet Take 1 tablet (250 mg total) by mouth daily.   tiZANidine (ZANAFLEX) 4 MG tablet Take 4 mg by mouth every 6 (six) hours as needed.   TOVIAZ 8 MG TB24 tablet Take 8 mg by mouth daily.    Allergies:   Patient has no known allergies.   Social History   Socioeconomic History   Marital status: Unknown    Spouse name: Not on file   Number of children: Not on file   Years of education: Not on file   Highest education level: Not on file  Occupational History   Occupation: unemployed  Tobacco Use   Smoking status: Every Day    Packs/day: 0.10    Years: 38.00    Total pack years: 3.80    Types: Cigarettes   Smokeless tobacco: Never  Vaping Use   Vaping Use: Never used  Substance and Sexual Activity   Alcohol use: Yes    Alcohol/week: 21.0 standard drinks of alcohol    Types: 21 Cans of beer per week    Comment: 2-3 beers a day   Drug use: Not Currently    Frequency: 1.0 times per week    Types:  Marijuana, Cocaine    Comment: reports marijuana use once per week and cocaine use 1-2 monthper month   Sexual activity: Not Currently  Other Topics Concern   Not on file  Social History Narrative   Not on file   Social Determinants of Health   Financial Resource Strain: Not on file  Food Insecurity: Not on file  Transportation Needs: No Transportation Needs (12/01/2019)   PRAPARE - Hydrologist (Medical): No    Lack of Transportation (Non-Medical): No  Physical Activity: Not on file  Stress: Stress Concern Present (12/01/2019)   [place]    Feeling of Stress :  To some extent  Social Connections: Socially Isolated (12/01/2019)   Social Connection and Isolation Panel [NHANES]    Frequency of Communication with Friends and Family: Never    Frequency of Social Gatherings with Friends and Family: More than three times a week    Attends Religious Services: Never    Marine scientist or Organizations: No    Attends Music therapist: Never    Marital Status: Divorced     Family History:  The patient's family history includes COPD in his paternal grandfather; Colon cancer in his father; Diabetes in his father and mother; Hyperlipidemia in his paternal grandmother; Kidney disease in his maternal grandmother; Lung cancer in his maternal grandfather.  ROS:   12-point review of systems is negative unless otherwise noted in HPI.   EKGs/Labs/Other Studies Reviewed:    Studies reviewed were summarized above. The additional studies were reviewed today:  Zio patch 05/2021: Patient had a min HR of 52 bpm, max HR of 190 bpm, and avg HR of 77 bpm. Predominant underlying rhythm was Sinus Rhythm. Slight P wave morphology changes were noted. 3 Ventricular Tachycardia runs occurred, the run with the fastest interval lasting 4  beats with a max rate of 146 bpm, the longest lasting 6 beats with  an avg rate of 116 bpm. 4 Supraventricular Tachycardia runs occurred, the run with the fastest interval lasting 14 beats with a max rate of 190 bpm, the longest lasting 9 beats with an  avg rate of 98 bpm. Isolated SVEs were rare (<1.0%), SVE Couplets were rare (<1.0%), and SVE Triplets were rare (<1.0%). Isolated VEs were occasional (4.5%, 69350), VE Couplets were rare (<1.0%, 600), and VE Triplets were rare (<1.0%, 50). Ventricular  Bigeminy and Trigeminy were present.     Occasional paroxysmal SVT, nonsustained VT lasting 4 beats noted.  __________  2D echo 06/24/2021: 1. Left ventricular ejection fraction, by estimation, is 60 to 65%. The  left ventricle has normal function. The left ventricle has no regional  wall motion abnormalities. There is mild left ventricular hypertrophy.  Left ventricular diastolic parameters  are consistent with Grade I diastolic dysfunction (impaired relaxation).  The average left ventricular global longitudinal strain is -19.2 %. The  global longitudinal strain is normal.   2. Right ventricular systolic function is normal. The right ventricular  size is normal. There is moderately elevated pulmonary artery systolic  pressure. The estimated right ventricular systolic pressure is 51.0 mmHg.   3. The mitral valve is normal in structure. No evidence of mitral valve  regurgitation. No evidence of mitral stenosis.   4. The aortic valve is normal in structure. Aortic valve regurgitation is  not visualized. Aortic valve sclerosis is present, with no evidence of  aortic valve stenosis.   5. The inferior vena cava is normal in size with greater than 50%  respiratory variability, suggesting right atrial pressure of 3 mmHg.   Comparison(s): LVEF 50-55%.  __________  2D echo 09/24/2018: 1. Moderate hypokinesis of the left ventricular, basal-mid inferior wall  and inferolateral wall.   2. The left ventricle has low normal systolic function, with an ejection   fraction of 50-55%. The cavity size was normal. There is moderately  increased left ventricular wall thickness. Left ventricular diastolic  Doppler parameters are consistent with  impaired relaxation. Elevated mean left atrial pressure.   3. The right ventricle has normal systolic function. The cavity was  normal. There is no increase in right ventricular wall thickness. Right  ventricular systolic pressure could not be assessed.   4. Left atrial size was mildly dilated.   5. Right atrial size was mildly dilated.   6. The mitral valve is grossly normal. There is moderate mitral annular  calcification present.   7. The aortic valve was not well visualized. Moderate thickening of the  aortic valve.   8. The aortic root is normal in size and structure.   9. The interatrial septum appears to be lipomatous.    EKG:  EKG is ordered today.  The EKG ordered today demonstrates NSR, 73 bpm, rare PVC, no acute ST-T changes  Recent Labs: 12/10/2021: TSH 1.700 12/24/2021: Hemoglobin 15.4; Platelets 87 03/12/2022: ALT 69; BUN 8; Creatinine, Ser 0.90; Potassium 4.2; Sodium 135  Recent Lipid Panel    Component Value Date/Time   CHOL 150 03/12/2022 0842   TRIG 117 03/12/2022 0842   HDL 69 03/12/2022 0842   CHOLHDL 2.0 05/01/2021 0840   CHOLHDL 2.1 09/25/2018 0406   VLDL 13 09/25/2018 0406   LDLCALC 60 03/12/2022 0842    PHYSICAL EXAM:    VS:  BP 110/64 (BP Location: Left Arm, Patient Position: Sitting, Cuff Size: Normal)   Pulse 73   Ht '6\' 1"'$  (1.854 m)   Wt 190 lb (86.2 kg)   SpO2 96%   BMI 25.07 kg/m   BMI: Body mass index is 25.07 kg/m.  Physical Exam Vitals reviewed.  Constitutional:      Appearance: He is well-developed.  HENT:     Head: Normocephalic and atraumatic.  Eyes:     General:        Right eye: No discharge.        Left eye: No discharge.  Neck:     Vascular: No JVD.  Cardiovascular:     Rate and Rhythm: Normal rate and regular rhythm.     Pulses:           Posterior tibial pulses are 2+ on the right side and 2+ on the left side.     Heart sounds: Normal heart sounds, S1 normal and S2 normal. Heart sounds not distant. No midsystolic click and no opening snap. No murmur heard.    No friction rub.  Pulmonary:     Effort: Pulmonary effort is normal. No respiratory distress.     Breath sounds: Normal breath sounds. No decreased breath sounds, wheezing or rales.  Chest:     Chest wall: No tenderness.  Abdominal:     General: There is no distension.  Musculoskeletal:     Cervical back: Normal range of motion.     Right lower leg: No edema.     Left lower leg: No edema.  Skin:    General: Skin is warm and dry.     Nails: There is no clubbing.  Neurological:     Mental Status: He is alert and oriented to person, place, and time.  Psychiatric:        Speech: Speech normal.        Behavior: Behavior normal.        Thought Content: Thought content normal.        Judgment: Judgment normal.     Wt Readings from Last 3 Encounters:  04/04/22 190 lb (86.2 kg)  03/12/22 191 lb 8 oz (86.9 kg)  01/10/22 194 lb 12.8 oz (88.4 kg)     ASSESSMENT & PLAN:   CAD involving the native coronary arteries: He is doing well and is without symptoms of angina  or decompensation.  Continue aggressive risk factor modification and primary prevention including clopidogrel, metoprolol, and losartan.  LDL 60, not on a statin with underlying liver dysfunction.  No indication for ischemic testing at this time.  Paroxysmal SVT/NSVT: Quiescent on Toprol-XL.  HTN: Blood pressure well controlled in the office today.  She remains on HCTZ, losartan and metoprolol succinate.  Stable renal function and electrolytes.   History of CVA: No new deficits.  He remains on Plavix per PCP/neurology.  Not on a statin with underlying liver disease.    History of hepatitis C/hepatocellular carcinoma/cirrhosis: Followed by oncology.  Tobacco use: Down to 3 cigarettes/day.  Not yet fully  ready to quit.  Encouraged nicotine patches and complete cessation.   Disposition: F/u with Dr. Garen Lah or an APP in 6 months.   Medication Adjustments/Labs and Tests Ordered: Current medicines are reviewed at length with the patient today.  Concerns regarding medicines are outlined above. Medication changes, Labs and Tests ordered today are summarized above and listed in the Patient Instructions accessible in Encounters.   Signed, Christell Faith, PA-C 04/04/2022 8:51 AM     [facility] [address] [place] [address] [place], [place] [address] [phone]

## 2022-04-04 ENCOUNTER — Encounter: Payer: Self-pay | Admitting: Physician Assistant

## 2022-04-04 ENCOUNTER — Ambulatory Visit: Payer: Medicaid Other | Attending: Physician Assistant | Admitting: Physician Assistant

## 2022-04-04 VITALS — BP 110/64 | HR 73 | Ht 73.0 in | Wt 190.0 lb

## 2022-04-04 DIAGNOSIS — Z8673 Personal history of transient ischemic attack (TIA), and cerebral infarction without residual deficits: Secondary | ICD-10-CM

## 2022-04-04 DIAGNOSIS — I4729 Other ventricular tachycardia: Secondary | ICD-10-CM

## 2022-04-04 DIAGNOSIS — I2510 Atherosclerotic heart disease of native coronary artery without angina pectoris: Secondary | ICD-10-CM

## 2022-04-04 DIAGNOSIS — Z720 Tobacco use: Secondary | ICD-10-CM | POA: Diagnosis not present

## 2022-04-04 DIAGNOSIS — I471 Supraventricular tachycardia, unspecified: Secondary | ICD-10-CM

## 2022-04-04 DIAGNOSIS — I10 Essential (primary) hypertension: Secondary | ICD-10-CM | POA: Diagnosis not present

## 2022-04-04 NOTE — Patient Instructions (Signed)
Medication Instructions:  No changes at this time.   *If you need a refill on your cardiac medications before your next appointment, please call your pharmacy*   Lab Work: None  If you have labs (blood work) drawn today and your tests are completely normal, you will receive your results only by: [place] (if you have MyChart) OR A paper copy in the mail If you have any lab test that is abnormal or we need to change your treatment, we will call you to review the results.   Testing/Procedures: None   Follow-Up: At [facility], you and your health needs are our priority.  As part of our continuing mission to provide you with exceptional heart care, we have created designated Provider Care Teams.  These Care Teams include your primary Cardiologist (physician) and Advanced Practice Providers (APPs -  Physician Assistants and Nurse Practitioners) who all work together to provide you with the care you need, when you need it.  We recommend signing up for the patient portal called "MyChart".  Sign up information is provided on this After Visit Summary.  MyChart is used to connect with patients for Virtual Visits (Telemedicine).  Patients are able to view lab/test results, encounter notes, upcoming appointments, etc.  Non-urgent messages can be sent to your provider as well.   To learn more about what you can do with MyChart, go to [email or web address].    Your next appointment:   6 month(s)  The format for your next appointment:   In Person  Provider:   Kate Sable, MD or Christell Faith, PA-C       Important Information About Sugar

## 2022-04-07 ENCOUNTER — Ambulatory Visit: Payer: Medicaid Other | Admitting: Nurse Practitioner

## 2022-04-10 ENCOUNTER — Ambulatory Visit (INDEPENDENT_AMBULATORY_CARE_PROVIDER_SITE_OTHER): Payer: Medicaid Other | Admitting: Physician Assistant

## 2022-04-10 ENCOUNTER — Encounter: Payer: Self-pay | Admitting: Physician Assistant

## 2022-04-10 VITALS — BP 145/78 | HR 87 | Temp 98.0°F | Ht 73.0 in | Wt 189.2 lb

## 2022-04-10 DIAGNOSIS — R519 Headache, unspecified: Secondary | ICD-10-CM

## 2022-04-10 DIAGNOSIS — E1159 Type 2 diabetes mellitus with other circulatory complications: Secondary | ICD-10-CM

## 2022-04-10 DIAGNOSIS — I152 Hypertension secondary to endocrine disorders: Secondary | ICD-10-CM

## 2022-04-10 MED ORDER — AMLODIPINE BESYLATE 2.5 MG PO TABS: 2.5000 mg | ORAL_TABLET | Freq: Every day | ORAL | 1 refills | Status: DC

## 2022-04-10 NOTE — Assessment & Plan Note (Signed)
Chronic, ongoing Concerned for exacerbation at this time as he reports blurry vision, headaches, dizziness for the past few weeks and BP is elevated above goal today after several rechecks Will try adding Amlodipine 2.5 mg PO QD to regimen to see if this improves symptoms and BP  Recommend he check BP daily - before taking meds and then 1-2 hours after for monitoring Follow up in 3-4 weeks to assess response to medication changes and recheck BP

## 2022-04-10 NOTE — Patient Instructions (Signed)
Your blood pressure was mildly elevated today.  If possible please take it at home using an electronic blood pressure cuff for the upper arm Record your blood pressure once per day and bring them back with you to your apt so we can make sure you are not developing high blood pressure.   Incorporating a minimum of 150 minutes (20-30 minutes per day) of moderate intensity physical activity can help improve your heart health and reduce the chances of high blood pressure and other cardiovascular risks. Incorporating a heart healthy diet can also help reduce the chances of heart attack and high cholesterol.

## 2022-04-10 NOTE — Assessment & Plan Note (Signed)
Acute, ongoing He reports he is waking up with persistent 8/10 headaches that improve to about 5/10 with Tylenol  PE was overall reassuring today but cannot rule out glaucoma, intracranial etiology  Will try adding Amlodipine 2.5 mg PO QD to improve BP and see if this improves symptoms as well If not improving may need to consider imaging of head and referral to Ophthalmology  Follow up in 3-4 weeks to assess response to medication regimen changes.

## 2022-04-10 NOTE — Progress Notes (Signed)
Acute Office Visit   Patient: Johnny Kelley   DOB: Jun 28, 1965   57 y.o. Male  MRN: [id number] Visit Date: 04/10/2022  Today's healthcare provider: Dani Gobble Caden Fatica, PA-C  Introduced myself to the patient as a Journalist, newspaper and provided education on APPs in clinical practice.    Chief Complaint  Patient presents with   Eye Problem   Headache    Patient says he is having pain behind his eye and causing headaches. Patient says it has been an ongoing issue for about a month or so. Patient says he usually takes Tylenol to help with pain. Patient says it makes him dizzy to where he cannot walk or barely see.    Dizziness   Subjective    Eye Problem  Associated symptoms include photophobia.  Headache  Associated symptoms include dizziness and photophobia.  Dizziness Associated symptoms include headaches.   HPI     Headache    Additional comments: Patient says he is having pain behind his eye and causing headaches. Patient says it has been an ongoing issue for about a month or so. Patient says he usually takes Tylenol to help with pain. Patient says it makes him dizzy to where he cannot walk or barely see.       Last edited by Irena Reichmann, CMA on 04/10/2022 10:18 AM.       Headaches and pain behind eyes  Onset: gradual  Duration: 2-3 weeks  Reports he gets the headaches first thing in the AM and seems to respond with Tylenol administration  Associated symptoms: causes dizziness and vision changes, reports he feels off balance while pain is present and has been using a walker to steady himself until Tylenol kicks in  Pain level and character: 8/10 max, right now it is about a 5/10  Interventions: Tylenol Alleviating: Tylenol seems to be beneficial  Aggravating: nothing to his knowledge  Feels like someone is driving a nail into temple near left eye - reports left side seems worse  Denies lacrimation or rhinorrhea with pain  BP is elevated today despite multiple  rechecks He reports he last went for eye exam about 8 months ago   He denies recent sinus symptoms or cold infection   Medications: Outpatient Medications Prior to Visit  Medication Sig   blood glucose meter kit and supplies KIT Dispense based on patient and insurance preference. Use up to four times daily as directed. (FOR ICD-9 250.00, 250.01).   clopidogrel (PLAVIX) 75 MG tablet clopidogrel 75 mg tablet  TAKE 1 TABLET BY MOUTH EVERY DAY   clotrimazole (LOTRIMIN) 1 % cream clotrimazole 1 % topical cream   Dulaglutide (TRULICITY) 1.5 NO/6.7EH SOPN Inject 1.5 mg into the skin once a week.   famotidine (PEPCID) 20 MG tablet TAKE 1 TABLET BY MOUTH EVERYDAY AT BEDTIME   gabapentin (NEURONTIN) 100 MG capsule Take 1 capsule (100 mg total) by mouth daily.   hydrochlorothiazide (HYDRODIURIL) 25 MG tablet Take 1 tablet (25 mg total) by mouth daily.   lidocaine (LIDODERM) 5 % Place 1 patch onto the skin daily. Remove & Discard patch within 12 hours or as directed by MD   losartan (COZAAR) 100 MG tablet Take 1 tablet (100 mg total) by mouth daily.   meloxicam (MOBIC) 7.5 MG tablet Take 7.5 mg by mouth daily.   metFORMIN (GLUCOPHAGE) 1000 MG tablet Take 1 tablet (1,000 mg total) by mouth daily.   metoprolol succinate (TOPROL XL) 25 MG  24 hr tablet Take 1 tablet (25 mg total) by mouth daily. PLEASE SCHEDULE APPOINTMENT FOR FURTHER REFILLS. THANK YOU!   solifenacin (VESICARE) 10 MG tablet solifenacin 10 mg tablet  TAKE 1 TABLET (10 MG TOTAL) BY MOUTH DAILY. MAY CAUSE CONSTIPATION/DRY MOUTH.   tamsulosin (FLOMAX) 0.4 MG CAPS capsule Take 1 capsule (0.4 mg total) by mouth daily.   terbinafine (LAMISIL) 250 MG tablet Take 1 tablet (250 mg total) by mouth daily.   tiZANidine (ZANAFLEX) 4 MG tablet Take 4 mg by mouth every 6 (six) hours as needed.   TOVIAZ 8 MG TB24 tablet Take 8 mg by mouth daily.   No facility-administered medications prior to visit.    Review of Systems  Eyes:  Positive for  photophobia and visual disturbance (Reports blurry vision).  Neurological:  Positive for dizziness, light-headedness and headaches.       Objective    BP (!) 145/78 (BP Location: Left Arm, Cuff Size: Normal)   Pulse 87   Temp 98 F (36.7 C) (Oral)   Ht '6\' 1"'$  (1.854 m)   Wt 189 lb 3.2 oz (85.8 kg)   SpO2 94%   BMI 24.96 kg/m    Physical Exam Vitals reviewed.  Constitutional:      Appearance: Normal appearance. He is well-developed.  HENT:     Head: Normocephalic and atraumatic.  Eyes:     General: Lids are normal. Gaze aligned appropriately. No allergic shiner or scleral icterus.       Right eye: No foreign body, discharge or hordeolum.        Left eye: No foreign body, discharge or hordeolum.     Extraocular Movements: Extraocular movements intact.     Right eye: Normal extraocular motion and no nystagmus.     Left eye: Normal extraocular motion and no nystagmus.     Conjunctiva/sclera: Conjunctivae normal.     Pupils: Pupils are equal, round, and reactive to light. Pupils are equal.     Right eye: Pupil is round, reactive and not sluggish.     Left eye: Pupil is round, reactive and not sluggish.     Comments: No temporal bruits to auscultation   Pulmonary:     Effort: Pulmonary effort is normal.  Musculoskeletal:     Cervical back: Normal range of motion and neck supple.  Skin:    General: Skin is warm and dry.  Neurological:     General: No focal deficit present.     Mental Status: He is alert and oriented to person, place, and time. Mental status is at baseline.  Psychiatric:        Mood and Affect: Mood normal.        Behavior: Behavior normal.        Thought Content: Thought content normal.        Judgment: Judgment normal.       No results found for any visits on 04/10/22.  Assessment & Plan      Return in about 4 weeks (around 05/08/2022) for BP check, headaches .      Problem List Items Addressed This Visit       Cardiovascular and Mediastinum    Hypertension associated with diabetes ([place])    Chronic, ongoing Concerned for exacerbation at this time as he reports blurry vision, headaches, dizziness for the past few weeks and BP is elevated above goal today after several rechecks Will try adding Amlodipine 2.5 mg PO QD to regimen to see if this improves symptoms  and BP  Recommend he check BP daily - before taking meds and then 1-2 hours after for monitoring Follow up in 3-4 weeks to assess response to medication changes and recheck BP       Relevant Medications   amLODipine (NORVASC) 2.5 MG tablet     Other   Persistent headaches - Primary    Acute, ongoing He reports he is waking up with persistent 8/10 headaches that improve to about 5/10 with Tylenol  PE was overall reassuring today but cannot rule out glaucoma, intracranial etiology  Will try adding Amlodipine 2.5 mg PO QD to improve BP and see if this improves symptoms as well If not improving may need to consider imaging of head and referral to Ophthalmology  Follow up in 3-4 weeks to assess response to medication regimen changes.       Relevant Medications   amLODipine (NORVASC) 2.5 MG tablet     Return in about 4 weeks (around 05/08/2022) for BP check, headaches .   I, Daijha Leggio E Cannan Beeck, PA-C, have reviewed all documentation for this visit. The documentation on 04/10/22 for the exam, diagnosis, procedures, and orders are all accurate and complete.   Talitha Givens, MHS, PA-C [facility]

## 2022-04-11 ENCOUNTER — Other Ambulatory Visit: Payer: Self-pay | Admitting: Nurse Practitioner

## 2022-04-11 DIAGNOSIS — E1140 Type 2 diabetes mellitus with diabetic neuropathy, unspecified: Secondary | ICD-10-CM

## 2022-04-11 NOTE — Telephone Encounter (Signed)
Requested Prescriptions  Pending Prescriptions Disp Refills   metFORMIN (GLUCOPHAGE) 1000 MG tablet [Pharmacy Med Name: METFORMIN HCL 1,000 MG TABLET] 90 tablet 0    Sig: TAKE 1 TABLET BY MOUTH EVERY DAY     Endocrinology:  Diabetes - Biguanides Failed - 04/11/2022  1:41 AM      Failed - HBA1C is between 0 and 7.9 and within 180 days    HB A1C (BAYER DCA - WAIVED)  Date Value Ref Range Status  03/12/2022 8.2 (H) 4.8 - 5.6 % Final    Comment:             Prediabetes: 5.7 - 6.4          Diabetes: >6.4          Glycemic control for adults with diabetes: <7.0          Failed - B12 Level in normal range and within 720 days    Vitamin B-12  Date Value Ref Range Status  12/23/2021 1,009 (H) 180 - 914 pg/mL Final    Comment:    (NOTE) This assay is not validated for testing neonatal or myeloproliferative syndrome specimens for Vitamin B12 levels. Performed at [facility], [address], [place], [place] [address]          Passed - Cr in normal range and within 360 days    Creatinine, Ser  Date Value Ref Range Status  03/12/2022 0.90 0.76 - 1.27 mg/dL Final         Passed - eGFR in normal range and within 360 days    GFR calc Af Amer  Date Value Ref Range Status  10/10/2019 >60 >60 mL/min Final   GFR, Estimated  Date Value Ref Range Status  12/24/2021 >60 >60 mL/min Final    Comment:    (NOTE) Calculated using the CKD-EPI Creatinine Equation (2021)    eGFR  Date Value Ref Range Status  03/12/2022 99 >59 mL/min/1.73 Final         Passed - Valid encounter within last 6 months    Recent Outpatient Visits           Yesterday Persistent headaches   [facility] Mecum, Erin E, PA-C   1 month ago Type 2 diabetes mellitus with diabetic neuropathy, without long-term current use of insulin ([place])   [place], Dallas T, NP   3 months ago Type 2 diabetes mellitus with diabetic neuropathy, without long-term current use of insulin ([place])    [place], Jolene T, NP   3 months ago Type 2 diabetes mellitus with diabetic neuropathy, without long-term current use of insulin ([place])   [facility] Mecum, Erin E, PA-C   4 months ago Type 2 diabetes mellitus with diabetic neuropathy, without long-term current use of insulin ([place])   [place], Barbaraann Faster, NP       Future Appointments             In 4 weeks [place], Barbaraann Faster, NP [facility]   In 2 months [place], Barbaraann Faster, NP [facility]   In 4 months Ralene Bathe, MD [facility] within normal limits and completed in the last 12 months    WBC  Date Value Ref Range Status  12/24/2021 5.7 4.0 - 10.5 K/uL Final   RBC  Date Value Ref  Range Status  12/24/2021 4.45 4.22 - 5.81 MIL/uL Final   Hemoglobin  Date Value Ref Range Status  12/24/2021 15.4 13.0 - 17.0 g/dL Final  05/01/2021 15.7 13.0 - 17.7 g/dL Final   HCT  Date Value Ref Range Status  12/24/2021 44.1 39.0 - 52.0 % Final   Hematocrit  Date Value Ref Range Status  05/01/2021 45.0 37.5 - 51.0 % Final   MCHC  Date Value Ref Range Status  12/24/2021 34.9 30.0 - 36.0 g/dL Final   [facility]  Date Value Ref Range Status  12/24/2021 34.6 (H) 26.0 - 34.0 pg Final   MCV  Date Value Ref Range Status  12/24/2021 99.1 80.0 - 100.0 fL Final  05/01/2021 96 79 - 97 fL Final   No results found for: "PLTCOUNTKUC", "LABPLAT", "POCPLA" RDW  Date Value Ref Range Status  12/24/2021 13.4 11.5 - 15.5 % Final  05/01/2021 12.9 11.6 - 15.4 % Final

## 2022-04-15 ENCOUNTER — Ambulatory Visit: Payer: Medicaid Other | Admitting: Nurse Practitioner

## 2022-04-17 DIAGNOSIS — N481 Balanitis: Secondary | ICD-10-CM | POA: Diagnosis not present

## 2022-04-17 DIAGNOSIS — R300 Dysuria: Secondary | ICD-10-CM | POA: Diagnosis not present

## 2022-04-17 DIAGNOSIS — R399 Unspecified symptoms and signs involving the genitourinary system: Secondary | ICD-10-CM | POA: Diagnosis not present

## 2022-04-17 DIAGNOSIS — A630 Anogenital (venereal) warts: Secondary | ICD-10-CM | POA: Diagnosis not present

## 2022-04-18 ENCOUNTER — Telehealth: Payer: Self-pay | Admitting: Oncology

## 2022-04-18 NOTE — Telephone Encounter (Signed)
Spoke with patient's brother to let him know that unfortunately PET scan on 1/22 has been cancelled due to authorization still pending.

## 2022-04-21 ENCOUNTER — Ambulatory Visit: Payer: Medicaid Other

## 2022-04-21 ENCOUNTER — Ambulatory Visit: Admission: RE | Admit: 2022-04-21 | Payer: Medicaid Other | Source: Ambulatory Visit

## 2022-04-22 DIAGNOSIS — M2560 Stiffness of unspecified joint, not elsewhere classified: Secondary | ICD-10-CM | POA: Diagnosis not present

## 2022-04-22 DIAGNOSIS — M545 Low back pain, unspecified: Secondary | ICD-10-CM | POA: Diagnosis not present

## 2022-04-23 ENCOUNTER — Inpatient Hospital Stay: Payer: Medicaid Other

## 2022-04-23 ENCOUNTER — Inpatient Hospital Stay: Payer: Medicaid Other | Admitting: Oncology

## 2022-04-28 ENCOUNTER — Other Ambulatory Visit: Payer: Self-pay

## 2022-04-28 MED ORDER — METOPROLOL SUCCINATE ER 25 MG PO TB24: 25.0000 mg | ORAL_TABLET | Freq: Every day | ORAL | 5 refills | Status: DC

## 2022-04-29 ENCOUNTER — Ambulatory Visit: Payer: Medicaid Other

## 2022-05-04 NOTE — Patient Instructions (Signed)
Form - Headache Record There are many types and causes of headaches. A headache record can help guide your treatment plan. Use this form to record the details. Bring this form with you to your follow-up visits. Follow your health care provider's instructions on how to describe your headache. You may be asked to: Use a pain scale. This is a tool to rate the intensity of your headache using words or numbers. Describe what your headache feels like, such as dull, achy, throbbing, or sharp. Headache record Date: _______________ Time (from start to end): ____________________ Location of the headache: _________________________ Intensity of the headache: ____________________ Description of the headache: ______________________________________________________________ Hours of sleep the night before the headache: __________ Food or drinks before the headache started: ______________________________________________________________________________________ Events before the headache started: _______________________________________________________________________________________________ Symptoms before the headache started: __________________________________________________________________________________________ Symptoms during the headache: __________________________________________________________________________________________________ Treatment: ________________________________________________________________________________________________________________ Effect of treatment: _________________________________________________________________________________________________________ Other comments: ___________________________________________________________________________________________________________ Date: _______________ Time (from start to end): ____________________ Location of the headache: _________________________ Intensity of the headache: ____________________ Description of the headache:  ______________________________________________________________ Hours of sleep the night before the headache: __________ Food or drinks before the headache started: ______________________________________________________________________________________ Events before the headache started: ____________________________________________________________________________________________ Symptoms before the headache started: _________________________________________________________________________________________ Symptoms during the headache: _______________________________________________________________________________________________ Treatment: ________________________________________________________________________________________________________________ Effect of treatment: _________________________________________________________________________________________________________ Other comments: ___________________________________________________________________________________________________________ Date: _______________ Time (from start to end): ____________________ Location of the headache: _________________________ Intensity of the headache: ____________________ Description of the headache: ______________________________________________________________ Hours of sleep the night before the headache: __________ Food or drinks before the headache started: ______________________________________________________________________________________ Events before the headache started: ____________________________________________________________________________________________ Symptoms before the headache started: _________________________________________________________________________________________ Symptoms during the headache: _______________________________________________________________________________________________ Treatment:  ________________________________________________________________________________________________________________ Effect of treatment: _________________________________________________________________________________________________________ Other comments: ___________________________________________________________________________________________________________ Date: _______________ Time (from start to end): ____________________ Location of the headache: _________________________ Intensity of the headache: ____________________ Description of the headache: ______________________________________________________________ Hours of sleep the night before the headache: _________ Food or drinks before the headache started: ______________________________________________________________________________________ Events before the headache started: ____________________________________________________________________________________________ Symptoms before the headache started: _________________________________________________________________________________________ Symptoms during the headache: _______________________________________________________________________________________________ Treatment: ________________________________________________________________________________________________________________ Effect of treatment: _________________________________________________________________________________________________________ Other comments: ___________________________________________________________________________________________________________ Date: _______________ Time (from start to end): ____________________ Location of the headache: _________________________ Intensity of the headache: ____________________ Description of the headache: ______________________________________________________________ Hours of sleep the night before the headache: _________ Food or drinks before the headache started:  ______________________________________________________________________________________ Events before the headache started: ____________________________________________________________________________________________ Symptoms before the headache started: _________________________________________________________________________________________ Symptoms during the headache: _______________________________________________________________________________________________ Treatment: ________________________________________________________________________________________________________________ Effect of treatment: _________________________________________________________________________________________________________ Other comments: ___________________________________________________________________________________________________________ This information is not intended to replace advice given to you by your health care provider. Make sure you discuss any questions you have with your health care provider. Document Revised: 08/15/2020 Document Reviewed: 08/15/2020 Elsevier Patient Education  [place].

## 2022-05-06 ENCOUNTER — Ambulatory Visit
Admission: RE | Admit: 2022-05-06 | Discharge: 2022-05-06 | Disposition: A | Discharge status: Home / Self Care | Payer: Medicaid Other | Source: Ambulatory Visit | Attending: Oncology | Admitting: Oncology

## 2022-05-06 DIAGNOSIS — I700 Atherosclerosis of aorta: Secondary | ICD-10-CM | POA: Diagnosis not present

## 2022-05-06 DIAGNOSIS — M47816 Spondylosis without myelopathy or radiculopathy, lumbar region: Secondary | ICD-10-CM | POA: Diagnosis not present

## 2022-05-06 DIAGNOSIS — C220 Liver cell carcinoma: Secondary | ICD-10-CM | POA: Diagnosis not present

## 2022-05-06 DIAGNOSIS — M48061 Spinal stenosis, lumbar region without neurogenic claudication: Secondary | ICD-10-CM | POA: Diagnosis not present

## 2022-05-06 DIAGNOSIS — K8020 Calculus of gallbladder without cholecystitis without obstruction: Secondary | ICD-10-CM | POA: Diagnosis not present

## 2022-05-06 DIAGNOSIS — K7460 Unspecified cirrhosis of liver: Secondary | ICD-10-CM | POA: Diagnosis not present

## 2022-05-06 MED ORDER — GADOBUTROL 1 MMOL/ML IV SOLN
7.5000 mL | Freq: Once | INTRAVENOUS | Status: AC | PRN
  Administered 2022-05-06: 7.5 mL via INTRAVENOUS

## 2022-05-09 ENCOUNTER — Encounter: Payer: Self-pay | Admitting: Nurse Practitioner

## 2022-05-09 ENCOUNTER — Ambulatory Visit (INDEPENDENT_AMBULATORY_CARE_PROVIDER_SITE_OTHER): Payer: Medicaid Other | Admitting: Nurse Practitioner

## 2022-05-09 VITALS — BP 122/73 | HR 75 | Temp 98.1°F | Ht 72.99 in | Wt 191.2 lb

## 2022-05-09 DIAGNOSIS — G8929 Other chronic pain: Secondary | ICD-10-CM

## 2022-05-09 DIAGNOSIS — I152 Hypertension secondary to endocrine disorders: Secondary | ICD-10-CM

## 2022-05-09 DIAGNOSIS — M545 Low back pain, unspecified: Secondary | ICD-10-CM

## 2022-05-09 DIAGNOSIS — E1159 Type 2 diabetes mellitus with other circulatory complications: Secondary | ICD-10-CM

## 2022-05-09 NOTE — Assessment & Plan Note (Signed)
Chronic, ongoing.  At this time will place a referral to neurosurgery for second opinion on back pain and impingement issues.  Patient goal is to be able to walk without pain.  Currently uses walker.

## 2022-05-09 NOTE — Progress Notes (Signed)
BP 122/73   Pulse 75   Temp 98.1 F (36.7 C) (Oral)   Ht 6' 0.99" (1.854 m)   Wt 191 lb 3.2 oz (86.7 kg)   SpO2 94%   BMI 25.23 kg/m    Subjective:    Patient ID: Johnny Kelley, male    DOB: March 23, 1966, 57 y.o.   MRN: [id number]  HPI: Johnny Kelley is a 57 y.o. male  Chief Complaint  Patient presents with   Blood Pressure Check   Headache   HYPERTENSION  Presents today for follow-up on BP, as had been at dentist and it was elevated they were concerned (165/80).  Had Amlodipine 2.5 MG added on recently for BP and headaches, on 04/10/22 -- he reports headaches improved. Currently taking Losartan, Amlodipine, HCTZ, Metoprolol.   Plan is to pull all teeth and get dentures.  He would like a second opinion on back pain -- currently followed at [facility] and has had back injections = 1st one worked well and second did not work well.   Hypertension status: stable  Satisfied with current treatment? yes Duration of hypertension: chronic BP monitoring frequency:  weekly BP range: 120-130/70-80 range when check BP medication side effects:  no Medication compliance: good compliance Previous BP meds: as above Aspirin: no Recurrent headaches: no Visual changes: no Palpitations: no Dyspnea: no Chest pain: no Lower extremity edema: no Dizzy/lightheaded: no   Relevant past medical, surgical, family and social history reviewed and updated as indicated. Interim medical history since our last visit reviewed. Allergies and medications reviewed and updated.  Review of Systems  Constitutional:  Negative for activity change, diaphoresis, fatigue and fever.  Respiratory:  Negative for cough, chest tightness, shortness of breath and wheezing.   Cardiovascular:  Negative for chest pain, palpitations and leg swelling.  Gastrointestinal: Negative.   Neurological: Negative.   Psychiatric/Behavioral: Negative.      Per HPI unless specifically indicated above     Objective:     BP 122/73   Pulse 75   Temp 98.1 F (36.7 C) (Oral)   Ht 6' 0.99" (1.854 m)   Wt 191 lb 3.2 oz (86.7 kg)   SpO2 94%   BMI 25.23 kg/m   Wt Readings from Last 3 Encounters:  05/09/22 191 lb 3.2 oz (86.7 kg)  04/10/22 189 lb 3.2 oz (85.8 kg)  04/04/22 190 lb (86.2 kg)    Physical Exam Vitals and nursing note reviewed.  Constitutional:      General: He is awake. He is not in acute distress.    Appearance: He is well-developed and well-groomed. He is not ill-appearing or toxic-appearing.  HENT:     Head: Normocephalic and atraumatic. No abrasion, contusion or laceration.     Right Ear: Hearing normal. No drainage.     Left Ear: Hearing normal. No drainage.  Eyes:     General: Lids are normal.        Right eye: No discharge.        Left eye: No discharge.     Extraocular Movements: Extraocular movements intact.     Conjunctiva/sclera: Conjunctivae normal.     Pupils: Pupils are equal, round, and reactive to light.     Visual Fields: Right eye visual fields normal and left eye visual fields normal.  Neck:     Thyroid: No thyromegaly.     Vascular: No carotid bruit.     Trachea: Trachea normal.  Cardiovascular:     Rate and Rhythm: Normal  rate and regular rhythm.     Heart sounds: Normal heart sounds, S1 normal and S2 normal. No murmur heard.    No gallop.  Pulmonary:     Effort: Pulmonary effort is normal. No accessory muscle usage or respiratory distress.     Breath sounds: Normal breath sounds.  Abdominal:     General: Bowel sounds are normal. There is no distension.     Palpations: Abdomen is soft.     Tenderness: There is no abdominal tenderness.  Musculoskeletal:        General: Normal range of motion.     Cervical back: Normal range of motion and neck supple.     Right lower leg: No edema.     Left lower leg: No edema.  Lymphadenopathy:     Cervical: No cervical adenopathy.  Skin:    General: Skin is warm and dry.     Capillary Refill: Capillary refill takes  less than 2 seconds.     Findings: No rash.  Neurological:     Mental Status: He is alert and oriented to person, place, and time.     Cranial Nerves: Cranial nerves 2-12 are intact.     Motor: Motor function is intact.     Coordination: Coordination is intact.     Gait: Gait is intact.     Deep Tendon Reflexes: Reflexes are normal and symmetric.     Reflex Scores:      Brachioradialis reflexes are 2+ on the right side and 2+ on the left side.      Patellar reflexes are 2+ on the right side and 2+ on the left side. Psychiatric:        Attention and Perception: Attention normal.        Mood and Affect: Mood normal.        Speech: Speech normal.        Behavior: Behavior normal. Behavior is cooperative.        Thought Content: Thought content normal.     Results for orders placed or performed in visit on 03/12/22  Bayer DCA Hb A1c Waived  Result Value Ref Range   HB A1C (BAYER DCA - WAIVED) 8.2 (H) 4.8 - 5.6 %  Comprehensive metabolic panel  Result Value Ref Range   Glucose 209 (H) 70 - 99 mg/dL   BUN 8 6 - 24 mg/dL   Creatinine, Ser 0.90 0.76 - 1.27 mg/dL   eGFR 99 >59 mL/min/1.73   BUN/Creatinine Ratio 9 9 - 20   Sodium 135 134 - 144 mmol/L   Potassium 4.2 3.5 - 5.2 mmol/L   Chloride 98 96 - 106 mmol/L   CO2 23 20 - 29 mmol/L   Calcium 9.3 8.7 - 10.2 mg/dL   Total Protein 6.8 6.0 - 8.5 g/dL   Albumin 3.2 (L) 3.8 - 4.9 g/dL   Globulin, Total 3.6 1.5 - 4.5 g/dL   Albumin/Globulin Ratio 0.9 (L) 1.2 - 2.2   Bilirubin Total 3.3 (H) 0.0 - 1.2 mg/dL   Alkaline Phosphatase 210 (H) 44 - 121 IU/L   AST 117 (H) 0 - 40 IU/L   ALT 69 (H) 0 - 44 IU/L  Lipid Panel w/o Chol/HDL Ratio  Result Value Ref Range   Cholesterol, Total 150 100 - 199 mg/dL   Triglycerides 117 0 - 149 mg/dL   HDL 69 >39 mg/dL   VLDL Cholesterol Cal 21 5 - 40 mg/dL   LDL Chol Calc (NIH) 60 0 - 99 mg/dL  PSA  Result Value Ref Range   Prostate Specific Ag, Serum 0.1 0.0 - 4.0 ng/mL      Assessment & Plan:    Problem List Items Addressed This Visit       Cardiovascular and Mediastinum   Hypertension associated with diabetes ([place]) - Primary    Chronic, stable.  BP at goal in office today and on home readings, suspect dentist high reading due to nerves.  Recommend he monitor BP at least a few mornings a week at home and document.  DASH diet at home.  Continue current medication regimen and adjust as needed.  Labs today: up to date.  Return in 3 months.         Other   Chronic low back pain    Chronic, ongoing.  At this time will place a referral to neurosurgery for second opinion on back pain and impingement issues.  Patient goal is to be able to walk without pain.  Currently uses walker.      Relevant Orders   Ambulatory referral to Neurosurgery     Follow up plan: Return for as scheduled 06/11/22.

## 2022-05-09 NOTE — Assessment & Plan Note (Addendum)
Chronic, stable.  BP at goal in office today and on home readings, suspect dentist high reading due to nerves.  Recommend he monitor BP at least a few mornings a week at home and document.  DASH diet at home.  Continue current medication regimen and adjust as needed.  Labs today: up to date.  Return in 3 months.

## 2022-05-11 ENCOUNTER — Other Ambulatory Visit: Payer: Self-pay | Admitting: Physician Assistant

## 2022-05-11 DIAGNOSIS — E1159 Type 2 diabetes mellitus with other circulatory complications: Secondary | ICD-10-CM

## 2022-05-12 NOTE — Telephone Encounter (Signed)
Unable to refill per protocol, Rx request is too soon. Last refill 04/10/22 for 30 and 1 refill.  Requested Prescriptions  Pending Prescriptions Disp Refills   amLODipine (NORVASC) 2.5 MG tablet [Pharmacy Med Name: AMLODIPINE BESYLATE 2.5 MG TAB] 30 tablet 1    Sig: TAKE 1 TABLET BY MOUTH EVERY DAY     Cardiovascular: Calcium Channel Blockers 2 Passed - 05/11/2022  7:33 PM      Passed - Last BP in normal range    BP Readings from Last 1 Encounters:  05/09/22 122/73         Passed - Last Heart Rate in normal range    Pulse Readings from Last 1 Encounters:  05/09/22 75         Passed - Valid encounter within last 6 months    Recent Outpatient Visits           3 days ago Hypertension associated with diabetes ([place])   [place], [place] T, NP   1 month ago Persistent headaches   [facility] Mecum, Erin E, PA-C   2 months ago Type 2 diabetes mellitus with diabetic neuropathy, without long-term current use of insulin ([place])   [place], [place] T, NP   4 months ago Type 2 diabetes mellitus with diabetic neuropathy, without long-term current use of insulin ([place])   [place], [place] T, NP   4 months ago Type 2 diabetes mellitus with diabetic neuropathy, without long-term current use of insulin ([place])   [place], Dani Gobble, PA-C       Future Appointments             In 1 month Cannady, Barbaraann Faster, NP [place], [place]   In 3 months Ralene Bathe, MD [place]

## 2022-05-13 ENCOUNTER — Encounter: Payer: Self-pay | Admitting: Oncology

## 2022-05-13 ENCOUNTER — Inpatient Hospital Stay: Payer: Medicaid Other | Attending: Oncology | Admitting: Oncology

## 2022-05-13 ENCOUNTER — Telehealth: Payer: Self-pay | Admitting: *Deleted

## 2022-05-13 ENCOUNTER — Inpatient Hospital Stay: Payer: Medicaid Other

## 2022-05-13 VITALS — BP 154/85 | HR 88 | Temp 98.4°F | Resp 18 | Ht 73.0 in | Wt 193.0 lb

## 2022-05-13 DIAGNOSIS — Z801 Family history of malignant neoplasm of trachea, bronchus and lung: Secondary | ICD-10-CM | POA: Insufficient documentation

## 2022-05-13 DIAGNOSIS — K7030 Alcoholic cirrhosis of liver without ascites: Secondary | ICD-10-CM

## 2022-05-13 DIAGNOSIS — F17210 Nicotine dependence, cigarettes, uncomplicated: Secondary | ICD-10-CM | POA: Diagnosis not present

## 2022-05-13 DIAGNOSIS — R930 Abnormal findings on diagnostic imaging of skull and head, not elsewhere classified: Secondary | ICD-10-CM | POA: Diagnosis not present

## 2022-05-13 DIAGNOSIS — C220 Liver cell carcinoma: Secondary | ICD-10-CM

## 2022-05-13 DIAGNOSIS — Z800 Family history of malignant neoplasm of digestive organs: Secondary | ICD-10-CM | POA: Diagnosis not present

## 2022-05-13 LAB — COMPREHENSIVE METABOLIC PANEL
ALT: 53 U/L — ABNORMAL HIGH (ref 0–44)
AST: 119 U/L — ABNORMAL HIGH (ref 15–41)
Albumin: 2.9 g/dL — ABNORMAL LOW (ref 3.5–5.0)
Alkaline Phosphatase: 194 U/L — ABNORMAL HIGH (ref 38–126)
Anion gap: 10 (ref 5–15)
BUN: 8 mg/dL (ref 6–20)
CO2: 23 mmol/L (ref 22–32)
Calcium: 8.4 mg/dL — ABNORMAL LOW (ref 8.9–10.3)
Chloride: 100 mmol/L (ref 98–111)
Creatinine, Ser: 0.7 mg/dL (ref 0.61–1.24)
GFR, Estimated: >60 mL/min (ref >60)
Glucose, Bld: 233 mg/dL — ABNORMAL HIGH (ref 70–99)
Potassium: 4.1 mmol/L (ref 3.5–5.1)
Sodium: 133 mmol/L — ABNORMAL LOW (ref 135–145)
Total Bilirubin: 6.3 mg/dL (ref 0.3–1.2)
Total Protein: 7.8 g/dL (ref 6.5–8.1)

## 2022-05-13 LAB — CBC WITH DIFFERENTIAL/PLATELET
Abs Immature Granulocytes: 0.02 10*3/uL (ref 0.00–0.07)
Basophils Absolute: 0 10*3/uL (ref 0.0–0.1)
Basophils Relative: 1 %
Eosinophils Absolute: 0.1 10*3/uL (ref 0.0–0.5)
Eosinophils Relative: 1 %
HCT: 41.4 % (ref 39.0–52.0)
Hemoglobin: 14.3 g/dL (ref 13.0–17.0)
Immature Granulocytes: 1 %
Lymphocytes Relative: 27 %
Lymphs Abs: 1.1 10*3/uL (ref 0.7–4.0)
MCH: 35.8 pg — ABNORMAL HIGH (ref 26.0–34.0)
MCHC: 34.5 g/dL (ref 30.0–36.0)
MCV: 103.8 fL — ABNORMAL HIGH (ref 80.0–100.0)
Monocytes Absolute: 0.6 10*3/uL (ref 0.1–1.0)
Monocytes Relative: 14 %
Neutro Abs: 2.2 10*3/uL (ref 1.7–7.7)
Neutrophils Relative %: 56 %
Platelets: 42 10*3/uL — ABNORMAL LOW (ref 150–400)
RBC: 3.99 MIL/uL — ABNORMAL LOW (ref 4.22–5.81)
RDW: 14.7 % (ref 11.5–15.5)
WBC: 4 10*3/uL (ref 4.0–10.5)
nRBC: 0 % (ref 0.0–0.2)

## 2022-05-13 NOTE — Progress Notes (Signed)
Hematology/Oncology Consult note [facility]  [phone] [phone]  Patient Care Team: Venita Lick, NP as PCP - General (Nurse Practitioner) Kate Sable, MD as PCP - Cardiology (Cardiology) Clent Jacks, RN as Oncology Nurse Navigator   Name of the patient: Johnny Kelley  SE:[id number]  16-Mar-1966   Date of visit: 05/13/22  Diagnosis-hepatocellular carcinoma  Chief complaint/ Reason for visit-routine follow-up of hepatocellular carcinoma  Heme/Onc history: Patient is a 57 year old male with past medical history significant for hepatitis C infection as well as significant alcohol intake.  He was noted to have abnormal LFTs.  In July 2021 he was found to have an elevated bilirubin of 1.9 with an AST ALT of 128 and 86 respectively and underwent a right upper quadrant ultrasound which showed a heterogeneous abnormality in the right hepatic lobe measuring 4.1 x 4.1 x 3.5 cm.  He was then seen by Dr. Allen Norris and underwent MRI liver with and without contrast which showed a 3.8 cm enhancing lesion in segment 6 compatible with [place] lie RADS 5.     Results of blood work from 01/13/2020 reveal a normal albumin level of 3.5.  AST and ALT mildly elevated at 70 and 51 respectively with a total bilirubin of 1.6.  PT/INR was normal.  Platelet count was mildly low at 105.  CT chest with contrast did not reveal any evidence of metastatic disease.  Patient underwent embolization for segment 6 hepatic lesion with interventional radiology at [facility] in November 2022.  Interval history-patient continues to drink alcohol daily.  He has been having issues with ambulation and follows up with orthopedics.  ECOG PS- 2 Pain scale- 0 Opioid associated constipation- no  Review of systems- Review of Systems  Constitutional:  Negative for chills, fever, malaise/fatigue and weight loss.  HENT:  Negative for congestion, ear discharge and nosebleeds.   Eyes:   Negative for blurred vision.  Respiratory:  Negative for cough, hemoptysis, sputum production, shortness of breath and wheezing.   Cardiovascular:  Negative for chest pain, palpitations, orthopnea and claudication.  Gastrointestinal:  Negative for abdominal pain, blood in stool, constipation, diarrhea, heartburn, melena, nausea and vomiting.  Genitourinary:  Negative for dysuria, flank pain, frequency, hematuria and urgency.  Musculoskeletal:  Negative for back pain, joint pain and myalgias.  Skin:  Negative for rash.  Neurological:  Negative for dizziness, tingling, focal weakness, seizures, weakness and headaches.       Gait imbalance  Endo/Heme/Allergies:  Does not bruise/bleed easily.  Psychiatric/Behavioral:  Negative for depression and suicidal ideas. The patient does not have insomnia.       No Known Allergies   Past Medical History:  Diagnosis Date   Cancer, hepatocellular ([place])    Diabetes mellitus without complication ([place])    Hepatitis C    Hypertension    Psoriasis    Stroke ([place])    Hx stroke in 2020   Substance abuse [facility])      Past Surgical History:  Procedure Laterality Date   IR RADIOLOGIST EVAL & MGMT  01/19/2020   IR RADIOLOGIST EVAL & MGMT  01/31/2020   IR RADIOLOGIST EVAL & MGMT  04/12/2020   IR RADIOLOGIST EVAL & MGMT  08/30/2020   KNEE SURGERY Right     Social History   Socioeconomic History   Marital status: Unknown    Spouse name: Not on file   Number of children: Not on file   Years of education: Not on file  Highest education level: Not on file  Occupational History   Occupation: unemployed  Tobacco Use   Smoking status: Every Day    Packs/day: 0.10    Years: 38.00    Total pack years: 3.80    Types: Cigarettes   Smokeless tobacco: Never  Vaping Use   Vaping Use: Never used  Substance and Sexual Activity   Alcohol use: Yes    Alcohol/week: 21.0 standard drinks of alcohol    Types: 21 Cans of beer per week    Comment: 2-3 beers a  day   Drug use: Not Currently    Frequency: 1.0 times per week    Types: Marijuana, Cocaine    Comment: reports marijuana use once per week and cocaine use 1-2 monthper month   Sexual activity: Not Currently  Other Topics Concern   Not on file  Social History Narrative   Not on file   Social Determinants of Health   Financial Resource Strain: Not on file  Food Insecurity: Not on file  Transportation Needs: No Transportation Needs (12/01/2019)   PRAPARE - Hydrologist (Medical): No    Lack of Transportation (Non-Medical): No  Physical Activity: Not on file  Stress: Stress Concern Present (12/01/2019)   [place]    Feeling of Stress : To some extent  Social Connections: Socially Isolated (12/01/2019)   Social Connection and Isolation Panel [NHANES]    Frequency of Communication with Friends and Family: Never    Frequency of Social Gatherings with Friends and Family: More than three times a week    Attends Religious Services: Never    Marine scientist or Organizations: No    Attends Music therapist: Never    Marital Status: Divorced  Human resources officer Violence: Not on file    Family History  Problem Relation Age of Onset   Diabetes Mother    Colon cancer Father    Diabetes Father    Kidney disease Maternal Grandmother    Lung cancer Maternal Grandfather    Hyperlipidemia Paternal Grandmother    COPD Paternal Grandfather      Current Outpatient Medications:    amLODipine (NORVASC) 2.5 MG tablet, Take 1 tablet (2.5 mg total) by mouth daily., Disp: 30 tablet, Rfl: 1   blood glucose meter kit and supplies KIT, Dispense based on patient and insurance preference. Use up to four times daily as directed. (FOR ICD-9 250.00, 250.01)., Disp: 1 each, Rfl: 0   clopidogrel (PLAVIX) 75 MG tablet, clopidogrel 75 mg tablet  TAKE 1 TABLET BY MOUTH EVERY DAY, Disp: , Rfl:     clotrimazole (LOTRIMIN) 1 % cream, clotrimazole 1 % topical cream, Disp: , Rfl:    Dulaglutide (TRULICITY) 1.5 0000000 SOPN, Inject 1.5 mg into the skin once a week., Disp: 6 mL, Rfl: 4   famotidine (PEPCID) 20 MG tablet, TAKE 1 TABLET BY MOUTH EVERYDAY AT BEDTIME, Disp: 90 tablet, Rfl: 1   gabapentin (NEURONTIN) 100 MG capsule, Take 1 capsule (100 mg total) by mouth daily., Disp: 90 capsule, Rfl: 3   hydrochlorothiazide (HYDRODIURIL) 25 MG tablet, Take 1 tablet (25 mg total) by mouth daily., Disp: 90 tablet, Rfl: 4   lidocaine (LIDODERM) 5 %, Place 1 patch onto the skin daily. Remove & Discard patch within 12 hours or as directed by MD, Disp: 30 patch, Rfl: 3   losartan (COZAAR) 100 MG tablet, Take 1 tablet (100 mg total) by  mouth daily., Disp: 90 tablet, Rfl: 0   meloxicam (MOBIC) 7.5 MG tablet, Take 7.5 mg by mouth daily., Disp: , Rfl:    metFORMIN (GLUCOPHAGE) 1000 MG tablet, TAKE 1 TABLET BY MOUTH EVERY DAY, Disp: 90 tablet, Rfl: 0   metoprolol succinate (TOPROL XL) 25 MG 24 hr tablet, Take 1 tablet (25 mg total) by mouth daily., Disp: 30 tablet, Rfl: 5   solifenacin (VESICARE) 10 MG tablet, solifenacin 10 mg tablet  TAKE 1 TABLET (10 MG TOTAL) BY MOUTH DAILY. MAY CAUSE CONSTIPATION/DRY MOUTH., Disp: , Rfl:    tamsulosin (FLOMAX) 0.4 MG CAPS capsule, Take 1 capsule (0.4 mg total) by mouth daily., Disp: 30 capsule, Rfl: 11   terbinafine (LAMISIL) 250 MG tablet, Take 1 tablet (250 mg total) by mouth daily., Disp: 30 tablet, Rfl: 0   tiZANidine (ZANAFLEX) 4 MG tablet, Take 4 mg by mouth every 6 (six) hours as needed., Disp: , Rfl:    TOVIAZ 8 MG TB24 tablet, Take 8 mg by mouth daily., Disp: , Rfl:   Physical exam:  Vitals:   05/13/22 1017  BP: (!) 154/85  Pulse: 88  Resp: 18  Temp: 98.4 F (36.9 C)  TempSrc: Tympanic  SpO2: 98%  Weight: 193 lb (87.5 kg)  Height: 6' 1"$  (1.854 m)   Physical Exam Eyes:     General: Scleral icterus present.  Cardiovascular:     Rate and Rhythm: Normal  rate and regular rhythm.     Heart sounds: Normal heart sounds.  Pulmonary:     Effort: Pulmonary effort is normal.     Breath sounds: Normal breath sounds.  Abdominal:     General: Bowel sounds are normal.     Palpations: Abdomen is soft.  Skin:    General: Skin is warm and dry.  Neurological:     Mental Status: He is alert and oriented to person, place, and time.         Latest Ref Rng & Units 05/13/2022   11:21 AM  CMP  Glucose 70 - 99 mg/dL 233   BUN 6 - 20 mg/dL 8   Creatinine 0.61 - 1.24 mg/dL 0.70   Sodium 135 - 145 mmol/L 133   Potassium 3.5 - 5.1 mmol/L 4.1   Chloride 98 - 111 mmol/L 100   CO2 22 - 32 mmol/L 23   Calcium 8.9 - 10.3 mg/dL 8.4   Total Protein 6.5 - 8.1 g/dL 7.8   Total Bilirubin 0.3 - 1.2 mg/dL 6.3   Alkaline Phos 38 - 126 U/L 194   AST 15 - 41 U/L 119   ALT 0 - 44 U/L 53       Latest Ref Rng & Units 05/13/2022   11:21 AM  CBC  WBC 4.0 - 10.5 K/uL 4.0   Hemoglobin 13.0 - 17.0 g/dL 14.3   Hematocrit 39.0 - 52.0 % 41.4   Platelets 150 - 400 K/uL 42     No images are attached to the encounter.  MR Lumbar Spine W Wo Contrast  Result Date: 05/06/2022 CLINICAL DATA:  Hepatocellular carcinoma, prior embolization. Heterogeneous marrow in the spine for further assessment. EXAM: MRI THORACIC AND LUMBAR SPINE WITHOUT AND WITH CONTRAST TECHNIQUE: Multiplanar and multiecho pulse sequences of the thoracic and lumbar spine were obtained without and with intravenous contrast. CONTRAST:  7.34m GADAVIST GADOBUTROL 1 MMOL/ML IV SOLN COMPARISON:  Multiple exams, including 04/30/2021 FINDINGS: MRI THORACIC SPINE FINDINGS Alignment:  No vertebral subluxation is observed. Vertebrae: Diffuse marrow heterogeneity is  present. Although this can be caused by marrow infiltrative processes, the most common causes include anemia, smoking, obesity, or advancing age. No specific indicators of malignancy. Mild disc desiccation between L4 and T11. Cord:  Unremarkable Paraspinal and  other soft tissues: Unremarkable Disc levels: As on the prior exam, there is a right paracentral disc protrusion flattening the anterior margin of the cord at the T6-7 level, but with plenty of CSF space posterior to the cord at this level. No other substantial spondylosis or degenerative disc disease. MRI LUMBAR SPINE FINDINGS Segmentation: The lowest lumbar type non-rib-bearing vertebra is labeled as L5. Alignment:  No vertebral subluxation is observed. Vertebrae: Marrow heterogeneity is present, similar to that described in the thoracic spine above. No focal worrisome lesion identified. Conus medullaris: Extends to the T12-L1 level and appears normal. Cauda equina unremarkable. Incidental fatty filum. Paraspinal and other soft tissues: Unremarkable Disc levels: L1-2: Borderline right foraminal stenosis due to facet arthropathy and disc osteophyte complex. L2-3: No impingement.  Mild disc bulge. L3-4: Mild displacement of the left L3 nerve in the lateral extraforaminal space due to disc bulge. Borderline central narrowing of the thecal sac. L4-5: Moderate central narrowing of the thecal sac with mild right and borderline left foraminal stenosis and moderate bilateral subarticular lateral recess stenosis due to facet arthropathy, disc bulge, and intervertebral spurring. Small right facet joint effusion. Synovial cyst posterior to the right facet joint, not in a position to cause impingement. Appearance similar to prior. L5-S1: Unremarkable. IMPRESSION: 1. Diffuse marrow heterogeneity is present. Although this can be caused by marrow infiltrative processes, the most common causes include anemia, smoking, obesity, or advancing age. No specific indicators of malignancy or worrisome focal lesion identified. 2. Lumbar spondylosis and degenerative disc disease, causing moderate impingement at L4-5 and mild impingement at L3-4 as noted above. Similar appearance to prior. 3. As on the prior exam, there is a right  paracentral disc protrusion at T6-7 flattening the anterior margin of the cord, but with plenty of CSF space posterior to the cord at this level. Electronically Signed   By: Van Clines M.D.   On: 05/06/2022 17:15   MR THORACIC SPINE W WO CONTRAST  Result Date: 05/06/2022 CLINICAL DATA:  Hepatocellular carcinoma, prior embolization. Heterogeneous marrow in the spine for further assessment. EXAM: MRI THORACIC AND LUMBAR SPINE WITHOUT AND WITH CONTRAST TECHNIQUE: Multiplanar and multiecho pulse sequences of the thoracic and lumbar spine were obtained without and with intravenous contrast. CONTRAST:  7.62m GADAVIST GADOBUTROL 1 MMOL/ML IV SOLN COMPARISON:  Multiple exams, including 04/30/2021 FINDINGS: MRI THORACIC SPINE FINDINGS Alignment:  No vertebral subluxation is observed. Vertebrae: Diffuse marrow heterogeneity is present. Although this can be caused by marrow infiltrative processes, the most common causes include anemia, smoking, obesity, or advancing age. No specific indicators of malignancy. Mild disc desiccation between L4 and T11. Cord:  Unremarkable Paraspinal and other soft tissues: Unremarkable Disc levels: As on the prior exam, there is a right paracentral disc protrusion flattening the anterior margin of the cord at the T6-7 level, but with plenty of CSF space posterior to the cord at this level. No other substantial spondylosis or degenerative disc disease. MRI LUMBAR SPINE FINDINGS Segmentation: The lowest lumbar type non-rib-bearing vertebra is labeled as L5. Alignment:  No vertebral subluxation is observed. Vertebrae: Marrow heterogeneity is present, similar to that described in the thoracic spine above. No focal worrisome lesion identified. Conus medullaris: Extends to the T12-L1 level and appears normal. Cauda equina unremarkable. Incidental fatty filum.  Paraspinal and other soft tissues: Unremarkable Disc levels: L1-2: Borderline right foraminal stenosis due to facet arthropathy and  disc osteophyte complex. L2-3: No impingement.  Mild disc bulge. L3-4: Mild displacement of the left L3 nerve in the lateral extraforaminal space due to disc bulge. Borderline central narrowing of the thecal sac. L4-5: Moderate central narrowing of the thecal sac with mild right and borderline left foraminal stenosis and moderate bilateral subarticular lateral recess stenosis due to facet arthropathy, disc bulge, and intervertebral spurring. Small right facet joint effusion. Synovial cyst posterior to the right facet joint, not in a position to cause impingement. Appearance similar to prior. L5-S1: Unremarkable. IMPRESSION: 1. Diffuse marrow heterogeneity is present. Although this can be caused by marrow infiltrative processes, the most common causes include anemia, smoking, obesity, or advancing age. No specific indicators of malignancy or worrisome focal lesion identified. 2. Lumbar spondylosis and degenerative disc disease, causing moderate impingement at L4-5 and mild impingement at L3-4 as noted above. Similar appearance to prior. 3. As on the prior exam, there is a right paracentral disc protrusion at T6-7 flattening the anterior margin of the cord, but with plenty of CSF space posterior to the cord at this level. Electronically Signed   By: Van Clines M.D.   On: 05/06/2022 17:15   MR Abdomen W Wo Contrast  Result Date: 05/06/2022 CLINICAL DATA:  Hepatocellular carcinoma status post embolization 01/2021 EXAM: MRI ABDOMEN WITHOUT AND WITH CONTRAST TECHNIQUE: Multiplanar multisequence MR imaging of the abdomen was performed both before and after the administration of intravenous contrast. CONTRAST:  7.54m GADAVIST GADOBUTROL 1 MMOL/ML IV SOLN COMPARISON:  MR abdomen dated 12/16/2021 FINDINGS: Lower chest: No acute findings. Hepatobiliary: Cirrhotic morphology. New diffuse signal loss on out of phase images. No substantial change in size of segment 6 post ablation zone with interval decreased  conspicuity of previously noted areas of residual nodular enhancement along the anteromedial and superior aspects of the ablation zone. Previously noted areas of arterial enhancement within segment 6 and 7 are also decreased in conspicuity, which may be related to timing of contrast bolus. No bile duct dilation. Cholelithiasis. New pericholecystic free fluid. Pancreas: No mass, inflammatory changes, or other parenchymal abnormality identified. Spleen: Mildly enlarged in AP dimension measuring 14.0 cm, previously 12.4 cm. Scattered cirrhotic nodules. Adrenals/Urinary Tract: No adrenal nodules. No suspicious renal masses identified. No evidence of hydronephrosis. Stomach/Bowel: Visualized portions within the abdomen are unremarkable. Vascular/Lymphatic: No pathologically enlarged lymph nodes identified. No abdominal aortic aneurysm demonstrated. Aortic atherosclerosis. Other:  None. Musculoskeletal: Heterogeneous enhancement of the thoracolumbar spine is better evaluated on dedicated concurrent MRI. IMPRESSION: 1. No substantial change in size of segment 6 post ablation zone with interval decreased conspicuity of previously noted areas of residual nodular enhancement along the anteromedial and superior aspects of the ablation zone, which may be related to differences in contrast bolus timing. LR TR equivocal. 2. Previously noted areas of arterial enhancement within segment 6 and 7 are also decreased in conspicuity, which may be related to timing of contrast bolus. LI-RADS 3. 3. New diffuse signal loss on out of phase images suggestive of hepatic steatosis. 4. New mild splenomegaly. 5. Cholelithiasis with new pericholecystic free fluid, likely reactive. 6. Heterogeneous enhancement of the thoracolumbar spine is better evaluated on dedicated concurrent MRI. 7.  Aortic Atherosclerosis (ICD10-I70.0). Electronically Signed   By: LDarrin NipperM.D.   On: 05/06/2022 12:28     Assessment and plan- Patient is a 57y.o. male who  is here for  follow-up of following issues:  Hepatocellular carcinoma: Have reviewed MRI abdomen images independently and discussed findings with the patient which shows changes of cirrhosis.  No significant change in the segment 6 postablation zone.  Prior noted enhancement in segment 6 and 7 are also decreased.  These were reported to be LI-RADS 3.  Mild splenomegaly.  Overall this is not concerning for progression of his hepatocellular carcinoma.  I will plan to get a repeat MRI abdomen in 6 months and see him thereafter.  AFP from today is pending  Liver cirrhosis: Labs today reveal elevated bilirubin of 6.3.  Previously his bilirubin has been mostly around 3.  This is likely secondary to his regular alcohol intake.  I have counseled him strongly to cut down/abstain from alcohol but he does not wish to do so.  I will repeat his LFTs again in 2 weeks and if they continue to be this high I will have to refer him back to Dr. Allen Norris to manage his chronic liver disease.  Abnormal MRI spine:Patient was noted to have an infiltrative look in his lumbar spine.  This could also be seen in conditions like obesity smoking and advancing age.  Previous bone marrow biopsy was negative for malignancy.  Has remained stable as compared to his MRI in January 2023 and therefore likely to be unrelated to malignancy.  He does not require any further fine imaging from my standpoint  CBC with differential CMP AFP and repeat imaging in 6 months and see me   Visit Diagnosis 1. Hepatocellular carcinoma ([place])   2. Alcoholic cirrhosis of liver without ascites ([place])      Dr. Randa Evens, MD, MPH [facility] [id number] 05/13/2022 3:37 PM

## 2022-05-13 NOTE — Telephone Encounter (Signed)
Called Johnny Kelley about his brother total bili is 6.3. Dr Janese Banks states that he needs to come back in 2 weeks which is 2/27 at 8:45 and to tell the pt.  needs to come for repeat lfts in 2 weeks. Tell him it's likely due to alcohol and he needs to cut down. I asked Dominica Severin to tell him this about cutting down on his alcohol. Dominica Severin said he will tell him

## 2022-05-15 LAB — AFP TUMOR MARKER: AFP, Serum, Tumor Marker: 4.6 ng/mL (ref 0.0–8.4)

## 2022-05-27 ENCOUNTER — Inpatient Hospital Stay: Payer: Medicaid Other

## 2022-05-27 DIAGNOSIS — C220 Liver cell carcinoma: Secondary | ICD-10-CM

## 2022-05-27 LAB — COMPREHENSIVE METABOLIC PANEL
ALT: 51 U/L — ABNORMAL HIGH (ref 0–44)
AST: 109 U/L — ABNORMAL HIGH (ref 15–41)
Albumin: 2.6 g/dL — ABNORMAL LOW (ref 3.5–5.0)
Alkaline Phosphatase: 232 U/L — ABNORMAL HIGH (ref 38–126)
Anion gap: 8 (ref 5–15)
BUN: 8 mg/dL (ref 6–20)
CO2: 26 mmol/L (ref 22–32)
Calcium: 8.3 mg/dL — ABNORMAL LOW (ref 8.9–10.3)
Chloride: 97 mmol/L — ABNORMAL LOW (ref 98–111)
Creatinine, Ser: 0.98 mg/dL (ref 0.61–1.24)
GFR, Estimated: >60 mL/min (ref >60)
Glucose, Bld: 386 mg/dL — ABNORMAL HIGH (ref 70–99)
Potassium: 3.8 mmol/L (ref 3.5–5.1)
Sodium: 131 mmol/L — ABNORMAL LOW (ref 135–145)
Total Bilirubin: 7.2 mg/dL (ref 0.3–1.2)
Total Protein: 7.6 g/dL (ref 6.5–8.1)

## 2022-05-27 LAB — CBC WITH DIFFERENTIAL/PLATELET
Abs Immature Granulocytes: 0.01 10*3/uL (ref 0.00–0.07)
Basophils Absolute: 0 10*3/uL (ref 0.0–0.1)
Basophils Relative: 1 %
Eosinophils Absolute: 0.1 10*3/uL (ref 0.0–0.5)
Eosinophils Relative: 3 %
HCT: 38.8 % — ABNORMAL LOW (ref 39.0–52.0)
Hemoglobin: 13.3 g/dL (ref 13.0–17.0)
Immature Granulocytes: 0 %
Lymphocytes Relative: 26 %
Lymphs Abs: 1.2 10*3/uL (ref 0.7–4.0)
MCH: 35.7 pg — ABNORMAL HIGH (ref 26.0–34.0)
MCHC: 34.3 g/dL (ref 30.0–36.0)
MCV: 104 fL — ABNORMAL HIGH (ref 80.0–100.0)
Monocytes Absolute: 0.5 10*3/uL (ref 0.1–1.0)
Monocytes Relative: 11 %
Neutro Abs: 2.6 10*3/uL (ref 1.7–7.7)
Neutrophils Relative %: 59 %
Platelets: 42 10*3/uL — ABNORMAL LOW (ref 150–400)
RBC: 3.73 MIL/uL — ABNORMAL LOW (ref 4.22–5.81)
RDW: 14.8 % (ref 11.5–15.5)
WBC: 4.5 10*3/uL (ref 4.0–10.5)
nRBC: 0 % (ref 0.0–0.2)

## 2022-05-27 LAB — PROTIME-INR
INR: 1.5 — ABNORMAL HIGH (ref 0.8–1.2)
Prothrombin Time: 18.1 seconds — ABNORMAL HIGH (ref 11.4–15.2)

## 2022-05-29 LAB — AFP TUMOR MARKER: AFP, Serum, Tumor Marker: 3.9 ng/mL (ref 0.0–8.4)

## 2022-06-09 NOTE — Progress Notes (Deleted)
Referring Physician:  Venita Lick, NP [address] [place],  [place] [address]  Primary Physician:  Venita Lick, NP  History of Present Illness: 06/09/2022 Mr. Johnny Kelley is here today with a chief complaint of ***  Pain for 2 years. Low back pain radiates to right hip down right leg. Numbness in right leg.   Duration: *** 2 years ago Location: *** Quality: ***10/10? Severity: *** Numbness in right leg , Precipitating: aggravated by ***Bending, Standing, and walking  Modifying factors: made better by *** Weakness: none Timing: *** constant Bowel/Bladder Dysfunction: none  Conservative measures:  Physical therapy: ***   Multimodal medical therapy including regular antiinflammatories: *** meloxicam,tizanidine,gabapentin, Medrol dosepak,  Injections: *** epidural steroid injections ?  04/22/22: 4-5 bilateral transforaminal injection.   Past Surgery: *** denies  KASTIEL GADBOIS has ***no symptoms of cervical myelopathy.  The symptoms are causing a significant impact on the patient's life.   I have utilized the care everywhere function in epic to review the outside records available from external health systems.  Review of Systems:  A 10 point review of systems is negative, except for the pertinent positives and negatives detailed in the HPI.  Past Medical History: Past Medical History:  Diagnosis Date   Cancer, hepatocellular ([place])    Diabetes mellitus without complication ([place])    Hepatitis C    Hypertension    Psoriasis    Stroke ([place])    Hx stroke in 2020   Substance abuse [facility])     Past Surgical History: Past Surgical History:  Procedure Laterality Date   IR RADIOLOGIST EVAL & MGMT  01/19/2020   IR RADIOLOGIST EVAL & MGMT  01/31/2020   IR RADIOLOGIST EVAL & MGMT  04/12/2020   IR RADIOLOGIST EVAL & MGMT  08/30/2020   KNEE SURGERY Right     Allergies: Allergies as of 06/12/2022   (No Known Allergies)    Medications: No outpatient  medications have been marked as taking for the 06/12/22 encounter (Appointment) with Meade Maw, MD.    Social History: Social History   Tobacco Use   Smoking status: Every Day    Packs/day: 0.10    Years: 38.00    Total pack years: 3.80    Types: Cigarettes   Smokeless tobacco: Never  Vaping Use   Vaping Use: Never used  Substance Use Topics   Alcohol use: Yes    Alcohol/week: 21.0 standard drinks of alcohol    Types: 21 Cans of beer per week    Comment: 2-3 beers a day   Drug use: Not Currently    Frequency: 1.0 times per week    Types: Marijuana, Cocaine    Comment: reports marijuana use once per week and cocaine use 1-2 monthper month    Family Medical History: Family History  Problem Relation Age of Onset   Diabetes Mother    Colon cancer Father    Diabetes Father    Kidney disease Maternal Grandmother    Lung cancer Maternal Grandfather    Hyperlipidemia Paternal Grandmother    COPD Paternal Grandfather     Physical Examination: There were no vitals filed for this visit.  General: Patient is well developed, well nourished, calm, collected, and in no apparent distress. Attention to examination is appropriate.  Neck:   Supple.  Full range of motion.  Respiratory: Patient is breathing without any difficulty.   NEUROLOGICAL:     Awake, alert, oriented to person, place, and time.  Speech  is clear and fluent.   Cranial Nerves: Pupils equal round and reactive to light.  Facial tone is symmetric.  Facial sensation is symmetric. Shoulder shrug is symmetric. Tongue protrusion is midline.  There is no pronator drift.  ROM of spine: full.    Strength: Side Biceps Triceps Deltoid Interossei Grip Wrist Ext. Wrist Flex.  R '5 5 5 5 5 5 5  '$ L '5 5 5 5 5 5 5   '$ Side Iliopsoas Quads Hamstring PF DF EHL  R '5 5 5 5 5 5  '$ L '5 5 5 5 5 5   '$ Reflexes are ***2+ and symmetric at the biceps, triceps, brachioradialis, patella and achilles.   Hoffman's is absent.    Bilateral upper and lower extremity sensation is intact to light touch.    No evidence of dysmetria noted.  Gait is normal.     Medical Decision Making  Imaging: ***  I have personally reviewed the images and agree with the above interpretation.  Assessment and Plan: Mr. Prenatt is a pleasant 57 y.o. male with ***    Thank you for involving me in the care of this patient.      [place] K. Izora Ribas MD, [facility] Neurosurgery

## 2022-06-10 ENCOUNTER — Emergency Department: Payer: Medicaid Other

## 2022-06-10 ENCOUNTER — Encounter: Payer: Self-pay | Admitting: Emergency Medicine

## 2022-06-10 ENCOUNTER — Inpatient Hospital Stay
Admission: EM | Admit: 2022-06-10 | Discharge: 2022-06-30 | DRG: 097 | Disposition: E | Payer: Medicaid Other | Attending: Student | Admitting: Student

## 2022-06-10 ENCOUNTER — Other Ambulatory Visit: Payer: Self-pay

## 2022-06-10 DIAGNOSIS — E1140 Type 2 diabetes mellitus with diabetic neuropathy, unspecified: Secondary | ICD-10-CM | POA: Diagnosis present

## 2022-06-10 DIAGNOSIS — K766 Portal hypertension: Secondary | ICD-10-CM | POA: Diagnosis present

## 2022-06-10 DIAGNOSIS — E875 Hyperkalemia: Secondary | ICD-10-CM | POA: Diagnosis present

## 2022-06-10 DIAGNOSIS — N138 Other obstructive and reflux uropathy: Secondary | ICD-10-CM | POA: Diagnosis present

## 2022-06-10 DIAGNOSIS — A419 Sepsis, unspecified organism: Secondary | ICD-10-CM | POA: Diagnosis present

## 2022-06-10 DIAGNOSIS — Y92009 Unspecified place in unspecified non-institutional (private) residence as the place of occurrence of the external cause: Secondary | ICD-10-CM | POA: Diagnosis not present

## 2022-06-10 DIAGNOSIS — A630 Anogenital (venereal) warts: Secondary | ICD-10-CM | POA: Diagnosis present

## 2022-06-10 DIAGNOSIS — R4182 Altered mental status, unspecified: Secondary | ICD-10-CM | POA: Diagnosis not present

## 2022-06-10 DIAGNOSIS — Z716 Tobacco abuse counseling: Secondary | ICD-10-CM

## 2022-06-10 DIAGNOSIS — I959 Hypotension, unspecified: Secondary | ICD-10-CM | POA: Diagnosis present

## 2022-06-10 DIAGNOSIS — G8929 Other chronic pain: Secondary | ICD-10-CM | POA: Diagnosis present

## 2022-06-10 DIAGNOSIS — B182 Chronic viral hepatitis C: Secondary | ICD-10-CM | POA: Diagnosis present

## 2022-06-10 DIAGNOSIS — N170 Acute kidney failure with tubular necrosis: Secondary | ICD-10-CM | POA: Diagnosis present

## 2022-06-10 DIAGNOSIS — F1410 Cocaine abuse, uncomplicated: Secondary | ICD-10-CM | POA: Diagnosis present

## 2022-06-10 DIAGNOSIS — D539 Nutritional anemia, unspecified: Secondary | ICD-10-CM | POA: Diagnosis present

## 2022-06-10 DIAGNOSIS — K8010 Calculus of gallbladder with chronic cholecystitis without obstruction: Secondary | ICD-10-CM | POA: Diagnosis present

## 2022-06-10 DIAGNOSIS — I471 Supraventricular tachycardia, unspecified: Secondary | ICD-10-CM | POA: Diagnosis not present

## 2022-06-10 DIAGNOSIS — E8809 Other disorders of plasma-protein metabolism, not elsewhere classified: Secondary | ICD-10-CM | POA: Diagnosis present

## 2022-06-10 DIAGNOSIS — E440 Moderate protein-calorie malnutrition: Secondary | ICD-10-CM | POA: Insufficient documentation

## 2022-06-10 DIAGNOSIS — D7589 Other specified diseases of blood and blood-forming organs: Secondary | ICD-10-CM | POA: Diagnosis present

## 2022-06-10 DIAGNOSIS — Z66 Do not resuscitate: Secondary | ICD-10-CM | POA: Diagnosis present

## 2022-06-10 DIAGNOSIS — E871 Hypo-osmolality and hyponatremia: Secondary | ICD-10-CM | POA: Diagnosis present

## 2022-06-10 DIAGNOSIS — M48061 Spinal stenosis, lumbar region without neurogenic claudication: Secondary | ICD-10-CM | POA: Diagnosis not present

## 2022-06-10 DIAGNOSIS — E860 Dehydration: Secondary | ICD-10-CM | POA: Diagnosis present

## 2022-06-10 DIAGNOSIS — B451 Cerebral cryptococcosis: Secondary | ICD-10-CM | POA: Insufficient documentation

## 2022-06-10 DIAGNOSIS — Z7141 Alcohol abuse counseling and surveillance of alcoholic: Secondary | ICD-10-CM

## 2022-06-10 DIAGNOSIS — K7460 Unspecified cirrhosis of liver: Secondary | ICD-10-CM | POA: Diagnosis not present

## 2022-06-10 DIAGNOSIS — Z6825 Body mass index (BMI) 25.0-25.9, adult: Secondary | ICD-10-CM

## 2022-06-10 DIAGNOSIS — F1290 Cannabis use, unspecified, uncomplicated: Secondary | ICD-10-CM | POA: Diagnosis present

## 2022-06-10 DIAGNOSIS — D689 Coagulation defect, unspecified: Secondary | ICD-10-CM | POA: Diagnosis present

## 2022-06-10 DIAGNOSIS — Z79899 Other long term (current) drug therapy: Secondary | ICD-10-CM

## 2022-06-10 DIAGNOSIS — Z515 Encounter for palliative care: Secondary | ICD-10-CM | POA: Diagnosis not present

## 2022-06-10 DIAGNOSIS — I5032 Chronic diastolic (congestive) heart failure: Secondary | ICD-10-CM | POA: Diagnosis present

## 2022-06-10 DIAGNOSIS — K7290 Hepatic failure, unspecified without coma: Secondary | ICD-10-CM | POA: Diagnosis present

## 2022-06-10 DIAGNOSIS — Z7902 Long term (current) use of antithrombotics/antiplatelets: Secondary | ICD-10-CM

## 2022-06-10 DIAGNOSIS — D638 Anemia in other chronic diseases classified elsewhere: Secondary | ICD-10-CM | POA: Diagnosis present

## 2022-06-10 DIAGNOSIS — Z801 Family history of malignant neoplasm of trachea, bronchus and lung: Secondary | ICD-10-CM

## 2022-06-10 DIAGNOSIS — Z560 Unemployment, unspecified: Secondary | ICD-10-CM

## 2022-06-10 DIAGNOSIS — M5124 Other intervertebral disc displacement, thoracic region: Secondary | ICD-10-CM | POA: Diagnosis present

## 2022-06-10 DIAGNOSIS — E8721 Acute metabolic acidosis: Secondary | ICD-10-CM | POA: Diagnosis not present

## 2022-06-10 DIAGNOSIS — I110 Hypertensive heart disease with heart failure: Secondary | ICD-10-CM | POA: Diagnosis present

## 2022-06-10 DIAGNOSIS — Z800 Family history of malignant neoplasm of digestive organs: Secondary | ICD-10-CM

## 2022-06-10 DIAGNOSIS — I10 Essential (primary) hypertension: Secondary | ICD-10-CM | POA: Diagnosis not present

## 2022-06-10 DIAGNOSIS — Z8673 Personal history of transient ischemic attack (TIA), and cerebral infarction without residual deficits: Secondary | ICD-10-CM

## 2022-06-10 DIAGNOSIS — W19XXXA Unspecified fall, initial encounter: Secondary | ICD-10-CM | POA: Diagnosis not present

## 2022-06-10 DIAGNOSIS — A046 Enteritis due to Yersinia enterocolitica: Secondary | ICD-10-CM | POA: Diagnosis present

## 2022-06-10 DIAGNOSIS — Z791 Long term (current) use of non-steroidal anti-inflammatories (NSAID): Secondary | ICD-10-CM

## 2022-06-10 DIAGNOSIS — F17210 Nicotine dependence, cigarettes, uncomplicated: Secondary | ICD-10-CM | POA: Diagnosis present

## 2022-06-10 DIAGNOSIS — K219 Gastro-esophageal reflux disease without esophagitis: Secondary | ICD-10-CM | POA: Diagnosis present

## 2022-06-10 DIAGNOSIS — F1010 Alcohol abuse, uncomplicated: Secondary | ICD-10-CM | POA: Diagnosis present

## 2022-06-10 DIAGNOSIS — K529 Noninfective gastroenteritis and colitis, unspecified: Secondary | ICD-10-CM | POA: Diagnosis present

## 2022-06-10 DIAGNOSIS — M5136 Other intervertebral disc degeneration, lumbar region: Secondary | ICD-10-CM | POA: Diagnosis present

## 2022-06-10 DIAGNOSIS — D696 Thrombocytopenia, unspecified: Secondary | ICD-10-CM | POA: Diagnosis not present

## 2022-06-10 DIAGNOSIS — M549 Dorsalgia, unspecified: Secondary | ICD-10-CM | POA: Diagnosis present

## 2022-06-10 DIAGNOSIS — M542 Cervicalgia: Secondary | ICD-10-CM | POA: Diagnosis present

## 2022-06-10 DIAGNOSIS — N401 Enlarged prostate with lower urinary tract symptoms: Secondary | ICD-10-CM | POA: Diagnosis present

## 2022-06-10 DIAGNOSIS — Z7984 Long term (current) use of oral hypoglycemic drugs: Secondary | ICD-10-CM

## 2022-06-10 DIAGNOSIS — T367X5A Adverse effect of antifungal antibiotics, systemically used, initial encounter: Secondary | ICD-10-CM | POA: Diagnosis present

## 2022-06-10 DIAGNOSIS — K7682 Hepatic encephalopathy: Principal | ICD-10-CM | POA: Diagnosis present

## 2022-06-10 DIAGNOSIS — E876 Hypokalemia: Secondary | ICD-10-CM | POA: Diagnosis present

## 2022-06-10 DIAGNOSIS — Z720 Tobacco use: Secondary | ICD-10-CM | POA: Diagnosis not present

## 2022-06-10 DIAGNOSIS — M47816 Spondylosis without myelopathy or radiculopathy, lumbar region: Secondary | ICD-10-CM | POA: Diagnosis present

## 2022-06-10 DIAGNOSIS — Z833 Family history of diabetes mellitus: Secondary | ICD-10-CM

## 2022-06-10 DIAGNOSIS — Z7985 Long-term (current) use of injectable non-insulin antidiabetic drugs: Secondary | ICD-10-CM

## 2022-06-10 DIAGNOSIS — D6959 Other secondary thrombocytopenia: Secondary | ICD-10-CM | POA: Diagnosis present

## 2022-06-10 DIAGNOSIS — Z8505 Personal history of malignant neoplasm of liver: Secondary | ICD-10-CM

## 2022-06-10 DIAGNOSIS — E785 Hyperlipidemia, unspecified: Secondary | ICD-10-CM | POA: Diagnosis present

## 2022-06-10 DIAGNOSIS — Z825 Family history of asthma and other chronic lower respiratory diseases: Secondary | ICD-10-CM

## 2022-06-10 DIAGNOSIS — Z83438 Family history of other disorder of lipoprotein metabolism and other lipidemia: Secondary | ICD-10-CM

## 2022-06-10 DIAGNOSIS — K7689 Other specified diseases of liver: Secondary | ICD-10-CM | POA: Diagnosis present

## 2022-06-10 LAB — CBC WITH DIFFERENTIAL/PLATELET
Abs Immature Granulocytes: 0.02 10*3/uL (ref 0.00–0.07)
Basophils Absolute: 0.1 10*3/uL (ref 0.0–0.1)
Basophils Relative: 1 %
Eosinophils Absolute: 0 10*3/uL (ref 0.0–0.5)
Eosinophils Relative: 0 %
HCT: 40.5 % (ref 39.0–52.0)
Hemoglobin: 14.4 g/dL (ref 13.0–17.0)
Immature Granulocytes: 0 %
Lymphocytes Relative: 20 %
Lymphs Abs: 1.2 10*3/uL (ref 0.7–4.0)
MCH: 36.5 pg — ABNORMAL HIGH (ref 26.0–34.0)
MCHC: 35.6 g/dL (ref 30.0–36.0)
MCV: 102.5 fL — ABNORMAL HIGH (ref 80.0–100.0)
Monocytes Absolute: 0.8 10*3/uL (ref 0.1–1.0)
Monocytes Relative: 14 %
Neutro Abs: 3.9 10*3/uL (ref 1.7–7.7)
Neutrophils Relative %: 65 %
Platelets: 36 10*3/uL — ABNORMAL LOW (ref 150–400)
RBC: 3.95 MIL/uL — ABNORMAL LOW (ref 4.22–5.81)
RDW: 16.2 % — ABNORMAL HIGH (ref 11.5–15.5)
Smear Review: DECREASED
WBC: 6.1 10*3/uL (ref 4.0–10.5)
nRBC: 0 % (ref 0.0–0.2)

## 2022-06-10 LAB — BRAIN NATRIURETIC PEPTIDE: B Natriuretic Peptide: 410.8 pg/mL — ABNORMAL HIGH (ref 0.0–100.0)

## 2022-06-10 LAB — COMPREHENSIVE METABOLIC PANEL
ALT: 40 U/L (ref 0–44)
AST: 98 U/L — ABNORMAL HIGH (ref 15–41)
Albumin: 2.3 g/dL — ABNORMAL LOW (ref 3.5–5.0)
Alkaline Phosphatase: 160 U/L — ABNORMAL HIGH (ref 38–126)
Anion gap: 13 (ref 5–15)
BUN: 14 mg/dL (ref 6–20)
CO2: 22 mmol/L (ref 22–32)
Calcium: 8.4 mg/dL — ABNORMAL LOW (ref 8.9–10.3)
Chloride: 95 mmol/L — ABNORMAL LOW (ref 98–111)
Creatinine, Ser: 0.61 mg/dL (ref 0.61–1.24)
GFR, Estimated: >60 mL/min (ref >60)
Glucose, Bld: 255 mg/dL — ABNORMAL HIGH (ref 70–99)
Potassium: 5.3 mmol/L — ABNORMAL HIGH (ref 3.5–5.1)
Sodium: 130 mmol/L — ABNORMAL LOW (ref 135–145)
Total Bilirubin: 11.7 mg/dL — ABNORMAL HIGH (ref 0.3–1.2)
Total Protein: 7.3 g/dL (ref 6.5–8.1)

## 2022-06-10 LAB — URINALYSIS, ROUTINE W REFLEX MICROSCOPIC
Bacteria, UA: NONE SEEN
Glucose, UA: >=500 mg/dL — AB
Ketones, ur: 5 mg/dL — AB
Leukocytes,Ua: NEGATIVE
Nitrite: NEGATIVE
Protein, ur: NEGATIVE mg/dL
Specific Gravity, Urine: 1.029 (ref 1.005–1.030)
Squamous Epithelial / HPF: NONE SEEN /HPF (ref 0–5)
pH: 6 (ref 5.0–8.0)

## 2022-06-10 LAB — URINE DRUG SCREEN, QUALITATIVE (ARMC ONLY)
Amphetamines, Ur Screen: NOT DETECTED
Barbiturates, Ur Screen: NOT DETECTED
Benzodiazepine, Ur Scrn: NOT DETECTED
Cannabinoid 50 Ng, Ur ~~LOC~~: NOT DETECTED
Cocaine Metabolite,Ur ~~LOC~~: NOT DETECTED
MDMA (Ecstasy)Ur Screen: NOT DETECTED
Methadone Scn, Ur: NOT DETECTED
Opiate, Ur Screen: NOT DETECTED
Phencyclidine (PCP) Ur S: NOT DETECTED
Tricyclic, Ur Screen: NOT DETECTED

## 2022-06-10 LAB — PROTIME-INR
INR: 1.3 — ABNORMAL HIGH (ref 0.8–1.2)
Prothrombin Time: 16.3 seconds — ABNORMAL HIGH (ref 11.4–15.2)

## 2022-06-10 LAB — AMMONIA: Ammonia: 36 umol/L — ABNORMAL HIGH (ref 9–35)

## 2022-06-10 LAB — APTT: aPTT: 28 seconds (ref 24–36)

## 2022-06-10 LAB — GLUCOSE, CAPILLARY
Glucose-Capillary: 264 mg/dL — ABNORMAL HIGH (ref 70–99)
Glucose-Capillary: 252 mg/dL — ABNORMAL HIGH (ref 70–99)

## 2022-06-10 LAB — POTASSIUM: Potassium: 4.3 mmol/L (ref 3.5–5.1)

## 2022-06-10 MED ORDER — NICOTINE 21 MG/24HR TD PT24
21.0000 mg | MEDICATED_PATCH | Freq: Every day | TRANSDERMAL | Status: DC
  Administered 2022-06-10 – 2022-06-18 (×9): 21 mg via TRANSDERMAL
  Filled 2022-06-10 (×9): qty 1

## 2022-06-10 MED ORDER — TIZANIDINE HCL 4 MG PO TABS: 4.0000 mg | ORAL_TABLET | Freq: Four times a day (QID) | ORAL | Status: DC | PRN

## 2022-06-10 MED ORDER — LORAZEPAM 1 MG PO TABS: 0.0000 mg | ORAL_TABLET | Freq: Two times a day (BID) | ORAL | Status: AC

## 2022-06-10 MED ORDER — SODIUM CHLORIDE 0.9 % IV SOLN
12.5000 mg | Freq: Three times a day (TID) | INTRAVENOUS | Status: DC | PRN
  Administered 2022-06-11 – 2022-06-15 (×2): 12.5 mg via INTRAVENOUS
  Filled 2022-06-10 (×6): qty 0.5

## 2022-06-10 MED ORDER — IBUPROFEN 400 MG PO TABS: 200.0000 mg | ORAL_TABLET | Freq: Four times a day (QID) | ORAL | Status: DC | PRN

## 2022-06-10 MED ORDER — THIAMINE HCL 100 MG/ML IJ SOLN
100.0000 mg | Freq: Every day | INTRAMUSCULAR | Status: DC
  Administered 2022-06-11 – 2022-06-13 (×3): 100 mg via INTRAVENOUS
  Filled 2022-06-10 (×3): qty 2

## 2022-06-10 MED ORDER — GABAPENTIN 100 MG PO CAPS
100.0000 mg | ORAL_CAPSULE | Freq: Every day | ORAL | Status: DC
  Administered 2022-06-10: 100 mg via ORAL
  Filled 2022-06-10 (×2): qty 1

## 2022-06-10 MED ORDER — LORAZEPAM 2 MG/ML IJ SOLN
1.0000 mg | INTRAMUSCULAR | Status: AC | PRN
  Administered 2022-06-10 – 2022-06-11 (×3): 2 mg via INTRAVENOUS
  Administered 2022-06-11: 1 mg via INTRAVENOUS
  Administered 2022-06-11 (×2): 3 mg via INTRAVENOUS
  Filled 2022-06-10: qty 2
  Filled 2022-06-10 (×4): qty 1

## 2022-06-10 MED ORDER — LIDOCAINE 5 % EX PTCH
1.0000 | MEDICATED_PATCH | CUTANEOUS | Status: DC
  Administered 2022-06-10 – 2022-06-17 (×8): 1 via TRANSDERMAL
  Filled 2022-06-10 (×9): qty 1

## 2022-06-10 MED ORDER — OXYCODONE HCL 5 MG PO TABS
5.0000 mg | ORAL_TABLET | Freq: Four times a day (QID) | ORAL | Status: DC | PRN
  Administered 2022-06-13 – 2022-06-16 (×3): 5 mg via ORAL
  Filled 2022-06-10 (×3): qty 1

## 2022-06-10 MED ORDER — FOLIC ACID 1 MG PO TABS
1.0000 mg | ORAL_TABLET | Freq: Every day | ORAL | Status: DC
  Administered 2022-06-10 – 2022-06-14 (×2): 1 mg via ORAL
  Filled 2022-06-10 (×3): qty 1

## 2022-06-10 MED ORDER — METOPROLOL SUCCINATE ER 25 MG PO TB24
25.0000 mg | ORAL_TABLET | Freq: Every day | ORAL | Status: DC
  Administered 2022-06-10: 25 mg via ORAL
  Filled 2022-06-10 (×2): qty 1

## 2022-06-10 MED ORDER — LORAZEPAM 1 MG PO TABS
0.0000 mg | ORAL_TABLET | Freq: Four times a day (QID) | ORAL | Status: AC
  Administered 2022-06-10: 4 mg via ORAL
  Filled 2022-06-10: qty 3
  Filled 2022-06-10: qty 4
  Filled 2022-06-10: qty 3

## 2022-06-10 MED ORDER — HYDROCHLOROTHIAZIDE 25 MG PO TABS
25.0000 mg | ORAL_TABLET | Freq: Every day | ORAL | Status: DC
  Filled 2022-06-10: qty 1

## 2022-06-10 MED ORDER — LACTULOSE 10 GM/15ML PO SOLN
20.0000 g | Freq: Once | ORAL | Status: AC
  Administered 2022-06-10: 20 g via ORAL

## 2022-06-10 MED ORDER — FAMOTIDINE 20 MG PO TABS
20.0000 mg | ORAL_TABLET | Freq: Every day | ORAL | Status: DC
  Administered 2022-06-10: 20 mg via ORAL
  Filled 2022-06-10: qty 1

## 2022-06-10 MED ORDER — AMLODIPINE BESYLATE 5 MG PO TABS
2.5000 mg | ORAL_TABLET | Freq: Every day | ORAL | Status: DC
  Administered 2022-06-10: 2.5 mg via ORAL
  Filled 2022-06-10 (×2): qty 1

## 2022-06-10 MED ORDER — THIAMINE MONONITRATE 100 MG PO TABS
100.0000 mg | ORAL_TABLET | Freq: Every day | ORAL | Status: DC
  Administered 2022-06-10 – 2022-06-15 (×3): 100 mg via ORAL
  Filled 2022-06-10 (×5): qty 1

## 2022-06-10 MED ORDER — HYDRALAZINE HCL 20 MG/ML IJ SOLN: 5.0000 mg | INTRAMUSCULAR | Status: DC | PRN

## 2022-06-10 MED ORDER — ONDANSETRON HCL 4 MG/2ML IJ SOLN: 4.0000 mg | Freq: Three times a day (TID) | INTRAMUSCULAR | Status: DC | PRN

## 2022-06-10 MED ORDER — FUROSEMIDE 10 MG/ML IJ SOLN
40.0000 mg | Freq: Once | INTRAMUSCULAR | Status: AC
  Administered 2022-06-10: 40 mg via INTRAVENOUS
  Filled 2022-06-10: qty 4

## 2022-06-10 MED ORDER — SODIUM ZIRCONIUM CYCLOSILICATE 5 G PO PACK
5.0000 g | PACK | Freq: Once | ORAL | Status: AC
  Administered 2022-06-10: 5 g via ORAL
  Filled 2022-06-10: qty 1

## 2022-06-10 MED ORDER — TAMSULOSIN HCL 0.4 MG PO CAPS
0.4000 mg | ORAL_CAPSULE | Freq: Every day | ORAL | Status: DC
  Administered 2022-06-10 – 2022-06-14 (×2): 0.4 mg via ORAL
  Filled 2022-06-10 (×3): qty 1

## 2022-06-10 MED ORDER — LACTULOSE 10 GM/15ML PO SOLN
20.0000 g | Freq: Three times a day (TID) | ORAL | Status: DC
  Administered 2022-06-10 (×2): 20 g via ORAL
  Filled 2022-06-10 (×3): qty 30

## 2022-06-10 MED ORDER — ENSURE ENLIVE PO LIQD
237.0000 mL | Freq: Two times a day (BID) | ORAL | Status: DC
  Administered 2022-06-10: 237 mL via ORAL

## 2022-06-10 MED ORDER — LORAZEPAM 1 MG PO TABS: 1.0000 mg | ORAL_TABLET | ORAL | Status: AC | PRN

## 2022-06-10 MED ORDER — INSULIN ASPART 100 UNIT/ML IJ SOLN
0.0000 [IU] | Freq: Every day | INTRAMUSCULAR | Status: DC
  Administered 2022-06-10: 3 [IU] via SUBCUTANEOUS
  Administered 2022-06-11: 2 [IU] via SUBCUTANEOUS
  Filled 2022-06-10 (×2): qty 1

## 2022-06-10 MED ORDER — INSULIN ASPART 100 UNIT/ML IJ SOLN
0.0000 [IU] | Freq: Three times a day (TID) | INTRAMUSCULAR | Status: DC
  Administered 2022-06-10 – 2022-06-11 (×3): 5 [IU] via SUBCUTANEOUS
  Administered 2022-06-11 – 2022-06-12 (×2): 3 [IU] via SUBCUTANEOUS
  Administered 2022-06-12: 2 [IU] via SUBCUTANEOUS
  Administered 2022-06-12: 3 [IU] via SUBCUTANEOUS
  Administered 2022-06-13: 2 [IU] via SUBCUTANEOUS
  Administered 2022-06-13: 3 [IU] via SUBCUTANEOUS
  Administered 2022-06-13 – 2022-06-15 (×7): 2 [IU] via SUBCUTANEOUS
  Administered 2022-06-16: 1 [IU] via SUBCUTANEOUS
  Administered 2022-06-16: 3 [IU] via SUBCUTANEOUS
  Filled 2022-06-10 (×18): qty 1

## 2022-06-10 MED ORDER — ADULT MULTIVITAMIN W/MINERALS CH
1.0000 | ORAL_TABLET | Freq: Every day | ORAL | Status: DC
  Administered 2022-06-10 – 2022-06-14 (×2): 1 via ORAL
  Filled 2022-06-10 (×3): qty 1

## 2022-06-10 NOTE — ED Notes (Addendum)
First Nurse Note: Pt to ED via [facility] from home for AMS and decreased mobility. Pts CBG was 344, pt was Sinus rhythm on the monitor, SpO2 of 99% on room air, pt has hx/o ETOH abuse. Per EMS pt lives with his brother and his brother is having increased difficulty taking care of pt. EMS reports that pt has bed bugs.

## 2022-06-10 NOTE — ED Triage Notes (Signed)
See first nurse note  Here for altered mental status

## 2022-06-10 NOTE — ED Provider Notes (Signed)
[facility] Provider Note    Event Date/Time   First MD Initiated Contact with Patient 06/10/22 1121     (approximate)   History   Altered Mental Status   HPI  Johnny Kelley is a 57 y.o. male with extensive history of alcohol abuse as well as cirrhosis presents to the ER for evaluation of not being able to walk for several weeks.  EMS was called by the patient's brother who wanted him out of the house as he could no longer take care of him.  Patient states he has had some falls has some neck pain.  Denies any abdominal pain.  States he continues to drink 3 beers daily.  Denies any melena no hematochezia.  No dysuria no lower extremity pain or swelling.     Physical Exam   Triage Vital Signs: ED Triage Vitals  Enc Vitals Group     BP      Pulse      Resp      Temp      Temp src      SpO2      Weight      Height      Head Circumference      Peak Flow      Pain Score      Pain Loc      Pain Edu?      Excl. in [place]?     Most recent vital signs: Vitals:   06/10/22 1130  BP: (!) 156/78  Pulse: 83  Resp: 18  Temp: 98.5 F (36.9 C)  SpO2: 94%     Constitutional: Alert  Eyes: Icteric Head: Atraumatic. Nose: No congestion/rhinnorhea. Mouth/Throat: Mucous membranes are moist.   Neck: Painless ROM.  Cardiovascular:   Good peripheral circulation. Respiratory: Normal respiratory effort.  No retractions.  Gastrointestinal: Soft and nontender.  Musculoskeletal:  no deformity Neurologic:  MAE spontaneously. No gross focal neurologic deficits are appreciated.  Skin:  Skin is warm, dry and intact. No rash noted. Psychiatric: Mood and affect are normal. Speech and behavior are normal.    ED Results / Procedures / Treatments   Labs (all labs ordered are listed, but only abnormal results are displayed) Labs Reviewed  CBC WITH DIFFERENTIAL/PLATELET - Abnormal; Notable for the following components:      Result Value   RBC 3.95 (*)     MCV 102.5 (*)    MCH 36.5 (*)    RDW 16.2 (*)    Platelets 36 (*)    All other components within normal limits  COMPREHENSIVE METABOLIC PANEL - Abnormal; Notable for the following components:   Sodium 130 (*)    Potassium 5.3 (*)    Chloride 95 (*)    Glucose, Bld 255 (*)    Calcium 8.4 (*)    Albumin 2.3 (*)    AST 98 (*)    Alkaline Phosphatase 160 (*)    Total Bilirubin 11.7 (*)    All other components within normal limits  AMMONIA - Abnormal; Notable for the following components:   Ammonia 36 (*)    All other components within normal limits  PROTIME-INR - Abnormal; Notable for the following components:   Prothrombin Time 16.3 (*)    INR 1.3 (*)    All other components within normal limits  URINALYSIS, ROUTINE W REFLEX MICROSCOPIC  URINE DRUG SCREEN, QUALITATIVE ([facility] ONLY)     EKG  ED ECG REPORT I, Merlyn Lot, the attending physician, personally  viewed and interpreted this ECG.   Date: 06/10/2022  EKG Time: 12:26  Rate: 85  Rhythm: sinus  Axis: normal;  Intervals:normal qt  ST&T Change: no stemi, no depressions    RADIOLOGY Please see ED Course for my review and interpretation.  I personally reviewed all radiographic images ordered to evaluate for the above acute complaints and reviewed radiology reports and findings.  These findings were personally discussed with the patient.  Please see medical record for radiology report.    PROCEDURES:  Critical Care performed: No  Procedures   MEDICATIONS ORDERED IN ED: Medications  lactulose (CHRONULAC) 10 GM/15ML solution 20 g (has no administration in time range)  LORazepam (ATIVAN) tablet 1-4 mg (has no administration in time range)    Or  LORazepam (ATIVAN) injection 1-4 mg (has no administration in time range)  thiamine (VITAMIN B1) tablet 100 mg (has no administration in time range)    Or  thiamine (VITAMIN B1) injection 100 mg (has no administration in time range)  folic acid (FOLVITE) tablet 1  mg (has no administration in time range)  multivitamin with minerals tablet 1 tablet (has no administration in time range)  LORazepam (ATIVAN) tablet 0-4 mg (has no administration in time range)    Followed by  LORazepam (ATIVAN) tablet 0-4 mg (has no administration in time range)  nicotine (NICODERM CQ - dosed in mg/24 hours) patch 21 mg (has no administration in time range)  ondansetron (ZOFRAN) injection 4 mg (has no administration in time range)  hydrALAZINE (APRESOLINE) injection 5 mg (has no administration in time range)  ibuprofen (ADVIL) tablet 200 mg (has no administration in time range)  insulin aspart (novoLOG) injection 0-9 Units (has no administration in time range)  insulin aspart (novoLOG) injection 0-5 Units (has no administration in time range)  sodium zirconium cyclosilicate (LOKELMA) packet 5 g (has no administration in time range)     IMPRESSION / MDM / ASSESSMENT AND PLAN / ED COURSE  I reviewed the triage vital signs and the nursing notes.                              Differential diagnosis includes, but is not limited to, Dehydration, sepsis, pna, uti, hypoglycemia, cva, drug effect, withdrawal, encephalitis  Patient presenting to the ER for evaluation of symptoms as described above.  Based on symptoms, risk factors and considered above differential, this presenting complaint could reflect a potentially life-threatening illness therefore the patient will be placed on continuous pulse oximetry and telemetry for monitoring.  Laboratory evaluation will be sent to evaluate for the above complaints.  Imaging will be ordered to evaluate for the above differential.  Based on his pressure rotation duration of symptoms I have a high suspicion this is worsening cirrhosis and likely hepatic encephalopathy.   Clinical Course as of 06/10/22 1300  Tue Jun 10, 2022  1213 CT imaging my review and interpretation without evidence of SDH or IPH. [PR]  1226 MELD score of 24. [PR]   [id number] Patient's ammonia is mildly elevated.  He does have asterixis on exam I do suspect a large component of his presentation is possibly secondary to hepatic encephalopathy I have ordered lactulose.  Will consult hospitalist for admission. [PR]    Clinical Course User Index [PR] Merlyn Lot, MD     FINAL CLINICAL IMPRESSION(S) / ED DIAGNOSES   Final diagnoses:  Hepatic encephalopathy ([place])  Hepatic cirrhosis, unspecified hepatic cirrhosis type, unspecified whether  ascites present [facility])     Rx / DC Orders   ED Discharge Orders     None        Note:  This document was prepared using Dragon voice recognition software and may include unintentional dictation errors.    Merlyn Lot, MD 06/10/22 1300

## 2022-06-10 NOTE — H&P (Signed)
History and Physical    Johnny Kelley [id number] DOB: 11/01/1965 DOA: 06/10/2022  Referring MD/NP/PA:   PCP: Venita Lick, NP   Patient coming from:  The patient is coming from home.    Chief Complaint: AMS, fall and back pain  HPI: Johnny Kelley is a 57 y.o. male with medical history significant of hypertension, hyperlipidemia, diabetes mellitus, diastolic CHF, stroke, GERD, BPH, polysubstance abuse (alcohol, tobacco and cocaine), hepatocellular carcinoma (s/p of embolization treatment in [facility] per his brother), liver cirrhosis, who presents with altered mental status, fall and back pain.  Per his brother (I called his brother by phone), patient has chronic back pain with abnormal MRI findings. Pt has an appointment with neurosurgeon, Dr. Cari Caraway on 3/14.  Due to back pain, patient has difficulty walking and decreased mobility.  It is very difficult for his brother to take care of him at home. Patient has been confused in the past several days.  When I saw patient in ED, patient is very lethargic, mildly confused, but still orientated x 3.  He complains of mid and lower back pain, which is constant, severe, nonradiating.  He also has bilateral leg weakness.  No loss control for bladder or bowel movement.  No facial droop or slurred speech.  He does not have chest pain, cough, shortness breath.  Patient has nausea, denies vomiting, diarrhea or abdominal pain.  Patient states that he fell 2 weeks ago, no significant injury.  No loss of consciousness.  He continues to drink alcohol, last drink was this morning.   75M, hx of HTN, HLD, DM, dCHF, stroke, BPH, polysubstance abuse (alcohol, tobacco and cocaine), hepatocellular carcinoma (s/p of embolization treatment in [facility] per his brother), liver cirrhosis, admitted due to AMS which is lilkey hepatic encephalopathy. Pt has chronic back pain with abnormal MRI findings. His brother reported that pt has an appointment with you on 3/14. I  was wondering if you can possibly see him when pt is hospital. Thanks for your consideration.   MRI-L spin and T-spin on 05/06/22: 1. Diffuse marrow heterogeneity is present. Although this can be caused by marrow infiltrative processes, the most common causes include anemia, smoking, obesity, or advancing age. No specific indicators of malignancy or worrisome focal lesion identified. 2. Lumbar spondylosis and degenerative disc disease, causing moderate impingement at L4-5 and mild impingement at L3-4 as noted above. Similar appearance to prior. 3. As on the prior exam, there is a right paracentral disc protrusion at T6-7 flattening the anterior margin of the cord, but with plenty of CSF space posterior to the cord at this level.   Data reviewed independently and ED Course: pt was found to have pneumonia level 36, potassium 5.3, GFR> 60, INR 1.3, PTT 28, negative UDS.  Temperature normal, blood pressure 156/78, heart rate 83, RR 18, oxygen saturation 94% on room air.  Chest x-ray showed low volume and vascular crowding.  CT head and CT of C-spine negative for acute injury.  Patient is admitted to telemetry bed as inpatient.  EKG: I have personally reviewed.  Sinus rhythm, QTc 473, LAD, low voltage.   Review of Systems:   General: no fevers, chills, no body weight gain, has fatigue HEENT: no blurry vision, hearing changes or sore throat Respiratory: no dyspnea, coughing, wheezing CV: no chest pain, no palpitations GI: has nausea, no vomiting, abdominal pain, diarrhea, constipation GU: no dysuria, burning on urination, increased urinary frequency, hematuria  Ext: has trace leg edema Neuro: no unilateral weakness,  numbness, or tingling, no vision change or hearing loss. Has confusion, had fall. Skin: no rash, no skin tear. MSK: has back pain Heme: no easy bruising.  Travel history: No recent long distant travel.   Allergy: No Known Allergies  Past Medical History:  Diagnosis Date    Cancer, hepatocellular ([place])    Diabetes mellitus without complication ([place])    Hepatitis C    Hypertension    Psoriasis    Stroke ([place])    Hx stroke in 2020   Substance abuse [facility])     Past Surgical History:  Procedure Laterality Date   IR RADIOLOGIST EVAL & MGMT  01/19/2020   IR RADIOLOGIST EVAL & MGMT  01/31/2020   IR RADIOLOGIST EVAL & MGMT  04/12/2020   IR RADIOLOGIST EVAL & MGMT  08/30/2020   KNEE SURGERY Right     Social History:  reports that he has been smoking cigarettes. He has a 3.80 pack-year smoking history. He has never used smokeless tobacco. He reports current alcohol use of about 21.0 standard drinks of alcohol per week. He reports that he does not currently use drugs after having used the following drugs: Marijuana and Cocaine. Frequency: 1.00 time per week.  Family History:  Family History  Problem Relation Age of Onset   Diabetes Mother    Colon cancer Father    Diabetes Father    Kidney disease Maternal Grandmother    Lung cancer Maternal Grandfather    Hyperlipidemia Paternal Grandmother    COPD Paternal Grandfather      Prior to Admission medications   Medication Sig Start Date End Date Taking? Authorizing Provider  amLODipine (NORVASC) 2.5 MG tablet Take 1 tablet (2.5 mg total) by mouth daily. 04/10/22   Mecum, Erin E, PA-C  blood glucose meter kit and supplies KIT Dispense based on patient and insurance preference. Use up to four times daily as directed. (FOR ICD-9 250.00, 250.01). 10/27/19   Iloabachie, Chioma E, NP  clopidogrel (PLAVIX) 75 MG tablet clopidogrel 75 mg tablet  TAKE 1 TABLET BY MOUTH EVERY DAY 12/20/20   [provider]  clotrimazole (LOTRIMIN) 1 % cream clotrimazole 1 % topical cream 01/30/21   [provider]  Dulaglutide (TRULICITY) 1.5 0000000 SOPN Inject 1.5 mg into the skin once a week. 03/12/22   Cannady, Henrine Screws T, NP  famotidine (PEPCID) 20 MG tablet TAKE 1 TABLET BY MOUTH EVERYDAY AT BEDTIME 02/07/22   Cannady,  Jolene T, NP  gabapentin (NEURONTIN) 100 MG capsule Take 1 capsule (100 mg total) by mouth daily. 09/24/21   Kathrine Haddock, NP  hydrochlorothiazide (HYDRODIURIL) 25 MG tablet Take 1 tablet (25 mg total) by mouth daily. 03/12/22   Cannady, Henrine Screws T, NP  lidocaine (LIDODERM) 5 % Place 1 patch onto the skin daily. Remove & Discard patch within 12 hours or as directed by MD 12/10/21   Marnee Guarneri T, NP  losartan (COZAAR) 100 MG tablet Take 1 tablet (100 mg total) by mouth daily. 02/11/22   Cannady, Henrine Screws T, NP  meloxicam (MOBIC) 7.5 MG tablet Take 7.5 mg by mouth daily. 09/07/20   [provider]  metFORMIN (GLUCOPHAGE) 1000 MG tablet TAKE 1 TABLET BY MOUTH EVERY DAY 04/11/22   Cannady, Henrine Screws T, NP  metoprolol succinate (TOPROL XL) 25 MG 24 hr tablet Take 1 tablet (25 mg total) by mouth daily. 04/28/22   Kate Sable, MD  solifenacin (VESICARE) 10 MG tablet solifenacin 10 mg tablet  TAKE 1 TABLET (10 MG TOTAL) BY MOUTH  DAILY. MAY CAUSE CONSTIPATION/DRY MOUTH.    [provider]  tamsulosin (FLOMAX) 0.4 MG CAPS capsule Take 1 capsule (0.4 mg total) by mouth daily. 08/23/20   Hollice Espy, MD  terbinafine (LAMISIL) 250 MG tablet Take 1 tablet (250 mg total) by mouth daily. 10/17/21   Ralene Bathe, MD  tiZANidine (ZANAFLEX) 4 MG tablet Take 4 mg by mouth every 6 (six) hours as needed. 01/17/21   [provider]  TOVIAZ 8 MG TB24 tablet Take 8 mg by mouth daily. 12/24/21   [provider]    Physical Exam: Vitals:   06/10/22 1129 06/10/22 1130 06/10/22 1354 06/10/22 1418  BP:  (!) 156/78 (!) 143/77 (!) 142/79  Pulse:  83 83 83  Resp:  18 15   Temp:  98.5 F (36.9 C) 99.4 F (37.4 C) 99.3 F (37.4 C)  TempSrc:  Oral Oral   SpO2:  94% 95% 97%  Weight: 87.5 kg 86.2 kg 86.5 kg   Height: '6\' 1"'$  (1.854 m) '6\' 2"'$  (1.88 m) '6\' 2"'$  (1.88 m)    General: Not in acute distress HEENT:       Eyes: PERRL, EOMI, no scleral icterus.       ENT: No discharge from  the ears and nose, no pharynx injection, no tonsillar enlargement.        Neck: No JVD, no bruit, no mass felt. Heme: No neck lymph node enlargement. Cardiac: S1/S2, RRR, No murmurs, No gallops or rubs. Respiratory: No rales, wheezing, rhonchi or rubs. GI: Soft, nondistended, nontender, no organomegaly, BS present. GU: No hematuria Ext: has trace leg edema bilaterally. 1+DP/PT pulse bilaterally. Musculoskeletal: has tenderness in the mid and lower back Skin: No rashes.  Neuro: Confused, lethargic, but still oriented X3, cranial nerves II-XII grossly intact, moves all extremities, with bilateral leg weakness Psych: Patient is not psychotic, no suicidal or hemocidal ideation.  Labs on Admission: I have personally reviewed following labs and imaging studies  CBC: Recent Labs  Lab 06/10/22 1132  WBC 6.1  NEUTROABS 3.9  HGB 14.4  HCT 40.5  MCV 102.5*  PLT 36*   Basic Metabolic Panel: Recent Labs  Lab 06/10/22 1132  NA 130*  K 5.3*  CL 95*  CO2 22  GLUCOSE 255*  BUN 14  CREATININE 0.61  CALCIUM 8.4*   GFR: Estimated Creatinine Clearance: 117 mL/min (by C-G formula based on SCr of 0.61 mg/dL). Liver Function Tests: Recent Labs  Lab 06/10/22 1132  AST 98*  ALT 40  ALKPHOS 160*  BILITOT 11.7*  PROT 7.3  ALBUMIN 2.3*   No results for input(s): "LIPASE", "AMYLASE" in the last 168 hours. Recent Labs  Lab 06/10/22 1132  AMMONIA 36*   Coagulation Profile: Recent Labs  Lab 06/10/22 1132  INR 1.3*   Cardiac Enzymes: No results for input(s): "CKTOTAL", "CKMB", "CKMBINDEX", "TROPONINI" in the last 168 hours. BNP (last 3 results) No results for input(s): "PROBNP" in the last 8760 hours. HbA1C: No results for input(s): "HGBA1C" in the last 72 hours. CBG: Recent Labs  Lab 06/10/22 1736  GLUCAP 264*   Lipid Profile: No results for input(s): "CHOL", "HDL", "LDLCALC", "TRIG", "CHOLHDL", "LDLDIRECT" in the last 72 hours. Thyroid Function Tests: No results for  input(s): "TSH", "T4TOTAL", "FREET4", "T3FREE", "THYROIDAB" in the last 72 hours. Anemia Panel: No results for input(s): "VITAMINB12", "FOLATE", "FERRITIN", "TIBC", "IRON", "RETICCTPCT" in the last 72 hours. Urine analysis:    Component Value Date/Time   COLORURINE AMBER (A) 06/10/2022 1300  APPEARANCEUR CLEAR (A) 06/10/2022 1300   APPEARANCEUR Hazy (A) 11/27/2020 0943   LABSPEC 1.029 06/10/2022 1300   PHURINE 6.0 06/10/2022 1300   GLUCOSEU >=500 (A) 06/10/2022 1300   HGBUR SMALL (A) 06/10/2022 1300   BILIRUBINUR SMALL (A) 06/10/2022 1300   BILIRUBINUR Negative 11/27/2020 0943   KETONESUR 5 (A) 06/10/2022 1300   PROTEINUR NEGATIVE 06/10/2022 1300   NITRITE NEGATIVE 06/10/2022 1300   LEUKOCYTESUR NEGATIVE 06/10/2022 1300   Sepsis Labs: '@LABRCNTIP'$ (procalcitonin:4,lacticidven:4) )No results found for this or any previous visit (from the past 240 hour(s)).   Radiological Exams on Admission: DG Chest Portable 1 View  Result Date: 06/10/2022 CLINICAL DATA:  Weakness and altered mental status. EXAM: PORTABLE CHEST 1 VIEW COMPARISON:  Chest x-ray 09/24/2018 FINDINGS: The cardiac silhouette, mediastinal and hilar contours are within normal limits. Low lung volumes with vascular crowding and streaky atelectasis but no infiltrates, edema or effusions. No pneumothorax. The bony thorax is intact. IMPRESSION: Low lung volumes with vascular crowding and streaky atelectasis. Electronically Signed   By: Marijo Sanes M.D.   On: 06/10/2022 12:41   CT HEAD WO CONTRAST (5MM)  Result Date: 06/10/2022 CLINICAL DATA:  Mental status change with unknown cause EXAM: CT HEAD WITHOUT CONTRAST CT CERVICAL SPINE WITHOUT CONTRAST TECHNIQUE: Multidetector CT imaging of the head and cervical spine was performed following the standard protocol without intravenous contrast. Multiplanar CT image reconstructions of the cervical spine were also generated. RADIATION DOSE REDUCTION: This exam was performed according to the  departmental dose-optimization program which includes automated exposure control, adjustment of the mA and/or kV according to patient size and/or use of iterative reconstruction technique. COMPARISON:  10/10/2019 brain MRI FINDINGS: CT HEAD FINDINGS Brain: No evidence of acute infarction, hemorrhage, hydrocephalus, extra-axial collection or mass lesion/mass effect. Generalized atrophy. Chronic small vessel ischemia in the cerebral white matter that is extensive. Vascular: Atheromatous calcification and accentuated vessel tortuosity. No hyperdense vessel. Skull: Normal. Negative for fracture or focal lesion. Sinuses/Orbits: No acute finding Other: Intermittent motion artifact. CT CERVICAL SPINE FINDINGS Alignment: Normal. Skull base and vertebrae: No acute fracture. No primary bone lesion or focal pathologic process. Soft tissues and spinal canal: No prevertebral fluid or swelling. No visible canal hematoma. Disc levels:  No significant degenerative changes. Upper chest: No evidence of injury. IMPRESSION: 1. No acute intracranial or cervical spine finding. 2. Premature atrophy and chronic small vessel disease. Electronically Signed   By: Jorje Guild M.D.   On: 06/10/2022 12:22   CT Cervical Spine Wo Contrast  Result Date: 06/10/2022 CLINICAL DATA:  Mental status change with unknown cause EXAM: CT HEAD WITHOUT CONTRAST CT CERVICAL SPINE WITHOUT CONTRAST TECHNIQUE: Multidetector CT imaging of the head and cervical spine was performed following the standard protocol without intravenous contrast. Multiplanar CT image reconstructions of the cervical spine were also generated. RADIATION DOSE REDUCTION: This exam was performed according to the departmental dose-optimization program which includes automated exposure control, adjustment of the mA and/or kV according to patient size and/or use of iterative reconstruction technique. COMPARISON:  10/10/2019 brain MRI FINDINGS: CT HEAD FINDINGS Brain: No evidence of acute  infarction, hemorrhage, hydrocephalus, extra-axial collection or mass lesion/mass effect. Generalized atrophy. Chronic small vessel ischemia in the cerebral white matter that is extensive. Vascular: Atheromatous calcification and accentuated vessel tortuosity. No hyperdense vessel. Skull: Normal. Negative for fracture or focal lesion. Sinuses/Orbits: No acute finding Other: Intermittent motion artifact. CT CERVICAL SPINE FINDINGS Alignment: Normal. Skull base and vertebrae: No acute fracture. No primary bone lesion or focal  pathologic process. Soft tissues and spinal canal: No prevertebral fluid or swelling. No visible canal hematoma. Disc levels:  No significant degenerative changes. Upper chest: No evidence of injury. IMPRESSION: 1. No acute intracranial or cervical spine finding. 2. Premature atrophy and chronic small vessel disease. Electronically Signed   By: Jorje Guild M.D.   On: 06/10/2022 12:22      Assessment/Plan Principal Problem:   Hepatic encephalopathy (HCC) Active Problems:   Cirrhosis ([place])   Fall at home, initial encounter   Hyperkalemia   HTN (hypertension)   History of CVA (cerebrovascular accident)   Chronic diastolic CHF (congestive heart failure) ([place])   Type 2 diabetes mellitus with diabetic neuropathy, without long-term current use of insulin (HCC)   BPH with obstruction/lower urinary tract symptoms   Thrombocytopenia (HCC)   Alcohol abuse   Tobacco abuse   Back pain   Assessment and Plan:  Hepatic encephalopathy ([place]): Patient's altered mental status is possibly due to hepatic encephalopathy.  Ammonia level 36.  CT head negative.  UDS negative.  -Admitted to telemetry bed as inpatient -Frequent neurocheck -Fall precaution -Lactulose 20 g 3 times daily -Repeat ammonia level ammonia -consult TOC  Cirrhosis ([place]):  INR 1.3, PTT 28.  AST 98, ALT 40, total bilirubin 11.7.  Patient continues to drink alcohol. -Avoid using Tylenol -Did counseling about  importance of quitting alcohol drinking  Fall at home, initial encounter: CT head and neck negative. -Fall precaution -PT/OT  Hyperkalemia: Potassium 5.3 -Leukoma 5 g -Patient will be also given 40 mg Lasix due to elevated BNP  HTN (hypertension) -Amlodipine, HCTZ, metoprolol, -Hold losartan due to hyperkalemia  History of CVA (cerebrovascular accident) -Patient is not taking Plavix due to easy bleeding  Chronic diastolic CHF (congestive heart failure) ([place]): 2D echo on 06/24/2021 showed EF of 60 to 65% with grade 1 diastolic dysfunction.  BNP is elevated 418 and chest x-ray with vascular crowding, but patient only has trace leg edema, no JVT, no shortness of breath or oxygen desaturation.  Does not seem to have CHF exacerbation.  Patient is at risk of developing CHF exacerbation -Give 1 dose of Lasix to 40 mg x 1 now -Continue home HCTZ tomorrow  Type 2 diabetes mellitus with diabetic neuropathy, without long-term current use of insulin ([place]): Recent A1c 5.7, well-controlled.  Patient taking Trulicity, metformin and Jardiance. -Sliding scale insulin  BPH with obstruction/lower urinary tract symptoms - Flomax  Thrombocytopenia ([place]): Platelet is 36, this is a chronic issue due to liver cirrhosis. -Follow-up CBC  Alcohol abuse and Tobacco abuse -Did counseling about importance of quitting alcohol use and tobacco use -CIWA protocol -Nicotine patch  Back pain: Patient has chronic mid and lower back pain, with abnormal MRI findings as above.  Patient has an appointment with Dr. Cari Caraway of neurosurgery on 3/14 -Consulted Dr. Cari Caraway for neurosurgery, who kindly agreed to see patient while patient is in the hospital.   DVT ppx: SCD  Code Status: DNR.  Patient has altered mental status, it is not a good time to discuss CODE STATUS with patient now.  I called his brother by phone. Per his brother, pt has expressed his will to his brother several times, stating that he does not  want to be aggressively resuscitated if something bad happens to him.  He wanted to be DNR per his brother. I ordered DNR.  Family Communication:  Yes, patient's brother by phone  Disposition Plan:  Anticipate discharge back to previous environment  Consults called:  Admission status and Level of care: Telemetry Medical:    as inpt      Dispo: The patient is from: Home              Anticipated d/c is to:  To be determined              Anticipated d/c date is: 2 days              Patient currently is not medically stable to d/c.    Severity of Illness:  The appropriate patient status for this patient is INPATIENT. Inpatient status is judged to be reasonable and necessary in order to provide the required intensity of service to ensure the patient's safety. The patient's presenting symptoms, physical exam findings, and initial radiographic and laboratory data in the context of their chronic comorbidities is felt to place them at high risk for further clinical deterioration. Furthermore, it is not anticipated that the patient will be medically stable for discharge from the hospital within 2 midnights of admission.   * I certify that at the point of admission it is my clinical judgment that the patient will require inpatient hospital care spanning beyond 2 midnights from the point of admission due to high intensity of service, high risk for further deterioration and high frequency of surveillance required.*       Date of Service 06/10/2022    Ivor Costa [facility]   If 7PM-7AM, please contact night-coverage [email or web address] 06/10/2022, 6:04 PM

## 2022-06-11 ENCOUNTER — Ambulatory Visit: Payer: Medicaid Other | Admitting: Nurse Practitioner

## 2022-06-11 DIAGNOSIS — E1140 Type 2 diabetes mellitus with diabetic neuropathy, unspecified: Secondary | ICD-10-CM

## 2022-06-11 DIAGNOSIS — R4182 Altered mental status, unspecified: Secondary | ICD-10-CM

## 2022-06-11 DIAGNOSIS — M48061 Spinal stenosis, lumbar region without neurogenic claudication: Secondary | ICD-10-CM

## 2022-06-11 DIAGNOSIS — F1010 Alcohol abuse, uncomplicated: Secondary | ICD-10-CM

## 2022-06-11 DIAGNOSIS — I152 Hypertension secondary to endocrine disorders: Secondary | ICD-10-CM

## 2022-06-11 DIAGNOSIS — G319 Degenerative disease of nervous system, unspecified: Secondary | ICD-10-CM

## 2022-06-11 DIAGNOSIS — Z8673 Personal history of transient ischemic attack (TIA), and cerebral infarction without residual deficits: Secondary | ICD-10-CM

## 2022-06-11 DIAGNOSIS — E1169 Type 2 diabetes mellitus with other specified complication: Secondary | ICD-10-CM

## 2022-06-11 DIAGNOSIS — K7682 Hepatic encephalopathy: Secondary | ICD-10-CM | POA: Diagnosis not present

## 2022-06-11 DIAGNOSIS — C220 Liver cell carcinoma: Secondary | ICD-10-CM

## 2022-06-11 DIAGNOSIS — F17219 Nicotine dependence, cigarettes, with unspecified nicotine-induced disorders: Secondary | ICD-10-CM

## 2022-06-11 LAB — COMPREHENSIVE METABOLIC PANEL
ALT: 38 U/L (ref 0–44)
AST: 92 U/L — ABNORMAL HIGH (ref 15–41)
Albumin: 2.5 g/dL — ABNORMAL LOW (ref 3.5–5.0)
Alkaline Phosphatase: 140 U/L — ABNORMAL HIGH (ref 38–126)
Anion gap: 14 (ref 5–15)
BUN: 21 mg/dL — ABNORMAL HIGH (ref 6–20)
CO2: 25 mmol/L (ref 22–32)
Calcium: 9.5 mg/dL (ref 8.9–10.3)
Chloride: 94 mmol/L — ABNORMAL LOW (ref 98–111)
Creatinine, Ser: 0.9 mg/dL (ref 0.61–1.24)
GFR, Estimated: >60 mL/min (ref >60)
Glucose, Bld: 278 mg/dL — ABNORMAL HIGH (ref 70–99)
Potassium: 3.5 mmol/L (ref 3.5–5.1)
Sodium: 133 mmol/L — ABNORMAL LOW (ref 135–145)
Total Bilirubin: 14.3 mg/dL — ABNORMAL HIGH (ref 0.3–1.2)
Total Protein: 8.1 g/dL (ref 6.5–8.1)

## 2022-06-11 LAB — CBC
HCT: 42.5 % (ref 39.0–52.0)
Hemoglobin: 15 g/dL (ref 13.0–17.0)
MCH: 36.1 pg — ABNORMAL HIGH (ref 26.0–34.0)
MCHC: 35.3 g/dL (ref 30.0–36.0)
MCV: 102.4 fL — ABNORMAL HIGH (ref 80.0–100.0)
Platelets: 40 10*3/uL — ABNORMAL LOW (ref 150–400)
RBC: 4.15 MIL/uL — ABNORMAL LOW (ref 4.22–5.81)
RDW: 16.5 % — ABNORMAL HIGH (ref 11.5–15.5)
WBC: 6.7 10*3/uL (ref 4.0–10.5)
nRBC: 0 % (ref 0.0–0.2)

## 2022-06-11 LAB — GLUCOSE, CAPILLARY
Glucose-Capillary: 266 mg/dL — ABNORMAL HIGH (ref 70–99)
Glucose-Capillary: 280 mg/dL — ABNORMAL HIGH (ref 70–99)
Glucose-Capillary: 218 mg/dL — ABNORMAL HIGH (ref 70–99)
Glucose-Capillary: 228 mg/dL — ABNORMAL HIGH (ref 70–99)

## 2022-06-11 LAB — PHOSPHORUS: Phosphorus: 4.2 mg/dL (ref 2.5–4.6)

## 2022-06-11 LAB — LACTIC ACID, PLASMA
Lactic Acid, Venous: 3 mmol/L (ref 0.5–1.9)
Lactic Acid, Venous: 2.7 mmol/L (ref 0.5–1.9)

## 2022-06-11 LAB — FOLATE: Folate: 14.2 ng/mL (ref >5.9)

## 2022-06-11 LAB — AMMONIA: Ammonia: 18 umol/L (ref 9–35)

## 2022-06-11 LAB — MAGNESIUM: Magnesium: 1.9 mg/dL (ref 1.7–2.4)

## 2022-06-11 LAB — MRSA NEXT GEN BY PCR, NASAL: MRSA by PCR Next Gen: DETECTED — AB

## 2022-06-11 LAB — HIV ANTIBODY (ROUTINE TESTING W REFLEX): HIV Screen 4th Generation wRfx: NONREACTIVE

## 2022-06-11 MED ORDER — SODIUM CHLORIDE 0.9 % IV BOLUS
500.0000 mL | Freq: Once | INTRAVENOUS | Status: AC
  Administered 2022-06-11: 500 mL via INTRAVENOUS

## 2022-06-11 MED ORDER — RIFAXIMIN 550 MG PO TABS: 550.0000 mg | ORAL_TABLET | Freq: Two times a day (BID) | ORAL | Status: DC

## 2022-06-11 MED ORDER — LACTULOSE ENEMA
300.0000 mL | Freq: Every day | ORAL | Status: DC
  Administered 2022-06-11 – 2022-06-12 (×2): 300 mL via RECTAL
  Filled 2022-06-11 (×3): qty 300

## 2022-06-11 MED ORDER — PANTOPRAZOLE SODIUM 40 MG IV SOLR
40.0000 mg | INTRAVENOUS | Status: DC
  Administered 2022-06-11: 40 mg via INTRAVENOUS
  Filled 2022-06-11: qty 10

## 2022-06-11 MED ORDER — MIDODRINE HCL 5 MG PO TABS: 5.0000 mg | ORAL_TABLET | Freq: Three times a day (TID) | ORAL | Status: DC

## 2022-06-11 MED ORDER — VANCOMYCIN HCL 2000 MG/400ML IV SOLN
2000.0000 mg | Freq: Once | INTRAVENOUS | Status: AC
  Administered 2022-06-11: 2000 mg via INTRAVENOUS
  Filled 2022-06-11: qty 400

## 2022-06-11 MED ORDER — LACTULOSE 10 GM/15ML PO SOLN: 20.0000 g | Freq: Every day | ORAL | Status: DC

## 2022-06-11 MED ORDER — INSULIN GLARGINE-YFGN 100 UNIT/ML ~~LOC~~ SOLN
12.0000 [IU] | Freq: Every day | SUBCUTANEOUS | Status: DC
  Administered 2022-06-11 – 2022-06-12 (×2): 12 [IU] via SUBCUTANEOUS
  Filled 2022-06-11 (×2): qty 0.12

## 2022-06-11 MED ORDER — SODIUM CHLORIDE 0.9 % IV SOLN
2.0000 g | INTRAVENOUS | Status: DC
  Administered 2022-06-11 – 2022-06-13 (×3): 2 g via INTRAVENOUS
  Filled 2022-06-11 (×2): qty 2
  Filled 2022-06-11: qty 20
  Filled 2022-06-11: qty 2

## 2022-06-11 MED ORDER — VANCOMYCIN HCL 1500 MG/300ML IV SOLN
1500.0000 mg | Freq: Two times a day (BID) | INTRAVENOUS | Status: DC
  Administered 2022-06-12 – 2022-06-13 (×3): 1500 mg via INTRAVENOUS
  Filled 2022-06-11 (×3): qty 300

## 2022-06-11 NOTE — Progress Notes (Signed)
[facility] Progress Note  Patient: Johnny Kelley    [id number]  DOA: 06/10/2022     Date of Service: the patient was seen and examined on 06/11/2022  Chief Complaint  Patient presents with   Altered Mental Status   Brief hospital course: Johnny Kelley is a 57 y.o. male with medical history significant of hypertension, hyperlipidemia, diabetes mellitus, diastolic CHF, stroke, GERD, BPH, polysubstance abuse (alcohol, tobacco and cocaine), hepatocellular carcinoma (s/p of embolization treatment in [facility] per his brother), liver cirrhosis, who presents with altered mental status, fall and back pain. Per his brother (I called his brother by phone), patient has chronic back pain with abnormal MRI findings. Pt has an appointment with neurosurgeon, Dr. Cari Kelley on 3/14.  Due to back pain, patient has difficulty walking and decreased mobility.  It is very difficult for his brother to take care of him at home. Patient has been confused in the past several days. In the ED  patient was very lethargic, mildly confused, but still orientated x 3.  He did complain of mid and lower back pain, which is constant, severe, nonradiating.  He also has bilateral leg weakness.    Assessment and Plan:  Hepatic encephalopathy ([place]): Patient's altered mental status is possibly due to hepatic encephalopathy.   Ammonia level 36.  CT head negative.  UDS negative. -Frequent neurocheck -Fall precaution -Lactulose 20 g 3 times daily vs lactulose enema daily if patient is unable to take oral lactulose -Repeat ammonia level ammonia -consult TOC Fever could be unknown cause versus SBP Baric antibiotics ceftriaxone 2 g IV daily and vancomycin, pharmacy consulted for dosing and trough monitoring Start rifaximin 550 mg p.o. twice daily when able to take oral medications   Cirrhosis ([place]):  INR 1.3, PTT 28.  AST 98, ALT 40, total bilirubin 11.7.  Patient continues to drink alcohol. -Cautiously use  Tylenol -Did counseling about importance of quitting alcohol drinking   Fall at home, initial encounter: CT head and neck negative. -Fall precaution -PT/OT   Hyperkalemia: Potassium 5.3 -Leukoma 5 g -Patient will be also given 40 mg Lasix due to elevated BNP K 3.5    HTN (hypertension) D/c'd Amlodipine, HCTZ, metoprolol, due to hypotension -Hold losartan due to hyperkalemia Started midodrine 5 mg p.o. 3 times daily available to take oral medications NS 500 mL bolus given We will continue to monitor BP and titrate medications accordingly   History of CVA (cerebrovascular accident) -Patient is not taking Plavix due to easy bleeding   Chronic diastolic CHF (congestive heart failure) ([place]): 2D echo on 06/24/2021 showed EF of 60 to 65% with grade 1 diastolic dysfunction.  BNP is elevated 418 and chest x-ray with vascular crowding, but patient only has trace leg edema, no JVT, no shortness of breath or oxygen desaturation.  Does not seem to have CHF exacerbation.  Patient is at risk of developing CHF exacerbation -Give 1 dose of Lasix to 40 mg x 1 now -Held HCTZ due to low blood pressure   Type 2 diabetes mellitus with diabetic neuropathy, without long-term current use of insulin ([place]): Recent A1c 5.7, well-controlled.  Patient taking Trulicity, metformin and Jardiance. -Sliding scale insulin 3/13 started Semglee 12 units daily  BPH with obstruction/lower urinary tract symptoms - Flomax   Thrombocytopenia ([place]): Platelet is 36, this is a chronic issue due to liver cirrhosis. -Follow-up CBC   Alcohol abuse and Tobacco abuse -Did counseling about importance of quitting alcohol use and tobacco use -CIWA protocol -Nicotine patch  Back pain: Patient has chronic mid and lower back pain, with abnormal MRI findings as above.  Patient has an appointment with Dr. Cari Kelley of neurosurgery on 3/14 -Consulted Dr. Cari Kelley for neurosurgery, who kindly agreed to see patient while patient  is in the hospital.  Body mass index is 24.48 kg/m.  Interventions:    Diet: Heart healthy and carb modified diet DVT Prophylaxis: SCD, pharmacological prophylaxis contraindicated due to thrombocytopenia and bleeding    Advance goals of care discussion: DNR  Family Communication: family was not present at bedside, at the time of interview.  The pt provided permission to discuss medical plan with the family. Opportunity was given to ask question and all questions were answered satisfactorily.  3/13 d/w patient's brother over the phone  Disposition:  Pt is from Home, admitted with AMS, still has encephalopathy, which precludes a safe discharge. Discharge to SNF, when clinically stable.  Subjective: No significant overnight events, patient was admitted due to altered mental status, still patient has significant encephalopathy, patient was mumbling unable to answer any questions, AO x 0.  Patient was moving arms and legs spontaneously.  Physical Exam: General: NAD, lying comfortably Appear in no distress, affect appropriate Eyes: closed , opened briefly ENT: Oral Mucosa Clear, moist  Neck: no JVD,  Cardiovascular: S1 and S2 Present, no Murmur,  Respiratory: good respiratory effort, Bilateral Air entry equal and Decreased, no Crackles, no wheezes Abdomen: Bowel Sound present, Soft and no tenderness,  Skin: no rashes Extremities: no Pedal edema, no calf tenderness Neurologic: without any new focal findings Gait not checked due to patient safety concerns  Vitals:   06/11/22 0555 06/11/22 0755 06/11/22 1219 06/11/22 1224  BP: (!) 140/86 115/71 (!) 85/50 (!) 92/59  Pulse: 99 92 80 85  Resp: '18 18 17 15  '$ Temp: 98.8 F (37.1 C) 98.8 F (37.1 C) 98.4 F (36.9 C) 99.1 F (37.3 C)  TempSrc: Oral     SpO2: 97% 97% 97% 94%  Weight:      Height:        Intake/Output Summary (Last 24 hours) at 06/11/2022 1402 Last data filed at 06/11/2022 0055 Gross per 24 hour  Intake --  Output  600 ml  Net -600 ml   Filed Weights   06/10/22 1129 06/10/22 1130 06/10/22 1354  Weight: 87.5 kg 86.2 kg 86.5 kg    Data Reviewed: I have personally reviewed and interpreted daily labs, tele strips, imagings as discussed above. I reviewed all nursing notes, pharmacy notes, vitals, pertinent old records I have discussed plan of care as described above with RN and patient/family.  CBC: Recent Labs  Lab 06/10/22 1132 06/11/22 0718  WBC 6.1 6.7  NEUTROABS 3.9  --   HGB 14.4 15.0  HCT 40.5 42.5  MCV 102.5* 102.4*  PLT 36* 40*   Basic Metabolic Panel: Recent Labs  Lab 06/10/22 1132 06/10/22 1920 06/11/22 0718 06/11/22 1054  NA 130*  --  133*  --   K 5.3* 4.3 3.5  --   CL 95*  --  94*  --   CO2 22  --  25  --   GLUCOSE 255*  --  278*  --   BUN 14  --  21*  --   CREATININE 0.61  --  0.90  --   CALCIUM 8.4*  --  9.5  --   MG  --   --   --  1.9  PHOS  --   --   --  4.2    Studies: No results found.  Scheduled Meds:  feeding supplement  237 mL Oral BID BM   folic acid  1 mg Oral Daily   insulin aspart  0-5 Units Subcutaneous QHS   insulin aspart  0-9 Units Subcutaneous TID WC   insulin glargine-yfgn  12 Units Subcutaneous Daily   lactulose  20 g Oral Daily   Or   lactulose  300 mL Rectal Daily   lidocaine  1 patch Transdermal Q24H   LORazepam  0-4 mg Oral Q6H   Followed by   Derrill Memo ON 06/12/2022] LORazepam  0-4 mg Oral Q12H   midodrine  5 mg Oral TID WC   multivitamin with minerals  1 tablet Oral Daily   nicotine  21 mg Transdermal Daily   pantoprazole (PROTONIX) IV  40 mg Intravenous Q24H   [START ON 06/12/2022] rifaximin  550 mg Oral BID   tamsulosin  0.4 mg Oral Daily   thiamine  100 mg Oral Daily   Or   thiamine  100 mg Intravenous Daily   Continuous Infusions:  cefTRIAXone (ROCEPHIN)  IV     chlorproMAZINE (THORAZINE) 12.5 mg in sodium chloride 0.9 % 25 mL IVPB 12.5 mg (06/11/22 0921)   vancomycin     PRN Meds: chlorproMAZINE (THORAZINE) 12.5 mg in  sodium chloride 0.9 % 25 mL IVPB, hydrALAZINE, ibuprofen, LORazepam **OR** LORazepam, ondansetron (ZOFRAN) IV, oxyCODONE, tiZANidine  Time spent: 55 minutes  Author: Val Riles. MD [facility] 06/11/2022 2:02 PM  To reach On-call, see care teams to locate the attending and reach out to them via [email or web address]. If 7PM-7AM, please contact night-coverage If you still have difficulty reaching the attending provider, please page the [facility] (Director on Call) for [facility] on amion for assistance.

## 2022-06-11 NOTE — Progress Notes (Signed)
OT Cancellation Note  Patient Details Name: Johnny Kelley MRN: [id number] DOB: [birth year]   Cancelled Treatment:    Reason Eval/Treat Not Completed: Fatigue/lethargy limiting ability to participate. OT attempted to engage pt. Pt with eyes half open, but not responding to any commands, not providing any verbal information. Pt slightly shaking. On CIWA protocol.  Will hold OT evaluation until pt able to participate actively.  Vania Rea 06/11/2022, 1:52 PM

## 2022-06-11 NOTE — Inpatient Diabetes Management (Signed)
Inpatient Diabetes Program Recommendations  AACE/ADA: New Consensus Statement on Inpatient Glycemic Control (2015)  Target Ranges:  Prepandial:   less than 140 mg/dL      [facility] postprandial:   less than 180 mg/dL (1-2 hours)      Critically ill patients:  140 - 180 mg/dL   Lab Results  Component Value Date   GLUCAP 280 (H) 06/11/2022   HGBA1C 8.2 (H) 03/12/2022    Latest Reference Range & Units 06/10/22 17:36 06/10/22 20:49 06/11/22 07:52 06/11/22 12:09  Glucose-Capillary 70 - 99 mg/dL 264 (H) 252 (H) 266 (H) 280 (H)  (H): Data is abnormally high   Diabetes history: DM2 Outpatient Diabetes medications: Trulicity 1.5 q week, Jardiance 25 mg qd, Metformin 1 gm qd Current orders for Inpatient glycemic control: Novolog correction 0-9 units tid, 0-5 units hs  Inpatient Diabetes Program Recommendations:   Please consider while oral diabetes meds held: Semglee 12 units (0.15 units/kg x 86.5 kg=13 units)  May evaluate use of Metformin @ discharge due to cirrhosis.  Thank you, Nani Gasser. Dayson Aboud, RN, MSN, CDE  Diabetes Coordinator Inpatient Glycemic Control Team Team Pager [phone] (8am-5pm) 06/11/2022 1:30 PM

## 2022-06-11 NOTE — Consult Note (Signed)
Consult requested by:  Dr. Dwyane Dee  Consult requested for:  Lumbar stenosis  Primary Physician:  Venita Lick, NP  History of Present Illness: 06/11/2022 Johnny Kelley is here for altered mental status.  He also reportedly has symptoms from lumbar stenosis, but is unable to participate in history currently.     Review of Systems:  A 10 point review of systems is negative, except for the pertinent positives and negatives detailed in the HPI.  Past Medical History: Past Medical History:  Diagnosis Date   Cancer, hepatocellular (Johnny Kelley)    Diabetes mellitus without complication (Johnny Kelley)    Hepatitis C    Hypertension    Psoriasis    Stroke (Johnny Kelley)    Hx stroke in 2020   Substance abuse [facility])     Past Surgical History: Past Surgical History:  Procedure Laterality Date   IR RADIOLOGIST EVAL & MGMT  01/19/2020   IR RADIOLOGIST EVAL & MGMT  01/31/2020   IR RADIOLOGIST EVAL & MGMT  04/12/2020   IR RADIOLOGIST EVAL & MGMT  08/30/2020   KNEE SURGERY Right     Allergies: Allergies as of 06/10/2022   (No Known Allergies)    Medications: Current Meds  Medication Sig   amLODipine (NORVASC) 2.5 MG tablet Take 1 tablet (2.5 mg total) by mouth daily.   clotrimazole (LOTRIMIN) 1 % cream clotrimazole 1 % topical cream   Dulaglutide (TRULICITY) A999333 0000000 SOPN Inject 0.75 mg into the skin once a week. On Monday   Dulaglutide (TRULICITY) 1.5 0000000 SOPN Inject 1.5 mg into the skin once a week.   empagliflozin (JARDIANCE) 25 MG TABS tablet Take 25 mg by mouth daily.   famotidine (PEPCID) 20 MG tablet TAKE 1 TABLET BY MOUTH EVERYDAY AT BEDTIME   gabapentin (NEURONTIN) 100 MG capsule Take 1 capsule (100 mg total) by mouth daily.   hydrochlorothiazide (HYDRODIURIL) 25 MG tablet Take 1 tablet (25 mg total) by mouth daily.   lidocaine (LIDODERM) 5 % Place 1 patch onto the skin daily. Remove & Discard patch within 12 hours or as directed by MD   losartan (COZAAR) 100 MG tablet  Take 1 tablet (100 mg total) by mouth daily.   meloxicam (MOBIC) 7.5 MG tablet Take 7.5 mg by mouth daily.   metFORMIN (GLUCOPHAGE) 1000 MG tablet TAKE 1 TABLET BY MOUTH EVERY DAY   metoprolol succinate (TOPROL XL) 25 MG 24 hr tablet Take 1 tablet (25 mg total) by mouth daily.   terbinafine (LAMISIL) 250 MG tablet Take 1 tablet (250 mg total) by mouth daily.   tiZANidine (ZANAFLEX) 4 MG tablet Take 4 mg by mouth every 6 (six) hours as needed.    Social History: Social History   Tobacco Use   Smoking status: Every Day    Packs/day: 0.10    Years: 38.00    Total pack years: 3.80    Types: Cigarettes   Smokeless tobacco: Never  Vaping Use   Vaping Use: Never used  Substance Use Topics   Alcohol use: Yes    Alcohol/week: 21.0 standard drinks of alcohol    Types: 21 Cans of beer per week    Comment: 2-3 beers a day   Drug use: Not Currently    Frequency: 1.0 times per week    Types: Marijuana, Cocaine    Comment: reports marijuana use once per week and cocaine use 1-2 monthper month    Family Medical History: Family History  Problem Relation Age of Onset  Diabetes Mother    Colon cancer Father    Diabetes Father    Kidney disease Maternal Grandmother    Lung cancer Maternal Grandfather    Hyperlipidemia Paternal Grandmother    COPD Paternal Grandfather     Physical Examination: Vitals:   06/11/22 1526 06/11/22 1716  BP: 120/69 121/71  Pulse: 92 90  Resp: 19   Temp: 98 F (36.7 C)   SpO2: 95%     General: Patient is well developed, well nourished. He is answering some questions but is not interactive.  Respiratory: Patient is breathing without any difficulty.   NEUROLOGICAL:     He regards examiner but does not follow commands.  He is not oriented.  Gait is untested.     Medical Decision Making  Imaging: MRI L spine 05/06/22 IMPRESSION: 1. Diffuse marrow heterogeneity is present. Although this can be caused by marrow infiltrative processes, the most  common causes include anemia, smoking, obesity, or advancing age. No specific indicators of malignancy or worrisome focal lesion identified. 2. Lumbar spondylosis and degenerative disc disease, causing moderate impingement at L4-5 and mild impingement at L3-4 as noted above. Similar appearance to prior. 3. As on the prior exam, there is a right paracentral disc protrusion at T6-7 flattening the anterior margin of the cord, but with plenty of CSF space posterior to the cord at this level.     Electronically Signed   By: Van Clines M.D.   On: 05/06/2022 17:15  I have personally reviewed the images and agree with the above interpretation.  Assessment and Plan: Johnny Kelley is a pleasant 57 y.o. male with lumbar stenosis.  He is currently either in withdrawal or having some other cause of altered mental status.  I am unable to provide recommendations at this time as he is not examinable.  I have communicated with his hospitalist.  They will recontact me when his sensorium has cleared.       Laurell Coalson K. Izora Ribas MD, [facility] Neurosurgery

## 2022-06-11 NOTE — Evaluation (Signed)
Physical Therapy Evaluation Patient Details Name: Johnny Kelley MRN: [id number] DOB: Apr 29, 1965 Today's Date: 06/11/2022  History of Present Illness  Patient is a 57 year old male with presents with altered mental status, fall and back pain. History of hypertension, hyperlipidemia, diabetes mellitus, diastolic CHF, stroke, GERD, BPH, polysubstance abuse, hepatocellular carcinoma.   Clinical Impression  Patient is lethargic. He is able to tell me his name, birth date, and hospital. Significant delay with following commands. He required total assistance for bed mobility and was unable to sit fully upright despite significant assistance provided from therapist. His neck is rotated to the right and flexed and difficulty maintaining neutral position without positioning assistance. Anticipate the patient will nee frequent or constant supervision/assistance at discharge. PT will continue to follow to maximize independence and decrease caregiver burden.      Recommendations for follow up therapy are one component of a multi-disciplinary discharge planning process, led by the attending physician.  Recommendations may be updated based on patient status, additional functional criteria and insurance authorization.  Follow Up Recommendations Skilled nursing-short term rehab (<3 hours/day) Can patient physically be transported by private vehicle: No    Assistance Recommended at Discharge Frequent or constant Supervision/Assistance  Patient can return home with the following  Two people to help with walking and/or transfers;A lot of help with bathing/dressing/bathroom;Help with stairs or ramp for entrance;Assist for transportation;Assistance with feeding;Direct supervision/assist for medications management;Direct supervision/assist for financial management;Assistance with cooking/housework    Equipment Recommendations None recommended by PT (to be determined at next level of care)  Recommendations for  Other Services       Functional Status Assessment Patient has had a recent decline in their functional status and demonstrates the ability to make significant improvements in function in a reasonable and predictable amount of time.     Precautions / Restrictions Precautions Precautions: Fall Restrictions Weight Bearing Restrictions: No      Mobility  Bed Mobility Overal bed mobility: Needs Assistance Bed Mobility: Supine to Sit, Sit to Supine     Supine to sit: Total assist, HOB elevated     General bed mobility comments: patient able to come to partial sitting position with total assistance required. unable to achieve full upright trunk position despite heavy assistance provided    Transfers                        Ambulation/Gait                  Stairs            Wheelchair Mobility    Modified Rankin (Stroke Patients Only)       Balance                                             Pertinent Vitals/Pain Pain Assessment Pain Assessment: Faces Faces Pain Scale: Hurts little more Pain Location: hips with movement Pain Descriptors / Indicators: Grimacing Pain Intervention(s): Limited activity within patient's tolerance, Monitored during session, Repositioned    Home Living Family/patient expects to be discharged to:: Private residence Living Arrangements: Other relatives                 Additional Comments: patient is unable to provide information. per notes, patient lives with brother who is having difficulty managing at home    Prior Function  Prior Level of Function : Needs assist;History of Falls (last six months)             Mobility Comments: has mostly been in bed recently per notes. patient unable to state at this time       Hand Dominance        Extremity/Trunk Assessment   Upper Extremity Assessment Upper Extremity Assessment: Generalized weakness    Lower Extremity Assessment Lower  Extremity Assessment: Generalized weakness    Cervical / Trunk Assessment Cervical / Trunk Assessment: Other exceptions Cervical / Trunk Exceptions: head/neck rotated to right and flexed. patient has difficulty keeping neck in midline without positioning/assistance  Communication   Communication:  (minimal communication)  Cognition Arousal/Alertness: Lethargic Behavior During Therapy: Flat affect Overall Cognitive Status: No family/caregiver present to determine baseline cognitive functioning                                 General Comments: patient has eyes closed for some of the session and is mostly non verbal. occasional agitation with pulling gown off. he is able to tell me is name, birthdate, and hospital. significant delay with following commands with tactile, verbal, and visual cues provided        General Comments      Exercises     Assessment/Plan    PT Assessment Patient needs continued PT services  PT Problem List Decreased strength;Decreased range of motion;Decreased activity tolerance;Decreased balance;Decreased mobility;Decreased cognition;Decreased safety awareness       PT Treatment Interventions DME instruction;Gait training;Stair training;Functional mobility training;Therapeutic activities;Therapeutic exercise;Balance training;Neuromuscular re-education;Patient/family education;Cognitive remediation;Wheelchair mobility training    PT Goals (Current goals can be found in the Care Plan section)  Acute Rehab PT Goals Patient Stated Goal: patient unable to state PT Goal Formulation: Patient unable to participate in goal setting Time For Goal Achievement: 06/25/22 Potential to Achieve Goals: Fair    Frequency Min 2X/week     Co-evaluation               AM-PAC PT "6 Clicks" Mobility  Outcome Measure Help needed turning from your back to your side while in a flat bed without using bedrails?: Total Help needed moving from lying on your  back to sitting on the side of a flat bed without using bedrails?: Total Help needed moving to and from a bed to a chair (including a wheelchair)?: Total Help needed standing up from a chair using your arms (e.g., wheelchair or bedside chair)?: Total Help needed to walk in hospital room?: Total Help needed climbing 3-5 steps with a railing? : Total 6 Click Score: 6    End of Session   Activity Tolerance: Patient limited by fatigue;Patient limited by lethargy Patient left: in bed;with call bell/phone within reach;with bed alarm set Nurse Communication: Mobility status PT Visit Diagnosis: Unsteadiness on feet (R26.81);Muscle weakness (generalized) (M62.81)    Time: 0950-1002 PT Time Calculation (min) (ACUTE ONLY): 12 min   Charges:   PT Evaluation $PT Eval Low Complexity: 1 Low          Minna Merritts, PT, MPT   Percell Locus 06/11/2022, 10:59 AM

## 2022-06-11 NOTE — Progress Notes (Signed)
Patient's brother here stating he lives beside patient not with brother.

## 2022-06-11 NOTE — Progress Notes (Signed)
Pharmacy Antibiotic Note  Johnny Kelley is a 57 y.o. male admitted on 06/10/2022 with hepatic encephalopathy and fever of unknown source with concern for SBP. Chest x ray vascular crowding. MRSA PCR detected. Pharmacy has been consulted for vancomycin dosing.  Renal function at baseline. Day 1 of antibiotics.  Plan: Give vancomycin IV 2,000 mg loading dose Plan for vancomycin IV 1,500 mg every 12 hours maintenance dose Goal AUC 400-600 Estimated AUC 530.9, Cmin 14.0 Used Scr 0.9, Vd 0.72, IBW  Height: '6\' 2"'$  (188 cm) Weight: 86.5 kg (190 lb 11.2 oz) IBW/kg (Calculated) : 82.2  Temp (24hrs), Avg:99.2 F (37.3 C), Min:98.4 F (36.9 C), Max:100.4 F (38 C)  Recent Labs  Lab 06/10/22 1132 06/11/22 0718  WBC 6.1 6.7  CREATININE 0.61 0.90    Estimated Creatinine Clearance: 104 mL/min (by C-G formula based on SCr of 0.9 mg/dL).    No Known Allergies  Antimicrobials this admission: vancomycin 3/13 >>  ceftriaxone 3/13 >>  Dose adjustments this admission: N/a  Microbiology results: 3/13 BCx: in process 3/12 MRSA PCR: detected  Thank you for allowing pharmacy to be a part of this patient's care.  Glean Salvo, PharmD, BCPS Clinical Pharmacist  06/11/2022 2:56 PM

## 2022-06-12 ENCOUNTER — Ambulatory Visit: Payer: Self-pay | Admitting: Neurosurgery

## 2022-06-12 DIAGNOSIS — K7682 Hepatic encephalopathy: Secondary | ICD-10-CM | POA: Diagnosis not present

## 2022-06-12 LAB — BASIC METABOLIC PANEL
Anion gap: 9 (ref 5–15)
BUN: 28 mg/dL — ABNORMAL HIGH (ref 6–20)
CO2: 23 mmol/L (ref 22–32)
Calcium: 8.9 mg/dL (ref 8.9–10.3)
Chloride: 101 mmol/L (ref 98–111)
Creatinine, Ser: 0.93 mg/dL (ref 0.61–1.24)
GFR, Estimated: >60 mL/min (ref >60)
Glucose, Bld: 229 mg/dL — ABNORMAL HIGH (ref 70–99)
Potassium: 3.6 mmol/L (ref 3.5–5.1)
Sodium: 133 mmol/L — ABNORMAL LOW (ref 135–145)

## 2022-06-12 LAB — HEPATIC FUNCTION PANEL
ALT: 35 U/L (ref 0–44)
AST: 80 U/L — ABNORMAL HIGH (ref 15–41)
Albumin: 2.1 g/dL — ABNORMAL LOW (ref 3.5–5.0)
Alkaline Phosphatase: 107 U/L (ref 38–126)
Bilirubin, Direct: 6.3 mg/dL — ABNORMAL HIGH (ref 0.0–0.2)
Indirect Bilirubin: 6 mg/dL — ABNORMAL HIGH (ref 0.3–0.9)
Total Bilirubin: 12.3 mg/dL — ABNORMAL HIGH (ref 0.3–1.2)
Total Protein: 7 g/dL (ref 6.5–8.1)

## 2022-06-12 LAB — CBC
HCT: 37.6 % — ABNORMAL LOW (ref 39.0–52.0)
Hemoglobin: 13.3 g/dL (ref 13.0–17.0)
MCH: 36.3 pg — ABNORMAL HIGH (ref 26.0–34.0)
MCHC: 35.4 g/dL (ref 30.0–36.0)
MCV: 102.7 fL — ABNORMAL HIGH (ref 80.0–100.0)
Platelets: 38 10*3/uL — ABNORMAL LOW (ref 150–400)
RBC: 3.66 MIL/uL — ABNORMAL LOW (ref 4.22–5.81)
RDW: 16.8 % — ABNORMAL HIGH (ref 11.5–15.5)
WBC: 7.2 10*3/uL (ref 4.0–10.5)
nRBC: 0 % (ref 0.0–0.2)

## 2022-06-12 LAB — GLUCOSE, CAPILLARY
Glucose-Capillary: 219 mg/dL — ABNORMAL HIGH (ref 70–99)
Glucose-Capillary: 211 mg/dL — ABNORMAL HIGH (ref 70–99)
Glucose-Capillary: 193 mg/dL — ABNORMAL HIGH (ref 70–99)
Glucose-Capillary: 160 mg/dL — ABNORMAL HIGH (ref 70–99)

## 2022-06-12 LAB — PHOSPHORUS: Phosphorus: 3.7 mg/dL (ref 2.5–4.6)

## 2022-06-12 LAB — MAGNESIUM: Magnesium: 1.9 mg/dL (ref 1.7–2.4)

## 2022-06-12 MED ORDER — INSULIN GLARGINE-YFGN 100 UNIT/ML ~~LOC~~ SOLN
6.0000 [IU] | Freq: Once | SUBCUTANEOUS | Status: AC
  Administered 2022-06-12: 6 [IU] via SUBCUTANEOUS
  Filled 2022-06-12: qty 0.06

## 2022-06-12 MED ORDER — MUPIROCIN 2 % EX OINT
1.0000 | TOPICAL_OINTMENT | Freq: Two times a day (BID) | CUTANEOUS | Status: AC
  Administered 2022-06-12 – 2022-06-16 (×9): 1 via NASAL
  Filled 2022-06-12: qty 22

## 2022-06-12 MED ORDER — ACETAMINOPHEN 500 MG PO TABS: 500.0000 mg | ORAL_TABLET | Freq: Four times a day (QID) | ORAL | Status: DC | PRN

## 2022-06-12 MED ORDER — INSULIN GLARGINE-YFGN 100 UNIT/ML ~~LOC~~ SOLN
18.0000 [IU] | Freq: Every day | SUBCUTANEOUS | Status: DC
  Administered 2022-06-13: 18 [IU] via SUBCUTANEOUS
  Filled 2022-06-12 (×2): qty 0.18

## 2022-06-12 MED ORDER — PANTOPRAZOLE SODIUM 40 MG PO TBEC: 40.0000 mg | DELAYED_RELEASE_TABLET | Freq: Every day | ORAL | Status: DC

## 2022-06-12 NOTE — Progress Notes (Signed)
Pharmacy Antibiotic Note  Johnny Kelley is a 57 y.o. male admitted on 06/10/2022 with hepatic encephalopathy and fever of unknown source on ceftriaxone with concern for SBP. Chest x ray vascular crowding. MRSA PCR detected. Pharmacy has been consulted for vancomycin dosing.  Renal function at baseline. Day 2 of antibiotics.  Plan: Continue vancomycin IV 1,500 mg every 12 hours maintenance dose Goal AUC 400-600 Estimated AUC 456.4, Cmin 12.2 Used Scr 0.93, Vd 0.72, IBW  Also on ceftriaxone 2 grams every 24 hours  Height: '6\' 2"'$  (188 cm) Weight: 86.5 kg (190 lb 11.2 oz) IBW/kg (Calculated) : 82.2  Temp (24hrs), Avg:98.1 F (36.7 C), Min:97.6 F (36.4 C), Max:99.1 F (37.3 C)  Recent Labs  Lab 06/10/22 1132 06/11/22 0718 06/11/22 1333 06/11/22 1623 06/12/22 0627  WBC 6.1 6.7  --   --  7.2  CREATININE 0.61 0.90  --   --  0.93  LATICACIDVEN  --   --  3.0* 2.7*  --      Estimated Creatinine Clearance: 100.7 mL/min (by C-G formula based on SCr of 0.93 mg/dL).    No Known Allergies  Antimicrobials this admission: vancomycin 3/13 >>  ceftriaxone 3/13 >>  Dose adjustments this admission: N/a  Microbiology results: 3/13 BCx: NG < 24 hours 3/12 MRSA PCR: detected  Thank you for allowing pharmacy to be a part of this patient's care.  Glean Salvo, PharmD, BCPS Clinical Pharmacist  06/12/2022 10:22 AM

## 2022-06-12 NOTE — Progress Notes (Signed)
PHARMACIST - PHYSICIAN COMMUNICATION  DR: Dwyane Dee  CONCERNING: IV to Oral Route Change Policy  RECOMMENDATION: This patient is receiving pantoprazole by the intravenous route.  Based on criteria approved by the Pharmacy and Therapeutics Committee, the intravenous medication is being converted to the equivalent oral dose form.   DESCRIPTION: These criteria include: The patient is eating (either orally or via tube) and/or has been taking other orally administered medications for a least 24 hours The patient has no evidence of active gastrointestinal bleeding or impaired GI absorption (gastrectomy, short bowel, patient on TNA or NPO).  If you have questions about this conversion, please contact the Pharmacy Department  '[]'$   [phone] )  [facility] '[x]'$   [phone] )  [facility] '[]'$   [phone] )  [facility] '[]'$   [phone] )  [facility] '[]'$   [phone] )  [place], PharmD, BCPS Clinical Pharmacist  06/12/2022 10:27 AM

## 2022-06-12 NOTE — Evaluation (Signed)
Occupational Therapy Evaluation Patient Details Name: Johnny Kelley MRN: [id number] DOB: Sep 24, 1965 Today's Date: 06/12/2022   History of Present Illness Patient is a 57 year old male with presents with altered mental status, fall and back pain. History of hypertension, hyperlipidemia, diabetes mellitus, diastolic CHF, stroke, GERD, BPH, polysubstance abuse, hepatocellular carcinoma.   Clinical Impression   Patient presenting with decreased Ind in self care, balance, functional mobility/transfers, endurance, and safety awareness.  Pt is very lethargic during session. OT providing total A for bed mobility and pt just grunts and groans with movement and gives no effort. Warm cloth placed on pt's face and attempted hand over hand with pt being resistive when physically touched. Eyes open briefly with wash cloth only. Pt unable to follow any commands during session. Per chart review, brother has been helping him some at home but pt lives next door to him. Pt is clearly far from baseline at this time.  Patient will benefit from acute OT to increase overall independence in the areas of ADLs, functional mobility,and safety awareness  in order to safely discharge to next venue of care.      Recommendations for follow up therapy are one component of a multi-disciplinary discharge planning process, led by the attending physician.  Recommendations may be updated based on patient status, additional functional criteria and insurance authorization.   Follow Up Recommendations  Skilled nursing-short term rehab (<3 hours/day)     Assistance Recommended at Discharge Frequent or constant Supervision/Assistance  Patient can return home with the following Two people to help with bathing/dressing/bathroom;Two people to help with walking and/or transfers;Help with stairs or ramp for entrance;Assist for transportation;Assistance with cooking/housework;Direct supervision/assist for financial management;Direct  supervision/assist for medications management    Functional Status Assessment  Patient has had a recent decline in their functional status and demonstrates the ability to make significant improvements in function in a reasonable and predictable amount of time.  Equipment Recommendations  Other (comment) (defer to next venue of care)       Precautions / Restrictions Precautions Precautions: Fall      Mobility Bed Mobility Overal bed mobility: Needs Assistance Bed Mobility: Rolling Rolling: Total assist              Transfers                   General transfer comment: not attemped secondary to safety concerns          ADL either performed or assessed with clinical judgement   ADL Overall ADL's : Needs assistance/impaired                                       General ADL Comments: total A for all functional tasks. Attempted hand over hand but pt is resistive when touched.                  Pertinent Vitals/Pain Pain Assessment Pain Assessment: Faces Faces Pain Scale: Hurts even more Pain Location: All attempted ROM Pain Descriptors / Indicators: Discomfort, Grimacing, Guarding Pain Intervention(s): Limited activity within patient's tolerance, Monitored during session, Repositioned     Hand Dominance Right   Extremity/Trunk Assessment Upper Extremity Assessment Upper Extremity Assessment: Generalized weakness   Lower Extremity Assessment Lower Extremity Assessment: Generalized weakness   Cervical / Trunk Assessment Cervical / Trunk Exceptions: neck rotated to R   Communication Communication Communication: Other (comment) (pt  only grunts and groans)   Cognition Arousal/Alertness: Lethargic Behavior During Therapy: Flat affect Overall Cognitive Status: No family/caregiver present to determine baseline cognitive functioning                                 General Comments: Pt keeps eyes closed and only opens  when therapist uses wet cloth on face in an attempt to alert. Pt is nonverbal and only grunts and groans while therapist moves him for bed mobility and repositions. He is guarded with ROM.                Home Living Family/patient expects to be discharged to:: Private residence Living Arrangements: Alone Available Help at Discharge: Family;Available PRN/intermittently Type of Home: House Home Access: Stairs to enter                         Additional Comments: patient is unable to provide information. per notes, patient lives with brother who is having difficulty managing at home      Prior Functioning/Environment Prior Level of Function : Needs assist;History of Falls (last six months)               ADLs Comments: per chart, pt has recently been mostly in bed. Pt unable to verbalize. His brother reports to staff that he lives next door.        OT Problem List: Decreased strength;Decreased coordination;Decreased cognition;Decreased activity tolerance;Decreased safety awareness;Impaired balance (sitting and/or standing);Decreased knowledge of use of DME or AE      OT Treatment/Interventions: Self-care/ADL training;Therapeutic exercise;Therapeutic activities;Energy conservation;DME and/or AE instruction;Patient/family education;Balance training    OT Goals(Current goals can be found in the care plan section) Acute Rehab OT Goals Patient Stated Goal: none stated OT Goal Formulation: Patient unable to participate in goal setting Time For Goal Achievement: 06/26/22 Potential to Achieve Goals: Fair ADL Goals Pt Will Perform Grooming: with min assist;standing Pt Will Perform Lower Body Dressing: with min assist;sit to/from stand Pt Will Transfer to Toilet: with min assist Pt Will Perform Toileting - Clothing Manipulation and hygiene: with min assist  OT Frequency: Min 2X/week       AM-PAC OT "6 Clicks" Daily Activity     Outcome Measure Help from another person  eating meals?: Total Help from another person taking care of personal grooming?: Total Help from another person toileting, which includes using toliet, bedpan, or urinal?: Total Help from another person bathing (including washing, rinsing, drying)?: Total Help from another person to put on and taking off regular upper body clothing?: Total Help from another person to put on and taking off regular lower body clothing?: Total 6 Click Score: 6   End of Session Nurse Communication: Mobility status  Activity Tolerance: Patient limited by lethargy Patient left: in bed;with call bell/phone within reach;with bed alarm set  OT Visit Diagnosis: Muscle weakness (generalized) (M62.81);Repeated falls (R29.6);History of falling (Z91.81)                Time: 0950-1003 OT Time Calculation (min): 13 min Charges:  OT General Charges $OT Visit: 1 Visit OT Evaluation $OT Eval Moderate Complexity: [address], [place], OTR/L , CBIS ascom [phone]  06/12/22, 11:58 AM

## 2022-06-12 NOTE — Progress Notes (Signed)
Notes of patient during his admission stated that he was alert and oriented x3. Patient is alert but he is not oriented to anyhting. His speech is incoherent, he grabs at the air aimlessly, restless and unable to follow commands. Scored a 15 and 16 on 2 CIWAs assessments throughout the night and received ativan. Had significant tremors. Scored 1 on the PAIND scale. Patient's po intake is poor due to his cognition.

## 2022-06-12 NOTE — Progress Notes (Signed)
[facility] Progress Note  Patient: Johnny Kelley    [id number]  DOA: 06/10/2022     Date of Service: the patient was seen and examined on 06/12/2022  Chief Complaint  Patient presents with   Altered Mental Status   Brief hospital course: Johnny Kelley is a 57 y.o. male with medical history significant of hypertension, hyperlipidemia, diabetes mellitus, diastolic CHF, stroke, GERD, BPH, polysubstance abuse (alcohol, tobacco and cocaine), hepatocellular carcinoma (s/p of embolization treatment in [facility] per his brother), liver cirrhosis, who presents with altered mental status, fall and back pain. Per his brother (I called his brother by phone), patient has chronic back pain with abnormal MRI findings. Pt has an appointment with neurosurgeon, Dr. Cari Caraway on 3/14.  Due to back pain, patient has difficulty walking and decreased mobility.  It is very difficult for his brother to take care of him at home. Patient has been confused in the past several days. In the ED  patient was very lethargic, mildly confused, but still orientated x 3.  He did complain of mid and lower back pain, which is constant, severe, nonradiating.  He also has bilateral leg weakness.    Assessment and Plan:  Hepatic encephalopathy ([place]): Patient's altered mental status is possibly due to hepatic encephalopathy.   Ammonia level 36-18.  CT head negative.  UDS negative. -Frequent neurocheck -Fall precaution -Lactulose 20 g 3 times daily vs lactulose enema daily if patient is unable to take oral lactulose -Repeat ammonia level  -consult TOC Fever could be unknown cause versus SBP Baric antibiotics ceftriaxone 2 g IV daily and vancomycin, pharmacy consulted for dosing and trough monitoring Start rifaximin 550 mg p.o. twice daily when able to take oral medications   Cirrhosis ([place]):  INR 1.3, PTT 28.  AST 98, ALT 40, total bilirubin 11.7.  Patient continues to drink alcohol. -Cautiously use Tylenol -Did  counseling about importance of quitting alcohol drinking   Fall at home, initial encounter: CT head and neck negative. -Fall precaution -PT/OT   Hyperkalemia: Potassium 5.3 -Leukoma 5 g -Patient will be also given 40 mg Lasix due to elevated BNP K 3.5    HTN (hypertension) D/c'd Amlodipine, HCTZ, metoprolol, due to hypotension -Hold losartan due to hyperkalemia Started midodrine 5 mg p.o. 3 times daily available to take oral medications NS 500 mL bolus given We will continue to monitor BP and titrate medications accordingly   History of CVA (cerebrovascular accident) -Patient is not taking Plavix due to easy bleeding   Chronic diastolic CHF (congestive heart failure) ([place]): 2D echo on 06/24/2021 showed EF of 60 to 65% with grade 1 diastolic dysfunction.  BNP is elevated 418 and chest x-ray with vascular crowding, but patient only has trace leg edema, no JVT, no shortness of breath or oxygen desaturation.  Does not seem to have CHF exacerbation.  Patient is at risk of developing CHF exacerbation -Give 1 dose of Lasix to 40 mg x 1 now -Held HCTZ due to low blood pressure   Type 2 diabetes mellitus with diabetic neuropathy, without long-term current use of insulin ([place]): Recent A1c 5.7, well-controlled.  Patient taking Trulicity, metformin and Jardiance. -Sliding scale insulin 3/14 started Semglee 18 units daily  BPH with obstruction/lower urinary tract symptoms - Flomax   Thrombocytopenia ([place]): this is a chronic issue due to liver cirrhosis. Plts 38 -Follow-up CBC   Alcohol abuse and Tobacco abuse -Did counseling about importance of quitting alcohol use and tobacco use -CIWA protocol -Nicotine patch  Back pain: Patient has chronic mid and lower back pain, with abnormal MRI findings as above.  Patient has an appointment with Dr. Cari Caraway of neurosurgery on 3/14 -Consulted Dr. Cari Caraway for neurosurgery, recommended no intervention until his mental status  improves.   Body mass index is 24.48 kg/m.  Interventions:    Diet: Heart healthy and carb modified diet DVT Prophylaxis: SCD, pharmacological prophylaxis contraindicated due to thrombocytopenia and bleeding    Advance goals of care discussion: DNR  Family Communication: family was not present at bedside, at the time of interview.  The pt provided permission to discuss medical plan with the family. Opportunity was given to ask question and all questions were answered satisfactorily.  3/13 d/w patient's brother over the phone  Disposition:  Pt is from Home, admitted with AMS, still has encephalopathy, which precludes a safe discharge. Discharge to SNF, when clinically stable.  Subjective: No significant overnight events, patient is very encephalopathic, he was awake but not alert and unable to offer any complaints and unable to tell me when his name.  Patient's room was security called, it seems that he was called as well, RN adjusted the temperature of the room. We will continue to monitor and continue same treatment for now.   Physical Exam: General: NAD, lying comfortably Appear in no distress Eyes: PERRLA ENT: Oral Mucosa Clear, dry Neck: no JVD,  Cardiovascular: S1 and S2 Present, no Murmur,  Respiratory: good respiratory effort, Bilateral Air entry equal and Decreased, no Crackles, no wheezes Abdomen: Bowel Sound present, Soft and no tenderness,  Skin: no rashes Extremities: no Pedal edema, no calf tenderness Neurologic: without any new focal findings Gait not checked due to patient safety concerns  Vitals:   06/11/22 2232 06/12/22 0515 06/12/22 0754 06/12/22 1145  BP: 126/87 114/68 110/63 130/64  Pulse:  100 99 (!) 101  Resp: '18 19 20 17  '$ Temp: 98.2 F (36.8 C) 97.6 F (36.4 C) 97.8 F (36.6 C) 98 F (36.7 C)  TempSrc: Oral  Oral Oral  SpO2: 96% 96% 97% 95%  Weight:      Height:        Intake/Output Summary (Last 24 hours) at 06/12/2022 1203 Last data filed  at 06/12/2022 1030 Gross per 24 hour  Intake 100 ml  Output 425 ml  Net -325 ml   Filed Weights   06/10/22 1129 06/10/22 1130 06/10/22 1354  Weight: 87.5 kg 86.2 kg 86.5 kg    Data Reviewed: I have personally reviewed and interpreted daily labs, tele strips, imagings as discussed above. I reviewed all nursing notes, pharmacy notes, vitals, pertinent old records I have discussed plan of care as described above with RN and patient/family.  CBC: Recent Labs  Lab 06/10/22 1132 06/11/22 0718 06/12/22 0627  WBC 6.1 6.7 7.2  NEUTROABS 3.9  --   --   HGB 14.4 15.0 13.3  HCT 40.5 42.5 37.6*  MCV 102.5* 102.4* 102.7*  PLT 36* 40* 38*   Basic Metabolic Panel: Recent Labs  Lab 06/10/22 1132 06/10/22 1920 06/11/22 0718 06/11/22 1054 06/12/22 0627  NA 130*  --  133*  --  133*  K 5.3* 4.3 3.5  --  3.6  CL 95*  --  94*  --  101  CO2 22  --  25  --  23  GLUCOSE 255*  --  278*  --  229*  BUN 14  --  21*  --  28*  CREATININE 0.61  --  0.90  --  0.93  CALCIUM 8.4*  --  9.5  --  8.9  MG  --   --   --  1.9 1.9  PHOS  --   --   --  4.2 3.7    Studies: No results found.  Scheduled Meds:  feeding supplement  237 mL Oral BID BM   folic acid  1 mg Oral Daily   insulin aspart  0-5 Units Subcutaneous QHS   insulin aspart  0-9 Units Subcutaneous TID WC   [START ON 06/13/2022] insulin glargine-yfgn  18 Units Subcutaneous Daily   insulin glargine-yfgn  6 Units Subcutaneous Once   lactulose  20 g Oral Daily   Or   lactulose  300 mL Rectal Daily   lidocaine  1 patch Transdermal Q24H   LORazepam  0-4 mg Oral Q12H   midodrine  5 mg Oral TID WC   multivitamin with minerals  1 tablet Oral Daily   mupirocin ointment  1 Application Nasal BID   nicotine  21 mg Transdermal Daily   pantoprazole  40 mg Oral Daily   rifaximin  550 mg Oral BID   tamsulosin  0.4 mg Oral Daily   thiamine  100 mg Oral Daily   Or   thiamine  100 mg Intravenous Daily   Continuous Infusions:  cefTRIAXone  (ROCEPHIN)  IV 2 g (06/11/22 1420)   chlorproMAZINE (THORAZINE) 12.5 mg in sodium chloride 0.9 % 25 mL IVPB 12.5 mg (06/11/22 0921)   vancomycin 1,500 mg (06/12/22 0358)   PRN Meds: acetaminophen, chlorproMAZINE (THORAZINE) 12.5 mg in sodium chloride 0.9 % 25 mL IVPB, hydrALAZINE, LORazepam **OR** LORazepam, ondansetron (ZOFRAN) IV, oxyCODONE, tiZANidine  Time spent: 35 minutes  Author: Val Riles. MD [facility] 06/12/2022 12:03 PM  To reach On-call, see care teams to locate the attending and reach out to them via [email or web address]. If 7PM-7AM, please contact night-coverage If you still have difficulty reaching the attending provider, please page the [facility] (Director on Call) for [facility] on amion for assistance.

## 2022-06-12 NOTE — Inpatient Diabetes Management (Signed)
Inpatient Diabetes Program Recommendations  AACE/ADA: New Consensus Statement on Inpatient Glycemic Control (2015)  Target Ranges:  Prepandial:   less than 140 mg/dL      Peak postprandial:   less than 180 mg/dL (1-2 hours)      Critically ill patients:  140 - 180 mg/dL   Lab Results  Component Value Date   GLUCAP 219 (H) 06/12/2022   HGBA1C 8.2 (H) 03/12/2022    Latest Reference Range & Units 06/11/22 07:52 06/11/22 12:09 06/11/22 16:56 06/11/22 20:44 06/12/22 07:54  Glucose-Capillary 70 - 99 mg/dL 266 (H) 280 (H) 218 (H) 228 (H) 219 (H)  (H): Data is abnormally high  Diabetes history: DM2 Outpatient Diabetes medications: Trulicity 1.5 q week, Jardiance 25 mg qd, Metformin 1 gm qd Current orders for Inpatient glycemic control: Semglee 12 units qd, Novolog correction 0-9 units tid, 0-5 units hs  Inpatient Diabetes Program Recommendations:   Please consider: -Increase Semglee to 18 units (0.2 units/kg x 86.5 kg)   Thank you, Bethena Roys E. Rodgerick Gilliand, RN, MSN, CDE  Diabetes Coordinator Inpatient Glycemic Control Team Team Pager [phone] (8am-5pm) 06/12/2022 11:27 AM

## 2022-06-13 ENCOUNTER — Inpatient Hospital Stay: Payer: Medicaid Other

## 2022-06-13 DIAGNOSIS — K7682 Hepatic encephalopathy: Secondary | ICD-10-CM | POA: Diagnosis not present

## 2022-06-13 LAB — CSF CELL COUNT WITH DIFFERENTIAL
Eosinophils, CSF: 0 %
Eosinophils, CSF: 0 %
Lymphs, CSF: 58 %
Lymphs, CSF: 63 %
Monocyte-Macrophage-Spinal Fluid: 13 %
Monocyte-Macrophage-Spinal Fluid: 6 %
RBC Count, CSF: 0 /mm3 (ref 0–3)
RBC Count, CSF: 31 /mm3 — ABNORMAL HIGH (ref 0–3)
Segmented Neutrophils-CSF: 29 %
Segmented Neutrophils-CSF: 31 %
Tube #: 1
Tube #: 1
WBC, CSF: 148 /mm3 (ref 0–5)
WBC, CSF: 212 /mm3 (ref 0–5)

## 2022-06-13 LAB — BASIC METABOLIC PANEL
Anion gap: 11 (ref 5–15)
Anion gap: 10 (ref 5–15)
BUN: 38 mg/dL — ABNORMAL HIGH (ref 6–20)
BUN: 37 mg/dL — ABNORMAL HIGH (ref 6–20)
CO2: 23 mmol/L (ref 22–32)
CO2: 24 mmol/L (ref 22–32)
Calcium: 8.7 mg/dL — ABNORMAL LOW (ref 8.9–10.3)
Calcium: 8.7 mg/dL — ABNORMAL LOW (ref 8.9–10.3)
Chloride: 103 mmol/L (ref 98–111)
Chloride: 103 mmol/L (ref 98–111)
Creatinine, Ser: 1.08 mg/dL (ref 0.61–1.24)
Creatinine, Ser: 0.92 mg/dL (ref 0.61–1.24)
GFR, Estimated: >60 mL/min (ref >60)
GFR, Estimated: >60 mL/min (ref >60)
Glucose, Bld: 207 mg/dL — ABNORMAL HIGH (ref 70–99)
Glucose, Bld: 174 mg/dL — ABNORMAL HIGH (ref 70–99)
Potassium: 3.2 mmol/L — ABNORMAL LOW (ref 3.5–5.1)
Potassium: 3.2 mmol/L — ABNORMAL LOW (ref 3.5–5.1)
Sodium: 137 mmol/L (ref 135–145)
Sodium: 137 mmol/L (ref 135–145)

## 2022-06-13 LAB — HEPATIC FUNCTION PANEL
ALT: 34 U/L (ref 0–44)
AST: 85 U/L — ABNORMAL HIGH (ref 15–41)
Albumin: 2 g/dL — ABNORMAL LOW (ref 3.5–5.0)
Alkaline Phosphatase: 100 U/L (ref 38–126)
Bilirubin, Direct: 6.5 mg/dL — ABNORMAL HIGH (ref 0.0–0.2)
Indirect Bilirubin: 6.1 mg/dL — ABNORMAL HIGH (ref 0.3–0.9)
Total Bilirubin: 12.6 mg/dL — ABNORMAL HIGH (ref 0.3–1.2)
Total Protein: 6.8 g/dL (ref 6.5–8.1)

## 2022-06-13 LAB — CBC
HCT: 39.4 % (ref 39.0–52.0)
Hemoglobin: 13.8 g/dL (ref 13.0–17.0)
MCH: 36.1 pg — ABNORMAL HIGH (ref 26.0–34.0)
MCHC: 35 g/dL (ref 30.0–36.0)
MCV: 103.1 fL — ABNORMAL HIGH (ref 80.0–100.0)
Platelets: 48 10*3/uL — ABNORMAL LOW (ref 150–400)
RBC: 3.82 MIL/uL — ABNORMAL LOW (ref 4.22–5.81)
RDW: 17.3 % — ABNORMAL HIGH (ref 11.5–15.5)
WBC: 6.4 10*3/uL (ref 4.0–10.5)
nRBC: 0 % (ref 0.0–0.2)

## 2022-06-13 LAB — MENINGITIS/ENCEPHALITIS PANEL (CSF)
Cryptococcus neoformans/gattii (CSF): DETECTED — AB
Cytomegalovirus (CSF): NOT DETECTED
Enterovirus (CSF): NOT DETECTED
Escherichia coli K1 (CSF): NOT DETECTED
Haemophilus influenzae (CSF): NOT DETECTED
Herpes simplex virus 1 (CSF): NOT DETECTED
Herpes simplex virus 2 (CSF): NOT DETECTED
Human herpesvirus 6 (CSF): NOT DETECTED
Human parechovirus (CSF): NOT DETECTED
Listeria monocytogenes (CSF): NOT DETECTED
Neisseria meningitis (CSF): NOT DETECTED
Streptococcus agalactiae (CSF): NOT DETECTED
Streptococcus pneumoniae (CSF): NOT DETECTED
Varicella zoster virus (CSF): NOT DETECTED

## 2022-06-13 LAB — GLUCOSE, CAPILLARY
Glucose-Capillary: 206 mg/dL — ABNORMAL HIGH (ref 70–99)
Glucose-Capillary: 165 mg/dL — ABNORMAL HIGH (ref 70–99)
Glucose-Capillary: 152 mg/dL — ABNORMAL HIGH (ref 70–99)
Glucose-Capillary: 178 mg/dL — ABNORMAL HIGH (ref 70–99)

## 2022-06-13 LAB — MAGNESIUM
Magnesium: 1.9 mg/dL (ref 1.7–2.4)
Magnesium: 1.9 mg/dL (ref 1.7–2.4)

## 2022-06-13 LAB — ACETAMINOPHEN LEVEL: Acetaminophen (Tylenol), Serum: <10 ug/mL — ABNORMAL LOW (ref 10–30)

## 2022-06-13 LAB — SALICYLATE LEVEL: Salicylate Lvl: <7 mg/dL — ABNORMAL LOW (ref 7.0–30.0)

## 2022-06-13 LAB — PROTEIN AND GLUCOSE, CSF
Glucose, CSF: 72 mg/dL — ABNORMAL HIGH (ref 40–70)
Total  Protein, CSF: 195 mg/dL — ABNORMAL HIGH (ref 15–45)

## 2022-06-13 LAB — PHOSPHORUS: Phosphorus: 3.5 mg/dL (ref 2.5–4.6)

## 2022-06-13 MED ORDER — POTASSIUM CHLORIDE CRYS ER 20 MEQ PO TBCR: 40.0000 meq | EXTENDED_RELEASE_TABLET | Freq: Once | ORAL | Status: DC

## 2022-06-13 MED ORDER — DIPHENHYDRAMINE HCL 50 MG/ML IJ SOLN
25.0000 mg | Freq: Every day | INTRAMUSCULAR | Status: DC | PRN
  Administered 2022-06-17: 25 mg via INTRAVENOUS
  Filled 2022-06-13: qty 1

## 2022-06-13 MED ORDER — DIPHENHYDRAMINE HCL 25 MG PO CAPS: 25.0000 mg | ORAL_CAPSULE | Freq: Every day | ORAL | Status: DC | PRN

## 2022-06-13 MED ORDER — ACETAMINOPHEN 325 MG PO TABS: 650.0000 mg | ORAL_TABLET | Freq: Every day | ORAL | Status: DC | PRN

## 2022-06-13 MED ORDER — VANCOMYCIN HCL 1250 MG/250ML IV SOLN
1250.0000 mg | Freq: Two times a day (BID) | INTRAVENOUS | Status: DC
  Administered 2022-06-13 – 2022-06-14 (×2): 1250 mg via INTRAVENOUS
  Filled 2022-06-13 (×2): qty 250

## 2022-06-13 MED ORDER — LORAZEPAM 2 MG/ML IJ SOLN
2.0000 mg | INTRAMUSCULAR | Status: DC | PRN
  Administered 2022-06-13: 2 mg via INTRAVENOUS
  Filled 2022-06-13: qty 1

## 2022-06-13 MED ORDER — LACTULOSE 10 GM/15ML PO SOLN
20.0000 g | Freq: Three times a day (TID) | ORAL | Status: DC
  Administered 2022-06-13 – 2022-06-17 (×9): 20 g
  Filled 2022-06-13 (×11): qty 30

## 2022-06-13 MED ORDER — POTASSIUM CHLORIDE 10 MEQ/100ML IV SOLN: 10.0000 meq | INTRAVENOUS | Status: AC

## 2022-06-13 MED ORDER — RIFAXIMIN 550 MG PO TABS
550.0000 mg | ORAL_TABLET | Freq: Two times a day (BID) | ORAL | Status: DC
  Administered 2022-06-13 – 2022-06-14 (×2): 550 mg
  Filled 2022-06-13 (×2): qty 1

## 2022-06-13 MED ORDER — MEPERIDINE HCL 25 MG/ML IJ SOLN: 25.0000 mg | INTRAMUSCULAR | Status: DC | PRN

## 2022-06-13 MED ORDER — VITAL HIGH PROTEIN PO LIQD: 1000.0000 mL | ORAL | Status: DC

## 2022-06-13 MED ORDER — DEXTROSE 5% FOR FLUSHING BEFORE AND AFTER AMBISOME
10.0000 mL | INTRAVENOUS | Status: DC
  Administered 2022-06-15 – 2022-06-17 (×5): 10 mL via INTRAVENOUS
  Filled 2022-06-13: qty 250
  Filled 2022-06-13 (×3): qty 10
  Filled 2022-06-13: qty 250
  Filled 2022-06-13 (×2): qty 10

## 2022-06-13 MED ORDER — DEXTROSE 5% FOR FLUSHING BEFORE AND AFTER AMBISOME
10.0000 mL | INTRAVENOUS | Status: DC
  Administered 2022-06-14 – 2022-06-18 (×6): 10 mL via INTRAVENOUS
  Filled 2022-06-13 (×3): qty 10
  Filled 2022-06-13: qty 250
  Filled 2022-06-13 (×2): qty 10
  Filled 2022-06-13: qty 250

## 2022-06-13 MED ORDER — SODIUM CHLORIDE 0.9 % IV BOLUS FOR AMBISOME
500.0000 mL | INTRAVENOUS | Status: DC
  Administered 2022-06-14 – 2022-06-18 (×5): 500 mL via INTRAVENOUS

## 2022-06-13 MED ORDER — SODIUM CHLORIDE 0.9 % IV BOLUS FOR AMBISOME
500.0000 mL | INTRAVENOUS | Status: DC
  Administered 2022-06-13 – 2022-06-17 (×5): 500 mL via INTRAVENOUS

## 2022-06-13 MED ORDER — DEXTROSE 5 % IV SOLN
3.0000 mg/kg | Freq: Every day | INTRAVENOUS | Status: DC
  Filled 2022-06-13: qty 65

## 2022-06-13 MED ORDER — METOPROLOL TARTRATE 5 MG/5ML IV SOLN
5.0000 mg | Freq: Once | INTRAVENOUS | Status: AC
  Administered 2022-06-13: 5 mg via INTRAVENOUS
  Filled 2022-06-13: qty 5

## 2022-06-13 MED ORDER — DEXTROSE 5 % IV SOLN
850.0000 mg | Freq: Three times a day (TID) | INTRAVENOUS | Status: DC
  Administered 2022-06-13 – 2022-06-14 (×2): 850 mg via INTRAVENOUS
  Filled 2022-06-13 (×3): qty 17

## 2022-06-13 MED ORDER — LORAZEPAM 2 MG/ML IJ SOLN
2.0000 mg | Freq: Once | INTRAMUSCULAR | Status: AC | PRN
  Administered 2022-06-13: 2 mg via INTRAVENOUS
  Filled 2022-06-13: qty 1

## 2022-06-13 MED ORDER — PANTOPRAZOLE SODIUM 40 MG IV SOLR
40.0000 mg | INTRAVENOUS | Status: DC
  Administered 2022-06-13 – 2022-06-18 (×6): 40 mg via INTRAVENOUS
  Filled 2022-06-13 (×6): qty 10

## 2022-06-13 MED ORDER — SODIUM CHLORIDE 0.9 % IV SOLN: INTRAVENOUS | Status: DC

## 2022-06-13 MED ORDER — FREE WATER
30.0000 mL | Status: DC
  Administered 2022-06-13 – 2022-06-18 (×25): 30 mL

## 2022-06-13 MED ORDER — GLUCERNA 1.5 CAL PO LIQD: 1000.0000 mL | ORAL | Status: DC

## 2022-06-13 MED ORDER — DEXTROSE 5 % IV SOLN
5.0000 mg/kg | Freq: Every day | INTRAVENOUS | Status: DC
  Filled 2022-06-13: qty 107.5

## 2022-06-13 MED ORDER — POTASSIUM CHLORIDE 10 MEQ/100ML IV SOLN
10.0000 meq | INTRAVENOUS | Status: AC
  Administered 2022-06-13 (×4): 10 meq via INTRAVENOUS
  Filled 2022-06-13 (×5): qty 100

## 2022-06-13 MED ORDER — METOPROLOL TARTRATE 5 MG/5ML IV SOLN: 5.0000 mg | Freq: Once | INTRAVENOUS | Status: DC

## 2022-06-13 NOTE — Consult Note (Signed)
Inpatient Consultation   Patient ID: Johnny Kelley is a 57 y.o. male.  Requesting Provider: Val Riles, MD  Date of Admission: 06/27/2022  Date of Consult: 06/13/22   Reason for Consultation: encephalopathy   Patient's Chief Complaint:   Chief Complaint  Patient presents with   Altered Mental Status    Johnny Kelley is a 57 year old Caucasian male with history of decompensated cirrhosis with [place] status post TACE (last appears in Nov 2023), etoh dependence and substance abuse (cocaine, tobacco), cva whom GI is consulted for encephalopathy and hyperbilirubinemia.  Patient is seen in the room and is currently quite lethargic.  He does not answer any questions but is able to open his eyes to voice.  Per nursing no melena or hematochezia.  There has been no emesis.  No signs of GI bleeding.  He underwent ultrasound today to assess for ascites for diagnostic tap for SBP, however, there was not enough fluid to be able to be tapped.  He is currently being covered with antibiotics. Only receiving 1 lactulose enema per day.  He has a history of EtOH dependence-unclear when his last drink was.  He is notably macrocytic.  Platelets are 48,000, total bili is 12.6 AST 85 ALT 34 albumin 2 INR 1.3  Recent MRI (February 6) shows ablation zone [place] TR equivocal and another LiRad 3 lesion  Appears show seen his urologist within the last month and a half and was complaining of urinary symptoms  Denies NSAIDs, Anti-plt agents, and anticoagulants Denies family history of gastrointestinal disease and malignancy Previous Endoscopies: unknown- does not appear to have had egd or colonoscopy     Past Medical History:  Diagnosis Date   Cancer, hepatocellular ([place])    Diabetes mellitus without complication ([place])    Hepatitis C    Hypertension    Psoriasis    Stroke ([place])    Hx stroke in 2020   Substance abuse ([place])     Past Surgical History:  Procedure Laterality Date   IR RADIOLOGIST  EVAL & MGMT  01/19/2020   IR RADIOLOGIST EVAL & MGMT  01/31/2020   IR RADIOLOGIST EVAL & MGMT  04/12/2020   IR RADIOLOGIST EVAL & MGMT  08/30/2020   KNEE SURGERY Right     No Known Allergies  Family History  Problem Relation Age of Onset   Diabetes Mother    Colon cancer Father    Diabetes Father    Kidney disease Maternal Grandmother    Lung cancer Maternal Grandfather    Hyperlipidemia Paternal Grandmother    COPD Paternal Grandfather     Social History   Tobacco Use   Smoking status: Every Day    Packs/day: 0.10    Years: 38.00    Additional pack years: 0.00    Total pack years: 3.80    Types: Cigarettes   Smokeless tobacco: Never  Vaping Use   Vaping Use: Never used  Substance Use Topics   Alcohol use: Yes    Alcohol/week: 21.0 standard drinks of alcohol    Types: 21 Cans of beer per week    Comment: 2-3 beers a day   Drug use: Not Currently    Frequency: 1.0 times per week    Types: Marijuana, Cocaine    Comment: reports marijuana use once per week and cocaine use 1-2 monthper month     Pertinent GI related history and allergies were reviewed with the patient  Review of Systems  Unable to perform ROS: Mental  status change     Medications Home Medications No current facility-administered medications on file prior to encounter.   Current Outpatient Medications on File Prior to Encounter  Medication Sig Dispense Refill   amLODipine (NORVASC) 2.5 MG tablet Take 1 tablet (2.5 mg total) by mouth daily. 30 tablet 1   clotrimazole (LOTRIMIN) 1 % cream clotrimazole 1 % topical cream     Dulaglutide (TRULICITY) A999333 0000000 SOPN Inject 0.75 mg into the skin once a week. On Monday     Dulaglutide (TRULICITY) 1.5 0000000 SOPN Inject 1.5 mg into the skin once a week. 6 mL 4   empagliflozin (JARDIANCE) 25 MG TABS tablet Take 25 mg by mouth daily.     famotidine (PEPCID) 20 MG tablet TAKE 1 TABLET BY MOUTH EVERYDAY AT BEDTIME 90 tablet 1   gabapentin (NEURONTIN) 100  MG capsule Take 1 capsule (100 mg total) by mouth daily. 90 capsule 3   hydrochlorothiazide (HYDRODIURIL) 25 MG tablet Take 1 tablet (25 mg total) by mouth daily. 90 tablet 4   lidocaine (LIDODERM) 5 % Place 1 patch onto the skin daily. Remove & Discard patch within 12 hours or as directed by MD 30 patch 3   losartan (COZAAR) 100 MG tablet Take 1 tablet (100 mg total) by mouth daily. 90 tablet 0   meloxicam (MOBIC) 7.5 MG tablet Take 7.5 mg by mouth daily.     metFORMIN (GLUCOPHAGE) 1000 MG tablet TAKE 1 TABLET BY MOUTH EVERY DAY 90 tablet 0   metoprolol succinate (TOPROL XL) 25 MG 24 hr tablet Take 1 tablet (25 mg total) by mouth daily. 30 tablet 5   terbinafine (LAMISIL) 250 MG tablet Take 1 tablet (250 mg total) by mouth daily. 30 tablet 0   tiZANidine (ZANAFLEX) 4 MG tablet Take 4 mg by mouth every 6 (six) hours as needed.     blood glucose meter kit and supplies KIT Dispense based on patient and insurance preference. Use up to four times daily as directed. (FOR ICD-9 250.00, 250.01). 1 each 0   clopidogrel (PLAVIX) 75 MG tablet clopidogrel 75 mg tablet  TAKE 1 TABLET BY MOUTH EVERY DAY (Patient not taking: Reported on 06/23/2022)     tamsulosin (FLOMAX) 0.4 MG CAPS capsule Take 1 capsule (0.4 mg total) by mouth daily. (Patient not taking: Reported on 06/16/2022) 30 capsule 11   TOVIAZ 8 MG TB24 tablet Take 8 mg by mouth daily. (Patient not taking: Reported on 06/15/2022)     Pertinent GI related medications were reviewed with the patient  Inpatient Medications  Current Facility-Administered Medications:    0.9 %  sodium chloride infusion, , Intravenous, Continuous, Val Riles, MD, Last Rate: 50 mL/hr at 06/13/22 1129, New Bag at 06/13/22 1129   acetaminophen (TYLENOL) tablet 500 mg, 500 mg, Oral, Q6H PRN, Wynelle Cleveland, RPH   cefTRIAXone (ROCEPHIN) 2 g in sodium chloride 0.9 % 100 mL IVPB, 2 g, Intravenous, Q24H, Val Riles, MD, Stopped at 06/12/22 1418   chlorproMAZINE  (THORAZINE) 12.5 mg in sodium chloride 0.9 % 25 mL IVPB, 12.5 mg, Intravenous, Q8H PRN, Ivor Costa, MD, Stopped at 06/11/22 [id number]   feeding supplement (ENSURE ENLIVE / ENSURE PLUS) liquid 237 mL, 237 mL, Oral, BID BM, Ivor Costa, MD, 237 mL at [id number]   folic acid (FOLVITE) tablet 1 mg, 1 mg, Oral, Daily, Ivor Costa, MD, 1 mg at 05/31/2022 1435   hydrALAZINE (APRESOLINE) injection 5 mg, 5 mg, Intravenous, Q2H PRN, Ivor Costa, MD   insulin  aspart (novoLOG) injection 0-5 Units, 0-5 Units, Subcutaneous, QHS, Ivor Costa, MD, 2 Units at 06/11/22 2103   insulin aspart (novoLOG) injection 0-9 Units, 0-9 Units, Subcutaneous, TID WC, Ivor Costa, MD, 3 Units at 06/13/22 0921   insulin glargine-yfgn (SEMGLEE) injection 18 Units, 18 Units, Subcutaneous, Daily, Val Riles, MD, 18 Units at 06/13/22 1031   lactulose (CHRONULAC) 10 GM/15ML solution 20 g, 20 g, Oral, Daily **OR** lactulose (CHRONULAC) enema 200 gm, 300 mL, Rectal, Daily, Val Riles, MD, 300 mL at 06/12/22 1345   lidocaine (LIDODERM) 5 % 1 patch, 1 patch, Transdermal, Q24H, Ivor Costa, MD, 1 patch at 06/12/22 2041   LORazepam (ATIVAN) tablet 1-4 mg, 1-4 mg, Oral, Q1H PRN **OR** LORazepam (ATIVAN) injection 1-4 mg, 1-4 mg, Intravenous, Q1H PRN, Ivor Costa, MD, 3 mg at 06/11/22 2239   [EXPIRED] LORazepam (ATIVAN) tablet 0-4 mg, 0-4 mg, Oral, Q6H, 4 mg at 06/08/2022 1820 **FOLLOWED BY** LORazepam (ATIVAN) tablet 0-4 mg, 0-4 mg, Oral, Q12H, Ivor Costa, MD   multivitamin with minerals tablet 1 tablet, 1 tablet, Oral, Daily, Ivor Costa, MD, 1 tablet at 06/12/2022 1435   mupirocin ointment (BACTROBAN) 2 % 1 Application, 1 Application, Nasal, BID, Val Riles, MD, 1 Application at [id number]   nicotine (NICODERM CQ - dosed in mg/24 hours) patch 21 mg, 21 mg, Transdermal, Daily, Ivor Costa, MD, 21 mg at 06/13/22 1031   ondansetron (ZOFRAN) injection 4 mg, 4 mg, Intravenous, Q8H PRN, Ivor Costa, MD   oxyCODONE (Oxy IR/ROXICODONE) immediate release  tablet 5 mg, 5 mg, Oral, Q6H PRN, Ivor Costa, MD   pantoprazole (PROTONIX) injection 40 mg, 40 mg, Intravenous, Q24H, Dwyane Dee, Dileep, MD   potassium chloride 10 mEq in 100 mL IVPB, 10 mEq, Intravenous, Q1 Hr x 4, Val Riles, MD, Last Rate: 100 mL/hr at 06/13/22 1130, 10 mEq at 06/13/22 1130   rifaximin (XIFAXAN) tablet 550 mg, 550 mg, Oral, BID, Val Riles, MD   tamsulosin [facility]) capsule 0.4 mg, 0.4 mg, Oral, Daily, Ivor Costa, MD, 0.4 mg at 06/12/2022 1826   thiamine (VITAMIN B1) tablet 100 mg, 100 mg, Oral, Daily, 100 mg at 06/03/2022 1435 **OR** thiamine (VITAMIN B1) injection 100 mg, 100 mg, Intravenous, Daily, Ivor Costa, MD, 100 mg at 06/13/22 1028   tiZANidine (ZANAFLEX) tablet 4 mg, 4 mg, Oral, Q6H PRN, Ivor Costa, MD   vancomycin (VANCOREADY) IVPB 1250 mg/250 mL, 1,250 mg, Intravenous, Q12H, Mickeal Skinner A, RPH  sodium chloride 50 mL/hr at 06/13/22 1129   cefTRIAXone (ROCEPHIN)  IV Stopped (06/12/22 1418)   chlorproMAZINE (THORAZINE) 12.5 mg in sodium chloride 0.9 % 25 mL IVPB Stopped (06/11/22 0952)   potassium chloride 10 mEq (06/13/22 1130)   vancomycin      acetaminophen, chlorproMAZINE (THORAZINE) 12.5 mg in sodium chloride 0.9 % 25 mL IVPB, hydrALAZINE, LORazepam **OR** LORazepam, ondansetron (ZOFRAN) IV, oxyCODONE, tiZANidine   Objective   Vitals:   06/13/22 0040 06/13/22 0421 06/13/22 0754 06/13/22 1154  BP: (!) 113/56 132/71 123/60 127/73  Pulse: 99 86 98 88  Resp: 18 18 16 16   Temp: 98.2 F (36.8 C) 97.9 F (36.6 C) 97.7 F (36.5 C) 98.3 F (36.8 C)  TempSrc:   Axillary Oral  SpO2: 97%  94% 92%  Weight:      Height:         Physical Exam Vitals and nursing note reviewed.  Constitutional:      General: He is not in acute distress.    Appearance: He is ill-appearing and toxic-appearing.  He is not diaphoretic.     Comments: Malnourished appearing. Muscle wasting  HENT:     Head: Normocephalic and atraumatic.     Nose: Nose normal.     Mouth/Throat:      Mouth: Mucous membranes are dry.     Pharynx: Oropharynx is clear.  Eyes:     General: Scleral icterus present.     Extraocular Movements: Extraocular movements intact.  Cardiovascular:     Rate and Rhythm: Regular rhythm. Tachycardia present.     Heart sounds: Normal heart sounds. No murmur heard.    No friction rub. No gallop.  Pulmonary:     Effort: Pulmonary effort is normal. No respiratory distress.     Breath sounds: Normal breath sounds. No wheezing, rhonchi or rales.  Abdominal:     General: Bowel sounds are normal. There is no distension.     Palpations: Abdomen is soft.     Tenderness: There is no abdominal tenderness. There is no guarding or rebound.  Musculoskeletal:     Cervical back: Neck supple.     Right lower leg: No edema.     Left lower leg: No edema.  Skin:    General: Skin is warm and dry.     Coloration: Skin is jaundiced. Skin is not pale.  Neurological:     Comments: Encephalopathic. Opens eyes to stimulation     Laboratory Data Recent Labs  Lab 06/11/22 0718 06/12/22 0627 06/13/22 0504  WBC 6.7 7.2 6.4  HGB 15.0 13.3 13.8  HCT 42.5 37.6* 39.4  PLT 40* 38* 48*   Recent Labs  Lab 06/11/22 0718 06/12/22 0627 06/13/22 0504  NA 133* 133* 137  K 3.5 3.6 3.2*  CL 94* 101 103  CO2 25 23 23   BUN 21* 28* 38*  CALCIUM 9.5 8.9 8.7*  PROT 8.1 7.0 6.8  BILITOT 14.3* 12.3* 12.6*  ALKPHOS 140* 107 100  ALT 38 35 34  AST 92* 80* 85*  GLUCOSE 278* 229* 207*   Recent Labs  Lab 06/07/2022 1132  INR 1.3*    No results for input(s): "LIPASE" in the last 72 hours.      Imaging Studies: No results found.  Assessment:   # acute encephalopathy - likely multifactorial- unclear that this is solely hepatic encephalopathy  - patient not receiving enough lactulose to know if this is reversing hepatic encephalopathy. Ammonia level is not predictive of severity of encephalopathy  # HCC s/p TACE (last dose appears to be 01/2022)  - being  evaluated via CSF/LP - given his [facility], consideration for metastases. May need contrasted brain study as he had non con upon arrival to hospital  # Cirrhosis 2/2 etoh and hcv MELD 3.0 (23); Child Pugh C  # Portal htn- hyepr bili, coagulopathy, thrombocytopenia, hypo na # HFpEF # h/o cva  Plan:  Hyperbilirubinemia does not appear to be in obstructive pattern Can be related to sepsis induced cholestasis, further liver decompensation (although AST/ALT relatively stable/unremarkable), etoh use - check salicylate and tylenol levels ( although pt has been hospitalized for 3 days so this may have washed out) Consideration for withdrawals vs wernicke- pt already on thiamine Electrolyte correction as per primary team Consider further contrasted brain imaging give his [facility] for mets Consider CT Liver triple phase to assess for further development of HCC/progression- if patient able to go through this Avoid hepatotoxic and sedating agents No signs of GIB  Aggressively treat with lactulose- tid via ngt; enemas tid if not able  to receive orally Start xifaxin 550mg  bid Continue thiamine  CIWA protocol  I personally performed the service.  Management of other medical comorbidities as per primary team  Thank you for allowing [place] to participate in this patient's care. Please don't hesitate to call if any questions or concerns arise.   Annamaria Helling, DO [facility]  Portions of the record may have been created with voice recognition software. Occasional wrong-word or 'sound-a-like' substitutions may have occurred due to the inherent limitations of voice recognition software.  Read the chart carefully and recognize, using context, where substitutions may have occurred.

## 2022-06-13 NOTE — Progress Notes (Addendum)
Initial Nutrition Assessment  DOCUMENTATION CODES:   Non-severe (moderate) malnutrition in context of chronic illness  INTERVENTION:   -Once NGT is placed and placement is verified:   Initiate Glucerna 1.5 @ 20 ml/hr and increase by 10 ml every 12 hours to goal rate of 70 ml/hr.   30 ml free water flush every 4 hours  Tube feeding regimen provides 2520 kcal (100% of needs), 139 grams of protein, and 1275 ml of H2O. Total free water: 1455 ml daily  -Monitor Mg, K, and Phos daily and replete as needed due to high refeeding risk -100 mg thiamine IV daily (ordered by MD)  NUTRITION DIAGNOSIS:   Moderate Malnutrition related to chronic illness (cirrhosis) as evidenced by mild fat depletion, mild muscle depletion, moderate muscle depletion.  GOAL:   Patient will meet greater than or equal to 90% of their needs  MONITOR:   PO intake, Supplement acceptance, TF tolerance  REASON FOR ASSESSMENT:   Consult Enteral/tube feeding initiation and management  ASSESSMENT:   Pt with medical history significant of hypertension, hyperlipidemia, diabetes mellitus, diastolic CHF, stroke, GERD, BPH, polysubstance abuse (alcohol, tobacco and cocaine), hepatocellular carcinoma (s/p of embolization treatment in [facility] per his brother), liver cirrhosis, who presents with altered mental status, fall and back pain.  Pt admitted with hepatic encephalopathy and cirrhosis.   Reviewed I/O's: +148 ml x 24 hours and -877 ml since admission  UOP: 450 ml x 24 hours  Per H&P, pt continues to drink daily.    Per radiology notes, ultrasound revealed no ascites and no plan for paracentesis.   Pt lying in bed at time of visit. Pt did not arouse to voice or touch. No family present to provide additional history.   Per MD, pt is encephalopathic and no stable for discharge. Plan for NGT placement today. No feeding access done at time of visit.   Lumbar puncture pending today.   Reviewed wt hx; wt has been  stable over the past 6 months.   Medications reviewed and include folic acid, lactulose, ativan, and thiamine.  Lab Results  Component Value Date   HGBA1C 8.2 (H) 03/12/2022   PTA DM medications are 1.5 mg duaglutide and 1000 mg metformin daily.   Labs reviewed: K: 3.2, CBGS: 165-206 (inpatient orders for glycemic control are 0-5 units insulin aspart daily, 0-9 units insulin aspart TID with meals, and 18 units insulin glargine-yfgn daily).    NUTRITION - FOCUSED PHYSICAL EXAM:  Flowsheet Row Most Recent Value  Orbital Region Mild depletion  Upper Arm Region Mild depletion  Thoracic and Lumbar Region No depletion  Buccal Region Moderate depletion  Temple Region Mild depletion  Clavicle Bone Region Moderate depletion  Clavicle and Acromion Bone Region Mild depletion  Scapular Bone Region No depletion  Dorsal Hand Moderate depletion  Patellar Region Moderate depletion  Anterior Thigh Region Moderate depletion  Posterior Calf Region Moderate depletion  Edema (RD Assessment) None  Hair Reviewed  Eyes Reviewed  Mouth Reviewed  Skin Reviewed  Nails Reviewed       Diet Order:   Diet Order             Diet heart healthy/carb modified Room service appropriate? Yes; Fluid consistency: Thin  Diet effective now                   EDUCATION NEEDS:   Not appropriate for education at this time  Skin:  Skin Assessment: Reviewed RN Assessment  Last BM:  06/13/22 (type 6)  Height:   Ht Readings from Last 1 Encounters:  06/04/2022 6\' 2"  (1.88 m)    Weight:   Wt Readings from Last 1 Encounters:  06/21/2022 86.5 kg    Ideal Body Weight:  86.4 kg  BMI:  Body mass index is 24.48 kg/m.  Estimated Nutritional Needs:   Kcal:  2500-2700  Protein:  130-145 grams  Fluid:  > 2 L    Loistine Chance, RD, LDN, [place] Registered Dietitian II Certified Diabetes Care and Education Specialist Please refer to [facility] for RD and/or RD on-call/weekend/after hours pager

## 2022-06-13 NOTE — Procedures (Signed)
Patient presented to [facility] ultrasound for possible paracentesis. Limited ultrasound evaluation of the abdomen revealed no ascites. No procedure performed. Images saved in Epic. Dr. Serafina Royals made aware. Ordering provider made aware via Epic chat.  Soyla Dryer, [place] [phone] 06/13/2022, 10:03 AM

## 2022-06-13 NOTE — Consult Note (Signed)
Pharmacy Antibiotic Note  Johnny Kelley is a 57 y.o. male admitted on 06/22/2022 with confusion. PMH significant for HTN, HLD, DM, dCHF, stroke, GERD, BPH, polysubstance abuse (alcohol, tobacco and cocaine), hepatocellular carcinoma (s/p of embolization treatment in [facility] per his brother), liver cirrhosis. On 3/15 a herpetic lesion was noticed on his upper lip, and LP with CSF studies was ordered. ME panel positive for Cryptococcus neoformans/gatti. Pharmacy has been consulted for amphotericin B dosing.  Plan: Initiate Liposomal Amphotericin-B (3 mg/kg/day TBW) Liposomal Amphotericin B 260 mg IV daily  Give AmphoB infusion after initial 500 mL NS bolus followed by D5 flush After completion of AmphoB infusion, give D5 flush then 500 mL NS bolus AmphoB not compatible with NS, requires D5 flush in between AmphoB and NS administration  NS bolus required for reduction in nephrotoxicity risk Supportive medications PRN: demerol (rigors), diphenhydramine and acetaminophen (infusion reaction) Monitor BMP + Mg daily Continue to monitor renal function closely, replace Mg and K as needed, and follow culture results   Height: 6\' 2"  (188 cm) Weight: 86.5 kg (190 lb 11.2 oz) IBW/kg (Calculated) : 82.2  Temp (24hrs), Avg:98 F (36.7 C), Min:97.6 F (36.4 C), Max:98.3 F (36.8 C)  Recent Labs  Lab 05/31/2022 1132 06/11/22 0718 06/11/22 1333 06/11/22 1623 06/12/22 0627 06/13/22 0504  WBC 6.1 6.7  --   --  7.2 6.4  CREATININE 0.61 0.90  --   --  0.93 1.08  LATICACIDVEN  --   --  3.0* 2.7*  --   --     Estimated Creatinine Clearance: 86.7 mL/min (by C-G formula based on SCr of 1.08 mg/dL).    No Known Allergies  Antimicrobials this admission: 3/13 Vancomycin >>  3/13 Ceftriaxone >>  3/14 Rifaximin >> 3/15 Acyclovir >> 3/15 Amphotericin B >>  Dose adjustments this admission: 3/15 Vancomycin 1500 mg IV Q12H >> Vancomycin 1250 mg IV Q12H   Microbiology results: 3/13 BCx: NG2D 3/12 MRSA  PCR: positive 3/15 CSF Cx: NG<12H 3/15 CSF VRDL: IP 3/15 ME Panel: Cryptococcus neoformans/gattii detected  Thank you for allowing pharmacy to be a part of this patient's care.  Gretel Acre, PharmD PGY1 Pharmacy Resident 06/13/2022 8:31 PM

## 2022-06-13 NOTE — Progress Notes (Signed)
PT Cancellation Note  Patient Details Name: Johnny Kelley MRN: [id number] DOB: September 26, 1965   Cancelled Treatment:    Reason Eval/Treat Not Completed: Medical issues which prohibited therapy. Patient is unarousable this am, will re-attempt as time allows.    [name] 06/13/2022, 11:41 AM

## 2022-06-13 NOTE — Progress Notes (Signed)
OT Cancellation Note  Patient Details Name: Johnny Kelley MRN: [id number] DOB: 13-May-1965   Cancelled Treatment:    Reason Eval/Treat Not Completed: Medical issues which prohibited therapy (pt is not responsive at this time; is snoring and unarousable.)  Vania Rea 06/13/2022, 11:12 AM

## 2022-06-13 NOTE — Progress Notes (Signed)
[facility] Progress Note  Patient: Johnny Kelley    [id number]  DOA: 06/03/2022     Date of Service: the patient was seen and examined on 06/13/2022  Chief Complaint  Patient presents with   Altered Mental Status   Brief hospital course: Johnny Kelley is a 57 y.o. male with medical history significant of hypertension, hyperlipidemia, diabetes mellitus, diastolic CHF, stroke, GERD, BPH, polysubstance abuse (alcohol, tobacco and cocaine), hepatocellular carcinoma (s/p of embolization treatment in [facility] per his brother), liver cirrhosis, who presents with altered mental status, fall and back pain. Per his brother (I called his brother by phone), patient has chronic back pain with abnormal MRI findings. Pt has an appointment with neurosurgeon, Dr. Cari Caraway on 3/14.  Due to back pain, patient has difficulty walking and decreased mobility.  It is very difficult for his brother to take care of him at home. Patient has been confused in the past several days. In the ED  patient was very lethargic, mildly confused, but still orientated x 3.  He did complain of mid and lower back pain, which is constant, severe, nonradiating.  He also has bilateral leg weakness.    Assessment and Plan:  Hepatic encephalopathy ([place]): Patient's altered mental status is possibly due to hepatic encephalopathy.   Ammonia level 36-18.  CT head negative.  UDS negative. -Frequent neurocheck -Fall precaution -Lactulose 20 g 3 times daily vs lactulose enema daily if patient is unable to take oral lactulose -Repeat ammonia level  -consult TOC Fever could be unknown cause versus SBP Baric antibiotics ceftriaxone 2 g IV daily and vancomycin, pharmacy consulted for dosing and trough monitoring Start rifaximin 550 mg p.o. twice daily when able to take oral medications 3/15 herpetic lesion noticed on upper lip, may be viral encephalitis causing AMS Lumbar puncture ordered Pharmacy was consulted for acyclovir  dosing [place] abd was done for paracentesis but there was no fluid to Tap, so could not r/o SBP Follow CSF fluid studies 3/15 NG tube insertion was ordered for feeding, dietitian consulted.   Cirrhosis ([place]):  INR 1.3, PTT 28.  AST 98, ALT 40, total bilirubin 11.7.  Patient continues to drink alcohol. -Cautiously use Tylenol -Did counseling about importance of quitting alcohol drinking Elevated bilirubin   Fall at home, initial encounter: CT head and neck negative. -Fall precaution -PT/OT   Hyperkalemia: Potassium 5.3 S/p Leukoma 5 g and s/p 40 mg Lasix due to elevated BNP K 3.5   Hypokalemia, potassium 3.2 on 3/15 Potassium was repleted. Monitor electrolytes and replete as needed.   HTN (hypertension) D/c'd Amlodipine, HCTZ, metoprolol, due to hypotension -Hold losartan due to hyperkalemia Midodrine was ordered but patient could not take oral medication so it was not given, so discontinued S/p NS 500 mL bolus given 3/15 SVT 6 followed by 16 beats, patient was unable to offer any complaint due to encephalopathy.  Lopressor 5 mg IV 1 dose given. Amg 1.9 wnl We will continue to monitor BP and titrate medications accordingly   History of CVA (cerebrovascular accident) -Patient is not taking Plavix due to easy bleeding   Chronic diastolic CHF (congestive heart failure) ([place]): 2D echo on 06/24/2021 showed EF of 60 to 65% with grade 1 diastolic dysfunction.  BNP is elevated 418 and chest x-ray with vascular crowding, but patient only has trace leg edema, no JVT, no shortness of breath or oxygen desaturation.  Does not seem to have CHF exacerbation.  Patient is at risk of developing CHF exacerbation -s/p  1 dose of Lasix to 40 mg x 1  -Held HCTZ due to low blood pressure   Type 2 diabetes mellitus with diabetic neuropathy, without long-term current use of insulin ([place]): Recent A1c 5.7, well-controlled.  Patient taking Trulicity, metformin and Jardiance. -Sliding scale insulin 3/14  started Semglee 18 units daily  BPH with obstruction/lower urinary tract symptoms - Flomax   Thrombocytopenia ([place]): this is a chronic issue due to liver cirrhosis. Plts 38--48 -Follow-up CBC   Alcohol abuse and Tobacco abuse -Did counseling about importance of quitting alcohol use and tobacco use -CIWA protocol -Nicotine patch   Back pain: Patient has chronic mid and lower back pain, with abnormal MRI findings as above.  Patient has an appointment with Dr. Cari Caraway of neurosurgery on 3/14 -Consulted Dr. Cari Caraway for neurosurgery, recommended no intervention until his mental status improves.   Body mass index is 24.48 kg/m.  Interventions:    Diet: Heart healthy and carb modified diet DVT Prophylaxis: SCD, pharmacological prophylaxis contraindicated due to thrombocytopenia and bleeding    Advance goals of care discussion: DNR  Family Communication: family was not present at bedside, at the time of interview.  The pt provided permission to discuss medical plan with the family. Opportunity was given to ask question and all questions were answered satisfactorily.  3/13 d/w patient's brother over the phone  Disposition:  Pt is from Home, admitted with AMS, still has encephalopathy, which precludes a safe discharge. Discharge to SNF, when clinically stable.  Subjective: No significant overnight events, patient remained encephalopathic, unable to offer any complaints, resting comfortably, moving extremities spontaneously.  Patient was noticed to have herpetic lesion on the upper lip.  Patient has excessive warts in the genital area.  Physical Exam: General: NAD, lying comfortably Appear in no distress Eyes: PERRLA ENT: Oral Mucosa Clear, dry, herpetic lesion right upper lip Neck: no JVD,  Cardiovascular: S1 and S2 Present, no Murmur,  Respiratory: good respiratory effort, Bilateral Air entry equal and Decreased, no Crackles, no wheezes Abdomen: Bowel Sound present, Soft and  no tenderness, genital warts Skin: no rashes Extremities: no Pedal edema, no calf tenderness Neurologic: Encephalopathic, without any new focal findings Gait not checked due to patient safety concerns  Vitals:   06/13/22 0040 06/13/22 0421 06/13/22 0754 06/13/22 1154  BP: (!) 113/56 132/71 123/60 127/73  Pulse: 99 86 98 88  Resp: 18 18 16 16   Temp: 98.2 F (36.8 C) 97.9 F (36.6 C) 97.7 F (36.5 C) 98.3 F (36.8 C)  TempSrc:   Axillary Oral  SpO2: 97%  94% 92%  Weight:      Height:        Intake/Output Summary (Last 24 hours) at 06/13/2022 1358 Last data filed at 06/13/2022 0553 Gross per 24 hour  Intake 497.91 ml  Output 450 ml  Net 47.91 ml   Filed Weights   06/17/2022 1129 06/28/2022 1130 06/03/2022 1354  Weight: 87.5 kg 86.2 kg 86.5 kg    Data Reviewed: I have personally reviewed and interpreted daily labs, tele strips, imagings as discussed above. I reviewed all nursing notes, pharmacy notes, vitals, pertinent old records I have discussed plan of care as described above with RN and patient/family.  CBC: Recent Labs  Lab 06/25/2022 1132 06/11/22 0718 06/12/22 0627 06/13/22 0504  WBC 6.1 6.7 7.2 6.4  NEUTROABS 3.9  --   --   --   HGB 14.4 15.0 13.3 13.8  HCT 40.5 42.5 37.6* 39.4  MCV 102.5* 102.4* 102.7* 103.1*  PLT 36* 40* 38* 48*   Basic Metabolic Panel: Recent Labs  Lab 06/13/2022 1132 06/07/2022 1920 06/11/22 0718 06/11/22 1054 06/12/22 0627 06/13/22 0504  NA 130*  --  133*  --  133* 137  K 5.3* 4.3 3.5  --  3.6 3.2*  CL 95*  --  94*  --  101 103  CO2 22  --  25  --  23 23  GLUCOSE 255*  --  278*  --  229* 207*  BUN 14  --  21*  --  28* 38*  CREATININE 0.61  --  0.90  --  0.93 1.08  CALCIUM 8.4*  --  9.5  --  8.9 8.7*  MG  --   --   --  1.9 1.9 1.9  PHOS  --   --   --  4.2 3.7 3.5    Studies: No results found.  Scheduled Meds:  feeding supplement  237 mL Oral BID BM   folic acid  1 mg Oral Daily   insulin aspart  0-5 Units Subcutaneous QHS    insulin aspart  0-9 Units Subcutaneous TID WC   insulin glargine-yfgn  18 Units Subcutaneous Daily   lactulose  20 g Oral Daily   Or   lactulose  300 mL Rectal Daily   lidocaine  1 patch Transdermal Q24H   LORazepam  0-4 mg Oral Q12H   multivitamin with minerals  1 tablet Oral Daily   mupirocin ointment  1 Application Nasal BID   nicotine  21 mg Transdermal Daily   pantoprazole (PROTONIX) IV  40 mg Intravenous Q24H   rifaximin  550 mg Oral BID   tamsulosin  0.4 mg Oral Daily   thiamine  100 mg Oral Daily   Or   thiamine  100 mg Intravenous Daily   Continuous Infusions:  sodium chloride 50 mL/hr at 06/13/22 1129   cefTRIAXone (ROCEPHIN)  IV Stopped (06/12/22 1418)   chlorproMAZINE (THORAZINE) 12.5 mg in sodium chloride 0.9 % 25 mL IVPB Stopped (06/11/22 0952)   vancomycin     PRN Meds: acetaminophen, chlorproMAZINE (THORAZINE) 12.5 mg in sodium chloride 0.9 % 25 mL IVPB, hydrALAZINE, ondansetron (ZOFRAN) IV, oxyCODONE, tiZANidine  Time spent: 55 minutes  Author: Val Riles. MD [facility] 06/13/2022 1:58 PM  To reach On-call, see care teams to locate the attending and reach out to them via [email or web address]. If 7PM-7AM, please contact night-coverage If you still have difficulty reaching the attending provider, please page the [facility] (Director on Call) for [facility] on amion for assistance.

## 2022-06-13 NOTE — Progress Notes (Signed)
Pharmacy Antibiotic Note  Johnny Kelley is a 57 y.o. male admitted on 06/12/2022 with hepatic encephalopathy and fever of unknown source on ceftriaxone with concern for SBP. Chest x ray vascular crowding. MRSA PCR detected. Pharmacy has been consulted for vancomycin dosing.  Scr increased to 1.08. Day 4 of antibiotics.   Plan: Reduce vancomycin dose Vancomycin IV 1,250 mg every 12 hours maintenance dose Goal AUC 400-600 Estimated AUC 525.8, Cmin 15 Used Scr 1.08, Vd 0.72, IBW  Also on ceftriaxone 2 grams every 24 hours  Height: 6\' 2"  (188 cm) Weight: 86.5 kg (190 lb 11.2 oz) IBW/kg (Calculated) : 82.2  Temp (24hrs), Avg:98 F (36.7 C), Min:97.8 F (36.6 C), Max:98.3 F (36.8 C)  Recent Labs  Lab 06/27/2022 1132 06/11/22 0718 06/11/22 1333 06/11/22 1623 06/12/22 0627 06/13/22 0504  WBC 6.1 6.7  --   --  7.2 6.4  CREATININE 0.61 0.90  --   --  0.93 1.08  LATICACIDVEN  --   --  3.0* 2.7*  --   --      Estimated Creatinine Clearance: 86.7 mL/min (by C-G formula based on SCr of 1.08 mg/dL).    No Known Allergies  Antimicrobials this admission: vancomycin 3/13 >>  ceftriaxone 3/13 >>  Dose adjustments this admission: N/a  Microbiology results: 3/13 BCx: NG x 2 days 3/12 MRSA PCR: detected  Thank you for allowing pharmacy to be a part of this patient's care.  Glean Salvo, PharmD, BCPS Clinical Pharmacist  06/13/2022 7:31 AM

## 2022-06-13 NOTE — Procedures (Signed)
L5/S1 LP yielding 11 ml of yellow/orange CSF for laboratory studies. Please see full dictation under the imaging tab in Epic.   Soyla Dryer, [place] [phone] 06/13/2022, 4:15 PM

## 2022-06-13 NOTE — Progress Notes (Signed)
Dr Dwyane Dee and RN Velna Hatchet made aware that tele reports 6 beat run of vtach and shortly after a 16 beat run of vtach

## 2022-06-13 NOTE — Progress Notes (Addendum)
Pharmacy Antibiotic Note  Johnny Kelley is a 57 y.o. male admitted on 06/27/2022 with fever unknown source and concern for HSV encephalitis. Pharmacy has been consulted for acyclovir dosing.  Plan: Acyclovir IV 850 mg (~10mg /kg) every 8 hours Normal saline @75mL /hr  Height: 6\' 2"  (188 cm) Weight: 86.5 kg (190 lb 11.2 oz) IBW/kg (Calculated) : 82.2  Temp (24hrs), Avg:98.1 F (36.7 C), Min:97.7 F (36.5 C), Max:98.3 F (36.8 C)  Recent Labs  Lab 06/15/2022 1132 06/11/22 0718 06/11/22 1333 06/11/22 1623 06/12/22 0627 06/13/22 0504  WBC 6.1 6.7  --   --  7.2 6.4  CREATININE 0.61 0.90  --   --  0.93 1.08  LATICACIDVEN  --   --  3.0* 2.7*  --   --     Estimated Creatinine Clearance: 86.7 mL/min (by C-G formula based on SCr of 1.08 mg/dL).    No Known Allergies  Antimicrobials this admission: vancomycin 3/13 >>  ceftriaxone 3/13 >>  Acyclovir 3/15 >>  Dose adjustments this admission: N/a  Microbiology results: 3/13 BCx: no growth x 2 days 3/15 lumbar puncture: to be collected   Thank you for allowing pharmacy to be a part of this patient's care.  Wynelle Cleveland 06/13/2022 1:34 PM

## 2022-06-14 ENCOUNTER — Inpatient Hospital Stay: Payer: Medicaid Other

## 2022-06-14 DIAGNOSIS — B451 Cerebral cryptococcosis: Secondary | ICD-10-CM | POA: Diagnosis not present

## 2022-06-14 LAB — BASIC METABOLIC PANEL
Anion gap: 7 (ref 5–15)
BUN: 38 mg/dL — ABNORMAL HIGH (ref 6–20)
CO2: 21 mmol/L — ABNORMAL LOW (ref 22–32)
Calcium: 8.3 mg/dL — ABNORMAL LOW (ref 8.9–10.3)
Chloride: 105 mmol/L (ref 98–111)
Creatinine, Ser: 0.73 mg/dL (ref 0.61–1.24)
GFR, Estimated: >60 mL/min (ref >60)
Glucose, Bld: 241 mg/dL — ABNORMAL HIGH (ref 70–99)
Potassium: 3.7 mmol/L (ref 3.5–5.1)
Sodium: 133 mmol/L — ABNORMAL LOW (ref 135–145)

## 2022-06-14 LAB — BLOOD CULTURE ID PANEL (REFLEXED) - BCID2
A.calcoaceticus-baumannii: NOT DETECTED
Bacteroides fragilis: NOT DETECTED
Candida albicans: NOT DETECTED
Candida auris: NOT DETECTED
Candida glabrata: NOT DETECTED
Candida krusei: NOT DETECTED
Candida parapsilosis: NOT DETECTED
Candida tropicalis: NOT DETECTED
Cryptococcus neoformans/gattii: DETECTED — AB
Enterobacter cloacae complex: NOT DETECTED
Enterobacterales: NOT DETECTED
Enterococcus Faecium: NOT DETECTED
Enterococcus faecalis: NOT DETECTED
Escherichia coli: NOT DETECTED
Haemophilus influenzae: NOT DETECTED
Klebsiella aerogenes: NOT DETECTED
Klebsiella oxytoca: NOT DETECTED
Klebsiella pneumoniae: NOT DETECTED
Listeria monocytogenes: NOT DETECTED
Neisseria meningitidis: NOT DETECTED
Proteus species: NOT DETECTED
Pseudomonas aeruginosa: NOT DETECTED
Salmonella species: NOT DETECTED
Serratia marcescens: NOT DETECTED
Staphylococcus aureus (BCID): NOT DETECTED
Staphylococcus epidermidis: NOT DETECTED
Staphylococcus lugdunensis: NOT DETECTED
Staphylococcus species: NOT DETECTED
Stenotrophomonas maltophilia: NOT DETECTED
Streptococcus agalactiae: NOT DETECTED
Streptococcus pneumoniae: NOT DETECTED
Streptococcus pyogenes: NOT DETECTED
Streptococcus species: NOT DETECTED

## 2022-06-14 LAB — GLUCOSE, CAPILLARY
Glucose-Capillary: 153 mg/dL — ABNORMAL HIGH (ref 70–99)
Glucose-Capillary: 158 mg/dL — ABNORMAL HIGH (ref 70–99)
Glucose-Capillary: 159 mg/dL — ABNORMAL HIGH (ref 70–99)
Glucose-Capillary: 162 mg/dL — ABNORMAL HIGH (ref 70–99)
Glucose-Capillary: 180 mg/dL — ABNORMAL HIGH (ref 70–99)
Glucose-Capillary: 255 mg/dL — ABNORMAL HIGH (ref 70–99)

## 2022-06-14 LAB — PHOSPHORUS: Phosphorus: 2.2 mg/dL — ABNORMAL LOW (ref 2.5–4.6)

## 2022-06-14 LAB — CBC
HCT: 35.6 % — ABNORMAL LOW (ref 39.0–52.0)
Hemoglobin: 12.4 g/dL — ABNORMAL LOW (ref 13.0–17.0)
MCH: 36.5 pg — ABNORMAL HIGH (ref 26.0–34.0)
MCHC: 34.8 g/dL (ref 30.0–36.0)
MCV: 104.7 fL — ABNORMAL HIGH (ref 80.0–100.0)
Platelets: 46 10*3/uL — ABNORMAL LOW (ref 150–400)
RBC: 3.4 MIL/uL — ABNORMAL LOW (ref 4.22–5.81)
RDW: 17.7 % — ABNORMAL HIGH (ref 11.5–15.5)
WBC: 7.9 10*3/uL (ref 4.0–10.5)
nRBC: 0 % (ref 0.0–0.2)

## 2022-06-14 LAB — HEPATIC FUNCTION PANEL
ALT: 39 U/L (ref 0–44)
AST: 98 U/L — ABNORMAL HIGH (ref 15–41)
Albumin: 1.9 g/dL — ABNORMAL LOW (ref 3.5–5.0)
Alkaline Phosphatase: 94 U/L (ref 38–126)
Bilirubin, Direct: 7.1 mg/dL — ABNORMAL HIGH (ref 0.0–0.2)
Indirect Bilirubin: 6.7 mg/dL — ABNORMAL HIGH (ref 0.3–0.9)
Total Bilirubin: 13.8 mg/dL — ABNORMAL HIGH (ref 0.3–1.2)
Total Protein: 6.3 g/dL — ABNORMAL LOW (ref 6.5–8.1)

## 2022-06-14 LAB — CRYPTOCOCCAL ANTIGEN, CSF
Crypto Ag: POSITIVE — AB
Cryptococcal Ag Titer: 2560 — AB

## 2022-06-14 LAB — HEPATITIS PANEL, ACUTE
HCV Ab: REACTIVE — AB
Hep A IgM: NONREACTIVE
Hep B C IgM: NONREACTIVE
Hepatitis B Surface Ag: NONREACTIVE

## 2022-06-14 LAB — MAGNESIUM: Magnesium: 2.2 mg/dL (ref 1.7–2.4)

## 2022-06-14 MED ORDER — DEXTROSE 5 % IV SOLN
3.0000 mg/kg | Freq: Every day | INTRAVENOUS | Status: DC
  Administered 2022-06-14 – 2022-06-17 (×5): 260 mg via INTRAVENOUS
  Filled 2022-06-14: qty 65
  Filled 2022-06-14: qty 12.5
  Filled 2022-06-14 (×5): qty 65

## 2022-06-14 MED ORDER — GLUCERNA 1.5 CAL PO LIQD
1000.0000 mL | ORAL | Status: DC
  Administered 2022-06-14 – 2022-06-16 (×2): 1000 mL

## 2022-06-14 MED ORDER — FLUCYTOSINE 500 MG PO CAPS
25.0000 mg/kg | ORAL_CAPSULE | Freq: Four times a day (QID) | ORAL | Status: DC
  Administered 2022-06-14 – 2022-06-17 (×10): 2250 mg
  Filled 2022-06-14 (×13): qty 1

## 2022-06-14 MED ORDER — FLUCYTOSINE 500 MG PO CAPS
25.0000 mg/kg | ORAL_CAPSULE | Freq: Four times a day (QID) | ORAL | Status: DC
  Filled 2022-06-14 (×2): qty 1

## 2022-06-14 MED ORDER — FLUCYTOSINE 250 MG PO CAPS
25.0000 mg/kg | ORAL_CAPSULE | Freq: Four times a day (QID) | ORAL | Status: DC
  Filled 2022-06-14: qty 9

## 2022-06-14 MED ORDER — MIDODRINE HCL 5 MG PO TABS
10.0000 mg | ORAL_TABLET | Freq: Three times a day (TID) | ORAL | Status: DC
  Administered 2022-06-14 – 2022-06-17 (×7): 10 mg via NASOGASTRIC
  Filled 2022-06-14 (×7): qty 2

## 2022-06-14 MED ORDER — METOPROLOL TARTRATE 5 MG/5ML IV SOLN
5.0000 mg | Freq: Three times a day (TID) | INTRAVENOUS | Status: DC
  Administered 2022-06-14 – 2022-06-17 (×9): 5 mg via INTRAVENOUS
  Filled 2022-06-14 (×9): qty 5

## 2022-06-14 MED ORDER — POTASSIUM CHLORIDE 10 MEQ/100ML IV SOLN
10.0000 meq | INTRAVENOUS | Status: AC
  Administered 2022-06-14 (×2): 10 meq via INTRAVENOUS
  Filled 2022-06-14 (×2): qty 100

## 2022-06-14 MED ORDER — CHLORHEXIDINE GLUCONATE CLOTH 2 % EX PADS
6.0000 | MEDICATED_PAD | Freq: Every day | CUTANEOUS | Status: DC
  Administered 2022-06-15 – 2022-06-18 (×4): 6 via TOPICAL

## 2022-06-14 MED ORDER — POTASSIUM PHOSPHATES 15 MMOLE/5ML IV SOLN
30.0000 mmol | Freq: Once | INTRAVENOUS | Status: AC
  Administered 2022-06-14: 30 mmol via INTRAVENOUS
  Filled 2022-06-14: qty 10

## 2022-06-14 MED ORDER — INSULIN GLARGINE-YFGN 100 UNIT/ML ~~LOC~~ SOLN
22.0000 [IU] | Freq: Every day | SUBCUTANEOUS | Status: DC
  Administered 2022-06-14 – 2022-06-18 (×5): 22 [IU] via SUBCUTANEOUS
  Filled 2022-06-14 (×6): qty 0.22

## 2022-06-14 NOTE — Consult Note (Signed)
PHARMACY - PHYSICIAN COMMUNICATION CRITICAL VALUE ALERT - BLOOD CULTURE IDENTIFICATION (BCID)  Johnny Kelley is an 57 y.o. male who presented to [facility] on 06/05/2022 with confusion. PMH significant for HTN, HLD, DM, dCHF, stroke, GERD, BPH, polysubstance abuse (alcohol, tobacco and cocaine), hepatocellular carcinoma (s/p of embolization treatment in [facility] per his brother), liver cirrhosis. On 3/15 a herpetic lesion was noticed on his upper lip, and LP with CSF studies was ordered. ME panel positive for Cryptococcus neoformans/gatti.   Assessment: 1 out of 4 bottles growing yeast. BCID detects Cryptococcus neoformans/gatti.  Name of physician (or Provider) Contacted: Val Riles, MD  Current antibiotics: vancomycin, ceftriaxone, rifaximin, acyclovir, amphotericin B, flucytosine  Changes to prescribed antibiotics recommended:  Patient is on recommended antibiotics - No changes needed  Results for orders placed or performed during the hospital encounter of 06/14/2022  Blood Culture ID Panel (Reflexed) (Collected: 06/11/2022 10:54 AM)  Result Value Ref Range   Enterococcus faecalis NOT DETECTED NOT DETECTED   Enterococcus Faecium NOT DETECTED NOT DETECTED   Listeria monocytogenes NOT DETECTED NOT DETECTED   Staphylococcus species NOT DETECTED NOT DETECTED   Staphylococcus aureus (BCID) NOT DETECTED NOT DETECTED   Staphylococcus epidermidis NOT DETECTED NOT DETECTED   Staphylococcus lugdunensis NOT DETECTED NOT DETECTED   Streptococcus species NOT DETECTED NOT DETECTED   Streptococcus agalactiae NOT DETECTED NOT DETECTED   Streptococcus pneumoniae NOT DETECTED NOT DETECTED   Streptococcus pyogenes NOT DETECTED NOT DETECTED   A.calcoaceticus-baumannii NOT DETECTED NOT DETECTED   Bacteroides fragilis NOT DETECTED NOT DETECTED   Enterobacterales NOT DETECTED NOT DETECTED   Enterobacter cloacae complex NOT DETECTED NOT DETECTED   Escherichia coli NOT DETECTED NOT DETECTED   Klebsiella  aerogenes NOT DETECTED NOT DETECTED   Klebsiella oxytoca NOT DETECTED NOT DETECTED   Klebsiella pneumoniae NOT DETECTED NOT DETECTED   Proteus species NOT DETECTED NOT DETECTED   Salmonella species NOT DETECTED NOT DETECTED   Serratia marcescens NOT DETECTED NOT DETECTED   Haemophilus influenzae NOT DETECTED NOT DETECTED   Neisseria meningitidis NOT DETECTED NOT DETECTED   Pseudomonas aeruginosa NOT DETECTED NOT DETECTED   Stenotrophomonas maltophilia NOT DETECTED NOT DETECTED   Candida albicans NOT DETECTED NOT DETECTED   Candida auris NOT DETECTED NOT DETECTED   Candida glabrata NOT DETECTED NOT DETECTED   Candida krusei NOT DETECTED NOT DETECTED   Candida parapsilosis NOT DETECTED NOT DETECTED   Candida tropicalis NOT DETECTED NOT DETECTED   Cryptococcus neoformans/gattii DETECTED (A) NOT DETECTED   Gretel Acre, PharmD PGY1 Pharmacy Resident 06/14/2022 8:05 AM

## 2022-06-14 NOTE — Consult Note (Signed)
    [place] for Infectious Disease    Date of Admission:  06/01/2022      ASSESSMENT:    57 y.o. male admitted with:  Cryptococcal meningitis and fungemia History of hepatitis C History of HCC and cirrhosis  RECOMMENDATIONS:    Continue liposomal amphotericin B and flucytosine Monitor electrolytes closely and replace as needed per protocol Follow cultures Repeat blood cultures tomorrow morning Check HCV RNA Will try to add on cryptococcal antigen to yesterday's CSF for quantification of CrAg titer Will obtain serum CrAg as well in case we're unable to obtain from CSF Monitor for improvement.  He may require further therapeutic lumbar puncture to help control intracranial pressure.  Would also have low threshold for MRI brain if he is able to tolerate this to ensure there is no evidence of cerebral cryptoccoma contributing to his encephalopathy Dr Delaine Lame is here Monday and full consult will follow     HPI:    Remote chart review consult.   Johnny Kelley is a 57 y.o. male who was admitted at [facility] on 06/07/2022 after presenting with encephalopathy in the setting of a fall and back pain.  It was initially felt that his encephalopathy might be due to hepatic encephalopathy in the setting of his known cirrhosis, alcohol use, hepatocellular carcinoma, and hepatitis C.  His LFTs were abnormal with hyperbilirubinemia.  He underwent an ultrasound to assess for SBP, however, there was no ascites to warrant paracentesis.  He was also noted to be febrile on admission to 100.4 F.  Ultimately, he underwent lumbar puncture yesterday yielding 11 mL of CSF fluid.  Opening pressure was unable to be obtained due to the patient's restlessness.  His LP was abnormal with lymphocyte predominant pleocytosis.  Protein and glucose were both elevated.  Meningitis/encephalitis panel was positive for cryptococcus neoformans.  CSF cultures showed encapsulated yeast on Gram stain.  Additionally,  patient's blood cultures from 06/11/2022 are growing budding yeast in both sets with [facility] ID identifying cryptococcus as well.  Patient has been started on liposomal amphotericin B and flucytosine per pharmacy.  Other broad-spectrum antibiotics have been discontinued at this time.  He does not have any obvious immunocompromising conditions other than his cirrhosis and HCC history.  HIV testing has been negative.  Interestingly, patient was evaluated in early January by his primary care provider with persistent headaches for approximately 1 month.  He was started on low-dose amlodipine at that time to see if this would help.  He was seen again in early February and reported that his headaches were improved.  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      Rayn[facility] for Infectious [facility] [phone] pager 06/14/2022, 10:02 AM

## 2022-06-14 NOTE — Consult Note (Signed)
Pharmacy Antibiotic Note  Johnny Kelley is a 57 y.o. male admitted on 06/13/2022 with confusion. PMH significant for HTN, HLD, DM, dCHF, stroke, GERD, BPH, polysubstance abuse (alcohol, tobacco and cocaine), hepatocellular carcinoma (s/p of embolization treatment in [facility] per his brother), liver cirrhosis. On 3/15 a herpetic lesion was noticed on his upper lip, and LP with CSF studies was ordered. ME panel positive for Cryptococcus neoformans/gatti. Pharmacy has been consulted for amphotericin B dosing.  Plan: Continue Liposomal Amphotericin-B (3 mg/kg/day TBW) and add Flucytosine (25 mg/kg Q6H)  Liposomal Amphotericin B 260 mg IV daily  Give initial 500 mL NS bolus followed by D5 flush then administer AmphoB infusion After completion of AmphoB infusion, give D5 flush then 500 mL NS bolus AmphoB not compatible with NS, requires D5 flush in between AmphoB and NS administration  NS bolus required for reduction in nephrotoxicity risk Supportive medications PRN: demerol (rigors), diphenhydramine and acetaminophen (infusion reaction) Flucytosine 2250 mg per tube daily Discontinue acyclovir, vancomycin, and ceftriaxone  Monitor BMP + Mg daily Continue to monitor renal function closely, replace Mg and K as needed, and follow culture results   Height: 6\' 2"  (188 cm) Weight: 86.4 kg (190 lb 7.6 oz) IBW/kg (Calculated) : 82.2  Temp (24hrs), Avg:97.9 F (36.6 C), Min:97.6 F (36.4 C), Max:98.3 F (36.8 C)  Recent Labs  Lab 06/07/2022 1132 06/11/22 0718 06/11/22 1333 06/11/22 1623 06/12/22 0627 06/13/22 0504 06/13/22 2100 06/14/22 0447  WBC 6.1 6.7  --   --  7.2 6.4  --  7.9  CREATININE 0.61 0.90  --   --  0.93 1.08 0.92 0.73  LATICACIDVEN  --   --  3.0* 2.7*  --   --   --   --      Estimated Creatinine Clearance: 117 mL/min (by C-G formula based on SCr of 0.73 mg/dL).    No Known Allergies  Antimicrobials this admission: 3/13 Vancomycin >> 3/15 3/13 Ceftriaxone >> 3/15 3/15  Acyclovir >> 3/15 3/14 Rifaximin >> 3/15 Amphotericin B >> 3/16 Flucytosine >>  Dose adjustments this admission: 3/15 Vancomycin 1500 mg IV Q12H >> Vancomycin 1250 mg IV Q12H   Microbiology results: 3/13 BCx: 1 of 4 growing yeast, BCID Cryptococcus neoformans/gattii 3/12 MRSA PCR: positive 3/15 CSF Cx: encapsulated yeast 3/15 CSF VRDL: IP 3/15 ME Panel: Cryptococcus neoformans/gattii detected  Thank you for allowing pharmacy to be a part of this patient's care.  Gretel Acre, PharmD PGY1 Pharmacy Resident 06/14/2022 8:06 AM

## 2022-06-14 NOTE — Progress Notes (Addendum)
[facility] Progress Note  Patient: Johnny Kelley    [id number]  DOA: 06/19/2022     Date of Service: the patient was seen and examined on 06/14/2022  Chief Complaint  Patient presents with   Altered Mental Status   Brief hospital course: Johnny Kelley is a 57 y.o. male with medical history significant of hypertension, hyperlipidemia, diabetes mellitus, diastolic CHF, stroke, GERD, BPH, polysubstance abuse (alcohol, tobacco and cocaine), hepatocellular carcinoma (s/p of embolization treatment in [facility] per his brother), liver cirrhosis, who presents with altered mental status, fall and back pain. Per his brother (I called his brother by phone), patient has chronic back pain with abnormal MRI findings. Pt has an appointment with neurosurgeon, Dr. Cari Caraway on 3/14.  Due to back pain, patient has difficulty walking and decreased mobility.  It is very difficult for his brother to take care of him at home. Patient has been confused in the past several days. In the ED  patient was very lethargic, mildly confused, but still orientated x 3.  He did complain of mid and lower back pain, which is constant, severe, nonradiating.  He also has bilateral leg weakness.    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  CT head negative.  UDS negative. -Frequent neurocheck -Fall precaution -Lactulose 20 g 3 times daily via NG tube, s/p lactulose enema d/c'd  -consult TOC Patient had fever, empirically patient received ceftriaxone, vancomycin which has been discontinued after diagnosis of cryptococcal meningitis.  DC rifaximin as well. 3/15 herpetic lesion noticed on upper lip, may be viral encephalitis causing AMS Lumbar puncture ordered. S/p acyclovir IV was given which was discontinued after diagnosis of cryptococcal meningitis [place] abd was done for paracentesis but there was  no fluid to Tap, so could not r/o SBP 3/13 blood culture positive for Cryptococcus neoformans/gattii  3/15 CSF shows cryptococcal antigen titer 2560, CSF WBC 148-212, glucose 72, RBC 31, protein 195 3/15 NG tube insertion was ordered for feeding, dietitian consulted. 3/15 started amphotericin B and flucytosine, pharmacy consulted for dosing Follow ID consulted for further recommendation   Cirrhosis [facility]):  INR 1.3, PTT 28.  AST 98, ALT 40, total bilirubin 11.7.  Patient continues to drink alcohol. -Cautiously use Tylenol -Patient will need counseling about importance of quitting alcohol drinking Elevated bilirubin   Fall at home, initial encounter: CT head and neck negative. -Fall precaution -PT/OT   Hyperkalemia: Potassium 5.3 S/p Leukoma 5 g and s/p 40 mg Lasix due to elevated BNP K 3.5   Hypokalemia, potassium 3.2 on 3/15 Potassium was repleted. Hypophosphatemia, Phos repleted. Monitor electrolytes and replete as needed.  HTN (hypertension) D/c'd Amlodipine, HCTZ, metoprolol, due to hypotension -Hold losartan due to hyperkalemia Midodrine was ordered but patient could not take oral medication so it was not given, so discontinued S/p NS 500 mL bolus given 3/15 SVT 6 followed by 16 beats, patient was unable to offer any complaint due to encephalopathy.  Lopressor 5 mg IV 1 dose given. Amg 1.9 wnl We will continue to monitor BP and titrate medications accordingly   History of CVA (cerebrovascular accident) -Patient is not taking Plavix due to easy bleeding   Chronic diastolic CHF (congestive heart failure) ([place]): 2D echo on 06/24/2021 showed EF of 60 to 65% with grade 1 diastolic dysfunction.  BNP is elevated 418 and chest x-ray with vascular crowding, but patient only has trace leg edema, no JVT,  no shortness of breath or oxygen desaturation.  Does not seem to have CHF exacerbation.  Patient is at risk of developing CHF exacerbation -s/p 1 dose of Lasix to 40 mg x 1  -Held  HCTZ due to low blood pressure   Type 2 diabetes mellitus with diabetic neuropathy, without long-term current use of insulin ([name]): Recent A1c 5.7, well-controlled.  Patient taking Trulicity, metformin and Jardiance. -Sliding scale insulin 3/15 increased Semglee 22 units daily  BPH with obstruction/lower urinary tract symptoms - Flomax   Thrombocytopenia ([place]): this is a chronic issue due to liver cirrhosis. Plts 38--48--46 -Follow-up CBC   Alcohol abuse and Tobacco abuse -Did counseling about importance of quitting alcohol use and tobacco use -CIWA protocol -Nicotine patch   Back pain: Patient has chronic mid and lower back pain, with abnormal MRI findings as above.  Patient has an appointment with Dr. Cari Caraway of neurosurgery on 3/14 -Consulted Dr. Cari Caraway for neurosurgery, recommended no intervention until his mental status improves.   Body mass index is 24.48 kg/m.  Interventions:    Diet: NG tube feeding DVT Prophylaxis: SCD, pharmacological prophylaxis contraindicated due to thrombocytopenia and bleeding    Advance goals of care discussion: DNR  Family Communication: family was not present at bedside, at the time of interview.  The pt provided permission to discuss medical plan with the family. Opportunity was given to ask question and all questions were answered satisfactorily.  3/16 d/w patient's brother over the phone  Disposition:  Pt is from Home, admitted with AMS, found to have cryptococcal meningitis, on IV antifungal medication, ID consult pending, which precludes a safe discharge. Discharge to SNF, when clinically stable.  Subjective: No significant overnight events, patient remained encephalopathic, unable to offer any complaints, resting comfortably,    Physical Exam: General: NAD, lying comfortably Appear in no distress Eyes: closed ENT: Oral Mucosa Clear, dry,  Neck: no JVD,  Cardiovascular: S1 and S2 Present, no Murmur,  Respiratory: good  respiratory effort, Bilateral Air entry equal and Decreased, no Crackles, no wheezes Abdomen: Bowel Sound present, Soft and no tenderness, genital warts Skin: no rashes Extremities: no Pedal edema, no calf tenderness Neurologic: Encephalopathic, without any new focal findings Gait not checked due to patient safety concerns  Vitals:   06/14/22 0405 06/14/22 0500 06/14/22 0722 06/14/22 1209  BP: 133/73  125/75 (!) 108/57  Pulse: (!) 102  100 90  Resp: 18  18 17   Temp: 98 F (36.7 C)  97.9 F (36.6 C) 98.5 F (36.9 C)  TempSrc:    Oral  SpO2: 94%  93% 96%  Weight:  86.4 kg    Height:        Intake/Output Summary (Last 24 hours) at 06/14/2022 1336 Last data filed at 06/14/2022 1107 Gross per 24 hour  Intake 3199.66 ml  Output --  Net 3199.66 ml   Filed Weights   06/15/2022 1130 06/25/2022 1354 06/14/22 0500  Weight: 86.2 kg 86.5 kg 86.4 kg    Data Reviewed: I have personally reviewed and interpreted daily labs, tele strips, imagings as discussed above. I reviewed all nursing notes, pharmacy notes, vitals, pertinent old records I have discussed plan of care as described above with RN and patient/family.  CBC: Recent Labs  Lab 06/14/2022 1132 06/11/22 0718 06/12/22 0627 06/13/22 0504 06/14/22 0447  WBC 6.1 6.7 7.2 6.4 7.9  NEUTROABS 3.9  --   --   --   --   HGB 14.4 15.0 13.3 13.8 12.4*  HCT 40.5  42.5 37.6* 39.4 35.6*  MCV 102.5* 102.4* 102.7* 103.1* 104.7*  PLT 36* 40* 38* 48* 46*   Basic Metabolic Panel: Recent Labs  Lab 06/11/22 0718 06/11/22 1054 06/12/22 0627 06/13/22 0504 06/13/22 2100 06/14/22 0447  NA 133*  --  133* 137 137 133*  K 3.5  --  3.6 3.2* 3.2* 3.7  CL 94*  --  101 103 103 105  CO2 25  --  23 23 24  21*  GLUCOSE 278*  --  229* 207* 174* 241*  BUN 21*  --  28* 38* 37* 38*  CREATININE 0.90  --  0.93 1.08 0.92 0.73  CALCIUM 9.5  --  8.9 8.7* 8.7* 8.3*  MG  --  1.9 1.9 1.9 1.9 2.2  PHOS  --  4.2 3.7 3.5  --  2.2*    Studies: DG Abd 1  View  Result Date: 06/14/2022 CLINICAL DATA:  NG tube placement EXAM: ABDOMEN - 1 VIEW COMPARISON:  06/13/2022 FINDINGS: Esophagogastric tube with tip and side port below the diaphragm. Nonobstructive pattern of included bowel gas. No free air. IMPRESSION: Esophagogastric tube with tip and side port below the diaphragm. Electronically Signed   By: Delanna Ahmadi M.D.   On: 06/14/2022 12:24   DG Abd 1 View  Result Date: 06/13/2022 CLINICAL DATA:  Nasogastric tube placement. EXAM: ABDOMEN - 1 VIEW COMPARISON:  CT abdomen pelvis dated 05/18/2020. FINDINGS: The bowel gas pattern is normal. An enteric tube terminates in the stomach with the side port near the gastroesophageal junction. IMPRESSION: An enteric tube terminates in the stomach with the side port near the gastroesophageal junction. Electronically Signed   By: Zerita Boers M.D.   On: 06/13/2022 16:58   DG FL GUIDED LUMBAR PUNCTURE  Result Date: 06/13/2022 CLINICAL DATA:  Patient admitted with hepatic encephalopathy and altered mental status. EXAM: DIAGNOSTIC LUMBAR PUNCTURE UNDER FLUOROSCOPIC GUIDANCE COMPARISON:  None Available. FLUOROSCOPY: Radiation Exposure Index (as provided by the fluoroscopic device): 13.10 mGy [place] PROCEDURE: Informed consent was obtained from the patient's brother prior to the procedure, including potential complications of headache, allergy, and pain. With the patient prone, the lower back was prepped with Betadine. 1% Lidocaine was used for local anesthesia. Lumbar puncture was performed at the L5/S1 level using a 20 gauge needle with return of yellow/orange CSF. Opening pressure unable to be obtained due to patient being restless on the procedure table. 11 ml of CSF were obtained for laboratory studies. The patient tolerated the procedure well and there were no apparent complications. IMPRESSION: Technically successful L5/S1 lumbar puncture yielding 11 mL of yellow/orange CSF for laboratory studies. Opening pressure not  obtained due to patient's restlessness. Performed by: Soyla Dryer, NP Electronically Signed   By: Marin Roberts M.D.   On: 06/13/2022 16:18    Scheduled Meds:  dextrose  10 mL Intravenous Q24H   dextrose  10 mL Intravenous Q24H   flucytosine  25 mg/kg Per Tube 99991111   folic acid  1 mg Oral Daily   free water  30 mL Per Tube Q4H   insulin aspart  0-5 Units Subcutaneous QHS   insulin aspart  0-9 Units Subcutaneous TID WC   insulin glargine-yfgn  22 Units Subcutaneous Daily   lactulose  20 g Per Tube TID   lidocaine  1 patch Transdermal Q24H   multivitamin with minerals  1 tablet Oral Daily   mupirocin ointment  1 Application Nasal BID   nicotine  21 mg Transdermal Daily   pantoprazole (PROTONIX)  IV  40 mg Intravenous Q24H   sodium chloride  500 mL Intravenous Q24H   sodium chloride  500 mL Intravenous Q24H   tamsulosin  0.4 mg Oral Daily   thiamine  100 mg Oral Daily   Or   thiamine  100 mg Intravenous Daily   Continuous Infusions:  amphotericin B liposome (AMBISOME) 260 mg in dextrose 5 % 500 mL IVPB Stopped (06/14/22 0434)   chlorproMAZINE (THORAZINE) 12.5 mg in sodium chloride 0.9 % 25 mL IVPB Stopped (06/11/22 0952)   feeding supplement (GLUCERNA 1.5 CAL)     potassium PHOSPHATE IVPB (in mmol) 30 mmol (06/14/22 1304)   PRN Meds: acetaminophen, acetaminophen, chlorproMAZINE (THORAZINE) 12.5 mg in sodium chloride 0.9 % 25 mL IVPB, diphenhydrAMINE **OR** diphenhydrAMINE, hydrALAZINE, LORazepam, meperidine (DEMEROL) injection, ondansetron (ZOFRAN) IV, oxyCODONE  Time spent: 50 minutes  Author: Val Riles. MD [facility] 06/14/2022 1:36 PM  To reach On-call, see care teams to locate the attending and reach out to them via [email or web address]. If 7PM-7AM, please contact night-coverage If you still have difficulty reaching the attending provider, please page the [facility] (Director on Call) for [facility] on amion for assistance.

## 2022-06-14 NOTE — Progress Notes (Signed)
Nutrition Brief Note  RD received consult for tube feed initiation.   Tube feed protocol has been initiated. Per RN report, NGT has been replaced today and KUB is pending. RD will order tube feeds per RD recommendations from note on 3/15. Will order for feeds to start at trickle rate of 82ml/hr and advance as tolerated as pt is noted to be refeeding. Electrolytes are being monitored and replaced. Pt with hyponatremia today; minimal free water flushes for now to maintain tube patency. IVF discontinued per MD.   Koleen Distance MS, RD, LDN Please refer to [facility] for RD and/or RD on-call/weekend/after hours pager

## 2022-06-14 NOTE — Progress Notes (Signed)
GI Inpatient Follow-up Note  Subjective:  Patient seen in follow-up for hepatic encephalopathy and hyperbilirubinemia. No acute events overnight. No family at bedside. Patient is lethargic today. Per nursing, this morning he was alert and responded yes to a few questions. No signs of GI bleeding. His CSF studies from yesterday were positive for cryptococcus. Pharmacy consulted to start amphotericin-B. Bilirubin stable.   Scheduled Inpatient Medications:   dextrose  10 mL Intravenous Q24H   dextrose  10 mL Intravenous Q24H   feeding supplement  237 mL Oral BID BM   feeding supplement (VITAL HIGH PROTEIN)  1,000 mL Per Tube Q24H   flucytosine  25 mg/kg Per Tube 99991111   folic acid  1 mg Oral Daily   free water  30 mL Per Tube Q4H   insulin aspart  0-5 Units Subcutaneous QHS   insulin aspart  0-9 Units Subcutaneous TID WC   insulin glargine-yfgn  22 Units Subcutaneous Daily   lactulose  20 g Per Tube TID   lidocaine  1 patch Transdermal Q24H   LORazepam  0-4 mg Oral Q12H   multivitamin with minerals  1 tablet Oral Daily   mupirocin ointment  1 Application Nasal BID   nicotine  21 mg Transdermal Daily   pantoprazole (PROTONIX) IV  40 mg Intravenous Q24H   sodium chloride  500 mL Intravenous Q24H   sodium chloride  500 mL Intravenous Q24H   tamsulosin  0.4 mg Oral Daily   thiamine  100 mg Oral Daily   Or   thiamine  100 mg Intravenous Daily    Continuous Inpatient Infusions:    sodium chloride Stopped (06/13/22 1617)   amphotericin B liposome (AMBISOME) 260 mg in dextrose 5 % 500 mL IVPB 260 mg (06/14/22 0234)   chlorproMAZINE (THORAZINE) 12.5 mg in sodium chloride 0.9 % 25 mL IVPB Stopped (06/11/22 0952)   feeding supplement (GLUCERNA 1.5 CAL) Stopped (06/13/22 1649)   potassium PHOSPHATE IVPB (in mmol)      PRN Inpatient Medications:  acetaminophen, acetaminophen, chlorproMAZINE (THORAZINE) 12.5 mg in sodium chloride 0.9 % 25 mL IVPB, diphenhydrAMINE **OR** diphenhydrAMINE,  hydrALAZINE, LORazepam, meperidine (DEMEROL) injection, ondansetron (ZOFRAN) IV, oxyCODONE  Review of Systems:  Unable to obtain 2/2 patient's encephalopathy    Physical Examination: BP 125/75 (BP Location: Right Arm)   Pulse 100   Temp 97.9 F (36.6 C)   Resp 18   Ht 6\' 2"  (1.88 m)   Wt 86.4 kg   SpO2 93%   BMI 24.46 kg/m  Gen: NAD, malnourished appearing muscle wasting HEENT: PEERLA, EOMI, +scleral icterus, opens eyes to stimulation Neck: supple, no JVD or thyromegaly Chest: CTA bilaterally, no wheezes, crackles, or other adventitious sounds CV: RRR, no m/g/c/r Abd: soft, NT, ND, +BS in all four quadrants; no HSM, guarding, ridigity, or rebound tenderness Ext: no edema, well perfused with 2+ pulses, Skin: no rash or lesions noted Lymph: no LAD  Data: Lab Results  Component Value Date   WBC 7.9 06/14/2022   HGB 12.4 (L) 06/14/2022   HCT 35.6 (L) 06/14/2022   MCV 104.7 (H) 06/14/2022   PLT 46 (L) 06/14/2022   Recent Labs  Lab 06/12/22 0627 06/13/22 0504 06/14/22 0447  HGB 13.3 13.8 12.4*   Lab Results  Component Value Date   NA 133 (L) 06/14/2022   K 3.7 06/14/2022   CL 105 06/14/2022   CO2 21 (L) 06/14/2022   BUN 38 (H) 06/14/2022   CREATININE 0.73 06/14/2022   Lab Results  Component Value Date   ALT 39 06/14/2022   AST 98 (H) 06/14/2022   GGT 109 (H) 08/03/2020   ALKPHOS 94 06/14/2022   BILITOT 13.8 (H) 06/14/2022   Recent Labs  Lab 06/23/2022 1132  APTT 28  INR 1.3*    Assessment/Plan:  57 y/o Caucasian male with a PMH of decompensated cirrhosis with HCC s/p TACE, EtOH abuse, hx of polysubstance abuse, T2DM, hx of chronic HCV, psoriasis, hx of CVA, HTN, HLD, dCHF, GERD, and BPH presented to [facility] 3/12 for chief complaint of altered mental status 2/2 hepatic encephalopathy  Hepatic encephalopathy/AMS  HCC s/p TACE  Decompensated liver cirrhosis 2/2 alcohol and HCV - MELD 3.0 (23), Child's C  Portal HTN - hyperbilirubinemia, coagulopathic,  thrombocytopenia  Cryptococcus meningitis - started Amphotericin-B  HFpEF  Electrolyte derangement  Hc of CVA  Hx of polysubstance abuse  DNR status   Recommendations:  - Prognosis is guarded. Patient has evidence of decompensated liver cirrhosis with hx of HCC s/p TACE with evidence of hepatic encephalopathy and opportunistic infection (cryptococcus).  - Continue amphotericin-B per pharmacy for cryptococcus. Appreciate ID recs.  - Continue to monitor daily LFTs and mental status exams - Continue aggressive treatment with lactulose TID via NGT or enemas if not able to receive orally - Continue Rifaximin 550 mg PO BID - Continue thiamine - Monitor electrolytes and replete as necessary per primary team - CIWA protocol  - Consider palliative care consult - Nothing further to offer at this point from GI standpoint.  - GI will sign off and follow peripherally  Please call Dr. Virgina Jock with questions or concerns.  Octavia Bruckner, PA-C [facility] [phone]

## 2022-06-15 DIAGNOSIS — B451 Cerebral cryptococcosis: Secondary | ICD-10-CM | POA: Diagnosis not present

## 2022-06-15 LAB — GASTROINTESTINAL PANEL BY PCR, STOOL (REPLACES STOOL CULTURE)

## 2022-06-15 LAB — CBC
HCT: 34 % — ABNORMAL LOW (ref 39.0–52.0)
Hemoglobin: 11.9 g/dL — ABNORMAL LOW (ref 13.0–17.0)
MCH: 36.4 pg — ABNORMAL HIGH (ref 26.0–34.0)
MCHC: 35 g/dL (ref 30.0–36.0)
MCV: 104 fL — ABNORMAL HIGH (ref 80.0–100.0)
Platelets: 57 10*3/uL — ABNORMAL LOW (ref 150–400)
RBC: 3.27 MIL/uL — ABNORMAL LOW (ref 4.22–5.81)
RDW: 18.3 % — ABNORMAL HIGH (ref 11.5–15.5)
WBC: 8 10*3/uL (ref 4.0–10.5)
nRBC: 0 % (ref 0.0–0.2)

## 2022-06-15 LAB — HEPATIC FUNCTION PANEL
ALT: 44 U/L (ref 0–44)
AST: 114 U/L — ABNORMAL HIGH (ref 15–41)
Albumin: 1.8 g/dL — ABNORMAL LOW (ref 3.5–5.0)
Alkaline Phosphatase: 98 U/L (ref 38–126)
Bilirubin, Direct: 8.8 mg/dL — ABNORMAL HIGH (ref 0.0–0.2)
Indirect Bilirubin: 8.4 mg/dL — ABNORMAL HIGH (ref 0.3–0.9)
Total Bilirubin: 17.2 mg/dL — ABNORMAL HIGH (ref 0.3–1.2)
Total Protein: 6.3 g/dL — ABNORMAL LOW (ref 6.5–8.1)

## 2022-06-15 LAB — MAGNESIUM: Magnesium: 2.1 mg/dL (ref 1.7–2.4)

## 2022-06-15 LAB — BASIC METABOLIC PANEL
Anion gap: 6 (ref 5–15)
BUN: 47 mg/dL — ABNORMAL HIGH (ref 6–20)
CO2: 20 mmol/L — ABNORMAL LOW (ref 22–32)
Calcium: 8 mg/dL — ABNORMAL LOW (ref 8.9–10.3)
Chloride: 108 mmol/L (ref 98–111)
Creatinine, Ser: 1.62 mg/dL — ABNORMAL HIGH (ref 0.61–1.24)
GFR, Estimated: 49 mL/min — ABNORMAL LOW (ref >60)
Glucose, Bld: 180 mg/dL — ABNORMAL HIGH (ref 70–99)
Potassium: 3.5 mmol/L (ref 3.5–5.1)
Sodium: 134 mmol/L — ABNORMAL LOW (ref 135–145)

## 2022-06-15 LAB — GLUCOSE, CAPILLARY
Glucose-Capillary: 156 mg/dL — ABNORMAL HIGH (ref 70–99)
Glucose-Capillary: 157 mg/dL — ABNORMAL HIGH (ref 70–99)
Glucose-Capillary: 160 mg/dL — ABNORMAL HIGH (ref 70–99)
Glucose-Capillary: 171 mg/dL — ABNORMAL HIGH (ref 70–99)
Glucose-Capillary: 180 mg/dL — ABNORMAL HIGH (ref 70–99)

## 2022-06-15 LAB — PHOSPHORUS: Phosphorus: 3.9 mg/dL (ref 2.5–4.6)

## 2022-06-15 MED ORDER — FOLIC ACID 1 MG PO TABS
1.0000 mg | ORAL_TABLET | Freq: Every day | ORAL | Status: DC
  Administered 2022-06-15 – 2022-06-16 (×2): 1 mg
  Filled 2022-06-15 (×2): qty 1

## 2022-06-15 MED ORDER — PHENOL 1.4 % MT LIQD: 2.0000 | Freq: Three times a day (TID) | OROMUCOSAL | Status: DC

## 2022-06-15 MED ORDER — PHENOL 1.4 % MT LIQD
1.0000 | Freq: Three times a day (TID) | OROMUCOSAL | Status: DC
  Administered 2022-06-15 – 2022-06-17 (×7): 1 via OROMUCOSAL
  Filled 2022-06-15: qty 177

## 2022-06-15 MED ORDER — LACTATED RINGERS IV SOLN: INTRAVENOUS | Status: DC

## 2022-06-15 MED ORDER — ADULT MULTIVITAMIN LIQUID CH
15.0000 mL | Freq: Every day | ORAL | Status: DC
  Administered 2022-06-15: 15 mL
  Filled 2022-06-15 (×4): qty 15

## 2022-06-15 NOTE — Progress Notes (Signed)
Patient's brother Dominica Severin updated at bedside on condition and plan of care. Dominica Severin states he lives beside patient and for the last week or so he had been having patient stay with him for safety concerns/decline.

## 2022-06-15 NOTE — Progress Notes (Signed)
PT Cancellation Note  Patient Details Name: Johnny Kelley MRN: [id number] DOB: 1965/09/01   Cancelled Treatment:    Reason Eval/Treat Not Completed: Patient not medically ready. Patient very lethargic, mitts donned. Unable to participate in PT at this time. Will continue to follow up later this week.    Anmol Fleck 06/15/2022, 9:14 AM

## 2022-06-15 NOTE — Progress Notes (Signed)
       CROSS COVER NOTE  NAME: Johnny Kelley MRN: [id number] DOB : 10-30-1965 ATTENDING PHYSICIAN: Val Riles, MD    Date of Service   06/15/2022   HPI/Events of Note   Report *** Stool (+) yersinia enterocolitica On Review of chart ***No current indication of sepsis or severe disease Bedside eval*** HPI***  Interventions   Assessment/Plan: No change tonight Consider Ceftriaxone +Aminoglycoside/ ID guidance X    *** professional thanks      To reach the provider On-Call:   7AM- 7PM see care teams to locate the attending and reach out to them via [email or web address] Password: [email or web address] 7PM-7AM contact night-coverage If you still have difficulty reaching the appropriate provider, please page the [facility] (Director on Call) for [facility] on amion for assistance  This document was prepared using Systems analyst and may include unintentional dictation errors.  Neomia Glass DNP, MBA, FNP-BC, PMHNP-BC Nurse Practitioner [facility] Pager [phone]

## 2022-06-15 NOTE — Progress Notes (Signed)
Stool sample sent.

## 2022-06-15 NOTE — Consult Note (Signed)
Pharmacy Antibiotic Note  Johnny Kelley is a 57 y.o. male admitted on 06/22/2022 with confusion. PMH significant for HTN, HLD, DM, dCHF, stroke, GERD, BPH, polysubstance abuse (alcohol, tobacco and cocaine), hepatocellular carcinoma (s/p of embolization treatment in [facility] per his brother), liver cirrhosis. On 3/15 a herpetic lesion was noticed on his upper lip, and LP with CSF studies was ordered. ME panel positive for Cryptococcus neoformans/gatti. Significant increase in Scr from 0.7 to 1.6 on 3/17. Pharmacy has been consulted for amphotericin B dosing.  Plan: Day 2 antifungals Continue Liposomal Amphotericin-B (3 mg/kg/day TBW) and add Flucytosine (25 mg/kg Q6H)  Liposomal Amphotericin B 260 mg IV daily  Give initial 500 mL NS bolus followed by D5 flush then administer AmphoB infusion After completion of AmphoB infusion, give D5 flush then 500 mL NS bolus AmphoB not compatible with NS, requires D5 flush in between AmphoB and NS administration  NS bolus required for reduction in nephrotoxicity risk Supportive medications PRN: demerol (rigors), diphenhydramine and acetaminophen (infusion reaction) Flucytosine 2250 mg per tube daily Monitor BMP + Mg daily Continue to monitor renal function closely, replace Mg and K as needed, and follow culture results   Height: 6\' 2"  (188 cm) Weight: 86.6 kg (190 lb 14.7 oz) IBW/kg (Calculated) : 82.2  Temp (24hrs), Avg:98.6 F (37 C), Min:97.6 F (36.4 C), Max:99 F (37.2 C)  Recent Labs  Lab 06/11/22 0718 06/11/22 1333 06/11/22 1623 06/12/22 0627 06/13/22 0504 06/13/22 2100 06/14/22 0447 06/15/22 0405  WBC 6.7  --   --  7.2 6.4  --  7.9 8.0  CREATININE 0.90  --   --  0.93 1.08 0.92 0.73 1.62*  LATICACIDVEN  --  3.0* 2.7*  --   --   --   --   --      Estimated Creatinine Clearance: 57.8 mL/min (A) (by C-G formula based on SCr of 1.62 mg/dL (H)).    No Known Allergies  Antimicrobials this admission: 3/13 Vancomycin >> 3/15 3/13  Ceftriaxone >> 3/15 3/15 Acyclovir >> 3/15 3/14 Rifaximin >> 3/15 Amphotericin B >> 3/16 Flucytosine >>  Dose adjustments this admission: 3/15 Vancomycin 1500 mg IV Q12H >> Vancomycin 1250 mg IV Q12H   Microbiology results: 3/13 BCx: 3 of 4 growing yeast, BCID Cryptococcus neoformans/gattii 3/12 MRSA PCR: positive 3/15 CSF Cx: encapsulated yeast consistent with other findings 3/15 CSF VRDL: IP 3/15 ME Panel: Cryptococcus neoformans/gattii detected 3/17 BCx: NG<12H  Thank you for allowing pharmacy to be a part of this patient's care.  Gretel Acre, PharmD PGY1 Pharmacy Resident 06/15/2022 7:42 AM

## 2022-06-15 NOTE — Progress Notes (Signed)
[facility] Progress Note  Patient: Johnny Kelley    [id number]  DOA: 06/13/2022     Date of Service: the patient was seen and examined on 06/15/2022  Chief Complaint  Patient presents with   Altered Mental Status   Brief hospital course: Johnny Kelley is a 57 y.o. male with medical history significant of hypertension, hyperlipidemia, diabetes mellitus, diastolic CHF, stroke, GERD, BPH, polysubstance abuse (alcohol, tobacco and cocaine), hepatocellular carcinoma (s/p of embolization treatment in [facility] per his brother), liver cirrhosis, who presents with altered mental status, fall and back pain. Per his brother (I called his brother by phone), patient has chronic back pain with abnormal MRI findings. Pt has an appointment with neurosurgeon, Dr. Cari Caraway on 3/14.  Due to back pain, patient has difficulty walking and decreased mobility.  It is very difficult for his brother to take care of him at home. Patient has been confused in the past several days. In the ED  patient was very lethargic, mildly confused, but still orientated x 3.  He did complain of mid and lower back pain, which is constant, severe, nonradiating.  He also has bilateral leg weakness.    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  CT head negative.  UDS negative. -Frequent neurocheck -Fall precaution -Lactulose 20 g 3 times daily via NG tube, s/p lactulose enema d/c'd  Patient had fever, empirically patient received ceftriaxone, vancomycin which has been discontinued after diagnosis of cryptococcal meningitis.  DC rifaximin as well. 3/15 herpetic lesion noticed on upper lip, may be viral encephalitis causing AMS Lumbar puncture ordered. S/p acyclovir IV was given which was discontinued after diagnosis of cryptococcal meningitis [place] abd was done for paracentesis but there was no fluid to  Tap, so could not r/o SBP 3/13 blood culture positive for Cryptococcus neoformans/gattii  3/15 CSF shows cryptococcal antigen titer 2560, CSF WBC 148-212, glucose 72, RBC 31, protein 195 3/15 NG tube was inserted for feeding, dietitian consulted. 3/15 started amphotericin B and flucytosine, pharmacy consulted for dosing 3/17 follow repeat blood cultures NGTD Follow ID consulted for further recommendation Follow TOC for placement   AKI, creatinine elevated Monitor renal functions and urine output daily Avoid nephrotoxic medications and use renally dose medications Started IV fluid for gentle hydration Urine retention, Foley catheter was inserted on 06/14/2022   Cirrhosis ([place]):  INR 1.3, PTT 28.  AST 98, ALT 40, total bilirubin 11.7.  Patient continues to drink alcohol. -Cautiously use Tylenol -Patient will need counseling about importance of quitting alcohol drinking Elevated bilirubin   Fall at home, initial encounter: CT head and neck negative. -Fall precaution -PT/OT   Hyperkalemia: Potassium 5.3 S/p Leukoma 5 g and s/p 40 mg Lasix due to elevated BNP K 3.5   Hypokalemia, potassium 3.2 on 3/15 Potassium was repleted. Hypophosphatemia, Phos repleted. Monitor electrolytes and replete as needed.  HTN (hypertension) D/c'd Amlodipine, HCTZ, metoprolol, due to hypotension -Hold losartan due to hyperkalemia S/p NS 500 mL bolus given 3/16 started midodrine 10 mg p.o. 3 times daily  3/15 SVT 6 followed by 16 beats, patient was unable to offer any complaint due to encephalopathy.  Lopressor 5 mg IV 1 dose given. mg 1.9 wnl 3/16 Started metoprolol 5 mg IV 3 times daily We will continue to monitor BP and titrate medications accordingly    History of CVA (cerebrovascular accident) -Patient is not taking Plavix due to easy bleeding  Chronic diastolic CHF (congestive heart failure) ([place]): 2D echo on 06/24/2021 showed EF of 60 to 65% with grade 1 diastolic dysfunction.  BNP is  elevated 418 and chest x-ray with vascular crowding, but patient only has trace leg edema, no JVT, no shortness of breath or oxygen desaturation.  Does not seem to have CHF exacerbation.  Patient is at risk of developing CHF exacerbation -s/p 1 dose of Lasix to 40 mg x 1  -Held HCTZ due to low blood pressure   Type 2 diabetes mellitus with diabetic neuropathy, without long-term current use of insulin ([place]): Recent A1c 5.7, well-controlled.  Patient taking Trulicity, metformin and Jardiance. -Sliding scale insulin 3/15 increased Semglee 22 units daily  BPH with obstruction/lower urinary tract symptoms -Resume Flomax when able to take oral medications   Thrombocytopenia ([place]): this is a chronic issue due to liver cirrhosis. Plts 38--48--46 -Follow-up CBC   Hyperbilirubinemia secondary to EtOH abuse AST 114 elevated, ALT WNL Total bilirubin 7.2, TB 8.8 and IB 8.4 Monitor LFTs  Alcohol abuse and Tobacco abuse -Did counseling about importance of quitting alcohol use and tobacco use -CIWA protocol -Nicotine patch   Back pain: Patient has chronic mid and lower back pain, with abnormal MRI findings as above.  Patient has an appointment with Dr. Cari Caraway of neurosurgery on 3/14 -Consulted Dr. Cari Caraway for neurosurgery, recommended no intervention until his mental status improves.   Body mass index is 24.48 kg/m.  Interventions:    Diet: NG tube feeding DVT Prophylaxis: SCD, pharmacological prophylaxis contraindicated due to thrombocytopenia and bleeding    Advance goals of care discussion: DNR  Family Communication: family was not present at bedside, at the time of interview.  The pt provided permission to discuss medical plan with the family. Opportunity was given to ask question and all questions were answered satisfactorily.  3/16 d/w patient's brother over the phone  Disposition:  Pt is from Home, admitted with AMS, found to have cryptococcal meningitis, on IV antifungal  medication, ID consult pending, which precludes a safe discharge. Discharge to SNF, when clinically stable.  Subjective: No significant overnight events, patient's mental status is slightly improving, he is trying to talk, mumbling and difficult to understand.  Unable to offer any complaints.    Physical Exam: General: NAD, lying comfortably Appear in no distress Eyes: closed, spontaneously opening briefly ENT: Oral Mucosa Clear, dry,  Neck: no JVD,  Cardiovascular: S1 and S2 Present, no Murmur,  Respiratory: good respiratory effort, Bilateral Air entry equal and Decreased, no Crackles, no wheezes Abdomen: Bowel Sound present, Soft and no tenderness, genital warts Skin: no rashes Extremities: no Pedal edema, no calf tenderness Neurologic: Encephalopathic, without any new focal findings Gait not checked due to patient safety concerns  Vitals:   06/14/22 2030 06/15/22 0435 06/15/22 0500 06/15/22 0714  BP: (!) 106/58 123/62  114/60  Pulse: 86 90  91  Resp: 20 20  18   Temp: 99 F (37.2 C) 99 F (37.2 C)  97.6 F (36.4 C)  TempSrc:      SpO2: 93% 93%  93%  Weight:   86.6 kg   Height:        Intake/Output Summary (Last 24 hours) at 06/15/2022 1131 Last data filed at 06/14/2022 1823 Gross per 24 hour  Intake 312.9 ml  Output 510 ml  Net -197.1 ml   Filed Weights   06/28/2022 1354 06/14/22 0500 06/15/22 0500  Weight: 86.5 kg 86.4 kg 86.6 kg    Data Reviewed: I have  personally reviewed and interpreted daily labs, tele strips, imagings as discussed above. I reviewed all nursing notes, pharmacy notes, vitals, pertinent old records I have discussed plan of care as described above with RN and patient/family.  CBC: Recent Labs  Lab 06/20/2022 1132 06/11/22 0718 06/12/22 0627 06/13/22 0504 06/14/22 0447 06/15/22 0405  WBC 6.1 6.7 7.2 6.4 7.9 8.0  NEUTROABS 3.9  --   --   --   --   --   HGB 14.4 15.0 13.3 13.8 12.4* 11.9*  HCT 40.5 42.5 37.6* 39.4 35.6* 34.0*  MCV 102.5*  102.4* 102.7* 103.1* 104.7* 104.0*  PLT 36* 40* 38* 48* 46* 57*   Basic Metabolic Panel: Recent Labs  Lab 06/11/22 1054 06/12/22 0627 06/13/22 0504 06/13/22 2100 06/14/22 0447 06/15/22 0405  NA  --  133* 137 137 133* 134*  K  --  3.6 3.2* 3.2* 3.7 3.5  CL  --  101 103 103 105 108  CO2  --  23 23 24  21* 20*  GLUCOSE  --  229* 207* 174* 241* 180*  BUN  --  28* 38* 37* 38* 47*  CREATININE  --  0.93 1.08 0.92 0.73 1.62*  CALCIUM  --  8.9 8.7* 8.7* 8.3* 8.0*  MG 1.9 1.9 1.9 1.9 2.2 2.1  PHOS 4.2 3.7 3.5  --  2.2* 3.9    Studies: DG Abd 1 View  Result Date: 06/14/2022 CLINICAL DATA:  NG tube placement EXAM: ABDOMEN - 1 VIEW COMPARISON:  06/13/2022 FINDINGS: Esophagogastric tube with tip and side port below the diaphragm. Nonobstructive pattern of included bowel gas. No free air. IMPRESSION: Esophagogastric tube with tip and side port below the diaphragm. Electronically Signed   By: Delanna Ahmadi M.D.   On: 06/14/2022 12:24    Scheduled Meds:  Chlorhexidine Gluconate Cloth  6 each Topical Q0600   dextrose  10 mL Intravenous Q24H   dextrose  10 mL Intravenous Q24H   flucytosine  25 mg/kg Per Tube [id number]   folic acid  1 mg Per Tube Daily   free water  30 mL Per Tube Q4H   insulin aspart  0-5 Units Subcutaneous QHS   insulin aspart  0-9 Units Subcutaneous TID WC   insulin glargine-yfgn  22 Units Subcutaneous Daily   lactulose  20 g Per Tube TID   lidocaine  1 patch Transdermal Q24H   metoprolol tartrate  5 mg Intravenous TID   midodrine  10 mg Per NG tube TID WC   multivitamin  15 mL Per Tube Daily   mupirocin ointment  1 Application Nasal BID   nicotine  21 mg Transdermal Daily   pantoprazole (PROTONIX) IV  40 mg Intravenous Q24H   phenol  1 spray Mouth/Throat TID   sodium chloride  500 mL Intravenous Q24H   sodium chloride  500 mL Intravenous Q24H   thiamine  100 mg Oral Daily   Or   thiamine  100 mg Intravenous Daily   Continuous Infusions:  amphotericin B liposome  (AMBISOME) 260 mg in dextrose 5 % 500 mL IVPB 260 mg (06/14/22 2238)   chlorproMAZINE (THORAZINE) 12.5 mg in sodium chloride 0.9 % 25 mL IVPB Stopped (06/11/22 0952)   feeding supplement (GLUCERNA 1.5 CAL) 20 mL/hr at 06/14/22 1605   lactated ringers 75 mL/hr at 06/15/22 0847   PRN Meds: acetaminophen, acetaminophen, chlorproMAZINE (THORAZINE) 12.5 mg in sodium chloride 0.9 % 25 mL IVPB, diphenhydrAMINE **OR** diphenhydrAMINE, hydrALAZINE, LORazepam, meperidine (DEMEROL) injection, ondansetron (ZOFRAN) IV, oxyCODONE  Time spent: 50 minutes  Author: Val Riles. MD [facility] 06/15/2022 11:31 AM  To reach On-call, see care teams to locate the attending and reach out to them via [email or web address]. If 7PM-7AM, please contact night-coverage If you still have difficulty reaching the attending provider, please page the [facility] (Director on Call) for [facility] on amion for assistance.

## 2022-06-16 ENCOUNTER — Inpatient Hospital Stay: Payer: Medicaid Other

## 2022-06-16 DIAGNOSIS — D696 Thrombocytopenia, unspecified: Secondary | ICD-10-CM | POA: Diagnosis not present

## 2022-06-16 DIAGNOSIS — E440 Moderate protein-calorie malnutrition: Secondary | ICD-10-CM | POA: Insufficient documentation

## 2022-06-16 DIAGNOSIS — F1010 Alcohol abuse, uncomplicated: Secondary | ICD-10-CM | POA: Diagnosis not present

## 2022-06-16 DIAGNOSIS — B451 Cerebral cryptococcosis: Secondary | ICD-10-CM | POA: Diagnosis not present

## 2022-06-16 LAB — BASIC METABOLIC PANEL
Anion gap: 10 (ref 5–15)
BUN: 58 mg/dL — ABNORMAL HIGH (ref 6–20)
CO2: 18 mmol/L — ABNORMAL LOW (ref 22–32)
Calcium: 8.3 mg/dL — ABNORMAL LOW (ref 8.9–10.3)
Chloride: 106 mmol/L (ref 98–111)
Creatinine, Ser: 1.78 mg/dL — ABNORMAL HIGH (ref 0.61–1.24)
GFR, Estimated: 44 mL/min — ABNORMAL LOW (ref >60)
Glucose, Bld: 205 mg/dL — ABNORMAL HIGH (ref 70–99)
Potassium: 3.6 mmol/L (ref 3.5–5.1)
Sodium: 134 mmol/L — ABNORMAL LOW (ref 135–145)

## 2022-06-16 LAB — PHOSPHORUS: Phosphorus: 3.7 mg/dL (ref 2.5–4.6)

## 2022-06-16 LAB — HCV RNA QUANT
HCV Quantitative Log: 3.111 log10 IU/mL (ref >1.70)
HCV Quantitative: 1290 IU/mL (ref >50)

## 2022-06-16 LAB — HELPER T-LYMPH-CD4 (ARMC ONLY)
% CD 4 Pos. Lymph.: 58.5 % (ref 30.8–58.5)
Absolute CD 4 Helper: 585 /uL (ref 359–1519)
Basophils Absolute: 0 10*3/uL (ref 0.0–0.2)
Basos: 1 %
EOS (ABSOLUTE): 0.1 10*3/uL (ref 0.0–0.4)
Eos: 2 %
Hematocrit: 32.6 % — ABNORMAL LOW (ref 37.5–51.0)
Hemoglobin: 12.3 g/dL — ABNORMAL LOW (ref 13.0–17.7)
Immature Grans (Abs): 0.1 10*3/uL (ref 0.0–0.1)
Immature Granulocytes: 1 %
Lymphocytes Absolute: 1 10*3/uL (ref 0.7–3.1)
Lymphs: 12 %
MCH: 36.6 pg — ABNORMAL HIGH (ref 26.6–33.0)
MCHC: 37.7 g/dL — ABNORMAL HIGH (ref 31.5–35.7)
MCV: 97 fL (ref 79–97)
Monocytes Absolute: 1 10*3/uL — ABNORMAL HIGH (ref 0.1–0.9)
Monocytes: 11 %
Neutrophils Absolute: 6.3 10*3/uL (ref 1.4–7.0)
Neutrophils: 73 %
Platelets: 51 10*3/uL — ABNORMAL LOW (ref 150–450)
RBC: 3.36 x10E6/uL — ABNORMAL LOW (ref 4.14–5.80)
RDW: 14.6 % (ref 11.6–15.4)
WBC: 8.5 10*3/uL (ref 3.4–10.8)

## 2022-06-16 LAB — CBC
HCT: 37.9 % — ABNORMAL LOW (ref 39.0–52.0)
Hemoglobin: 13.1 g/dL (ref 13.0–17.0)
MCH: 35.9 pg — ABNORMAL HIGH (ref 26.0–34.0)
MCHC: 34.6 g/dL (ref 30.0–36.0)
MCV: 103.8 fL — ABNORMAL HIGH (ref 80.0–100.0)
Platelets: 61 10*3/uL — ABNORMAL LOW (ref 150–400)
RBC: 3.65 MIL/uL — ABNORMAL LOW (ref 4.22–5.81)
RDW: 17.9 % — ABNORMAL HIGH (ref 11.5–15.5)
WBC: 6.8 10*3/uL (ref 4.0–10.5)
nRBC: 0 % (ref 0.0–0.2)

## 2022-06-16 LAB — PATHOLOGIST SMEAR REVIEW

## 2022-06-16 LAB — HEPATIC FUNCTION PANEL
ALT: 54 U/L — ABNORMAL HIGH (ref 0–44)
AST: 131 U/L — ABNORMAL HIGH (ref 15–41)
Albumin: 1.8 g/dL — ABNORMAL LOW (ref 3.5–5.0)
Alkaline Phosphatase: 143 U/L — ABNORMAL HIGH (ref 38–126)
Bilirubin, Direct: 12 mg/dL — ABNORMAL HIGH (ref 0.0–0.2)
Indirect Bilirubin: 9.2 mg/dL — ABNORMAL HIGH (ref 0.3–0.9)
Total Bilirubin: 21 mg/dL (ref 0.3–1.2)
Total Protein: 6.5 g/dL (ref 6.5–8.1)

## 2022-06-16 LAB — CULTURE, BLOOD (ROUTINE X 2): Specimen Description: ADEQUATE

## 2022-06-16 LAB — CSF CULTURE W GRAM STAIN

## 2022-06-16 LAB — GLUCOSE, CAPILLARY
Glucose-Capillary: 107 mg/dL — ABNORMAL HIGH (ref 70–99)
Glucose-Capillary: 135 mg/dL — ABNORMAL HIGH (ref 70–99)
Glucose-Capillary: 142 mg/dL — ABNORMAL HIGH (ref 70–99)
Glucose-Capillary: 186 mg/dL — ABNORMAL HIGH (ref 70–99)
Glucose-Capillary: 194 mg/dL — ABNORMAL HIGH (ref 70–99)
Glucose-Capillary: 217 mg/dL — ABNORMAL HIGH (ref 70–99)

## 2022-06-16 LAB — VDRL, CSF: VDRL Quant, CSF: NONREACTIVE

## 2022-06-16 LAB — MAGNESIUM: Magnesium: 2.2 mg/dL (ref 1.7–2.4)

## 2022-06-16 MED ORDER — ACETAMINOPHEN 500 MG PO TABS: 500.0000 mg | ORAL_TABLET | Freq: Four times a day (QID) | ORAL | Status: DC | PRN

## 2022-06-16 MED ORDER — OXYCODONE HCL 5 MG PO TABS
5.0000 mg | ORAL_TABLET | Freq: Four times a day (QID) | ORAL | Status: DC | PRN
  Administered 2022-06-17: 5 mg
  Filled 2022-06-16: qty 1

## 2022-06-16 MED ORDER — CLOTRIMAZOLE 1 % EX CREA
TOPICAL_CREAM | Freq: Two times a day (BID) | CUTANEOUS | Status: DC
  Filled 2022-06-16: qty 15

## 2022-06-16 MED ORDER — NYSTATIN 100000 UNIT/GM EX POWD: Freq: Two times a day (BID) | CUTANEOUS | Status: DC | PRN

## 2022-06-16 MED ORDER — THIAMINE MONONITRATE 100 MG PO TABS: 100.0000 mg | ORAL_TABLET | Freq: Every day | ORAL | Status: DC

## 2022-06-16 MED ORDER — THIAMINE HCL 100 MG/ML IJ SOLN
100.0000 mg | Freq: Every day | INTRAMUSCULAR | Status: DC
  Administered 2022-06-16 – 2022-06-18 (×3): 100 mg via INTRAVENOUS
  Filled 2022-06-16 (×3): qty 2

## 2022-06-16 MED ORDER — CIPROFLOXACIN IN D5W 400 MG/200ML IV SOLN
400.0000 mg | Freq: Two times a day (BID) | INTRAVENOUS | Status: DC
  Administered 2022-06-16 – 2022-06-17 (×4): 400 mg via INTRAVENOUS
  Filled 2022-06-16 (×5): qty 200

## 2022-06-16 MED ORDER — ACETAMINOPHEN 325 MG PO TABS: 650.0000 mg | ORAL_TABLET | Freq: Every day | ORAL | Status: DC | PRN

## 2022-06-16 NOTE — Progress Notes (Signed)
Pt return from radiology with BM. Cleaned with return to floor. Pt with MASD to groin bilateral thighs and buttocks. Area cleansed and barrier cream applied.

## 2022-06-16 NOTE — Progress Notes (Signed)
Mandy from the LAB called in a critical Total Bilirubin of 21. Made primary nurse and MD aware.

## 2022-06-16 NOTE — Progress Notes (Signed)
Occupational Therapy Treatment Patient Details Name: Johnny Kelley MRN: [id number] DOB: 1965/11/29 Today's Date: 06/16/2022   History of present illness Patient is a 57 year old male with presents with altered mental status, fall and back pain. History of hypertension, hyperlipidemia, diabetes mellitus, diastolic CHF, stroke, GERD, BPH, polysubstance abuse, hepatocellular carcinoma.   OT comments  Upon entering the room, pt supine in bed with B hand mitts donned. OT turning on all overhead lights to create an alerting environment. Pt does open eyes briefly and look at therapist. Pt unable to follow and commands this session. Total A to attempt supine <>sit with pt groaning, "Nooo" at therapist and attempting to pull LEs back into bed and becoming very resistive. Pt appears to be experiencing discomfort to bed mobility. Pt remains very limited this session as far as active participation. OT will re-assess pt next session for appropriateness.    Recommendations for follow up therapy are one component of a multi-disciplinary discharge planning process, led by the attending physician.  Recommendations may be updated based on patient status, additional functional criteria and insurance authorization.    Follow Up Recommendations  Skilled nursing-short term rehab (<3 hours/day)     Assistance Recommended at Discharge Frequent or constant Supervision/Assistance  Patient can return home with the following  Two people to help with bathing/dressing/bathroom;Two people to help with walking and/or transfers;Help with stairs or ramp for entrance;Assist for transportation;Assistance with cooking/housework;Direct supervision/assist for financial management;Direct supervision/assist for medications management   Equipment Recommendations  Other (comment) (defer to next venue of care)       Precautions / Restrictions Precautions Precautions: Fall       Mobility Bed Mobility Overal bed mobility:  Needs Assistance Bed Mobility: Rolling, Supine to Sit, Sit to Supine Rolling: Total assist   Supine to sit: Total assist Sit to supine: Total assist   General bed mobility comments: PT does not give any effort to assist with mobility. He becomes very resistive and attempts to return himself to supine.    Transfers                   General transfer comment: not attemped secondary to safety concerns         ADL either performed or assessed with clinical judgement    Extremity/Trunk Assessment Upper Extremity Assessment Upper Extremity Assessment: Generalized weakness   Lower Extremity Assessment Lower Extremity Assessment: Generalized weakness        Vision Patient Visual Report: No change from baseline            Cognition Arousal/Alertness: Lethargic Behavior During Therapy: Flat affect Overall Cognitive Status: No family/caregiver present to determine baseline cognitive functioning                                                     Pertinent Vitals/ Pain       Pain Assessment Pain Assessment: Faces Faces Pain Scale: Hurts even more Pain Location: generalized with bed mobility Pain Descriptors / Indicators: Discomfort, Grimacing, Guarding Pain Intervention(s): Limited activity within patient's tolerance, Monitored during session, Repositioned         Frequency  Min 2X/week        Progress Toward Goals  OT Goals(current goals can now be found in the care plan section)  Progress towards OT goals: OT to reassess next  treatment     Plan Discharge plan remains appropriate;Frequency remains appropriate       AM-PAC OT "6 Clicks" Daily Activity     Outcome Measure   Help from another person eating meals?: Total Help from another person taking care of personal grooming?: Total Help from another person toileting, which includes using toliet, bedpan, or urinal?: Total Help from another person bathing (including washing,  rinsing, drying)?: Total Help from another person to put on and taking off regular upper body clothing?: Total Help from another person to put on and taking off regular lower body clothing?: Total 6 Click Score: 6    End of Session    OT Visit Diagnosis: Muscle weakness (generalized) (M62.81);Repeated falls (R29.6);History of falling (Z91.81)   Activity Tolerance Patient limited by lethargy   Patient Left in bed;with call bell/phone within reach;with bed alarm set   Nurse Communication Mobility status        Time: [id number] OT Time Calculation (min): 14 min  Charges: OT General Charges $OT Visit: 1 Visit OT Treatments $Therapeutic Activity: 8-22 mins  Darleen Crocker, MS, OTR/L , CBIS ascom [phone]  06/16/22, 1:58 PM

## 2022-06-16 NOTE — Consult Note (Signed)
NAME: Johnny Kelley  DOB: 1965-07-28  MRN: [id number]  Date/Time: 06/16/2022 1:18 PM  REQUESTING PROVIDER: Dr.Kumar Subjective:  REASON FOR CONSULT: cryptococcal meningitis ? Johnny Kelley is a 57 y.o. male with a history of ETOH abuse, liver cirrhosis, unable to walk for several weeks Hepatocellular ca s/p thermal ablation in 2021 with possible residual toumor in MRI  Abnormal MRI lumbar spine  questioning red marrow conversion VS LPD. Biopsy neg, HEPC, alcohol , liver cirrhosis presented to  the ED on 05/31/2022 brought in by EMS because of altered mental status fall and back pain. Patient is a poor historian because of his mental status and hence chart was reviewed. Patient has history of chronic back pain with MRI done in February 2024 showing diffuse marrow heterogeneity question ending either an marrow infiltrative process or anemia, smoking obesity or advanced age.   Patient has difficulty the walking and with decreased mobility. Patient has a history of polysubstance abuse. While in the ED he was lethargic and confused but still oriented x 3. Vitals in the ED showed a BP of 130/70, temperature 100.4, pulse 101, sats 86%. Labs revealed WBC of 6.1, hemoglobin of 14.4 and platelet of 36.  Creatinine was 0.61. Total bili was 11.7. He was hospitalized as hepatic encephalopathy decompensated with severe thrombocytopenia and hyperbilirubinemia.  CT head and cervical spine were essentially normal. Blood cultures were sent and he was started on vancomycin and ceftriaxone.  Also lactulose and rifaximin were started.  On 06/13/2022 a herpetic lesion was noted on the upper lip and viral encephalitis was question and lumbar puncture was ordered and he was started on acyclovir.  Ultrasound abdomen done for paracentesis did not show any fluid to tap.  He underwent lumbar puncture on 06/13/2022 and WBC to 12, lymphocytes 63%, glucose 72 and total protein 195.  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  HSV was negative.  Also blood culture came back positive for cryptococcus.  He was started on IV Amphotericin .  On-call ID was consulted over the weekend and Dr. Juleen China ordered crag and added flucytosine. Because of urinary retention Foley catheter was was inserted  Patient had diarrhea and stool examination showed Oseni antibiotic and he was started on ciprofloxacin today.  Past Medical History:  Diagnosis Date   Cancer, hepatocellular ([place])    Diabetes mellitus without complication ([place])    Hepatitis C    Hypertension    Psoriasis    Stroke ([place])    Hx stroke in 2020   Substance abuse [facility])     Past Surgical History:  Procedure Laterality Date   IR RADIOLOGIST EVAL & MGMT  01/19/2020   IR RADIOLOGIST EVAL & MGMT  01/31/2020   IR RADIOLOGIST EVAL & MGMT  04/12/2020   IR RADIOLOGIST EVAL & MGMT  08/30/2020   KNEE SURGERY Right     Social History   Socioeconomic History   Marital status: Unknown    Spouse name: Not on file   Number of children: Not on file   Years of education: Not on file   Highest education level: Not on file  Occupational History   Occupation: unemployed  Tobacco Use   Smoking status: Every Day    Packs/day: 0.10    Years: 38.00    Additional pack years: 0.00    Total pack years: 3.80    Types: Cigarettes   Smokeless tobacco: Never  Vaping Use   Vaping Use: Never used  Substance  and Sexual Activity   Alcohol use: Yes    Alcohol/week: 21.0 standard drinks of alcohol    Types: 21 Cans of beer per week    Comment: 2-3 beers a day   Drug use: Not Currently    Frequency: 1.0 times per week    Types: Marijuana, Cocaine    Comment: reports marijuana use once per week and cocaine use 1-2 monthper month   Sexual activity: Not Currently  Other Topics Concern   Not on file  Social History Narrative   Not on file   Social Determinants of Health   Financial Resource Strain: Not on file  Food Insecurity:  No Food Insecurity (06/15/2022)   Hunger Vital Sign    Worried About Running Out of Food in the Last Year: Never true    Ran Out of Food in the Last Year: Never true  Transportation Needs: No Transportation Needs (06/12/2022)   PRAPARE - Hydrologist (Medical): No    Lack of Transportation (Non-Medical): No  Physical Activity: Not on file  Stress: Stress Concern Present (12/01/2019)   [place]    Feeling of Stress : To some extent  Social Connections: Socially Isolated (12/01/2019)   Social Connection and Isolation Panel [NHANES]    Frequency of Communication with Friends and Family: Never    Frequency of Social Gatherings with Friends and Family: More than three times a week    Attends Religious Services: Never    Marine scientist or Organizations: No    Attends Archivist Meetings: Never    Marital Status: Divorced  Human resources officer Violence: Not At Risk (06/22/2022)   Humiliation, Afraid, Rape, and Kick questionnaire    Fear of Current or Ex-Partner: No    Emotionally Abused: No    Physically Abused: No    Sexually Abused: No    Family History  Problem Relation Age of Onset   Diabetes Mother    Colon cancer Father    Diabetes Father    Kidney disease Maternal Grandmother    Lung cancer Maternal Grandfather    Hyperlipidemia Paternal Grandmother    COPD Paternal Grandfather    No Known Allergies I? Current Facility-Administered Medications  Medication Dose Route Frequency Provider Last Rate Last Admin   acetaminophen (TYLENOL) tablet 500 mg  500 mg Per Tube Q6H PRN Val Riles, MD       acetaminophen (TYLENOL) tablet 650 mg  650 mg Per Tube Daily PRN Val Riles, MD       amphotericin B liposome (AMBISOME) 260 mg in dextrose 5 % 500 mL IVPB  3 mg/kg Intravenous Daily Val Riles, MD   Stopped at 06/16/22 0123   Chlorhexidine Gluconate Cloth 2 % PADS 6 each  6  each Topical Q0600 Val Riles, MD   6 each at 06/16/22 0622   chlorproMAZINE (THORAZINE) 12.5 mg in sodium chloride 0.9 % 25 mL IVPB  12.5 mg Intravenous Q8H PRN Ivor Costa, MD 50 mL/hr at 06/16/22 1147 Infusion Verify at 06/16/22 1147   ciprofloxacin (CIPRO) IVPB 400 mg  400 mg Intravenous Q12H Val Riles, MD 200 mL/hr at 06/16/22 1147 Infusion Verify at 06/16/22 1147   dextrose 5 % 10 mL  10 mL Intravenous Q24H Val Riles, MD   10 mL at 06/15/22 2343   dextrose 5 % 10 mL  10 mL Intravenous Q24H Val Riles, MD   10 mL at 06/16/22 0125  diphenhydrAMINE (BENADRYL) injection 25 mg  25 mg Intravenous Daily PRN Val Riles, MD       Or   diphenhydrAMINE (BENADRYL) capsule 25 mg  25 mg Oral Daily PRN Val Riles, MD       feeding supplement (GLUCERNA 1.5 CAL) liquid 1,000 mL  1,000 mL Per Tube Continuous Val Riles, MD   Stopped at 06/16/22 1030   flucytosine (ANCOBON) capsule 2,250 mg  25 mg/kg Per Tube Q6H Alison Murray, RPH   2,250 mg at [id number]   folic acid (FOLVITE) tablet 1 mg  1 mg Per Tube Daily Coulter, Hoyle Sauer, RPH   1 mg at 06/16/22 0845   free water 30 mL  30 mL Per Tube Q4H Val Riles, MD   30 mL at 06/16/22 0847   hydrALAZINE (APRESOLINE) injection 5 mg  5 mg Intravenous Q2H PRN Ivor Costa, MD       insulin aspart (novoLOG) injection 0-5 Units  0-5 Units Subcutaneous QHS Ivor Costa, MD   2 Units at 06/11/22 2103   insulin aspart (novoLOG) injection 0-9 Units  0-9 Units Subcutaneous TID WC Ivor Costa, MD   1 Units at 06/16/22 1132   insulin glargine-yfgn (SEMGLEE) injection 22 Units  22 Units Subcutaneous Daily Val Riles, MD   22 Units at 06/16/22 0847   lactulose (CHRONULAC) 10 GM/15ML solution 20 g  20 g Per Tube TID Val Riles, MD   20 g at 06/15/22 1656   lidocaine (LIDODERM) 5 % 1 patch  1 patch Transdermal Q24H Ivor Costa, MD   1 patch at 06/15/22 2147   LORazepam (ATIVAN) injection 2 mg  2 mg Intravenous Q5 min PRN Val Riles, MD   2 mg at  06/13/22 1502   meperidine (DEMEROL) injection 25 mg  25 mg Intravenous Q15 min PRN Val Riles, MD       metoprolol tartrate (LOPRESSOR) injection 5 mg  5 mg Intravenous TID Val Riles, MD   5 mg at 06/16/22 0843   midodrine (PROAMATINE) tablet 10 mg  10 mg Per NG tube TID WC Val Riles, MD   10 mg at 06/16/22 0845   multivitamin liquid 15 mL  15 mL Per Tube Daily Coulter, Hoyle Sauer, RPH   15 mL at 06/15/22 0949   mupirocin ointment (BACTROBAN) 2 % 1 Application  1 Application Nasal BID Val Riles, MD   1 Application at [id number]   nicotine (NICODERM CQ - dosed in mg/24 hours) patch 21 mg  21 mg Transdermal Daily Ivor Costa, MD   21 mg at 06/16/22 0847   ondansetron (ZOFRAN) injection 4 mg  4 mg Intravenous Q8H PRN Ivor Costa, MD       oxyCODONE (Oxy IR/ROXICODONE) immediate release tablet 5 mg  5 mg Per Tube Q6H PRN Val Riles, MD       pantoprazole (PROTONIX) injection 40 mg  40 mg Intravenous Q24H Val Riles, MD   40 mg at 06/16/22 0843   phenol (CHLORASEPTIC) mouth spray 1 spray  1 spray Mouth/Throat TID Val Riles, MD   1 spray at 06/16/22 0848   sodium chloride 0.9 % bolus 500 mL  500 mL Intravenous Q24H Val Riles, MD   Infusion Verify at 06/16/22 1147   sodium chloride 0.9 % bolus 500 mL  500 mL Intravenous Q24H Val Riles, MD   500 mL at 06/16/22 0127   thiamine (VITAMIN B1) tablet 100 mg  100 mg Per Tube Daily Val Riles, MD  Or   thiamine (VITAMIN B1) injection 100 mg  100 mg Intravenous Daily Val Riles, MD   100 mg at 06/16/22 1132     Abtx:  Anti-infectives (From admission, onward)    Start     Dose/Rate Route Frequency Ordered Stop   06/16/22 1000  ciprofloxacin (CIPRO) IVPB 400 mg        400 mg 200 mL/hr over 60 Minutes Intravenous Every 12 hours 06/16/22 0852 06/23/22 0959   06/14/22 1800  flucytosine (ANCOBON) capsule 2,250 mg  Status:  Discontinued        25 mg/kg  86.4 kg Oral Every 6 hours 06/14/22 1241 06/14/22 1247   06/14/22  1800  flucytosine (ANCOBON) capsule 2,250 mg        25 mg/kg  86.4 kg Per Tube Every 6 hours 06/14/22 1247     06/14/22 1200  flucytosine (ANCOBON) capsule 2,250 mg  Status:  Discontinued        25 mg/kg  86.4 kg Per Tube Every 6 hours 06/14/22 0825 06/14/22 1242   06/14/22 0215  amphotericin B liposome (AMBISOME) 260 mg in dextrose 5 % 500 mL IVPB        3 mg/kg  86.5 kg 282.5 mL/hr over 120 Minutes Intravenous Daily 06/14/22 0212     06/13/22 2200  amphotericin B liposome (AMBISOME) 430 mg in dextrose 5 % 500 mL IVPB  Status:  Discontinued        5 mg/kg  86.5 kg 303.8 mL/hr over 120 Minutes Intravenous Daily 06/13/22 2011 06/13/22 2028   06/13/22 2200  amphotericin B liposome (AMBISOME) 260 mg in dextrose 5 % 500 mL IVPB  Status:  Discontinued        3 mg/kg  86.5 kg 282.5 mL/hr over 120 Minutes Intravenous Daily 06/13/22 2028 06/14/22 0212   06/13/22 1615  rifaximin (XIFAXAN) tablet 550 mg  Status:  Discontinued        550 mg Per Tube 2 times daily 06/13/22 1521 06/14/22 0913   06/13/22 1600  vancomycin (VANCOREADY) IVPB 1250 mg/250 mL  Status:  Discontinued        1,250 mg 166.7 mL/hr over 90 Minutes Intravenous Every 12 hours 06/13/22 0728 06/14/22 0839   06/13/22 1600  acyclovir (ZOVIRAX) 850 mg in dextrose 5 % 250 mL IVPB  Status:  Discontinued        850 mg 267 mL/hr over 60 Minutes Intravenous Every 8 hours 06/13/22 1435 06/14/22 0839   06/12/22 1000  rifaximin (XIFAXAN) tablet 550 mg  Status:  Discontinued        550 mg Oral 2 times daily 06/11/22 1357 06/13/22 1524   06/12/22 0400  vancomycin (VANCOREADY) IVPB 1500 mg/300 mL  Status:  Discontinued        1,500 mg 150 mL/hr over 120 Minutes Intravenous Every 12 hours 06/11/22 1501 06/13/22 0728   06/11/22 1430  vancomycin (VANCOREADY) IVPB 2000 mg/400 mL        2,000 mg 200 mL/hr over 120 Minutes Intravenous  Once 06/11/22 1336 06/11/22 1731   06/11/22 1400  cefTRIAXone (ROCEPHIN) 2 g in sodium chloride 0.9 % 100 mL IVPB   Status:  Discontinued        2 g 200 mL/hr over 30 Minutes Intravenous Every 24 hours 06/11/22 1308 06/14/22 0839       REVIEW OF SYSTEMS:  Unable to get a good history as patient just mumbles.   Objective:  VITALS:  BP 131/75 (BP Location: Right Arm)  Pulse 86   Temp (!) 97.5 F (36.4 C) (Oral)   Resp 20   Ht 6\' 2"  (1.88 m)   Wt 90.2 kg   SpO2 94%   BMI 25.53 kg/m  LDA Foley  PHYSICAL EXAM:  General: Lethargic, speech indecipherable Does appear to be oriented in place and person. Head: Normocephalic, without obvious abnormality, atraumatic. Eyes: , icteric sclerae.  ENT cannot examine  neck: , symmetrical, no adenopathy, thyroid: non tender no carotid bruit and no JVD.  Lungs: Bilateral air entry. Decreased bases Heart: Regular rate and rhythm, no murmur, rub or gallop. Abdomen: Soft, non-tender,not distended. Bowel sounds normal. No masses Verrucous , pigmented lesion over the mons pubis extremities: fading bruises over legs Skin: icteric Lymph: Cervical, supraclavicular normal. Neurologic: moves all limbs Pertinent Labs Lab Results CBC    Component Value Date/Time   WBC 6.8 06/16/2022 0542   RBC 3.65 (L) 06/16/2022 0542   HGB 13.1 06/16/2022 0542   HGB 15.7 05/01/2021 0840   HCT 37.9 (L) 06/16/2022 0542   HCT 45.0 05/01/2021 0840   PLT 61 (L) 06/16/2022 0542   PLT 91 (LL) 05/01/2021 0840   MCV 103.8 (H) 06/16/2022 0542   MCV 96 05/01/2021 0840   MCH 35.9 (H) 06/16/2022 0542   MCHC 34.6 06/16/2022 0542   RDW 17.9 (H) 06/16/2022 0542   RDW 12.9 05/01/2021 0840   LYMPHSABS 1.2 06/16/2022 1132   LYMPHSABS 1.5 05/01/2021 0840   MONOABS 0.8 06/11/2022 1132   EOSABS 0.0 06/02/2022 1132   EOSABS 0.2 05/01/2021 0840   BASOSABS 0.1 06/23/2022 1132   BASOSABS 0.0 05/01/2021 0840       Latest Ref Rng & Units 06/16/2022    5:42 AM 06/15/2022    4:05 AM 06/14/2022    4:47 AM  CMP  Glucose 70 - 99 mg/dL 205  180  241   BUN 6 - 20 mg/dL 58  47  38    Creatinine 0.61 - 1.24 mg/dL 1.78  1.62  0.73   Sodium 135 - 145 mmol/L 134  134  133   Potassium 3.5 - 5.1 mmol/L 3.6  3.5  3.7   Chloride 98 - 111 mmol/L 106  108  105   CO2 22 - 32 mmol/L 18  20  21    Calcium 8.9 - 10.3 mg/dL 8.3  8.0  8.3   Total Protein 6.5 - 8.1 g/dL 6.5  6.3  6.3   Total Bilirubin 0.3 - 1.2 mg/dL 21.0  17.2  13.8   Alkaline Phos 38 - 126 U/L 143  98  94   AST 15 - 41 U/L 131  114  98   ALT 0 - 44 U/L 54  44  39       Microbiology: Recent Results (from the past 240 hour(s))  MRSA Next Gen by PCR, Nasal     Status: Abnormal   Collection Time: 06/05/2022  6:34 PM   Specimen: Nasal Mucosa; Nasal Swab  Result Value Ref Range Status   MRSA by PCR Next Gen DETECTED (A) NOT DETECTED Final    Comment: CRITICAL RESULT CALLED TO, READ BACK BY AND VERIFIED WITH: DONNA HESLEP RN @0047  06/11/22 ASW (NOTE) The GeneXpert MRSA Assay (FDA approved for NASAL specimens only), is one component of a comprehensive MRSA colonization surveillance program. It is not intended to diagnose MRSA infection nor to guide or monitor treatment for MRSA infections. Test performance is not FDA approved in patients less than 58 years old. Performed  at [facility], [address] [place], [place] [address]   Culture, blood (Routine X 2) w Reflex to ID Panel     Status: Abnormal (Preliminary result)   Collection Time: 06/11/22 10:54 AM   Specimen: BLOOD  Result Value Ref Range Status   Specimen Description   Final    BLOOD Blood Culture adequate volume Performed at [facility], [address], [place], [place] [address]    Special Requests   Final    BOTTLES DRAWN AEROBIC AND ANAEROBIC LEFT ANTECUBITAL Performed at [facility], [address] [place], [place] [address]    Culture  Setup Time   Final    AEROBIC BOTTLE ONLY BUDDING YEAST SEEN CRITICAL RESULT CALLED TO, READ BACK BY AND VERIFIED WITH: CAROLYN COULTER ON 06/14/22 AT 0752 QSD    Culture  (A)  Final    CRYPTOCOCCUS NEOFORMANS Sent to [place] for further susceptibility testing. Performed at [facility], [place] [address], [place], [place] [address]    Report Status PENDING  Incomplete  Culture, blood (Routine X 2) w Reflex to ID Panel     Status: Abnormal   Collection Time: 06/11/22 10:54 AM   Specimen: BLOOD  Result Value Ref Range Status   Specimen Description   Final    BLOOD Blood Culture adequate volume Performed at [facility], [address] [place], [place] [address]    Special Requests   Final    BOTTLES DRAWN AEROBIC AND ANAEROBIC BLOOD LEFT HAND Performed at [facility], [address], [place], [place] [address]    Culture  Setup Time   Final    BUDDING YEAST SEEN IN BOTH AEROBIC AND ANAEROBIC BOTTLES CRITICAL VALUE NOTED.  VALUE IS CONSISTENT WITH PREVIOUSLY REPORTED AND CALLED VALUE. Performed at [facility], [address] [place], [place] [address]    Culture CRYPTOCOCCUS NEOFORMANS (A)  Final   Report Status 06/16/2022 FINAL  Final  Blood Culture ID Panel (Reflexed)     Status: Abnormal   Collection Time: 06/11/22 10:54 AM  Result Value Ref Range Status   Enterococcus faecalis NOT DETECTED NOT DETECTED Final   Enterococcus Faecium NOT DETECTED NOT DETECTED Final   Listeria monocytogenes NOT DETECTED NOT DETECTED Final   Staphylococcus species NOT DETECTED NOT DETECTED Final   Staphylococcus aureus (BCID) NOT DETECTED NOT DETECTED Final   Staphylococcus epidermidis NOT DETECTED NOT DETECTED Final   Staphylococcus lugdunensis NOT DETECTED NOT DETECTED Final   Streptococcus species NOT DETECTED NOT DETECTED Final   Streptococcus agalactiae NOT DETECTED NOT DETECTED Final   Streptococcus pneumoniae NOT DETECTED NOT DETECTED Final   Streptococcus pyogenes NOT DETECTED NOT DETECTED Final   A.calcoaceticus-baumannii NOT DETECTED NOT DETECTED Final   Bacteroides fragilis NOT DETECTED NOT DETECTED Final    Enterobacterales NOT DETECTED NOT DETECTED Final   Enterobacter cloacae complex NOT DETECTED NOT DETECTED Final   Escherichia coli NOT DETECTED NOT DETECTED Final   Klebsiella aerogenes NOT DETECTED NOT DETECTED Final   Klebsiella oxytoca NOT DETECTED NOT DETECTED Final   Klebsiella pneumoniae NOT DETECTED NOT DETECTED Final   Proteus species NOT DETECTED NOT DETECTED Final   Salmonella species NOT DETECTED NOT DETECTED Final   Serratia marcescens NOT DETECTED NOT DETECTED Final   Haemophilus influenzae NOT DETECTED NOT DETECTED Final   Neisseria meningitidis NOT DETECTED NOT DETECTED Final   Pseudomonas aeruginosa NOT DETECTED NOT DETECTED Final   Stenotrophomonas maltophilia NOT DETECTED NOT DETECTED Final  Candida albicans NOT DETECTED NOT DETECTED Final   Candida auris NOT DETECTED NOT DETECTED Final   Candida glabrata NOT DETECTED NOT DETECTED Final   Candida krusei NOT DETECTED NOT DETECTED Final   Candida parapsilosis NOT DETECTED NOT DETECTED Final   Candida tropicalis NOT DETECTED NOT DETECTED Final   Cryptococcus neoformans/gattii DETECTED (A) NOT DETECTED Final    Comment: CRITICAL RESULT CALLED TO, READ BACK BY AND VERIFIED WITH: CAROLYN COULTER ON 06/14/22 AT [id number] QSD Performed at [facility], [address] [place], [place] [address]   CSF culture w Gram Stain     Status: None   Collection Time: 06/13/22 10:44 AM   Specimen: CSF; Cerebrospinal Fluid  Result Value Ref Range Status   Specimen Description   Final    CSF Performed at [facility], [address], [place], [place] [address]    Special Requests   Final    NONE Performed at [facility], [address] [place], [place] [address]    Gram Stain   Final    WBC PRESENT,BOTH PMN AND MONONUCLEAR ENCAPSULATED YEAST CYTOSPIN SMEAR PREVIOUSLY REPORTED AS: NO ORGANISMS SEEN CORRECTED RESULTS CALLED TO: RN LUANNE HUMPHRIES ON 06/13/22 @ 2225 BY DRT Performed at [place], [place] [address], [place], [place] [address]    Culture RARE CRYPTOCOCCUS NEOFORMANS  Final   Report Status 06/16/2022 FINAL  Final  Culture, fungus without smear     Status: None (Preliminary result)   Collection Time: 06/13/22 10:45 AM   Specimen: CSF; Cerebrospinal Fluid  Result Value Ref Range Status   Specimen Description   Final    CSF Performed at [facility], [address], [place], [place] [address]    Special Requests   Final    NONE Performed at [facility], [address] [place], [place] [address]    Culture YEAST  Final   Report Status PENDING  Incomplete  Anaerobic culture w Gram Stain     Status: None (Preliminary result)   Collection Time: 06/13/22 10:45 AM   Specimen: CSF; Cerebrospinal Fluid  Result Value Ref Range Status   Specimen Description   Final    CSF Performed at [facility], [address], [place], [place] [address]    Special Requests   Final    NONE Performed at [facility], [address] [place], [place] [address]    Gram Stain   Final    WBC PRESENT, PREDOMINANTLY PMN ENCAPSULATED YEAST CRITICAL VALUE NOTED.  VALUE IS CONSISTENT WITH PREVIOUSLY REPORTED AND CALLED VALUE. Performed at [facility], [place] [address], [place], [place] [address]    Culture   Final    NO ANAEROBES ISOLATED; CULTURE IN PROGRESS FOR 5 DAYS   Report Status PENDING  Incomplete  Culture, blood (Routine X 2) w Reflex to ID Panel     Status: None (Preliminary result)   Collection Time: 06/15/22  4:05 AM   Specimen: BLOOD  Result Value Ref Range Status   Specimen Description   Final    BLOOD BLOOD RIGHT ARM Performed at [facility], [address], [place], [place] [address]    Special Requests   Final    BOTTLES DRAWN AEROBIC AND ANAEROBIC Blood Culture results may not be optimal due to an inadequate volume of blood received in culture bottles Performed at [facility], [address],  [place],  [address]    Culture  Final    NO GROWTH 1 DAY Performed at [facility], [place] [address], [place], [place] [address]    Report Status PENDING  Incomplete  Culture, blood (Routine X 2) w Reflex to ID Panel     Status: None (Preliminary result)   Collection Time: 06/15/22  4:14 AM   Specimen: BLOOD  Result Value Ref Range Status   Specimen Description   Final    BLOOD BLOOD RIGHT HAND Performed at [facility], [address] [place], [place] [address]    Special Requests   Final    BOTTLES DRAWN AEROBIC AND ANAEROBIC Blood Culture results may not be optimal due to an excessive volume of blood received in culture bottles Performed at [facility], [address], [place], [place] [address]    Culture   Final    NO GROWTH 1 DAY Performed at [facility], [place] [address], [place], [place] [address]    Report Status PENDING  Incomplete  Gastrointestinal Panel by PCR , Stool     Status: Abnormal   Collection Time: 06/15/22  5:36 PM   Specimen: Stool  Result Value Ref Range Status   Campylobacter species NOT DETECTED NOT DETECTED Final   Plesimonas shigelloides NOT DETECTED NOT DETECTED Final   Salmonella species NOT DETECTED NOT DETECTED Final   Yersinia enterocolitica DETECTED (A) NOT DETECTED Final    Comment: CRITICAL RESULT CALLED TO, READ BACK BY AND VERIFIED WITH: LUANN HUMPRIES 06/15/2022 AT 1930 SRR    Vibrio species NOT DETECTED NOT DETECTED Final   Vibrio cholerae NOT DETECTED NOT DETECTED Final   Enteroaggregative E coli (EAEC) NOT DETECTED NOT DETECTED Final   Enteropathogenic E coli (EPEC) NOT DETECTED NOT DETECTED Final   Enterotoxigenic E coli (ETEC) NOT DETECTED NOT DETECTED Final   Shiga like toxin producing E coli (STEC) NOT DETECTED NOT DETECTED Final   Shigella/Enteroinvasive E coli (EIEC) NOT DETECTED NOT DETECTED Final   Cryptosporidium NOT DETECTED NOT DETECTED Final   Cyclospora cayetanensis NOT DETECTED NOT  DETECTED Final   Entamoeba histolytica NOT DETECTED NOT DETECTED Final   Giardia lamblia NOT DETECTED NOT DETECTED Final   Adenovirus F40/41 NOT DETECTED NOT DETECTED Final   Astrovirus NOT DETECTED NOT DETECTED Final   Norovirus GI/GII NOT DETECTED NOT DETECTED Final   Rotavirus A NOT DETECTED NOT DETECTED Final   Sapovirus (I, II, IV, and V) NOT DETECTED NOT DETECTED Final    Comment: Performed at [facility], [address] [place], [place] [address]    IMAGING RESULTS: Ct head No lesions I have personally reviewed the films ? Impression/Recommendation cryptococcal meningitis High Crag > 2000 Cryptococcemia Continue amphotericin + flucytosis Follow closely electrolytes, cr Monitor serum crag Once he is more alert, a neurological examination could be thoroughly done and may be MRI , repeat LP VDRL csf neg HIV NR  Thrombocytopenia macrocytosis  AKI?  Hyperbilirubinemia worsening 11.7>>21  Transaminiits Hypoalbuminemia  HEPC  infection- low level viremia Pigmented verrucous lesion over mons pubis ? Condyloma due to HPV  ?? Seb wart  Will check RPR though does not look like condyloma latum ___________________________________________________ Discussed with care team Note:  This document was prepared using Dragon voice recognition software and may include unintentional dictation errors.

## 2022-06-16 NOTE — Progress Notes (Signed)
[facility] Progress Note  Patient: Johnny Kelley    [id number]  DOA: 06/13/2022     Date of Service: the patient was seen and examined on 06/16/2022  Chief Complaint  Patient presents with   Altered Mental Status   Brief hospital course: Johnny Kelley is a 57 y.o. male with medical history significant of hypertension, hyperlipidemia, diabetes mellitus, diastolic CHF, stroke, GERD, BPH, polysubstance abuse (alcohol, tobacco and cocaine), hepatocellular carcinoma (s/p of embolization treatment in [facility] per his brother), liver cirrhosis, who presents with altered mental status, fall and back pain. Per his brother (I called his brother by phone), patient has chronic back pain with abnormal MRI findings. Pt has an appointment with neurosurgeon, Dr. Cari Caraway on 3/14.  Due to back pain, patient has difficulty walking and decreased mobility.  It is very difficult for his brother to take care of him at home. Patient has been confused in the past several days. In the ED  patient was very lethargic, mildly confused, but still orientated x 3.  He did complain of mid and lower back pain, which is constant, severe, nonradiating.  He also has bilateral leg weakness.    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  CT head negative.  UDS negative. -Frequent neurocheck -Fall precaution -Lactulose 20 g 3 times daily via NG tube, s/p lactulose enema d/c'd  Patient had fever, empirically patient received ceftriaxone, vancomycin which has been discontinued after diagnosis of cryptococcal meningitis.  DC rifaximin as well. 3/15 herpetic lesion noticed on upper lip, may be viral encephalitis causing AMS Lumbar puncture ordered. S/p acyclovir IV was given which was discontinued after diagnosis of cryptococcal meningitis [place] abd was done for paracentesis but there was no fluid to  Tap, so could not r/o SBP 3/13 blood culture positive for Cryptococcus neoformans/gattii  3/15 CSF shows cryptococcal antigen titer 2560, CSF WBC 148-212, glucose 72, RBC 31, protein 195 3/15 NG tube was inserted for feeding, dietitian consulted. 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  AST 98, ALT 40, total bilirubin 11.7.  Patient continues to drink alcohol. -Cautiously use Tylenol -Patient will need counseling about importance of quitting alcohol drinking Elevated bilirubin   Fall at home, initial encounter: CT head and neck negative. -Fall precaution -PT/OT   Hyperkalemia: Potassium 5.3 S/p Leukoma 5 g and s/p 40 mg Lasix due to elevated BNP K 3.5   Hypokalemia, potassium 3.2 on 3/15 Potassium was repleted. Hypophosphatemia, Phos repleted. Monitor electrolytes and replete as needed.  HTN (hypertension) D/c'd Amlodipine, HCTZ, metoprolol, due to hypotension -Hold losartan due to hyperkalemia S/p NS 500 mL bolus given 3/16 started midodrine 10 mg p.o. 3 times daily  3/15 SVT 6 followed by 16 beats, patient was unable to offer any complaint due to encephalopathy.  Lopressor 5 mg IV 1 dose given. mg 1.9 wnl 3/16 Started metoprolol 5 mg IV 3 times daily We will continue to monitor BP and  titrate medications accordingly    History of CVA (cerebrovascular accident) -Patient is not taking Plavix due to easy bleeding   Chronic diastolic  CHF (congestive heart failure) ([place]): 2D echo on 06/24/2021 showed EF of 60 to 65% with grade 1 diastolic dysfunction.  BNP is elevated 418 and chest x-ray with vascular crowding, but patient only has trace leg edema, no JVT, no shortness of breath or oxygen desaturation.  Does not seem to have CHF exacerbation.  Patient is at risk of developing CHF exacerbation -s/p 1 dose of Lasix to 40 mg x 1  -Held HCTZ due to low blood pressure   Type 2 diabetes mellitus with diabetic neuropathy, without long-term current use of insulin ([place]): Recent A1c 5.7, well-controlled.  Patient taking Trulicity, metformin and Jardiance. -Sliding scale insulin 3/15 increased Semglee 22 units daily  BPH with obstruction/lower urinary tract symptoms -Resume Flomax when able to take oral medications   Thrombocytopenia ([place]): this is a chronic issue due to liver cirrhosis. Plts 38--48--46--61 -Follow-up CBC   Hyperbilirubinemia secondary to sepsis AST 131 elevated, ALT 54  Total bilirubin 21, DB 12 and IB 9.2 Monitor LFTs Follow ultrasound liver/EGD  Alcohol abuse and Tobacco abuse -Did counseling about importance of quitting alcohol use and tobacco use -CIWA protocol -Nicotine patch   Back pain: Patient has chronic mid and lower back pain, with abnormal MRI findings as above.  Patient has an appointment with Dr. Cari Caraway of neurosurgery on 3/14 -Consulted Dr. Cari Caraway for neurosurgery, recommended no intervention until his mental status improves.   Body mass index is 24.48 kg/m.  Interventions:    Diet: NG tube feeding DVT Prophylaxis: SCD, pharmacological prophylaxis contraindicated due to thrombocytopenia and bleeding    Advance goals of care discussion: DNR  Family Communication: family was not present at bedside, at the time of interview.  The pt provided permission to discuss medical plan with the family. Opportunity was given to ask question and all questions were answered satisfactorily.   3/16 d/w patient's brother over the phone  Disposition:  Pt is from Home, admitted with AMS, found to have cryptococcal meningitis, on IV antifungal medication, ID consulted, which precludes a safe discharge. Discharge to SNF, when clinically stable.  Subjective: No significant overnight events, patient's mental status is gradually improving, he is trying to talk, mumbling and difficult to understand.  Unable to offer any complaints.    Physical Exam: General: NAD, lying comfortably Appear in no distress Eyes: closed, spontaneously opening briefly ENT: Oral Mucosa Clear, dry,  Neck: no JVD,  Cardiovascular: S1 and S2 Present, no Murmur,  Respiratory: good respiratory effort, Bilateral Air entry equal and Decreased, no Crackles, no wheezes Abdomen: Bowel Sound present, Soft and no tenderness, genital warts Skin: no rashes Extremities: no Pedal edema, no calf tenderness Neurologic: Encephalopathic, without any new focal findings Gait not checked due to patient safety concerns  Vitals:   06/16/22 0438 06/16/22 0500 06/16/22 0806 06/16/22 1116  BP: 119/67  (!) 104/56 131/75  Pulse: 100  (!) 104 86  Resp: 18  16 20   Temp: 98 F (36.7 C)  98 F (36.7 C) (!) 97.5 F (36.4 C)  TempSrc:    Oral  SpO2: 96%  95% 94%  Weight:  90.2 kg    Height:        Intake/Output Summary (Last 24 hours) at 06/16/2022 1353 Last data filed at 06/16/2022 1147 Gross per 24 hour  Intake 3055.17 ml  Output --  Net 3055.17 ml  Filed Weights   06/14/22 0500 06/15/22 0500 06/16/22 0500  Weight: 86.4 kg 86.6 kg 90.2 kg    Data Reviewed: I have personally reviewed and interpreted daily labs, tele strips, imagings as discussed above. I reviewed all nursing notes, pharmacy notes, vitals, pertinent old records I have discussed plan of care as described above with RN and patient/family.  CBC: Recent Labs  Lab 06/19/2022 1132 06/11/22 0718 06/12/22 0627 06/13/22 0504 06/14/22 0447 06/15/22 0405  06/16/22 0542  WBC 6.1   < > 7.2 6.4 7.9 8.0 6.8  NEUTROABS 3.9  --   --   --   --   --   --   HGB 14.4   < > 13.3 13.8 12.4* 11.9* 13.1  HCT 40.5   < > 37.6* 39.4 35.6* 34.0* 37.9*  MCV 102.5*   < > 102.7* 103.1* 104.7* 104.0* 103.8*  PLT 36*   < > 38* 48* 46* 57* 61*   < > = values in this interval not displayed.   Basic Metabolic Panel: Recent Labs  Lab 06/12/22 0627 06/13/22 0504 06/13/22 2100 06/14/22 0447 06/15/22 0405 06/16/22 0542  NA 133* 137 137 133* 134* 134*  K 3.6 3.2* 3.2* 3.7 3.5 3.6  CL 101 103 103 105 108 106  CO2 23 23 24  21* 20* 18*  GLUCOSE 229* 207* 174* 241* 180* 205*  BUN 28* 38* 37* 38* 47* 58*  CREATININE 0.93 1.08 0.92 0.73 1.62* 1.78*  CALCIUM 8.9 8.7* 8.7* 8.3* 8.0* 8.3*  MG 1.9 1.9 1.9 2.2 2.1 2.2  PHOS 3.7 3.5  --  2.2* 3.9 3.7    Studies: No results found.  Scheduled Meds:  Chlorhexidine Gluconate Cloth  6 each Topical Q0600   dextrose  10 mL Intravenous Q24H   dextrose  10 mL Intravenous Q24H   flucytosine  25 mg/kg Per Tube 99991111   folic acid  1 mg Per Tube Daily   free water  30 mL Per Tube Q4H   insulin aspart  0-5 Units Subcutaneous QHS   insulin aspart  0-9 Units Subcutaneous TID WC   insulin glargine-yfgn  22 Units Subcutaneous Daily   lactulose  20 g Per Tube TID   lidocaine  1 patch Transdermal Q24H   metoprolol tartrate  5 mg Intravenous TID   midodrine  10 mg Per NG tube TID WC   multivitamin  15 mL Per Tube Daily   mupirocin ointment  1 Application Nasal BID   nicotine  21 mg Transdermal Daily   pantoprazole (PROTONIX) IV  40 mg Intravenous Q24H   phenol  1 spray Mouth/Throat TID   sodium chloride  500 mL Intravenous Q24H   sodium chloride  500 mL Intravenous Q24H   thiamine  100 mg Per Tube Daily   Or   thiamine  100 mg Intravenous Daily   Continuous Infusions:  amphotericin B liposome (AMBISOME) 260 mg in dextrose 5 % 500 mL IVPB Stopped (06/16/22 0123)   chlorproMAZINE (THORAZINE) 12.5 mg in sodium chloride 0.9 %  25 mL IVPB 50 mL/hr at 06/16/22 1147   ciprofloxacin 200 mL/hr at 06/16/22 1147   feeding supplement (GLUCERNA 1.5 CAL) Stopped (06/16/22 1030)   PRN Meds: acetaminophen, acetaminophen, chlorproMAZINE (THORAZINE) 12.5 mg in sodium chloride 0.9 % 25 mL IVPB, diphenhydrAMINE **OR** diphenhydrAMINE, hydrALAZINE, LORazepam, meperidine (DEMEROL) injection, ondansetron (ZOFRAN) IV, oxyCODONE  Time spent: 50 minutes  Author: Val Riles. MD [facility] 06/16/2022 1:53 PM  To reach On-call, see care teams to  locate the attending and reach out to them via [email or web address]. If 7PM-7AM, please contact night-coverage If you still have difficulty reaching the attending provider, please page the [facility] (Director on Call) for [facility] on amion for assistance.

## 2022-06-16 NOTE — Progress Notes (Signed)
Nutrition Follow-up  DOCUMENTATION CODES:   Non-severe (moderate) malnutrition in context of chronic illness  INTERVENTION:   -Continue TF via NGT:   Glucerna 1.5 @ 30 ml/hr and increase by 10 ml every 12 hours to goal rate of 70 ml/hr.    30 ml free water flush every 4 hours   Tube feeding regimen provides 2520 kcal (100% of needs), 139 grams of protein, and 1275 ml of H2O. Total free water: 1455 ml daily   -Monitor Mg, K, and Phos daily and replete as needed due to high refeeding risk -100 mg thiamine IV daily (ordered by MD)  NUTRITION DIAGNOSIS:   Moderate Malnutrition related to chronic illness (cirrhosis) as evidenced by mild fat depletion, mild muscle depletion, moderate muscle depletion.  Ongoing  GOAL:   Patient will meet greater than or equal to 90% of their needs  Progressing   MONITOR:   PO intake, Supplement acceptance, TF tolerance  REASON FOR ASSESSMENT:   Consult Enteral/tube feeding initiation and management  ASSESSMENT:   Pt with medical history significant of hypertension, hyperlipidemia, diabetes mellitus, diastolic CHF, stroke, GERD, BPH, polysubstance abuse (alcohol, tobacco and cocaine), hepatocellular carcinoma (s/p of embolization treatment in [facility] per his brother), liver cirrhosis, who presents with altered mental status, fall and back pain.  3/15- NGT placed (14 french); KUB revealed tip of tube in the stomach with the side port near the gastroesophageal junction 3/16- NGT replaced (18 french) due to displacement; KUB reveals tip and side port below the diaphragm   Reviewed I/O's: +2.2 L since admission   Case discussed with RN. MD added IV fluids (lactated ringers @ 75 ml/hr) today. Pt currently receiving TF of Glucerna 1.5 @ 30 ml/hr with plans to advance to 40 ml/hr at 1330.   Pt lying in bed at time of visit. He did not respond to voice or touch. No family present.   No weight loss noted over the past week.   Medications reviewed  and include folic acid, lactulose, vitamin B-1, and thiamine  Labs reviewed: Na: 134, K, Mg, and Phos WDL. CBGS: 186-217 (inpatient orders for glycemic control are 0-5 units insulin aspart daily at bedtime, 0-9 units insulin aspart TID with meals, and 22 units insulin glargine-yfgn daily).    Diet Order:   Diet Order             Diet NPO time specified  Diet effective now                   EDUCATION NEEDS:   Not appropriate for education at this time  Skin:  Skin Assessment: Reviewed RN Assessment  Last BM:  06/13/22 (type 6)  Height:   Ht Readings from Last 1 Encounters:  06/21/2022 6\' 2"  (1.88 m)    Weight:   Wt Readings from Last 1 Encounters:  06/16/22 90.2 kg    Ideal Body Weight:  86.4 kg  BMI:  Body mass index is 25.53 kg/m.  Estimated Nutritional Needs:   Kcal:  2500-2700  Protein:  130-145 grams  Fluid:  > 2 L    Loistine Chance, RD, LDN, [place] Registered Dietitian II Certified Diabetes Care and Education Specialist Please refer to [facility] for RD and/or RD on-call/weekend/after hours pager

## 2022-06-16 NOTE — Consult Note (Signed)
Pharmacy Antibiotic Note  Johnny Kelley is a 57 y.o. male admitted on 06/21/2022 with confusion. PMH significant for HTN, HLD, DM, dCHF, stroke, GERD, BPH, polysubstance abuse (alcohol, tobacco and cocaine), hepatocellular carcinoma (s/p of embolization treatment in [facility] per his brother), liver cirrhosis. On 3/15 a herpetic lesion was noticed on his upper lip, and LP with CSF studies was ordered. ME panel positive for Cryptococcus neoformans/gatti. Blood cultures from 3/13 also positive for yeast and BCID deteted Cryptococcus neoformans. Pharmacy has been consulted for amphotericin B dosing.  Today, 06/16/2022 Day 3 antifungal therapy  Renal: SCR 1.78 from 1.62 3/17 (0.61 at baseline) K+ 3.6 Mg++ 2.2 WBC 6.8 Tm 99 LFTs: AST 131, ALT 54, Tbili 21 (trending up) Maintenance IVF: none (off LR) GI panel: yersinia enterolytica Repeat blood cultures 3/17 are NGTD  Plan: Continue Liposomal Amphotericin-B 260 mg IV daily (at 2200) mg/kg/day TBW) and Flucytosine 2250mg  (25 mg/kg) IV Q6H  Liposomal Amphotericin B 260 mg IV daily  Give initial 500 mL NS bolus followed by D5 flush then administer AmphoB infusion over 2h After completion of AmphoB infusion, give D5 flush then 500 mL NS bolus AmphoB not compatible with NS, requires D5 flush in between AmphoB and NS administration  NS bolus required for reduction in nephrotoxicity risk Supportive medications PRN: demerol (rigors), diphenhydramine and acetaminophen (infusion reaction) Flucytosine 2250 mg per tube daily No recommendations for hepatic adjustment for either medication.  Neither extensively metabolized by liver No electrolyte (potassium or magnesium) replacement necessary today  Ciprofloxacin 500mg  IV q12h for Yersinia Continue to monitor renal function closely, replace Mg and K as needed, and follow culture results   Height: 6\' 2"  (188 cm) Weight: 90.2 kg (198 lb 13.7 oz) IBW/kg (Calculated) : 82.2  Temp (24hrs), Avg:98.4 F (36.9  C), Min:97.9 F (36.6 C), Max:99 F (37.2 C)  Recent Labs  Lab 06/11/22 1333 06/11/22 1623 06/12/22 0627 06/13/22 0504 06/13/22 2100 06/14/22 0447 06/15/22 0405 06/16/22 0542  WBC  --   --  7.2 6.4  --  7.9 8.0 6.8  CREATININE  --   --  0.93 1.08 0.92 0.73 1.62* 1.78*  LATICACIDVEN 3.0* 2.7*  --   --   --   --   --   --      Estimated Creatinine Clearance: 52.6 mL/min (A) (by C-G formula based on SCr of 1.78 mg/dL (H)).    No Known Allergies  Antimicrobials this admission: 3/13 Vancomycin >> 3/15 3/13 Ceftriaxone >> 3/15 3/15 Acyclovir >> 3/15 3/14 Rifaximin >> 3/16 3/15 Amphotericin B >> 3/16 Flucytosine >> 3/18 ciprofloxacin >>   Dose adjustments this admission: 3/15 Vancomycin 1500 mg IV Q12H >> Vancomycin 1250 mg IV Q12H   Microbiology results: 3/13 BCx: yeast, BCID Cryptococcus neoformans/gattii 3/12 MRSA PCR: positive 3/15 CSF Cx: encapsulated yeast consistent with other findings 3/15 CSF VRDL: IP 3/15 ME Panel: Cryptococcus neoformans/gattii detected 3/17 BCx: NGTD  Thank you for allowing pharmacy to be a part of this patient's care.  Doreene Eland, PharmD, BCPS, BCIDP Work Cell: [phone] 06/16/2022 10:54 AM

## 2022-06-17 ENCOUNTER — Inpatient Hospital Stay: Payer: Medicaid Other

## 2022-06-17 ENCOUNTER — Encounter: Payer: Self-pay | Admitting: Podiatry

## 2022-06-17 DIAGNOSIS — K7682 Hepatic encephalopathy: Secondary | ICD-10-CM | POA: Diagnosis not present

## 2022-06-17 DIAGNOSIS — B451 Cerebral cryptococcosis: Secondary | ICD-10-CM | POA: Diagnosis not present

## 2022-06-17 DIAGNOSIS — K7460 Unspecified cirrhosis of liver: Secondary | ICD-10-CM | POA: Diagnosis not present

## 2022-06-17 DIAGNOSIS — D696 Thrombocytopenia, unspecified: Secondary | ICD-10-CM | POA: Diagnosis not present

## 2022-06-17 LAB — HEPATIC FUNCTION PANEL
ALT: 51 U/L — ABNORMAL HIGH (ref 0–44)
AST: 133 U/L — ABNORMAL HIGH (ref 15–41)
Albumin: 1.7 g/dL — ABNORMAL LOW (ref 3.5–5.0)
Alkaline Phosphatase: 154 U/L — ABNORMAL HIGH (ref 38–126)
Bilirubin, Direct: 14.3 mg/dL — ABNORMAL HIGH (ref 0.0–0.2)
Indirect Bilirubin: 9.8 mg/dL — ABNORMAL HIGH (ref 0.3–0.9)
Total Bilirubin: 24.1 mg/dL (ref 0.3–1.2)
Total Protein: 6.3 g/dL — ABNORMAL LOW (ref 6.5–8.1)

## 2022-06-17 LAB — CBC
HCT: 35.7 % — ABNORMAL LOW (ref 39.0–52.0)
Hemoglobin: 12.5 g/dL — ABNORMAL LOW (ref 13.0–17.0)
MCH: 36.8 pg — ABNORMAL HIGH (ref 26.0–34.0)
MCHC: 35 g/dL (ref 30.0–36.0)
MCV: 105 fL — ABNORMAL HIGH (ref 80.0–100.0)
Platelets: 73 10*3/uL — ABNORMAL LOW (ref 150–400)
RBC: 3.4 MIL/uL — ABNORMAL LOW (ref 4.22–5.81)
RDW: 17.9 % — ABNORMAL HIGH (ref 11.5–15.5)
WBC: 7.4 10*3/uL (ref 4.0–10.5)
nRBC: 0 % (ref 0.0–0.2)

## 2022-06-17 LAB — BASIC METABOLIC PANEL
Anion gap: 10 (ref 5–15)
BUN: 78 mg/dL — ABNORMAL HIGH (ref 6–20)
CO2: 19 mmol/L — ABNORMAL LOW (ref 22–32)
Calcium: 8.3 mg/dL — ABNORMAL LOW (ref 8.9–10.3)
Chloride: 107 mmol/L (ref 98–111)
Creatinine, Ser: 2.68 mg/dL — ABNORMAL HIGH (ref 0.61–1.24)
GFR, Estimated: 27 mL/min — ABNORMAL LOW (ref >60)
Glucose, Bld: 206 mg/dL — ABNORMAL HIGH (ref 70–99)
Potassium: 3.6 mmol/L (ref 3.5–5.1)
Sodium: 136 mmol/L (ref 135–145)

## 2022-06-17 LAB — GLUCOSE, CAPILLARY
Glucose-Capillary: 145 mg/dL — ABNORMAL HIGH (ref 70–99)
Glucose-Capillary: 150 mg/dL — ABNORMAL HIGH (ref 70–99)
Glucose-Capillary: 172 mg/dL — ABNORMAL HIGH (ref 70–99)
Glucose-Capillary: 177 mg/dL — ABNORMAL HIGH (ref 70–99)
Glucose-Capillary: 198 mg/dL — ABNORMAL HIGH (ref 70–99)
Glucose-Capillary: 211 mg/dL — ABNORMAL HIGH (ref 70–99)
Glucose-Capillary: 231 mg/dL — ABNORMAL HIGH (ref 70–99)

## 2022-06-17 LAB — MAGNESIUM: Magnesium: 2.4 mg/dL (ref 1.7–2.4)

## 2022-06-17 LAB — PHOSPHORUS: Phosphorus: 4.2 mg/dL (ref 2.5–4.6)

## 2022-06-17 MED ORDER — SODIUM CHLORIDE 0.9 % IV BOLUS
500.0000 mL | Freq: Once | INTRAVENOUS | Status: AC
  Administered 2022-06-17: 500 mL via INTRAVENOUS

## 2022-06-17 MED ORDER — TECHNETIUM TC 99M MEBROFENIN IV KIT
10.9300 | PACK | Freq: Once | INTRAVENOUS | Status: AC | PRN
  Administered 2022-06-17: 10.93 via INTRAVENOUS

## 2022-06-17 MED ORDER — HYDROMORPHONE HCL 1 MG/ML IJ SOLN
0.5000 mg | INTRAMUSCULAR | Status: DC | PRN
  Administered 2022-06-17: 0.5 mg via INTRAVENOUS
  Filled 2022-06-17 (×2): qty 1

## 2022-06-17 MED ORDER — MORPHINE SULFATE (PF) 2 MG/ML IV SOLN: 2.0000 mg | INTRAVENOUS | Status: DC | PRN

## 2022-06-17 MED ORDER — ALBUMIN HUMAN 25 % IV SOLN
25.0000 g | Freq: Three times a day (TID) | INTRAVENOUS | Status: DC
  Administered 2022-06-17 – 2022-06-18 (×3): 25 g via INTRAVENOUS
  Filled 2022-06-17 (×5): qty 100

## 2022-06-17 MED ORDER — FLUCYTOSINE 500 MG PO CAPS
25.0000 mg/kg | ORAL_CAPSULE | Freq: Two times a day (BID) | ORAL | Status: DC
  Administered 2022-06-17: 2250 mg
  Filled 2022-06-17 (×3): qty 1

## 2022-06-17 MED ORDER — SODIUM BICARBONATE 8.4 % IV SOLN
INTRAVENOUS | Status: DC
  Filled 2022-06-17 (×2): qty 1000
  Filled 2022-06-17 (×2): qty 150

## 2022-06-17 MED ORDER — ZINC OXIDE 40 % EX OINT
TOPICAL_OINTMENT | CUTANEOUS | Status: DC | PRN
  Filled 2022-06-17: qty 113

## 2022-06-17 NOTE — Progress Notes (Signed)
Call placed to lab to ask to come back to re-attempt labs. MD made aware

## 2022-06-17 NOTE — Progress Notes (Signed)
SLP Cancellation Note  Patient Details Name: Johnny Kelley MRN: [id number] DOB: December 30, 1965   Cancelled treatment:       Reason Eval/Treat Not Completed: Fatigue/lethargy limiting ability to participate (chart reviewed; Noted pt's presentation w/ other disciplines/staff.  Pt is also off the floor to IR.  ST services will f/u for BSE. Recommend oral care fo rhygiene and stimulation of swallowing.     Orinda Kenner, MS, CCC-SLP Speech Language Pathologist Rehab Services; [place] [phone] (ascom) [name] 06/17/2022, 3:41 PM

## 2022-06-17 NOTE — Progress Notes (Signed)
Tube feeding resumed. HOB greater than 30 degrees

## 2022-06-17 NOTE — Progress Notes (Addendum)
PT Cancellation Note  Patient Details Name: Johnny Kelley MRN: [id number] DOB: [birth year]   Cancelled Treatment:    Reason Eval/Treat Not Completed: Patient's level of consciousness;Fatigue/lethargy limiting ability to participate;Medical issues which prohibited therapy Spoke with nurse who reports that pt had a rough night with repeated BMs, rectal tube placed today.  RN reports that pt still lethargic and confused and is scheduled for further workup testing.  Pt not appropriate for PT today; will maintain on caseload and attempt when appropriate.    Kreg Shropshire, DPT 06/17/2022, 2:08 PM

## 2022-06-17 NOTE — Progress Notes (Signed)
Patient has had 6 large liquid BMs during night. Lotrimin & barrier cream has been applied. Perineal/buttocks is excoriated, oozing, bleeding. C/o pain and discomfort during perineal care. Patient restless, attempting to remove catheter & other tubing, in spite of mitts in place. Frequent care provided by staff. Reoriented. Patient repeatedly requesting for tubes & lines to be removed so that he can leave facility.

## 2022-06-17 NOTE — Progress Notes (Signed)
Date of Admission:  06/07/2022     ID: Johnny Kelley is a 57 y.o. male  Principal Problem:   Cryptococcal meningitis ([place]) Active Problems:   History of CVA (cerebrovascular accident)   Alcohol abuse   Type 2 diabetes mellitus with diabetic neuropathy, without long-term current use of insulin (HCC)   BPH with obstruction/lower urinary tract symptoms   Hepatic encephalopathy (HCC)   Tobacco abuse   HTN (hypertension)   Fall at home, initial encounter   Thrombocytopenia ([place])   Hyperkalemia   Chronic diastolic CHF (congestive heart failure) (HCC)   Cirrhosis (HCC)   Back pain   Spinal stenosis of lumbar region   Malnutrition of moderate degree  Patient is lethargic and somnolent does not verbally respond to any questions    Medications:   Chlorhexidine Gluconate Cloth  6 each Topical Q0600   clotrimazole   Topical BID   dextrose  10 mL Intravenous Q24H   dextrose  10 mL Intravenous Q24H   flucytosine  25 mg/kg Per Tube [id number]   folic acid  1 mg Per Tube Daily   free water  30 mL Per Tube Q4H   insulin aspart  0-5 Units Subcutaneous QHS   insulin aspart  0-9 Units Subcutaneous TID WC   insulin glargine-yfgn  22 Units Subcutaneous Daily   lactulose  20 g Per Tube TID   lidocaine  1 patch Transdermal Q24H   midodrine  10 mg Per NG tube TID WC   multivitamin  15 mL Per Tube Daily   nicotine  21 mg Transdermal Daily   pantoprazole (PROTONIX) IV  40 mg Intravenous Q24H   phenol  1 spray Mouth/Throat TID   sodium chloride  500 mL Intravenous Q24H   sodium chloride  500 mL Intravenous Q24H   thiamine  100 mg Per Tube Daily   Or   thiamine  100 mg Intravenous Daily    Objective: Vital signs in last 24 hours: Patient Vitals for the past 24 hrs:  BP Temp Pulse Resp SpO2  06/17/22 2006 (!) 93/49 -- 75 18 94 %  06/17/22 1653 98/60 -- 69 18 --  06/17/22 1546 (!) 78/44 -- 70 12 93 %  06/17/22 1206 (!) 92/50 -- 74 -- --  06/17/22 1146 (!) 75/52 -- 71 -- --  06/17/22  1144 (!) 80/50 98.7 F (37.1 C) 72 18 96 %  06/17/22 0805 (!) 114/58 98.5 F (36.9 C) 86 16 94 %  06/17/22 0443 (!) 107/58 98.9 F (37.2 C) 85 18 96 %  06/17/22 0013 103/60 98.3 F (36.8 C) 78 20 98 %  06/16/22 2041 (!) 101/59 98.9 F (37.2 C) 85 18 97 %    LDA NG tube  PHYSICAL EXAM:  General: Somnolent does not follow commands head: Normocephalic, without obvious abnormality, atraumatic. Eyes: Icteric   Lungs: Bilateral air entry  heart: Regular rate and rhythm, no murmur, rub or gallop. Abdomen: Soft, non-tender,not distended. Bowel sounds normal. No masses Extremities: fading bruises Skin:  Lymph: Cervical, supraclavicular normal. Neurologic: Cannot  examine in detail Lab Results Recent Labs    06/16/22 0542 06/17/22 0926  WBC 6.8 7.4  HGB 13.1 12.5*  HCT 37.9* 35.7*  NA 134* 136  K 3.6 3.6  CL 106 107  CO2 18* 19*  BUN 58* 78*  CREATININE 1.78* 2.68*   Liver Panel Recent Labs    06/16/22 0542 06/17/22 0926  PROT 6.5 6.3*  ALBUMIN 1.8* 1.7*  AST 131* 133*  ALT 54* 51*  ALKPHOS 143* 154*  BILITOT 21.0* 24.1*  BILIDIR 12.0* 14.3*  IBILI 9.2* 9.8*  Microbiology: 06/11/2022 blood cultures Cryptococcus neoformans/ CSF 06/13/2022 cryptococcus  06/15/2022 blood culture no growth so far Studies/Results: US Abdomen Limited RUQ (LIVER/GB)  Result Date: 06/16/2022 CLINICAL DATA:  Hyperbilirubinemia EXAM: ULTRASOUND ABDOMEN LIMITED RIGHT UPPER QUADRANT COMPARISON:  06/13/2022 FINDINGS: Gallbladder: Gallstones: Mild cholelithiasis Sludge: None Gallbladder Wall: Mild gallbladder wall thickening measuring up to 5 mm Pericholecystic fluid: None Sonographic Murphy's Sign: Negative per technologist Common bile duct: Diameter: 2 mm Liver: Parenchymal echogenicity: Diffusely heterogeneous echotexture Contours: Nodular Lesions: None Portal vein: Patent.  Hepatopetal flow Other: Minimal perihepatic ascites. IMPRESSION: 1.  Cirrhotic liver morphology without focal hepatic  lesion. 2. Mild cholelithiasis. Gallbladder wall thickening may be due to chronic cholecystitis or hepatitis. If there is high clinical concern for cholecystitis, further evaluation with HIDA scan should be performed. Electronically Signed   By: Miachel Roux M.D.   On: 06/16/2022 16:00     Assessment/Plan: 57 year old male with history of hepatocellular carcinoma, cirrhosis, alcohol use, presents with falls and altered mental status  Cryptococcal meningitis with high cryptococcal antigen titer in the CSF Also has cryptococcemia  Currently on Amphotericin and flucytosine  Once he is more alert and neurological examination should be done and maybe an MRI brain and lumbo/sacral spine  be obtained. VDRL CSF not negative HIV nonreactive   AKI worsening creatinine secondary to prerenal because of inadequate hydration yesterday and also getting Amphotericin Nephrology following him from today.  Started on bicarb drip   Yersinia enterocolitica gastroenteritis.  Currently on cipro  recommend  CT abdomen to look for any intestinal lesions or liver abscesses or thrombosis as risk for complication from Yersinia and with bilirubin worsening..   Thrombocytopenia  Hyperbilirubinemia worsening, recommend  CT abdomen to look for any intestinal lesions or liver abscesses or vein thrombosis as risk for complication from Yersinia   Hep C low-level viremia Cirrhosis liver  Pigmented verrucous lesions in the mons pubic area.  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

## 2022-06-17 NOTE — Progress Notes (Signed)
Patient moved to low air loss bed for prevention of skin breakdown. Prevalon boots in place bilaterally

## 2022-06-17 NOTE — Progress Notes (Signed)
Patient transferred to interventional radiology for hiatal scan in stable condition in bed with transfer.

## 2022-06-17 NOTE — Consult Note (Signed)
[facility]  CONSULT NOTE    Date: 06/17/2022                  Patient Name:  Johnny Kelley  MRN: [id number]  DOB: [birth year]  Age / Sex: 57 y.o., male         PCP: Venita Lick, NP                 Service Requesting Consult: [place]                 Reason for Consult: Acute kidney injury            History of Present Illness: Mr. Johnny Kelley is a 57 y.o.  male with past medical conditions including hyperlipidemia, diabetes, hypertension, GERD, BPH, stroke, polysubstance abuse, diastolic heart failure, liver cirrhosis and hepatocellular carcinoma, who was admitted to [facility] on 06/02/2022 for Hepatic encephalopathy ([place]) [K76.82] Hepatic cirrhosis, unspecified hepatic cirrhosis type, unspecified whether ascites present [facility]) [K74.60]  Patient presented to the emergency department with complaints of altered mental status, back pain and fall.  Patient is seen and evaluated at bedside.  Patient currently alert and very slow to respond to questioning.  Chart review used to obtain history.  Patient brother was called on ED arrival.  He reported patient's confusion for the past several days.  Patient currently lives with stepbrother who states that is difficult to care for him at home.  Patient presented to the ED lethargic with confusion.  Complains of chronic lower back pain.  Also complains of lower extremity weakness.  At presentation, denies shortness of breath, cough, slurred speech or facial droop, diarrhea, vomiting, or abdominal pain.  Patient did report some nausea.  It is noted that patient continues to drink alcohol.  On ED arrival, patient noted to have normal renal function with sodium 133, glucose 278, BUN 21.  It appears renal function began to deteriorate a couple days ago, creatinine 1.62 with GFR 49.  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  No IV contrast or surgical procedures noted.   Medications: Outpatient  medications: Medications Prior to Admission  Medication Sig Dispense Refill Last Dose   amLODipine (NORVASC) 2.5 MG tablet Take 1 tablet (2.5 mg total) by mouth daily. 30 tablet 1 06/09/2022   clotrimazole (LOTRIMIN) 1 % cream clotrimazole 1 % topical cream   unk   Dulaglutide (TRULICITY) A999333 0000000 SOPN Inject 0.75 mg into the skin once a week. On Monday   06/09/2022   Dulaglutide (TRULICITY) 1.5 0000000 SOPN Inject 1.5 mg into the skin once a week. 6 mL 4 Past Month   empagliflozin (JARDIANCE) 25 MG TABS tablet Take 25 mg by mouth daily.   Past Week   famotidine (PEPCID) 20 MG tablet TAKE 1 TABLET BY MOUTH EVERYDAY AT BEDTIME 90 tablet 1 06/09/2022   gabapentin (NEURONTIN) 100 MG capsule Take 1 capsule (100 mg total) by mouth daily. 90 capsule 3 06/09/2022   hydrochlorothiazide (HYDRODIURIL) 25 MG tablet Take 1 tablet (25 mg total) by mouth daily. 90 tablet 4 06/09/2022   lidocaine (LIDODERM) 5 % Place 1 patch onto the skin daily. Remove & Discard patch within 12 hours or as directed by MD 30 patch 3 Past Week   losartan (COZAAR) 100 MG tablet Take 1 tablet (100 mg total) by mouth daily. 90 tablet 0 06/09/2022   meloxicam (MOBIC) 7.5 MG tablet Take 7.5 mg by mouth daily.   Past Week  metFORMIN (GLUCOPHAGE) 1000 MG tablet TAKE 1 TABLET BY MOUTH EVERY DAY 90 tablet 0 06/09/2022   metoprolol succinate (TOPROL XL) 25 MG 24 hr tablet Take 1 tablet (25 mg total) by mouth daily. 30 tablet 5 06/09/2022   terbinafine (LAMISIL) 250 MG tablet Take 1 tablet (250 mg total) by mouth daily. 30 tablet 0 unk   tiZANidine (ZANAFLEX) 4 MG tablet Take 4 mg by mouth every 6 (six) hours as needed.   unk   blood glucose meter kit and supplies KIT Dispense based on patient and insurance preference. Use up to four times daily as directed. (FOR ICD-9 250.00, 250.01). 1 each 0    clopidogrel (PLAVIX) 75 MG tablet clopidogrel 75 mg tablet  TAKE 1 TABLET BY MOUTH EVERY DAY (Patient not taking: Reported on 06/28/2022)   Not  Taking   tamsulosin (FLOMAX) 0.4 MG CAPS capsule Take 1 capsule (0.4 mg total) by mouth daily. (Patient not taking: Reported on 05/30/2022) 30 capsule 11 Not Taking   TOVIAZ 8 MG TB24 tablet Take 8 mg by mouth daily. (Patient not taking: Reported on 06/15/2022)   Not Taking    Current medications: Current Facility-Administered Medications  Medication Dose Route Frequency Provider Last Rate Last Admin   acetaminophen (TYLENOL) tablet 500 mg  500 mg Per Tube Q6H PRN Val Riles, MD       acetaminophen (TYLENOL) tablet 650 mg  650 mg Per Tube Daily PRN Val Riles, MD       amphotericin B liposome (AMBISOME) 260 mg in dextrose 5 % 500 mL IVPB  3 mg/kg Intravenous Daily Val Riles, MD 282.5 mL/hr at 06/17/22 0225 260 mg at 06/17/22 0225   Chlorhexidine Gluconate Cloth 2 % PADS 6 each  6 each Topical Q0600 Val Riles, MD   6 each at 06/17/22 0829   chlorproMAZINE (THORAZINE) 12.5 mg in sodium chloride 0.9 % 25 mL IVPB  12.5 mg Intravenous Q8H PRN Ivor Costa, MD 50 mL/hr at 06/16/22 1147 Infusion Verify at 06/16/22 1147   ciprofloxacin (CIPRO) IVPB 400 mg  400 mg Intravenous Q12H Val Riles, MD 200 mL/hr at 06/17/22 1137 400 mg at 06/17/22 1137   clotrimazole (LOTRIMIN) 1 % cream   Topical BID Val Riles, MD   Given at 06/17/22 0803   dextrose 5 % 10 mL  10 mL Intravenous Q24H Val Riles, MD   10 mL at 06/16/22 2327   dextrose 5 % 10 mL  10 mL Intravenous Q24H Val Riles, MD   10 mL at 06/17/22 0708   diphenhydrAMINE (BENADRYL) injection 25 mg  25 mg Intravenous Daily PRN Val Riles, MD   25 mg at 06/17/22 0146   Or   diphenhydrAMINE (BENADRYL) capsule 25 mg  25 mg Oral Daily PRN Val Riles, MD       feeding supplement (GLUCERNA 1.5 CAL) liquid 1,000 mL  1,000 mL Per Tube Continuous Val Riles, MD 70 mL/hr at 06/17/22 0700 Rate Change at 06/17/22 0700   flucytosine (ANCOBON) capsule 2,250 mg  25 mg/kg Per Tube Q6H Alison Murray, RPH   2,250 mg at [id number]   folic  acid (FOLVITE) tablet 1 mg  1 mg Per Tube Daily Coulter, Hoyle Sauer, RPH   1 mg at 06/16/22 0845   free water 30 mL  30 mL Per Tube Q4H Val Riles, MD   30 mL at 06/17/22 0800   hydrALAZINE (APRESOLINE) injection 5 mg  5 mg Intravenous Q2H PRN Ivor Costa, MD  HYDROmorphone (DILAUDID) injection 0.5 mg  0.5 mg Intravenous Q4H PRN Val Riles, MD       insulin aspart (novoLOG) injection 0-5 Units  0-5 Units Subcutaneous QHS Ivor Costa, MD   2 Units at 06/11/22 2103   insulin aspart (novoLOG) injection 0-9 Units  0-9 Units Subcutaneous TID WC Ivor Costa, MD   1 Units at 06/16/22 1132   insulin glargine-yfgn (SEMGLEE) injection 22 Units  22 Units Subcutaneous Daily Val Riles, MD   22 Units at 06/17/22 1337   lactulose (Iatan) 10 GM/15ML solution 20 g  20 g Per Tube TID Val Riles, MD   20 g at 06/16/22 2326   lidocaine (LIDODERM) 5 % 1 patch  1 patch Transdermal Q24H Ivor Costa, MD   1 patch at 06/16/22 2322   liver oil-zinc oxide (DESITIN) 40 % ointment   Topical Q2H PRN Val Riles, MD       LORazepam (ATIVAN) injection 2 mg  2 mg Intravenous Q5 min PRN Val Riles, MD   2 mg at 06/13/22 1502   meperidine (DEMEROL) injection 25 mg  25 mg Intravenous Q15 min PRN Val Riles, MD       midodrine (PROAMATINE) tablet 10 mg  10 mg Per NG tube TID WC Val Riles, MD   10 mg at 06/16/22 1638   multivitamin liquid 15 mL  15 mL Per Tube Daily Coulter, Carolyn, RPH   15 mL at 06/15/22 0949   nicotine (NICODERM CQ - dosed in mg/24 hours) patch 21 mg  21 mg Transdermal Daily Ivor Costa, MD   21 mg at 06/17/22 1136   nystatin (MYCOSTATIN/NYSTOP) topical powder   Topical BID PRN Val Riles, MD       ondansetron [facility]) injection 4 mg  4 mg Intravenous Q8H PRN Ivor Costa, MD       oxyCODONE (Oxy IR/ROXICODONE) immediate release tablet 5 mg  5 mg Per Tube Q6H PRN Val Riles, MD   5 mg at 06/17/22 0657   pantoprazole (PROTONIX) injection 40 mg  40 mg Intravenous Q24H Val Riles, MD    40 mg at 06/17/22 1123   phenol (CHLORASEPTIC) mouth spray 1 spray  1 spray Mouth/Throat TID Val Riles, MD   1 spray at 06/16/22 2331   sodium bicarbonate 150 mEq in dextrose 5 % 1,150 mL infusion   Intravenous Continuous , , NP       sodium chloride 0.9 % bolus 500 mL  500 mL Intravenous Q24H Val Riles, MD   500 mL at 06/16/22 2330   sodium chloride 0.9 % bolus 500 mL  500 mL Intravenous Q24H Val Riles, MD   500 mL at 06/17/22 [id number]   sodium chloride 0.9 % bolus 500 mL  500 mL Intravenous Once Val Riles, MD       thiamine (VITAMIN B1) tablet 100 mg  100 mg Per Tube Daily Val Riles, MD       Or   thiamine (VITAMIN B1) injection 100 mg  100 mg Intravenous Daily Val Riles, MD   100 mg at 06/17/22 1124      Allergies: No Known Allergies    Past Medical History: Past Medical History:  Diagnosis Date   Cancer, hepatocellular ([place])    Diabetes mellitus without complication ([place])    Hepatitis C    Hypertension    Psoriasis    Stroke ([place])    Hx stroke in 2020   Substance abuse ([place])      Past Surgical History:  Past Surgical History:  Procedure Laterality Date   IR RADIOLOGIST EVAL & MGMT  01/19/2020   IR RADIOLOGIST EVAL & MGMT  01/31/2020   IR RADIOLOGIST EVAL & MGMT  04/12/2020   IR RADIOLOGIST EVAL & MGMT  08/30/2020   KNEE SURGERY Right      Family History: Family History  Problem Relation Age of Onset   Diabetes Mother    Colon cancer Father    Diabetes Father    Kidney disease Maternal Grandmother    Lung cancer Maternal Grandfather    Hyperlipidemia Paternal Grandmother    COPD Paternal Grandfather      Social History: Social History   Socioeconomic History   Marital status: Unknown    Spouse name: Not on file   Number of children: Not on file   Years of education: Not on file   Highest education level: Not on file  Occupational History   Occupation: unemployed  Tobacco Use   Smoking status: Every Day    Packs/day:  0.10    Years: 38.00    Additional pack years: 0.00    Total pack years: 3.80    Types: Cigarettes   Smokeless tobacco: Never  Vaping Use   Vaping Use: Never used  Substance and Sexual Activity   Alcohol use: Yes    Alcohol/week: 21.0 standard drinks of alcohol    Types: 21 Cans of beer per week    Comment: 2-3 beers a day   Drug use: Not Currently    Frequency: 1.0 times per week    Types: Marijuana, Cocaine    Comment: reports marijuana use once per week and cocaine use 1-2 monthper month   Sexual activity: Not Currently  Other Topics Concern   Not on file  Social History Narrative   Not on file   Social Determinants of Health   Financial Resource Strain: Not on file  Food Insecurity: No Food Insecurity (06/02/2022)   Hunger Vital Sign    Worried About Running Out of Food in the Last Year: Never true    Ran Out of Food in the Last Year: Never true  Transportation Needs: No Transportation Needs (06/25/2022)   PRAPARE - Hydrologist (Medical): No    Lack of Transportation (Non-Medical): No  Physical Activity: Not on file  Stress: Stress Concern Present (12/01/2019)   [place]    Feeling of Stress : To some extent  Social Connections: Socially Isolated (12/01/2019)   Social Connection and Isolation Panel [NHANES]    Frequency of Communication with Friends and Family: Never    Frequency of Social Gatherings with Friends and Family: More than three times a week    Attends Religious Services: Never    Marine scientist or Organizations: No    Attends Archivist Meetings: Never    Marital Status: Divorced  Human resources officer Violence: Not At Risk (06/15/2022)   Humiliation, Afraid, Rape, and Kick questionnaire    Fear of Current or Ex-Partner: No    Emotionally Abused: No    Physically Abused: No    Sexually Abused: No     Review of Systems: Review of Systems   Unable to perform ROS: Mental status change    Vital Signs: Blood pressure (!) 92/50, pulse 74, temperature 98.7 F (37.1 C), resp. rate 18, height 6\' 2"  (1.88 m), weight 90.2 kg, SpO2 96 %.  Weight trends: [employer]   06/14/22  0500 06/15/22 0500 06/16/22 0500  Weight: 86.4 kg 86.6 kg 90.2 kg    Physical Exam: General: Ill-appearing  Head: Normocephalic, atraumatic. Moist oral mucosal membranes  Eyes: Sclera jaundiced  Lungs:  Clear to auscultation  Heart: Regular rate and rhythm  Abdomen:  Soft, nontender, distended  Extremities: No peripheral edema.  Neurologic: Nonfocal, moving all four extremities  Skin: Generalized ecchymosis, jaundiced  Access: None     Lab results: Basic Metabolic Panel: Recent Labs  Lab 06/13/22 2100 06/14/22 0447 06/15/22 0405 06/16/22 0542 06/17/22 0926  NA 137 133* 134* 134* 136  K 3.2* 3.7 3.5 3.6 3.6  CL 103 105 108 106 107  CO2 24 21* 20* 18* 19*  GLUCOSE 174* 241* 180* 205* 206*  BUN 37* 38* 47* 58* 78*  CREATININE 0.92 0.73 1.62* 1.78* 2.68*  CALCIUM 8.7* 8.3* 8.0* 8.3* 8.3*  MG 1.9 2.2 2.1 2.2 2.4  PHOS  --  2.2* 3.9 3.7 4.2    Liver Function Tests: Recent Labs  Lab 06/15/22 0405 06/16/22 0542 06/17/22 0926  AST 114* 131* 133*  ALT 44 54* 51*  ALKPHOS 98 143* 154*  BILITOT 17.2* 21.0* 24.1*  PROT 6.3* 6.5 6.3*  ALBUMIN 1.8* 1.8* 1.7*   No results for input(s): "LIPASE", "AMYLASE" in the last 168 hours. Recent Labs  Lab 06/11/22 0718  AMMONIA 18    CBC: Recent Labs  Lab 06/14/22 0447 06/14/22 1542 06/15/22 0405 06/16/22 0542 06/17/22 0926  WBC 7.9 8.5 8.0 6.8 7.4  NEUTROABS  --  6.3  --   --   --   HGB 12.4* 12.3* 11.9* 13.1 12.5*  HCT 35.6* 32.6* 34.0* 37.9* 35.7*  MCV 104.7* 97 104.0* 103.8* 105.0*  PLT 46* 51* 57* 61* 73*    Cardiac Enzymes: No results for input(s): "CKTOTAL", "CKMB", "CKMBINDEX", "TROPONINI" in the last 168 hours.  BNP: Invalid input(s): "POCBNP"  CBG: Recent Labs   Lab 06/16/22 2038 06/17/22 0009 06/17/22 0446 06/17/22 0804 06/17/22 1147  GLUCAP 135* 172* 231* 198* 145*    Microbiology: Results for orders placed or performed during the hospital encounter of 06/01/2022  MRSA Next Gen by PCR, Nasal     Status: Abnormal   Collection Time: 06/16/2022  6:34 PM   Specimen: Nasal Mucosa; Nasal Swab  Result Value Ref Range Status   MRSA by PCR Next Gen DETECTED (A) NOT DETECTED Final    Comment: CRITICAL RESULT CALLED TO, READ BACK BY AND VERIFIED WITH: DONNA HESLEP RN @0047  06/11/22 ASW (NOTE) The GeneXpert MRSA Assay (FDA approved for NASAL specimens only), is one component of a comprehensive MRSA colonization surveillance program. It is not intended to diagnose MRSA infection nor to guide or monitor treatment for MRSA infections. Test performance is not FDA approved in patients less than 25 years old. Performed at [facility], [address] [place], [place] [address]   Culture, blood (Routine X 2) w Reflex to ID Panel     Status: Abnormal (Preliminary result)   Collection Time: 06/11/22 10:54 AM   Specimen: BLOOD  Result Value Ref Range Status   Specimen Description   Final    BLOOD Blood Culture adequate volume Performed at [facility], [address] [place], [place] [address]    Special Requests   Final    BOTTLES DRAWN AEROBIC AND ANAEROBIC LEFT ANTECUBITAL Performed at [facility], [address], [place], [place] [address]    Culture  Setup Time   Final  AEROBIC BOTTLE ONLY BUDDING YEAST SEEN CRITICAL RESULT CALLED TO, READ BACK BY AND VERIFIED WITH: CAROLYN COULTER ON 06/14/22 AT 0752 QSD    Culture (A)  Final    CRYPTOCOCCUS NEOFORMANS Sent to [place] for further susceptibility testing. Performed at [facility], [place] [address], [place], [place] [address]    Report Status PENDING  Incomplete  Culture, blood (Routine X 2) w Reflex to ID Panel     Status: Abnormal   Collection Time:  06/11/22 10:54 AM   Specimen: BLOOD  Result Value Ref Range Status   Specimen Description   Final    BLOOD Blood Culture adequate volume Performed at [facility], [address], [place], [place] [address]    Special Requests   Final    BOTTLES DRAWN AEROBIC AND ANAEROBIC BLOOD LEFT HAND Performed at [facility], [address] [place], [place] [address]    Culture  Setup Time   Final    BUDDING YEAST SEEN IN BOTH AEROBIC AND ANAEROBIC BOTTLES CRITICAL VALUE NOTED.  VALUE IS CONSISTENT WITH PREVIOUSLY REPORTED AND CALLED VALUE. Performed at [facility], [address] [place], [place] [address]    Culture CRYPTOCOCCUS NEOFORMANS (A)  Final   Report Status 06/16/2022 FINAL  Final  Blood Culture ID Panel (Reflexed)     Status: Abnormal   Collection Time: 06/11/22 10:54 AM  Result Value Ref Range Status   Enterococcus faecalis NOT DETECTED NOT DETECTED Final   Enterococcus Faecium NOT DETECTED NOT DETECTED Final   Listeria monocytogenes NOT DETECTED NOT DETECTED Final   Staphylococcus species NOT DETECTED NOT DETECTED Final   Staphylococcus aureus (BCID) NOT DETECTED NOT DETECTED Final   Staphylococcus epidermidis NOT DETECTED NOT DETECTED Final   Staphylococcus lugdunensis NOT DETECTED NOT DETECTED Final   Streptococcus species NOT DETECTED NOT DETECTED Final   Streptococcus agalactiae NOT DETECTED NOT DETECTED Final   Streptococcus pneumoniae NOT DETECTED NOT DETECTED Final   Streptococcus pyogenes NOT DETECTED NOT DETECTED Final   A.calcoaceticus-baumannii NOT DETECTED NOT DETECTED Final   Bacteroides fragilis NOT DETECTED NOT DETECTED Final   Enterobacterales NOT DETECTED NOT DETECTED Final   Enterobacter cloacae complex NOT DETECTED NOT DETECTED Final   Escherichia coli NOT DETECTED NOT DETECTED Final   Klebsiella aerogenes NOT DETECTED NOT DETECTED Final   Klebsiella oxytoca NOT DETECTED NOT DETECTED Final   Klebsiella pneumoniae NOT  DETECTED NOT DETECTED Final   Proteus species NOT DETECTED NOT DETECTED Final   Salmonella species NOT DETECTED NOT DETECTED Final   Serratia marcescens NOT DETECTED NOT DETECTED Final   Haemophilus influenzae NOT DETECTED NOT DETECTED Final   Neisseria meningitidis NOT DETECTED NOT DETECTED Final   Pseudomonas aeruginosa NOT DETECTED NOT DETECTED Final   Stenotrophomonas maltophilia NOT DETECTED NOT DETECTED Final   Candida albicans NOT DETECTED NOT DETECTED Final   Candida auris NOT DETECTED NOT DETECTED Final   Candida glabrata NOT DETECTED NOT DETECTED Final   Candida krusei NOT DETECTED NOT DETECTED Final   Candida parapsilosis NOT DETECTED NOT DETECTED Final   Candida tropicalis NOT DETECTED NOT DETECTED Final   Cryptococcus neoformans/gattii DETECTED (A) NOT DETECTED Final    Comment: CRITICAL RESULT CALLED TO, READ BACK BY AND VERIFIED WITH: CAROLYN COULTER ON 06/14/22 AT [id number] QSD Performed at [facility], [address] [place], [place] [address]   CSF culture w Gram Stain     Status: None   Collection Time: 06/13/22 10:44 AM   Specimen: CSF;  Cerebrospinal Fluid  Result Value Ref Range Status   Specimen Description   Final    CSF Performed at [facility], [address] [place], [place] [address]    Special Requests   Final    NONE Performed at [facility], [place], [place] [address]    Gram Stain   Final    WBC PRESENT,BOTH PMN AND MONONUCLEAR ENCAPSULATED YEAST CYTOSPIN SMEAR PREVIOUSLY REPORTED AS: NO ORGANISMS SEEN CORRECTED RESULTS CALLED TO: RN LUANNE HUMPHRIES ON 06/13/22 @ 2225 BY DRT Performed at [facility], [place] [address], [place], [place] [address]    Culture RARE CRYPTOCOCCUS NEOFORMANS  Final   Report Status 06/16/2022 FINAL  Final  Culture, fungus without smear     Status: Abnormal (Preliminary result)   Collection Time: 06/13/22 10:45 AM   Specimen: CSF; Cerebrospinal Fluid  Result Value Ref  Range Status   Specimen Description   Final    CSF Performed at [facility], [address] [place], [place] [address]    Special Requests   Final    NONE Performed at [facility], [address] [place], [place] [address]    Culture (A)  Final    CRYPTOCOCCUS NEOFORMANS CONTINUING TO HOLD Performed at [facility], [place] [address], [place], [place] [address]    Report Status PENDING  Incomplete  Anaerobic culture w Gram Stain     Status: None (Preliminary result)   Collection Time: 06/13/22 10:45 AM   Specimen: CSF; Cerebrospinal Fluid  Result Value Ref Range Status   Specimen Description   Final    CSF Performed at [facility], [address], [place], [place] [address]    Special Requests   Final    NONE Performed at [facility], [address] [place], [place] [address]    Gram Stain   Final    WBC PRESENT, PREDOMINANTLY PMN ENCAPSULATED YEAST CRITICAL VALUE NOTED.  VALUE IS CONSISTENT WITH PREVIOUSLY REPORTED AND CALLED VALUE. Performed at [facility], [place] [address], [place], [place] [address]    Culture   Final    NO ANAEROBES ISOLATED; CULTURE IN PROGRESS FOR 5 DAYS   Report Status PENDING  Incomplete  Culture, blood (Routine X 2) w Reflex to ID Panel     Status: None (Preliminary result)   Collection Time: 06/15/22  4:05 AM   Specimen: BLOOD  Result Value Ref Range Status   Specimen Description BLOOD BLOOD RIGHT ARM  Final   Special Requests   Final    BOTTLES DRAWN AEROBIC AND ANAEROBIC Blood Culture results may not be optimal due to an inadequate volume of blood received in culture bottles   Culture   Final    NO GROWTH 2 DAYS Performed at [facility], [address], [place], [place] [address]    Report Status PENDING  Incomplete  Culture, blood (Routine X 2) w Reflex to ID Panel     Status: None (Preliminary result)   Collection Time: 06/15/22  4:14 AM   Specimen: BLOOD  Result Value  Ref Range Status   Specimen Description BLOOD BLOOD RIGHT HAND  Final   Special Requests   Final    BOTTLES DRAWN AEROBIC AND ANAEROBIC Blood Culture results may not be optimal due to an excessive volume of blood received in culture bottles   Culture   Final    NO GROWTH 2 DAYS Performed at [facility], [address] [place], [place] [address]    Report Status PENDING  Incomplete  Gastrointestinal Panel by PCR , Stool     Status: Abnormal   Collection Time: 06/15/22  5:36 PM   Specimen: Stool  Result Value Ref Range Status   Campylobacter species NOT DETECTED NOT DETECTED Final   Plesimonas shigelloides NOT DETECTED NOT DETECTED Final   Salmonella species NOT DETECTED NOT DETECTED Final   Yersinia enterocolitica DETECTED (A) NOT DETECTED Final    Comment: CRITICAL RESULT CALLED TO, READ BACK BY AND VERIFIED WITH: LUANN HUMPRIES 06/15/2022 AT 1930 SRR    Vibrio species NOT DETECTED NOT DETECTED Final   Vibrio cholerae NOT DETECTED NOT DETECTED Final   Enteroaggregative E coli (EAEC) NOT DETECTED NOT DETECTED Final   Enteropathogenic E coli (EPEC) NOT DETECTED NOT DETECTED Final   Enterotoxigenic E coli (ETEC) NOT DETECTED NOT DETECTED Final   Shiga like toxin producing E coli (STEC) NOT DETECTED NOT DETECTED Final   Shigella/Enteroinvasive E coli (EIEC) NOT DETECTED NOT DETECTED Final   Cryptosporidium NOT DETECTED NOT DETECTED Final   Cyclospora cayetanensis NOT DETECTED NOT DETECTED Final   Entamoeba histolytica NOT DETECTED NOT DETECTED Final   Giardia lamblia NOT DETECTED NOT DETECTED Final   Adenovirus F40/41 NOT DETECTED NOT DETECTED Final   Astrovirus NOT DETECTED NOT DETECTED Final   Norovirus GI/GII NOT DETECTED NOT DETECTED Final   Rotavirus A NOT DETECTED NOT DETECTED Final   Sapovirus (I, II, IV, and V) NOT DETECTED NOT DETECTED Final    Comment: Performed at [facility], [address] [place],  [place] [address]    Coagulation Studies: No  results for input(s): "LABPROT", "INR" in the last 72 hours.  Urinalysis: No results for input(s): "COLORURINE", "LABSPEC", "PHURINE", "GLUCOSEU", "HGBUR", "BILIRUBINUR", "KETONESUR", "PROTEINUR", "UROBILINOGEN", "NITRITE", "LEUKOCYTESUR" in the last 72 hours.  Invalid input(s): "APPERANCEUR"    Imaging: US Abdomen Limited RUQ (LIVER/GB)  Result Date: 06/16/2022 CLINICAL DATA:  Hyperbilirubinemia EXAM: ULTRASOUND ABDOMEN LIMITED RIGHT UPPER QUADRANT COMPARISON:  06/13/2022 FINDINGS: Gallbladder: Gallstones: Mild cholelithiasis Sludge: None Gallbladder Wall: Mild gallbladder wall thickening measuring up to 5 mm Pericholecystic fluid: None Sonographic Murphy's Sign: Negative per technologist Common bile duct: Diameter: 2 mm Liver: Parenchymal echogenicity: Diffusely heterogeneous echotexture Contours: Nodular Lesions: None Portal vein: Patent.  Hepatopetal flow Other: Minimal perihepatic ascites. IMPRESSION: 1.  Cirrhotic liver morphology without focal hepatic lesion. 2. Mild cholelithiasis. Gallbladder wall thickening may be due to chronic cholecystitis or hepatitis. If there is high clinical concern for cholecystitis, further evaluation with HIDA scan should be performed. Electronically Signed   By: Miachel Roux M.D.   On: 06/16/2022 16:00     Assessment & Plan: Mr. ZYKELL EVITT is a 57 y.o.  male with past medical conditions including hyperlipidemia, diabetes, hypertension, GERD, BPH, stroke, polysubstance abuse, diastolic heart failure, liver cirrhosis and hepatocellular carcinoma, who was admitted to [facility] on 06/16/2022 for Hepatic encephalopathy ([place]) [K76.82] Hepatic cirrhosis, unspecified hepatic cirrhosis type, unspecified whether ascites present ([place]) [K74.60]  Acute kidney injury etiology unclear at this time.  Stated history of liver cirrhosis and hepatocellular carcinoma.  May present a hepatorenal component.  Normal renal function noted on admission.  No IV contrast exposure.  No  surgical procedures resulting in hypotension.  Urinary retention noted, resolved with Foley catheter placement.  Patient has been hypotensive today, but not around the time kidney injury began.  Urine output 250 mL recorded on day shift yesterday, total 8 occurrences recorded in  previous 24 hours.  Agree with IV fluids.  Continue to avoid nephrotoxic agents and therapies, including hypotension if possible.  No acute need for dialysis at this time.  Due to current comorbidities, patient will make a poor long-term dialysis patient.  Will continue to monitor for now.  Recommend palliative care consult to discuss goals of care.  2.  Acute metabolic acidosis.  Will order sodium bicarb at 100 mL/h.  3. Anemia of chronic disease, macrocytic; thrombocytopenia Lab Results  Component Value Date   HGB 12.5 (L) 06/17/2022  No iron studies have been obtained.  Will monitor for now. Platelets have increased over the course of this admission however remained low at 73.  4.  Hypotension with history of hypertension.  Home regimen includes amlodipine, hydrochlorothiazide, losartan, and metoprolol.  All antihypertensives held.  Currently receiving midodrine.  LOS: 7   3/19/20242:03 PM

## 2022-06-17 NOTE — Progress Notes (Signed)
[facility] Progress Note  Patient: Johnny Kelley    [id number]  DOA: 06/03/2022     Date of Service: the patient was seen and examined on 06/17/2022  Chief Complaint  Patient presents with   Altered Mental Status   Brief hospital course: Johnny Kelley is a 57 y.o. male with medical history significant of hypertension, hyperlipidemia, diabetes mellitus, diastolic CHF, stroke, GERD, BPH, polysubstance abuse (alcohol, tobacco and cocaine), hepatocellular carcinoma (s/p of embolization treatment in [facility] per his brother), liver cirrhosis, who presents with altered mental status, fall and back pain. Per his brother (I called his brother by phone), patient has chronic back pain with abnormal MRI findings. Pt has an appointment with neurosurgeon, Johnny Kelley on 3/14.  Due to back pain, patient has difficulty walking and decreased mobility.  It is very difficult for his brother to take care of him at home. Patient has been confused in the past several days. In the ED  patient was very lethargic, mildly confused, but still orientated x 3.  He did complain of mid and lower back pain, which is constant, severe, nonradiating.  He also has bilateral leg weakness.    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  CT head negative.  UDS negative. -Frequent neurocheck -Fall precaution -Lactulose 20 g 3 times daily via NG tube, s/p lactulose enema d/c'd  Patient had fever, empirically patient received ceftriaxone, vancomycin which has been discontinued after diagnosis of cryptococcal meningitis.  DC rifaximin as well. 3/15 herpetic lesion noticed on upper lip, may be viral encephalitis causing AMS Lumbar puncture ordered. S/p acyclovir IV was given which was discontinued after diagnosis of cryptococcal meningitis [place] abd was done for paracentesis but there was no fluid to  Tap, so could not r/o SBP 3/13 blood culture positive for Cryptococcus neoformans/gattii  3/15 CSF shows cryptococcal antigen titer 2560, CSF WBC 148-212, glucose 72, RBC 31, protein 195 3/15 NG tube was inserted for feeding, dietitian consulted. 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  Mild cholelithiasis. Gallbladder wall thickening may be due to chronic cholecystitis or hepatitis. If there is high clinical concern for cholecystitis, further evaluation with HIDA scan should be performed. Monitor LFTs Follow HIDA scan  Cirrhosis [facility]): Elevated LFTs -Cautiously use Tylenol -Patient will need counseling about importance of quitting alcohol drinking Elevated bilirubin   Fall at home, initial encounter: CT head and neck negative. -Fall precaution -PT/OT   Hyperkalemia: Potassium 5.3 S/p Leukoma 5 g and s/p 40 mg Lasix due to elevated BNP K 3.6  Hypokalemia, potassium 3.2 on 3/15 Potassium was repleted. Hypophosphatemia, Phos repleted. Monitor electrolytes and replete as needed.  HTN (hypertension) D/c'd Amlodipine, HCTZ, metoprolol, due to hypotension -Hold losartan due to hyperkalemia  S/p NS 500  mL bolus given 3/16 started midodrine 10 mg p.o. 3 times daily  3/15 SVT 6 beats followed by 16 beats, patient was unable to offer any complaint due to encephalopathy.  Lopressor 5 mg IV 1 dose given. mg 1.9 wnl 3/16 s/p metoprolol 5 mg IV TID, DC'd on 3/19 due to low blood pressure We will continue to monitor BP and titrate medications accordingly 3/19 NS 500 mL bolus ordered  History of CVA (cerebrovascular accident) -Patient is not taking Plavix due to easy bleeding   Chronic diastolic CHF (congestive heart failure) ([place]): 2D echo on 06/24/2021 showed EF of 60 to 65% with grade 1 diastolic dysfunction.  BNP is elevated 418 and chest x-ray with vascular crowding, but patient only has trace leg edema, no JVT, no shortness of breath or oxygen desaturation.  Does not seem to have CHF exacerbation.  Patient is at risk of developing CHF exacerbation -s/p 1 dose of Lasix to 40 mg x 1  -Held HCTZ due to low blood pressure   Type 2 diabetes mellitus with diabetic neuropathy, without long-term current use of insulin ([place]): Recent A1c 5.7, well-controlled.  Patient taking Trulicity, metformin and Jardiance. -Sliding scale insulin 3/15 increased Semglee 22 units daily  BPH with obstruction/lower urinary tract symptoms -Resume Flomax when able to take oral medications   Thrombocytopenia ([place]): this is a chronic issue due to liver cirrhosis. Plts 38--48--46--61--73 -Follow-up CBC  Alcohol abuse and Tobacco abuse -Did counseling about importance of quitting alcohol use and tobacco use -CIWA protocol -Nicotine patch   Back pain: Patient has chronic mid and lower back pain, with abnormal MRI findings as above.  Patient has an appointment with Johnny Kelley of neurosurgery on 3/14 -Consulted Johnny Kelley for neurosurgery, recommended no intervention until his mental status improves.   Body mass index is 24.48 kg/m.  Interventions:    Diet: NG tube feeding, SLP eval order placed  DVT  Prophylaxis: SCD, pharmacological prophylaxis contraindicated due to thrombocytopenia and bleeding    Advance goals of care discussion: DNR  Family Communication: family was not present at bedside, at the time of interview.  The pt provided permission to discuss medical plan with the family. Opportunity was given to ask question and all questions were answered satisfactorily.  3/16 d/w patient's brother over the phone  Disposition:  Pt is from Home, admitted with AMS, found to have cryptococcal meningitis, on IV antifungal medication, ID consulted, which precludes a safe discharge. Discharge to SNF, when clinically stable.  Subjective: No significant overnight events, patient is awake and alert today, patient was able to tell me his name and birthday, AO x 1-2 knows that he is in the hospital but not aware where, does not know month and year.  Patient stated that he is feeling fine, unable to offer any complaints.  Physical Exam: General: NAD, lying comfortably Appear in no distress Eyes: closed, spontaneously opening briefly ENT: Oral Mucosa Clear, dry,  Neck: no JVD,  Cardiovascular: S1 and S2 Present, no Murmur,  Respiratory: good respiratory effort, Bilateral Air entry equal and Decreased, no Crackles, no wheezes Abdomen: Bowel Sound present, Soft and no tenderness, genital warts Skin: no rashes Extremities: no Pedal edema, no calf tenderness Neurologic: AAO x 1, improving, still disoriented, no any focal findings Gait not checked due to patient safety concerns  Vitals:   06/17/22 0443 06/17/22 0805 06/17/22 1144 06/17/22 1146  BP: (!) 107/58 (!) 114/58 (!) 80/50 (!) 75/52  Pulse: 85 86 72  71  Resp: 18 16 18    Temp: 98.9 F (37.2 C) 98.5 F (36.9 C) 98.7 F (37.1 C)   TempSrc:      SpO2: 96% 94% 96%   Weight:      Height:        Intake/Output Summary (Last 24 hours) at 06/17/2022 1250 Last data filed at 06/17/2022 0701 Gross per 24 hour  Intake 2947.43 ml  Output 250  ml  Net 2697.43 ml   Filed Weights   06/14/22 0500 06/15/22 0500 06/16/22 0500  Weight: 86.4 kg 86.6 kg 90.2 kg    Data Reviewed: I have personally reviewed and interpreted daily labs, tele strips, imagings as discussed above. I reviewed all nursing notes, pharmacy notes, vitals, pertinent old records I have discussed plan of care as described above with RN and patient/family.  CBC: Recent Labs  Lab 06/14/22 0447 06/14/22 1542 06/15/22 0405 06/16/22 0542 06/17/22 0926  WBC 7.9 8.5 8.0 6.8 7.4  NEUTROABS  --  6.3  --   --   --   HGB 12.4* 12.3* 11.9* 13.1 12.5*  HCT 35.6* 32.6* 34.0* 37.9* 35.7*  MCV 104.7* 97 104.0* 103.8* 105.0*  PLT 46* 51* 57* 61* 73*   Basic Metabolic Panel: Recent Labs  Lab 06/13/22 0504 06/13/22 2100 06/14/22 0447 06/15/22 0405 06/16/22 0542 06/17/22 0926  NA 137 137 133* 134* 134* 136  K 3.2* 3.2* 3.7 3.5 3.6 3.6  CL 103 103 105 108 106 107  CO2 23 24 21* 20* 18* 19*  GLUCOSE 207* 174* 241* 180* 205* 206*  BUN 38* 37* 38* 47* 58* 78*  CREATININE 1.08 0.92 0.73 1.62* 1.78* 2.68*  CALCIUM 8.7* 8.7* 8.3* 8.0* 8.3* 8.3*  MG 1.9 1.9 2.2 2.1 2.2 2.4  PHOS 3.5  --  2.2* 3.9 3.7 4.2    Studies: US Abdomen Limited RUQ (LIVER/GB)  Result Date: 06/16/2022 CLINICAL DATA:  Hyperbilirubinemia EXAM: ULTRASOUND ABDOMEN LIMITED RIGHT UPPER QUADRANT COMPARISON:  06/13/2022 FINDINGS: Gallbladder: Gallstones: Mild cholelithiasis Sludge: None Gallbladder Wall: Mild gallbladder wall thickening measuring up to 5 mm Pericholecystic fluid: None Sonographic Murphy's Sign: Negative per technologist Common bile duct: Diameter: 2 mm Liver: Parenchymal echogenicity: Diffusely heterogeneous echotexture Contours: Nodular Lesions: None Portal vein: Patent.  Hepatopetal flow Other: Minimal perihepatic ascites. IMPRESSION: 1.  Cirrhotic liver morphology without focal hepatic lesion. 2. Mild cholelithiasis. Gallbladder wall thickening may be due to chronic cholecystitis or  hepatitis. If there is high clinical concern for cholecystitis, further evaluation with HIDA scan should be performed. Electronically Signed   By: Miachel Roux M.D.   On: 06/16/2022 16:00    Scheduled Meds:  Chlorhexidine Gluconate Cloth  6 each Topical Q0600   clotrimazole   Topical BID   dextrose  10 mL Intravenous Q24H   dextrose  10 mL Intravenous Q24H   flucytosine  25 mg/kg Per Tube [id number]   folic acid  1 mg Per Tube Daily   free water  30 mL Per Tube Q4H   insulin aspart  0-5 Units Subcutaneous QHS   insulin aspart  0-9 Units Subcutaneous TID WC   insulin glargine-yfgn  22 Units Subcutaneous Daily   lactulose  20 g Per Tube TID   lidocaine  1 patch Transdermal Q24H   midodrine  10 mg Per NG tube TID WC   multivitamin  15 mL Per Tube Daily   nicotine  21 mg Transdermal Daily   pantoprazole (PROTONIX) IV  40 mg Intravenous Q24H   phenol  1 spray Mouth/Throat TID   sodium chloride  500 mL Intravenous Q24H   sodium chloride  500 mL Intravenous Q24H   thiamine  100 mg Per Tube Daily   Or   thiamine  100 mg Intravenous Daily   Continuous Infusions:  amphotericin B liposome (AMBISOME) 260 mg in dextrose 5 % 500 mL IVPB 260 mg (06/17/22 0225)   chlorproMAZINE (THORAZINE) 12.5 mg in sodium chloride 0.9 % 25 mL IVPB 50 mL/hr at 06/16/22 1147   ciprofloxacin 400 mg (06/17/22 1137)   feeding supplement (GLUCERNA 1.5 CAL) 70 mL/hr at 06/17/22 0700   sodium chloride     PRN Meds: acetaminophen, acetaminophen, chlorproMAZINE (THORAZINE) 12.5 mg in sodium chloride 0.9 % 25 mL IVPB, diphenhydrAMINE **OR** diphenhydrAMINE, hydrALAZINE, liver oil-zinc oxide, LORazepam, meperidine (DEMEROL) injection, morphine injection, nystatin, ondansetron (ZOFRAN) IV, oxyCODONE  Time spent: 50 minutes  Author: Val Riles. MD [facility] 06/17/2022 12:50 PM  To reach On-call, see care teams to locate the attending and reach out to them via [email or web address]. If 7PM-7AM, please contact  night-coverage If you still have difficulty reaching the attending provider, please page the [facility] (Director on Call) for [facility] on amion for assistance.

## 2022-06-17 NOTE — Consult Note (Signed)
Pharmacy Antibiotic Note  Johnny Kelley is a 57 y.o. male admitted on 06/04/2022 with confusion. PMH significant for HTN, HLD, DM, dCHF, stroke, GERD, BPH, polysubstance abuse (alcohol, tobacco and cocaine), hepatocellular carcinoma (s/p of embolization treatment in [facility] per his brother), liver cirrhosis. On 3/15 a herpetic lesion was noticed on his upper lip, and LP with CSF studies was ordered. ME panel positive for Cryptococcus neoformans/gatti. Blood cultures from 3/13 also positive for yeast and BCID deteted Cryptococcus neoformans. Pharmacy has been consulted for amphotericin B dosing.  Today, 06/17/2022 Day 3 antifungal therapy  Renal: SCR up to 2.68 from 1.78 3/17 (0.61 at baseline). UOP appears decreased K+ 3.6 Mg++ 2.4 WBC 7.4 afebrile LFTs: AST 133, ALT 51 --> stable, Tbili 24.1 (trending up) Maintenance IVF: none (off LR) GI panel: yersinia enterolytica CD4 count = 585 Repeat blood cultures 3/17 are NGTD  Plan: Continue Liposomal Amphotericin-B 260 mg IV daily (at 2200) mg/kg/day TBW)  Give initial 500 mL NS bolus followed by D5 flush then administer AmphoB infusion over 2h After completion of AmphoB infusion, give D5 flush then 500 mL NS bolus AmphoB not compatible with NS, requires D5 flush in between AmphoB and NS administration  NS bolus required for reduction in nephrotoxicity risk Supportive medications PRN: demerol (rigors), diphenhydramine and acetaminophen (infusion reaction) Adjust Flucytosine to 2250 mg per tube q12h based on worsening renal function  No recommendations for hepatic adjustment for either medication.  Neither extensively metabolized by liver No electrolyte (potassium or magnesium) replacement necessary today  Ciprofloxacin 500mg  IV q12h for Yersinia for now and may need adjustment if SCr worsens Continue to monitor renal function closely, replace Mg and K as needed, and follow culture results  Follow-up if appropriate to add MIVF in setting of AKI  (suspect volume loss from diarrhea as well)  Height: 6\' 2"  (188 cm) Weight: 90.2 kg (198 lb 13.7 oz) IBW/kg (Calculated) : 82.2  Temp (24hrs), Avg:98.4 F (36.9 C), Min:97 F (36.1 C), Max:98.9 F (37.2 C)  Recent Labs  Lab 06/11/22 1333 06/11/22 1623 06/12/22 0627 06/13/22 2100 06/14/22 0447 06/14/22 1542 06/15/22 0405 06/16/22 0542 06/17/22 0926  WBC  --   --    < >  --  7.9 8.5 8.0 6.8 7.4  CREATININE  --   --    < > 0.92 0.73  --  1.62* 1.78* 2.68*  LATICACIDVEN 3.0* 2.7*  --   --   --   --   --   --   --    < > = values in this interval not displayed.     Estimated Creatinine Clearance: 34.9 mL/min (A) (by C-G formula based on SCr of 2.68 mg/dL (H)).    No Known Allergies  Antimicrobials this admission: 3/13 Vancomycin >> 3/15 3/13 Ceftriaxone >> 3/15 3/15 Acyclovir >> 3/15 3/14 Rifaximin >> 3/16 3/15 Amphotericin B >> 3/16 Flucytosine >> 3/18 ciprofloxacin >>   Dose adjustments this admission: 3/15 Vancomycin 1500 mg IV Q12H >> Vancomycin 1250 mg IV Q12H   Microbiology results: 3/13 BCx: yeast, BCID Cryptococcus neoformans/gattii 3/12 MRSA PCR: positive 3/15 CSF Cx: encapsulated yeast consistent with other findings 3/15 CSF VRDL: IP 3/15 ME Panel: Cryptococcus neoformans/gattii detected 3/17 BCx: NGTD  Thank you for allowing pharmacy to be a part of this patient's care.  Doreene Eland, PharmD, BCPS, BCIDP Work Cell: [phone] 06/17/2022 2:00 PM

## 2022-06-18 ENCOUNTER — Inpatient Hospital Stay: Payer: Medicaid Other

## 2022-06-18 ENCOUNTER — Other Ambulatory Visit: Payer: Self-pay | Admitting: Nurse Practitioner

## 2022-06-18 DIAGNOSIS — F1010 Alcohol abuse, uncomplicated: Secondary | ICD-10-CM | POA: Diagnosis not present

## 2022-06-18 DIAGNOSIS — B451 Cerebral cryptococcosis: Secondary | ICD-10-CM | POA: Diagnosis not present

## 2022-06-18 DIAGNOSIS — M48061 Spinal stenosis, lumbar region without neurogenic claudication: Secondary | ICD-10-CM

## 2022-06-18 DIAGNOSIS — K7682 Hepatic encephalopathy: Secondary | ICD-10-CM | POA: Diagnosis not present

## 2022-06-18 DIAGNOSIS — I10 Essential (primary) hypertension: Secondary | ICD-10-CM

## 2022-06-18 DIAGNOSIS — K7460 Unspecified cirrhosis of liver: Secondary | ICD-10-CM | POA: Diagnosis not present

## 2022-06-18 DIAGNOSIS — Z515 Encounter for palliative care: Secondary | ICD-10-CM

## 2022-06-18 LAB — BASIC METABOLIC PANEL
Anion gap: 7 (ref 5–15)
BUN: 84 mg/dL — ABNORMAL HIGH (ref 6–20)
CO2: 20 mmol/L — ABNORMAL LOW (ref 22–32)
Calcium: 7.8 mg/dL — ABNORMAL LOW (ref 8.9–10.3)
Chloride: 108 mmol/L (ref 98–111)
Creatinine, Ser: 3.45 mg/dL — ABNORMAL HIGH (ref 0.61–1.24)
GFR, Estimated: 20 mL/min — ABNORMAL LOW (ref >60)
Glucose, Bld: 168 mg/dL — ABNORMAL HIGH (ref 70–99)
Potassium: 3.3 mmol/L — ABNORMAL LOW (ref 3.5–5.1)
Sodium: 135 mmol/L (ref 135–145)

## 2022-06-18 LAB — HEPATIC FUNCTION PANEL
ALT: 46 U/L — ABNORMAL HIGH (ref 0–44)
AST: 105 U/L — ABNORMAL HIGH (ref 15–41)
Albumin: 2.1 g/dL — ABNORMAL LOW (ref 3.5–5.0)
Alkaline Phosphatase: 102 U/L (ref 38–126)
Bilirubin, Direct: 12.9 mg/dL — ABNORMAL HIGH (ref 0.0–0.2)
Total Bilirubin: 24.6 mg/dL (ref 0.3–1.2)
Total Protein: 5.7 g/dL — ABNORMAL LOW (ref 6.5–8.1)

## 2022-06-18 LAB — PROTIME-INR
INR: 2.1 — ABNORMAL HIGH (ref 0.8–1.2)
Prothrombin Time: 23 seconds — ABNORMAL HIGH (ref 11.4–15.2)

## 2022-06-18 LAB — CBC
HCT: 32 % — ABNORMAL LOW (ref 39.0–52.0)
Hemoglobin: 11.1 g/dL — ABNORMAL LOW (ref 13.0–17.0)
MCH: 36.2 pg — ABNORMAL HIGH (ref 26.0–34.0)
MCHC: 34.7 g/dL (ref 30.0–36.0)
MCV: 104.2 fL — ABNORMAL HIGH (ref 80.0–100.0)
Platelets: 61 10*3/uL — ABNORMAL LOW (ref 150–400)
RBC: 3.07 MIL/uL — ABNORMAL LOW (ref 4.22–5.81)
RDW: 18.4 % — ABNORMAL HIGH (ref 11.5–15.5)
WBC: 6 10*3/uL (ref 4.0–10.5)
nRBC: 0 % (ref 0.0–0.2)

## 2022-06-18 LAB — GLUCOSE, CAPILLARY
Glucose-Capillary: 180 mg/dL — ABNORMAL HIGH (ref 70–99)
Glucose-Capillary: 159 mg/dL — ABNORMAL HIGH (ref 70–99)
Glucose-Capillary: 117 mg/dL — ABNORMAL HIGH (ref 70–99)
Glucose-Capillary: 82 mg/dL (ref 70–99)
Glucose-Capillary: 97 mg/dL (ref 70–99)

## 2022-06-18 LAB — RPR: RPR Ser Ql: NONREACTIVE

## 2022-06-18 LAB — ANAEROBIC CULTURE W GRAM STAIN

## 2022-06-18 LAB — PHOSPHORUS: Phosphorus: 4.7 mg/dL — ABNORMAL HIGH (ref 2.5–4.6)

## 2022-06-18 LAB — MAGNESIUM: Magnesium: 2.3 mg/dL (ref 1.7–2.4)

## 2022-06-18 MED ORDER — HYDROMORPHONE HCL 1 MG/ML IJ SOLN: 1.0000 mg | INTRAMUSCULAR | Status: DC | PRN

## 2022-06-18 MED ORDER — POTASSIUM CHLORIDE 20 MEQ PO PACK: 20.0000 meq | PACK | Freq: Once | ORAL | Status: DC

## 2022-06-18 MED ORDER — IOHEXOL 9 MG/ML PO SOLN
500.0000 mL | ORAL | Status: AC
  Administered 2022-06-18: 500 mL via ORAL
  Administered 2022-06-18: 250 mL via ORAL

## 2022-06-18 MED ORDER — TECHNETIUM TC 99M MEBROFENIN IV KIT
10.0100 | PACK | Freq: Once | INTRAVENOUS | Status: AC | PRN
  Administered 2022-06-18: 10.01 via INTRAVENOUS

## 2022-06-18 MED ORDER — CIPROFLOXACIN IN D5W 400 MG/200ML IV SOLN
400.0000 mg | INTRAVENOUS | Status: DC
  Filled 2022-06-18: qty 200

## 2022-06-20 LAB — CULTURE, BLOOD (ROUTINE X 2)
Culture: NO GROWTH
Culture: NO GROWTH

## 2022-06-20 LAB — FUNGUS STAIN

## 2022-06-20 LAB — FUNGAL STAIN REFLEX

## 2022-06-21 LAB — ANTIFUNGAL AST 9 DRUG PANEL
Amphotericin B MIC: 0.25
Anidulafungin MIC: 8
Caspofungin MIC: 8
Fluconazole Islt MIC: 8
Flucytosine MIC: 4
Itraconazole MIC: 0.12
Micafungin MIC: 8
Posaconazole MIC: 0.25
Voriconazole MIC: 0.06

## 2022-06-22 LAB — CULTURE, BLOOD (ROUTINE X 2): Specimen Description: ADEQUATE

## 2022-06-23 ENCOUNTER — Other Ambulatory Visit: Payer: Self-pay | Admitting: Nurse Practitioner

## 2022-06-23 ENCOUNTER — Ambulatory Visit: Payer: Medicaid Other | Admitting: Podiatry

## 2022-06-23 DIAGNOSIS — I10 Essential (primary) hypertension: Secondary | ICD-10-CM

## 2022-06-24 LAB — CRYPTOCOCCUS AG TITER, SERUM: Cryptococcus Ag Titer, Serum: 1:320 {titer} — ABNORMAL HIGH

## 2022-06-24 LAB — CRYPTOCOCCUS ANTIGEN, SERUM: Cryptococcus Antigen, Serum: POSITIVE — AB

## 2022-06-26 ENCOUNTER — Ambulatory Visit: Payer: Medicaid Other | Admitting: Physician Assistant

## 2022-06-30 NOTE — Progress Notes (Signed)
Nutrition Follow-up  DOCUMENTATION CODES:   Non-severe (moderate) malnutrition in context of chronic illness  INTERVENTION:   -Continue TF via NGT:    Glucerna 1.5 @ 70 ml/hr    30 ml free water flush every 4 hours   Tube feeding regimen provides 2520 kcal (100% of needs), 139 grams of protein, and 1275 ml of H2O. Total free water: 1455 ml daily   -Monitor Mg, K, and Phos daily and replete as needed due to high refeeding risk -100 mg thiamine IV daily (ordered by MD) -If aggressive care is desired, pt may need permanent feeding access (ex PEG)  NUTRITION DIAGNOSIS:   Moderate Malnutrition related to chronic illness (cirrhosis) as evidenced by mild fat depletion, mild muscle depletion, moderate muscle depletion.  Ongoing  GOAL:   Patient will meet greater than or equal to 90% of their needs  Met with TF  MONITOR:   PO intake, Supplement acceptance, TF tolerance  REASON FOR ASSESSMENT:   Consult Enteral/tube feeding initiation and management  ASSESSMENT:   Pt with medical history significant of hypertension, hyperlipidemia, diabetes mellitus, diastolic CHF, stroke, GERD, BPH, polysubstance abuse (alcohol, tobacco and cocaine), hepatocellular carcinoma (s/p of embolization treatment in [facility] per his brother), liver cirrhosis, who presents with altered mental status, fall and back pain.  3/15- NGT placed (14 french); KUB revealed tip of tube in the stomach with the side port near the gastroesophageal junction 3/16- NGT replaced (18 french) due to displacement; KUB reveals tip and side port below the diaphragm   Reviewed I/O's: +1.7 L x 24 hours and +7.6 L since admission  UOP: 300 ml x 24 hours   Case discussed with RN and SLP. Pt NPO for hiatal scan today. Pt remains with NGT and receiving TF for nutrition. Per discussion with RN, pt is a little more alert in comparison to yesterday (will arouse briefly and fall back asleep), however, remains unable to participate in  SLP evaluation. Palliative care has been consulted for goals of care. If aggressive care is warranted, pt will require permanent feeding access (ex PEG).   No wt loss noted since admission.   Medications reviewed and include folic acid, potassium chloride, and thiamine.   Labs reviewed: K: 3.3, CBGS: 82-159 (inpatient orders for glycemic control are 0-5 units insulin aspart daily at bedtime, 0-9 units insulin aspart TID with meals, and 22 units insulin glargine-yfgn daily).    Diet Order:   Diet Order             Diet NPO time specified  Diet effective now                   EDUCATION NEEDS:   Not appropriate for education at this time  Skin:  Skin Assessment: Reviewed RN Assessment  Last BM:  06/16/22 (type 7)  Height:   Ht Readings from Last 1 Encounters:  06/12/2022 6\' 2"  (1.88 m)    Weight:   Wt Readings from Last 1 Encounters:  12-Jul-2022 90.9 kg    Ideal Body Weight:  86.4 kg  BMI:  Body mass index is 25.73 kg/m.  Estimated Nutritional Needs:   Kcal:  2500-2700  Protein:  130-145 grams  Fluid:  > 2 L    Loistine Chance, RD, LDN, [place] Registered Dietitian II Certified Diabetes Care and Education Specialist Please refer to [facility] for RD and/or RD on-call/weekend/after hours pager

## 2022-06-30 NOTE — Progress Notes (Signed)
SLP Cancellation Note  Patient Details Name: JAQUISE CANARIO MRN: [id number] DOB: 1966-03-10   Cancelled treatment:       Reason Eval/Treat Not Completed: Medical issues which prohibited therapy;Patient's level of consciousness;Patient not medically ready (chart reviewed; consulted NSG/Dietician re: pt's status; consulted MD.)  Pt remains encephalopathic and minimally engages w/ NSG, staff. Eyes closed, minimally verbal. NGT in place for nutrition/hydration.  Pt's dx includes Cryptococcal meningitis; Malnutrition of moderate degree; ETOH abuse. Pt has a h/o CVA; was living at home.  A Palliative Care consult has been placed in setting of pt's continued decline of medical status/State during admit.   Recommend Hold on BSE at this time until pt can fully arouse, give full Attention/focus, and engage fully in tasks; verbally engage. Pt must exhibit strong State and engagement for safe participation in BSE. Recommend frequent oral care for hygiene and stimulation of swallowing.  NSG updated, agreed. MD agreed and will reconsult ST services when pt is appropriate for BSE.        Orinda Kenner, MS, CCC-SLP Speech Language Pathologist Rehab Services; [place] [phone] (ascom) Jocelin Schuelke 07/11/2022, 10:40 AM

## 2022-06-30 NOTE — Progress Notes (Signed)
PT Cancellation Note  Patient Details Name: Johnny Kelley MRN: [id number] DOB: 06/16/1965   Cancelled Treatment:    Reason Eval/Treat Not Completed: Fatigue/lethargy limiting ability to participate;Patient's level of consciousness Pt sleeping soundly with mitts donned on arrival.  Apart from intermittent groaning he was unable to show any interaction.  The most I got him to "do" today was open his eyes, once, briefly after "yelling" his name, prior gentle shoulder rub and saying his name multiple times in a normal-gradually increasing volume got no reaction. Tried some light LE PROM exercises with no acknowledgement of activity, only intermittent groaning. Pt has not been able to participate meaningfully with PT or OT for ~1 week - will likely complete PT orders and await new orders should his situation and cognition change to allow for appropriate participation.     Kreg Shropshire, DPT 07/09/2022, 3:46 PM

## 2022-06-30 NOTE — Consult Note (Signed)
Consultation Note Date: 16-Jul-2022 at 1300  Patient Name: Johnny Kelley  DOB: 1965-07-13  MRN: [id number]  Age / Sex: 57 y.o., male  PCP: Venita Lick, NP Referring Physician: Val Riles, MD  Reason for Consultation: Establishing goals of care  HPI/Patient Profile: 57 y.o. male  with past medical history of hypertension, hyperlipidemia, diabetes mellitus, diastolic CHF, stroke, GERD, BPH, polysubstance abuse (alcohol, tobacco and cocaine), hepatocellular carcinoma (s/p of embolization treatment in [facility] per his brother), and liver cirrhosis admitted on 06/27/2022 with AMS, fall, and back pain.   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  PMT was consulted on day 8 of hospitalization to discuss goals of care.  Clinical Assessment and Goals of Care: I have reviewed medical records including EPIC notes, labs and imaging and then met with patient's brother Johnny Kelley at bedside to discuss diagnosis prognosis, GOC, EOL wishes, disposition and options. Patient was off the floor for hiatal scan during my initial visit.  During afternoon rounds, I assessed patient at bedside.  Respirations are even and unlabored.  Patient easily awakens but quickly falls back to sleep.  No nonverbal signs of distress or pain noted at this time.  I introduced Palliative Medicine as specialized medical care for people living with serious illness. It focuses on providing relief from the symptoms and stress of a serious illness. The goal is to improve quality of life for both the patient and the family.  We discussed a brief life review of the patient. Patient worked as a Development worker, international aid. He is divorced and has never had children. He has one brother, Johnny Kelley, and a half sister. He lives just beside his brother Johnny Kelley.  As  far as functional and nutritional status Johnny Kelley states he noticed patient did not go as far from the home and was not as active after his stroke approximately 3 years ago. Johnny Kelley shares that patient was independent with ADLs until about 3 weeks ago.  He Johnny Kelley states that patient was walking with a cane and having great difficulty, started walking with a walker, and eventually was unable to get out of bed over the last week.  Johnny Kelley endorses patient had a healthy appetite until about 3 weeks ago.  Johnny Kelley shares patient started to eat less and less and only a few bites of meals over the last week.  Johnny Kelley notes that declined status post patient stroke 3 years ago but an even more drastic and significant change in patient's functional, nutritional, and cognitive status over the last 3 weeks.  We discussed patient's current illness and what it means in the larger context of patient's on-going co-morbidities.  We talked about meningitis, alcohol abuse, hepatocellular carcinoma with cirrhosis, and hepatic encephalopathy.  Natural disease trajectory discussed.  I attempted to elicit values and goals of care important to the patient.  Johnny Kelley shares that when patient was of sound mind several years ago he stated that if patient is unable to "get up  and go" and to take care of himself then he would want to just "be let go".  We discussed aggressive measures versus treating the treatable.  Johnny Kelley is hopeful that patient will gain some mental clarity and be able to make his wishes known.  However, Johnny Kelley is clear with me today that he believes that the patient cannot stand up, walk himself to the bathroom, feed himself, and be independent then he would not want to continue with aggressive medical treatment.  CODE STATUS, advanced care planning, and boundaries of care discussed with Johnny Kelley.  DNR remains.  The difference between aggressive medical intervention and comfort care was considered in light of the patient's goals of care.    Education offered regarding concept specific to human mortality and the limitations of medical interventions to prolong life when the body is facing chronic, irreversible diseases.  Patient and Johnny Kelley are facing treatment option decisions, advanced directive, and anticipatory care needs.  Johnny Kelley is concerned that he is no longer able to take care of the patient at home.  We discussed potential of SNF for rehab as well as hospice care.  Ongoing discussions and support to continue.   Discussed with Johnny Kelley the importance of continued conversation with family and the medical providers regarding overall plan of care and treatment options, ensuring decisions are within the context of the patient's values and GOCs.    Questions and concerns were addressed.  PMT contact info given and Johnny Kelley was encouraged to call with questions or concerns. PMT will continue to follow.  Primary Decision Maker NEXT OF KIN  Physical Exam Vitals reviewed.  Constitutional:      General: He is not in acute distress.    Appearance: He is normal weight. He is ill-appearing.  HENT:     Nose:     Comments: NG in place Cardiovascular:     Rate and Rhythm: Normal rate.     Pulses: Normal pulses.  Pulmonary:     Effort: Pulmonary effort is normal.  Skin:    General: Skin is warm and dry.     Palliative Assessment/Data: 30%     Thank you for this consult. Palliative medicine will continue to follow and assist holistically.   Time Total: 75 minutes Greater than 50%  of this time was spent counseling and coordinating care related to the above assessment and plan.  Signed by: Jordan Hawks, DNP, FNP-BC Palliative Medicine    Please contact Palliative Medicine Team phone at [phone] for questions and concerns.  For individual provider: See Shea Evans

## 2022-06-30 NOTE — Progress Notes (Signed)
Patient's brother Dominica Severin notified of patient death. Will need to make arrangements with funeral home tomorrow. Aware he will be getting a call from hospital [facility].

## 2022-06-30 NOTE — Progress Notes (Signed)
[facility]  ROUNDING NOTE   Subjective:   Patient seen laying in bed, staff at bedside assisting with ADLs Alert, unable to respond to questioning No lower extremity edema Remains jaundiced with abdominal distention  Creatinine 3.45 from 2.68 yesterday Urine output 300 mL recorded overnight. Foley remains in place with dark urine.  Objective:  Vital signs in last 24 hours:  Temp:  [98 F (36.7 C)-98.7 F (37.1 C)] 98 F (36.7 C) 07/13/22 0729) Pulse Rate:  [69-81] 81 2022-07-13 0729) Resp:  [12-20] 16 07-13-2022 0729) BP: (75-141)/(44-107) 141/107 07-13-22 0729) SpO2:  [91 %-100 %] 100 % 07-13-22 0729) Weight:  [90.9 kg] 90.9 kg Jul 13, 2022 0418)  Weight change:  Filed Weights   06/15/22 0500 06/16/22 0500 2022/07/13 0418  Weight: 86.6 kg 90.2 kg 90.9 kg    Intake/Output: I/O last 3 completed shifts: In: 2947.4 [NG/GT:1045; IV Piggyback:1902.4] Out: 300 [Urine:300]   Intake/Output this shift:  No intake/output data recorded.  Physical Exam: General: NAD  Head: Normocephalic, atraumatic. Moist oral mucosal membranes  Eyes: Icteric  Lungs:  Clear to auscultation, normal effort  Heart: Regular rate and rhythm  Abdomen:  Soft, nontender, mild distention  Extremities: No peripheral edema.  Neurologic: Nonfocal, moving all four extremities  Skin: No lesions, generalized ecchymosis, jaundiced  Access: None    Basic Metabolic Panel: Recent Labs  Lab 06/14/22 0447 06/15/22 0405 06/16/22 0542 06/17/22 0926 07/13/22 0624  NA 133* 134* 134* 136 135  K 3.7 3.5 3.6 3.6 3.3*  CL 105 108 106 107 108  CO2 21* 20* 18* 19* 20*  GLUCOSE 241* 180* 205* 206* 168*  BUN 38* 47* 58* 78* 84*  CREATININE 0.73 1.62* 1.78* 2.68* 3.45*  CALCIUM 8.3* 8.0* 8.3* 8.3* 7.8*  MG 2.2 2.1 2.2 2.4 2.3  PHOS 2.2* 3.9 3.7 4.2 4.7*    Liver Function Tests: Recent Labs  Lab 06/14/22 0447 06/15/22 0405 06/16/22 0542 06/17/22 0926 07/13/2022 0624  AST 98* 114* 131* 133* 105*  ALT 39 44  54* 51* 46*  ALKPHOS 94 98 143* 154* 102  BILITOT 13.8* 17.2* 21.0* 24.1* 24.6*  PROT 6.3* 6.3* 6.5 6.3* 5.7*  ALBUMIN 1.9* 1.8* 1.8* 1.7* 2.1*   No results for input(s): "LIPASE", "AMYLASE" in the last 168 hours. No results for input(s): "AMMONIA" in the last 168 hours.  CBC: Recent Labs  Lab 06/14/22 1542 06/15/22 0405 06/16/22 0542 06/17/22 0926 2022-07-13 0624  WBC 8.5 8.0 6.8 7.4 6.0  NEUTROABS 6.3  --   --   --   --   HGB 12.3* 11.9* 13.1 12.5* 11.1*  HCT 32.6* 34.0* 37.9* 35.7* 32.0*  MCV 97 104.0* 103.8* 105.0* 104.2*  PLT 51* 57* 61* 73* 61*    Cardiac Enzymes: No results for input(s): "CKTOTAL", "CKMB", "CKMBINDEX", "TROPONINI" in the last 168 hours.  BNP: Invalid input(s): "POCBNP"  CBG: Recent Labs  Lab 06/17/22 1544 06/17/22 1959 06/17/22 2348 13-Jul-2022 0412 07/13/2022 0725  GLUCAP 150* 177* 211* 180* 159*    Microbiology: Results for orders placed or performed during the hospital encounter of 06/19/2022  MRSA Next Gen by PCR, Nasal     Status: Abnormal   Collection Time: 06/20/2022  6:34 PM   Specimen: Nasal Mucosa; Nasal Swab  Result Value Ref Range Status   MRSA by PCR Next Gen DETECTED (A) NOT DETECTED Final    Comment: CRITICAL RESULT CALLED TO, READ BACK BY AND VERIFIED WITH: DONNA HESLEP RN @0047  06/11/22 ASW (NOTE) The GeneXpert MRSA Assay (FDA  approved for NASAL specimens only), is one component of a comprehensive MRSA colonization surveillance program. It is not intended to diagnose MRSA infection nor to guide or monitor treatment for MRSA infections. Test performance is not FDA approved in patients less than 5 years old. Performed at [facility], [address] [place], [place] [address]   Culture, blood (Routine X 2) w Reflex to ID Panel     Status: Abnormal (Preliminary result)   Collection Time: 06/11/22 10:54 AM   Specimen: BLOOD  Result Value Ref Range Status   Specimen Description   Final    BLOOD Blood Culture adequate  volume Performed at [facility], [address], [place], [place] [address]    Special Requests   Final    BOTTLES DRAWN AEROBIC AND ANAEROBIC LEFT ANTECUBITAL Performed at [facility], [address] [place], [place] [address]    Culture  Setup Time   Final    AEROBIC BOTTLE ONLY BUDDING YEAST SEEN CRITICAL RESULT CALLED TO, READ BACK BY AND VERIFIED WITH: CAROLYN COULTER ON 06/14/22 AT 0752 QSD    Culture (A)  Final    CRYPTOCOCCUS NEOFORMANS Sent to [facility] for further susceptibility testing. Performed at [facility], [place] [address] [place], [place] [address]    Report Status PENDING  Incomplete  Culture, blood (Routine X 2) w Reflex to ID Panel     Status: Abnormal   Collection Time: 06/11/22 10:54 AM   Specimen: BLOOD  Result Value Ref Range Status   Specimen Description   Final    BLOOD Blood Culture adequate volume Performed at [facility], [address], [place], [place] [address]    Special Requests   Final    BOTTLES DRAWN AEROBIC AND ANAEROBIC BLOOD LEFT HAND Performed at [facility], [address], [place], [place] [address]    Culture  Setup Time   Final    BUDDING YEAST SEEN IN BOTH AEROBIC AND ANAEROBIC BOTTLES CRITICAL VALUE NOTED.  VALUE IS CONSISTENT WITH PREVIOUSLY REPORTED AND CALLED VALUE. Performed at [facility], [address] [place],  [address]    Culture CRYPTOCOCCUS NEOFORMANS (A)  Final   Report Status 06/16/2022 FINAL  Final  Blood Culture ID Panel (Reflexed)     Status: Abnormal   Collection Time: 06/11/22 10:54 AM  Result Value Ref Range Status   Enterococcus faecalis NOT DETECTED NOT DETECTED Final   Enterococcus Faecium NOT DETECTED NOT DETECTED Final   Listeria monocytogenes NOT DETECTED NOT DETECTED Final   Staphylococcus species NOT DETECTED NOT DETECTED Final   Staphylococcus aureus (BCID) NOT DETECTED NOT DETECTED Final   Staphylococcus epidermidis NOT DETECTED  NOT DETECTED Final   Staphylococcus lugdunensis NOT DETECTED NOT DETECTED Final   Streptococcus species NOT DETECTED NOT DETECTED Final   Streptococcus agalactiae NOT DETECTED NOT DETECTED Final   Streptococcus pneumoniae NOT DETECTED NOT DETECTED Final   Streptococcus pyogenes NOT DETECTED NOT DETECTED Final   A.calcoaceticus-baumannii NOT DETECTED NOT DETECTED Final   Bacteroides fragilis NOT DETECTED NOT DETECTED Final   Enterobacterales NOT DETECTED NOT DETECTED Final   Enterobacter cloacae complex NOT DETECTED NOT DETECTED Final   Escherichia coli NOT DETECTED NOT DETECTED Final   Klebsiella aerogenes NOT DETECTED NOT DETECTED Final   Klebsiella oxytoca NOT DETECTED NOT DETECTED Final   Klebsiella pneumoniae NOT DETECTED NOT DETECTED Final   Proteus species NOT DETECTED NOT DETECTED Final   Salmonella species NOT DETECTED NOT DETECTED Final  Serratia marcescens NOT DETECTED NOT DETECTED Final   Haemophilus influenzae NOT DETECTED NOT DETECTED Final   Neisseria meningitidis NOT DETECTED NOT DETECTED Final   Pseudomonas aeruginosa NOT DETECTED NOT DETECTED Final   Stenotrophomonas maltophilia NOT DETECTED NOT DETECTED Final   Candida albicans NOT DETECTED NOT DETECTED Final   Candida auris NOT DETECTED NOT DETECTED Final   Candida glabrata NOT DETECTED NOT DETECTED Final   Candida krusei NOT DETECTED NOT DETECTED Final   Candida parapsilosis NOT DETECTED NOT DETECTED Final   Candida tropicalis NOT DETECTED NOT DETECTED Final   Cryptococcus neoformans/gattii DETECTED (A) NOT DETECTED Final    Comment: CRITICAL RESULT CALLED TO, READ BACK BY AND VERIFIED WITH: CAROLYN COULTER ON 06/14/22 AT [id number] QSD Performed at [facility], [address] [place], [place] [address]   CSF culture w Gram Stain     Status: None   Collection Time: 06/13/22 10:44 AM   Specimen: CSF; Cerebrospinal Fluid  Result Value Ref Range Status   Specimen Description   Final    CSF Performed at  [facility], [address], [place], [place] [address]    Special Requests   Final    NONE Performed at [facility], [place], [place] [address]    Gram Stain   Final    WBC PRESENT,BOTH PMN AND MONONUCLEAR ENCAPSULATED YEAST CYTOSPIN SMEAR PREVIOUSLY REPORTED AS: NO ORGANISMS SEEN CORRECTED RESULTS CALLED TO: RN LUANNE HUMPHRIES ON 06/13/22 @ 2225 BY DRT Performed at [facility], [place] [address] [place], [place] [address]    Culture RARE CRYPTOCOCCUS NEOFORMANS  Final   Report Status 06/16/2022 FINAL  Final  Culture, fungus without smear     Status: Abnormal (Preliminary result)   Collection Time: 06/13/22 10:45 AM   Specimen: CSF; Cerebrospinal Fluid  Result Value Ref Range Status   Specimen Description   Final    CSF Performed at [facility], [address], [place], [place] [address]    Special Requests   Final    NONE Performed at [facility], [address], [place], [place] [address]    Culture (A)  Final    CRYPTOCOCCUS NEOFORMANS CONTINUING TO HOLD Performed at [facility], [place] [address] [place], [place] [address]    Report Status PENDING  Incomplete  Anaerobic culture w Gram Stain     Status: None (Preliminary result)   Collection Time: 06/13/22 10:45 AM   Specimen: CSF; Cerebrospinal Fluid  Result Value Ref Range Status   Specimen Description   Final    CSF Performed at [facility], [address], [place], [place] [address]    Special Requests   Final    NONE Performed at [facility], [address] [place], [place] [address]    Gram Stain   Final    WBC PRESENT, PREDOMINANTLY PMN ENCAPSULATED YEAST CRITICAL VALUE NOTED.  VALUE IS CONSISTENT WITH PREVIOUSLY REPORTED AND CALLED VALUE. Performed at [facility], [place] [address], [place], [place] [address]    Culture   Final    NO ANAEROBES ISOLATED; CULTURE IN PROGRESS FOR 5 DAYS   Report Status  PENDING  Incomplete  Culture, blood (Routine X 2) w Reflex to ID Panel     Status: None (Preliminary result)   Collection Time: 06/15/22  4:05 AM   Specimen: BLOOD  Result Value Ref Range Status   Specimen Description   Final  BLOOD BLOOD RIGHT ARM Performed at [facility], [address] [place], [place] [address]    Special Requests   Final    BOTTLES DRAWN AEROBIC AND ANAEROBIC Blood Culture results may not be optimal due to an inadequate volume of blood received in culture bottles Performed at [facility], [address] [place], [place] [address]    Culture   Final    NO GROWTH 3 DAYS Performed at [facility], [place] [address], [place], [place] [address]    Report Status PENDING  Incomplete  Culture, blood (Routine X 2) w Reflex to ID Panel     Status: None (Preliminary result)   Collection Time: 06/15/22  4:14 AM   Specimen: BLOOD  Result Value Ref Range Status   Specimen Description   Final    BLOOD BLOOD RIGHT HAND Performed at [facility], [address], [place], [place] [address]    Special Requests   Final    BOTTLES DRAWN AEROBIC AND ANAEROBIC Blood Culture results may not be optimal due to an excessive volume of blood received in culture bottles Performed at [facility], [address], [place], [place] [address]    Culture   Final    NO GROWTH 3 DAYS Performed at [facility], [place] [address] [place], [place] [address]    Report Status PENDING  Incomplete  Gastrointestinal Panel by PCR , Stool     Status: Abnormal   Collection Time: 06/15/22  5:36 PM   Specimen: Stool  Result Value Ref Range Status   Campylobacter species NOT DETECTED NOT DETECTED Final   Plesimonas shigelloides NOT DETECTED NOT DETECTED Final   Salmonella species NOT DETECTED NOT DETECTED Final   Yersinia enterocolitica DETECTED (A) NOT DETECTED Final    Comment: CRITICAL RESULT CALLED TO, READ BACK BY AND VERIFIED WITH: LUANN  HUMPRIES 06/15/2022 AT 1930 SRR    Vibrio species NOT DETECTED NOT DETECTED Final   Vibrio cholerae NOT DETECTED NOT DETECTED Final   Enteroaggregative E coli (EAEC) NOT DETECTED NOT DETECTED Final   Enteropathogenic E coli (EPEC) NOT DETECTED NOT DETECTED Final   Enterotoxigenic E coli (ETEC) NOT DETECTED NOT DETECTED Final   Shiga like toxin producing E coli (STEC) NOT DETECTED NOT DETECTED Final   Shigella/Enteroinvasive E coli (EIEC) NOT DETECTED NOT DETECTED Final   Cryptosporidium NOT DETECTED NOT DETECTED Final   Cyclospora cayetanensis NOT DETECTED NOT DETECTED Final   Entamoeba histolytica NOT DETECTED NOT DETECTED Final   Giardia lamblia NOT DETECTED NOT DETECTED Final   Adenovirus F40/41 NOT DETECTED NOT DETECTED Final   Astrovirus NOT DETECTED NOT DETECTED Final   Norovirus GI/GII NOT DETECTED NOT DETECTED Final   Rotavirus A NOT DETECTED NOT DETECTED Final   Sapovirus (I, II, IV, and V) NOT DETECTED NOT DETECTED Final    Comment: Performed at [facility], [address] [place], [place] [address]    Coagulation Studies: Recent Labs    06-27-22 0624  LABPROT 23.0*  INR 2.1*    Urinalysis: No results for input(s): "COLORURINE", "LABSPEC", "PHURINE", "GLUCOSEU", "HGBUR", "BILIRUBINUR", "KETONESUR", "PROTEINUR", "UROBILINOGEN", "NITRITE", "LEUKOCYTESUR" in the last 72 hours.  Invalid input(s): "APPERANCEUR"    Imaging: US Abdomen Limited RUQ (LIVER/GB)  Result Date: 06/16/2022 CLINICAL DATA:  Hyperbilirubinemia EXAM: ULTRASOUND ABDOMEN LIMITED RIGHT UPPER QUADRANT COMPARISON:  06/13/2022 FINDINGS: Gallbladder: Gallstones: Mild cholelithiasis Sludge: None Gallbladder Wall: Mild gallbladder wall thickening measuring up to 5 mm Pericholecystic fluid: None Sonographic  Murphy's Sign: Negative per technologist Common bile duct: Diameter: 2 mm Liver: Parenchymal echogenicity: Diffusely heterogeneous echotexture Contours: Nodular Lesions: None Portal vein: Patent.   Hepatopetal flow Other: Minimal perihepatic ascites. IMPRESSION: 1.  Cirrhotic liver morphology without focal hepatic lesion. 2. Mild cholelithiasis. Gallbladder wall thickening may be due to chronic cholecystitis or hepatitis. If there is high clinical concern for cholecystitis, further evaluation with HIDA scan should be performed. Electronically Signed   By: Miachel Roux M.D.   On: 06/16/2022 16:00     Medications:    albumin human 25 g (06-25-22 1013)   amphotericin B liposome (AMBISOME) 260 mg in dextrose 5 % 500 mL IVPB Stopped (06/25/22 0255)   chlorproMAZINE (THORAZINE) 12.5 mg in sodium chloride 0.9 % 25 mL IVPB 50 mL/hr at 06/16/22 1147   ciprofloxacin     feeding supplement (GLUCERNA 1.5 CAL) 70 mL/hr at 06/17/22 0700   sodium bicarbonate 150 mEq in dextrose 5 % 1,150 mL infusion 100 mL/hr at 2022-06-25 [id number]    Chlorhexidine Gluconate Cloth  6 each Topical Q0600   clotrimazole   Topical BID   dextrose  10 mL Intravenous Q24H   dextrose  10 mL Intravenous Q24H   flucytosine  25 mg/kg Per Tube [id number]   folic acid  1 mg Per Tube Daily   free water  30 mL Per Tube Q4H   insulin aspart  0-5 Units Subcutaneous QHS   insulin aspart  0-9 Units Subcutaneous TID WC   insulin glargine-yfgn  22 Units Subcutaneous Daily   lactulose  20 g Per Tube TID   lidocaine  1 patch Transdermal Q24H   midodrine  10 mg Per NG tube TID WC   multivitamin  15 mL Per Tube Daily   nicotine  21 mg Transdermal Daily   pantoprazole (PROTONIX) IV  40 mg Intravenous Q24H   phenol  1 spray Mouth/Throat TID   potassium chloride  20 mEq Per Tube Once   sodium chloride  500 mL Intravenous Q24H   sodium chloride  500 mL Intravenous Q24H   thiamine  100 mg Per Tube Daily   Or   thiamine  100 mg Intravenous Daily   acetaminophen, acetaminophen, chlorproMAZINE (THORAZINE) 12.5 mg in sodium chloride 0.9 % 25 mL IVPB, diphenhydrAMINE **OR** diphenhydrAMINE, hydrALAZINE, HYDROmorphone (DILAUDID) injection, liver  oil-zinc oxide, LORazepam, meperidine (DEMEROL) injection, nystatin, ondansetron (ZOFRAN) IV, oxyCODONE  Assessment/ Plan:  Mr. Johnny Kelley is a 57 y.o.  male  with past medical conditions including hyperlipidemia, diabetes, hypertension, GERD, BPH, stroke, polysubstance abuse, diastolic heart failure, liver cirrhosis and hepatocellular carcinoma, who was admitted to [facility] on 05/31/2022 for Hepatic encephalopathy ([place]) [K76.82] Hepatic cirrhosis, unspecified hepatic cirrhosis type, unspecified whether ascites present ([place]) [K74.60]   Acute kidney injury likely secondary to ATN from amphotericin B versus Hepatorenal syndrome . Stated history of liver cirrhosis and hepatocellular carcinoma. May present a hepatorenal component. Normal renal function noted on admission. Urinary retention noted, resolved with Foley catheter placement. Recommend stopping amphotericin B.  ID will evaluate reduce the dose versus stopping altogether. Continue IV fluids and albumin as ordered. Continue to recommend palliative care for goals of care discussion Due to comorbidities, patient will make a poor long-term dialysis patient.  Lab Results  Component Value Date   CREATININE 3.45 (H) 25-Jun-2022   CREATININE 2.68 (H) 06/17/2022   CREATININE 1.78 (H) 06/16/2022    Intake/Output Summary (Last 24 hours) at 2022-06-25 1108 Last data filed at 25-Jun-2022 0655 Gross per 24  hour  Intake --  Output 300 ml  Net -300 ml   2.  Acute metabolic acidosis.  Continue sodium bicarb at 100 mL/h.   3. Anemia of chronic disease, macrocytic, thrombocytopenia Lab Results  Component Value Date   HGB 11.1 (L) 2022-07-07  Hemoglobin within desired goal at this time.  Platelets continue to fluctuate, 61 today.  4.  Hypotension with history of hypertension.  Home regimen includes amlodipine, hydrochlorothiazide, losartan, and metoprolol.  All antihypertensives held.  Currently receiving midodrine.     LOS: 8    07/06/2409:08 AM

## 2022-06-30 NOTE — Consult Note (Addendum)
Pharmacy Antibiotic Note  Johnny Kelley is a 57 y.o. male admitted on 06/23/2022 with confusion. PMH significant for HTN, HLD, DM, dCHF, stroke, GERD, BPH, polysubstance abuse (alcohol, tobacco and cocaine), hepatocellular carcinoma (s/p of embolization treatment in [facility] per his brother), liver cirrhosis. On 3/15 a herpetic lesion was noticed on his upper lip, and LP with CSF studies was ordered. ME panel positive for Cryptococcus neoformans/gatti. Blood cultures from 3/13 also positive for yeast and BCID deteted Cryptococcus neoformans. Pharmacy has been consulted for amphotericin B dosing.  Today, 04-Jul-2022 Day 5 antifungal therapy  Renal: SCR up to 3.45 from 1.78 3/17 (0.61 at baseline). UOP appears decreased  K+ 3.3 - decreased Mg++ 2.3 WBC 6 afebrile LFTs: AST 105, ALT 46 --> stable/slightly improved, Tbili 24.6 (stable from 3/19). INR = 2.1 Maintenance IVF: Sodium biarb 172meq at 140ml/hr GI panel: yersinia enterolytica CD4 count = 585 Repeat blood cultures 3/17 are NGTD  Plan: Continue Liposomal Amphotericin-B 260 mg IV daily (at 2200) mg/kg/day TBW)  Give initial 500 mL NS bolus followed by D5 flush then administer AmphoB infusion over 2h After completion of AmphoB infusion, give D5 flush then 500 mL NS bolus AmphoB not compatible with NS, requires D5 flush in between AmphoB and NS administration  NS bolus required for reduction in nephrotoxicity risk Supportive medications PRN: demerol (rigors), diphenhydramine and acetaminophen (infusion reaction) Flucytosine adjusted to 2250 mg per tube q12h 3/19 based on worsening renal function. Will require further adjustment if CrCL falls below 20 ml/min No recommendations for hepatic adjustment for either medication.  Neither extensively metabolized by liver Low potassium today, give KCl 74meq per tube Decrease Ciprofloxacin 500mg  IV to q24h for Yersinia for worsening SCr on July 04, 2022 Continue to monitor renal function closely, replace Mg  and K as needed, and follow culture results  Follow-up if appropriate to add MIVF in setting of AKI (suspect volume loss from diarrhea as well)  Height: 6\' 2"  (188 cm) Weight: 90.9 kg (200 lb 6.4 oz) IBW/kg (Calculated) : 82.2  Temp (24hrs), Avg:98.4 F (36.9 C), Min:98 F (36.7 C), Max:98.7 F (37.1 C)  Recent Labs  Lab 06/11/22 1333 06/11/22 1623 06/12/22 0627 06/14/22 0447 06/14/22 1542 06/15/22 0405 06/16/22 0542 06/17/22 0926 07-04-2022 0624  WBC  --   --    < > 7.9 8.5 8.0 6.8 7.4 6.0  CREATININE  --   --    < > 0.73  --  1.62* 1.78* 2.68* 3.45*  LATICACIDVEN 3.0* 2.7*  --   --   --   --   --   --   --    < > = values in this interval not displayed.     Estimated Creatinine Clearance: 27.1 mL/min (A) (by C-G formula based on SCr of 3.45 mg/dL (H)).    No Known Allergies  Antimicrobials this admission: 3/13 Vancomycin >> 3/15 3/13 Ceftriaxone >> 3/15 3/15 Acyclovir >> 3/15 3/14 Rifaximin >> 3/16 3/15 Amphotericin B >> 3/16 Flucytosine >> 3/18 ciprofloxacin >>   Dose adjustments this admission: 3/15 Vancomycin 1500 mg IV Q12H >> Vancomycin 1250 mg IV Q12H   Microbiology results: 3/13 BCx: yeast, BCID Cryptococcus neoformans/gattii 3/12 MRSA PCR: positive 3/15 CSF Cx: encapsulated yeast consistent with other findings 3/15 CSF VRDL: Non reactive 3/15 ME Panel: Cryptococcus neoformans/gattii detected 3/17 BCx: NGTD  Thank you for allowing pharmacy to be a part of this patient's care.  Doreene Eland, PharmD, BCPS, BCIDP Work Cell: [phone] 2022/07/04 9:16 AM

## 2022-06-30 NOTE — Progress Notes (Signed)
Date of Admission:  06/15/2022     ID: Johnny Kelley is a 57 y.o. male  Principal Problem:   Cryptococcal meningitis ([place]) Active Problems:   History of CVA (cerebrovascular accident)   Alcohol abuse   Type 2 diabetes mellitus with diabetic neuropathy, without long-term current use of insulin (HCC)   BPH with obstruction/lower urinary tract symptoms   Hepatic encephalopathy (HCC)   Tobacco abuse   HTN (hypertension)   Fall at home, initial encounter   Thrombocytopenia ([place])   Hyperkalemia   Chronic diastolic CHF (congestive heart failure) (HCC)   Cirrhosis (HCC)   Back pain   Spinal stenosis of lumbar region   Malnutrition of moderate degree  Patient is obtunded    Medications:   Chlorhexidine Gluconate Cloth  6 each Topical Q0600   clotrimazole   Topical BID   dextrose  10 mL Intravenous Q24H   dextrose  10 mL Intravenous Q24H   flucytosine  25 mg/kg Per Tube [id number]   folic acid  1 mg Per Tube Daily   free water  30 mL Per Tube Q4H   insulin aspart  0-5 Units Subcutaneous QHS   insulin aspart  0-9 Units Subcutaneous TID WC   insulin glargine-yfgn  22 Units Subcutaneous Daily   lactulose  20 g Per Tube TID   lidocaine  1 patch Transdermal Q24H   midodrine  10 mg Per NG tube TID WC   multivitamin  15 mL Per Tube Daily   nicotine  21 mg Transdermal Daily   pantoprazole (PROTONIX) IV  40 mg Intravenous Q24H   phenol  1 spray Mouth/Throat TID   potassium chloride  20 mEq Per Tube Once   sodium chloride  500 mL Intravenous Q24H   sodium chloride  500 mL Intravenous Q24H   thiamine  100 mg Per Tube Daily   Or   thiamine  100 mg Intravenous Daily    Objective: Vital signs in last 24 hours: Patient Vitals for the past 24 hrs:  BP Temp Temp src Pulse Resp SpO2 Weight  12-Jul-2022 1114 (!) 81/44 (!) 97.4 F (36.3 C) Oral 76 18 95 % --  07-12-22 0729 (!) 141/107 98 F (36.7 C) -- 81 16 100 % --  07-12-2022 0418 -- -- -- -- -- -- 90.9 kg  12-Jul-2022 0415 (!) 89/48 --  -- 79 20 95 % --  06/17/22 2353 (!) 89/52 -- -- 81 18 91 % --  06/17/22 2006 (!) 93/49 -- -- 75 18 94 % --  06/17/22 1653 98/60 -- -- 69 18 -- --  06/17/22 1546 (!) 78/44 -- -- 70 12 93 % --    LDA NG tube  PHYSICAL EXAM:  General: obtunded no response to questions  Eyes: Icteric   Lungs: Bilateral air entry  heart: Regular rate and rhythm, no murmur, rub or gallop. Abdomen: Soft, non-tender,not distended. Bowel sounds normal. No masses Extremities: fading bruises Skin:  Lymph: Cervical, supraclavicular normal. Neurologic: Cannot  examine in detail Lab Results Recent Labs    06/17/22 0926 07-12-22 0624  WBC 7.4 6.0  HGB 12.5* 11.1*  HCT 35.7* 32.0*  NA 136 135  K 3.6 3.3*  CL 107 108  CO2 19* 20*  BUN 78* 84*  CREATININE 2.68* 3.45*   Liver Panel Recent Labs    06/17/22 0926 12-Jul-2022 0624  PROT 6.3* 5.7*  ALBUMIN 1.7* 2.1*  AST 133* 105*  ALT 51* 46*  ALKPHOS 154* 102  BILITOT 24.1*  24.6*  BILIDIR 14.3* 12.9*  IBILI 9.8* NOT CALCULATED  Microbiology: 06/11/2022 blood cultures Cryptococcus neoformans/ CSF 06/13/2022 cryptococcus  06/15/2022 blood culture no growth so far Studies/Results: US Abdomen Limited RUQ (LIVER/GB)  Result Date: 06/16/2022 CLINICAL DATA:  Hyperbilirubinemia EXAM: ULTRASOUND ABDOMEN LIMITED RIGHT UPPER QUADRANT COMPARISON:  06/13/2022 FINDINGS: Gallbladder: Gallstones: Mild cholelithiasis Sludge: None Gallbladder Wall: Mild gallbladder wall thickening measuring up to 5 mm Pericholecystic fluid: None Sonographic Murphy's Sign: Negative per technologist Common bile duct: Diameter: 2 mm Liver: Parenchymal echogenicity: Diffusely heterogeneous echotexture Contours: Nodular Lesions: None Portal vein: Patent.  Hepatopetal flow Other: Minimal perihepatic ascites. IMPRESSION: 1.  Cirrhotic liver morphology without focal hepatic lesion. 2. Mild cholelithiasis. Gallbladder wall thickening may be due to chronic cholecystitis or hepatitis. If there is  high clinical concern for cholecystitis, further evaluation with HIDA scan should be performed. Electronically Signed   By: Miachel Roux M.D.   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  Started on bicarb drip Will repeat Cr this evening at 7pm And if high will dc amphotericin and start fluconazole   Yersinia enterocolitica gastroenteritis.  Currently on cipro  recommend  CT abdomen to look for any intestinal lesions or liver abscesses or thrombosis as risk for complication from Yersinia and with bilirubin worsening.Marland Kitchenand creatinine worsening   Thrombocytopenia  Hyperbilirubinemia worsening, recommend  CT abdomen to look for any intestinal lesions or liver abscesses or vein thrombosis as risk for complication from Yersinia   Hep C low-level viremia Cirrhosis liver  Pigmented verrucous lesions in the mons pubic area.  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

## 2022-06-30 NOTE — Discharge Summary (Addendum)
[facility] Discharge Summary   Death Certificate case ID# [id number]   Patient: Johnny Kelley [id number]  PCP: Venita Lick, NP  Date of admission: 06/09/2022   Date of discharge: June 29, 2022     Discharge Diagnoses:  Principal diagnosis Principal Problem:   Cryptococcal meningitis ([place]) Active Problems:   Hepatic encephalopathy ([place])   Cirrhosis ([place])   Fall at home, initial encounter   Hyperkalemia   HTN (hypertension)   History of CVA (cerebrovascular accident)   Chronic diastolic CHF (congestive heart failure) ([place])   Type 2 diabetes mellitus with diabetic neuropathy, without long-term current use of insulin (HCC)   BPH with obstruction/lower urinary tract symptoms   Thrombocytopenia (HCC)   Alcohol abuse   Tobacco abuse   Back pain   Spinal stenosis of lumbar region   Malnutrition of moderate degree   Admitted From: Home Disposition:  Deceased  Date of Death: 30-Jul-2022 Time of Death: 19:16 military time Cause of Death: Cryptococcal meningitis  Death Certificate case ID# [id number]  Recommendations for Outpatient Follow-up:  PCP: Deceased Follow up LABS/TEST:  Deceased   Diet recommendation: Deceased  Activity: Deceased  Discharge Condition: Deceased  Code Status: DNR   History of present illness: As per the H and P dictated on admission Hospital Course:  Johnny Kelley is a 57 y.o. male with medical history significant of hypertension, hyperlipidemia, diabetes mellitus, diastolic CHF, stroke, GERD, BPH, polysubstance abuse (alcohol, tobacco and cocaine), hepatocellular carcinoma (s/p of embolization treatment in [facility] per his brother), liver cirrhosis, who presents with altered mental status, fall and back pain. Per his brother (I called his brother by phone), patient has chronic back pain with abnormal MRI findings. Pt has an appointment with neurosurgeon, Dr. Cari Caraway on 3/14.  Due to back pain, patient has difficulty walking and  decreased mobility.  It is very difficult for his brother to take care of him at home. Patient has been confused in the past several days. In the ED  patient was very lethargic, mildly confused, but still orientated x 3.  He did complain of mid and lower back pain, which is constant, severe, nonradiating.  He also has bilateral leg weakness.      Assessment and Plan:   # Cryptococcal neoformans meningitis, POA Hepatic encephalopathy ruled out. Patient presented with altered mental status, Ammonia level 36-18.  CT head negative.  UDS negative. S/p Lactulose 20 g 3 times daily via NG tube, s/p lactulose enema d/c'd. Patient had fever, empirically patient received ceftriaxone and  vancomycin which were discontinued after diagnosis of cryptococcal meningitis.  DC rifaximin as well. On 3/15 herpetic lesion noticed on upper lip, may be viral encephalitis causing AMS Lumbar puncture ordered. S/p acyclovir IV was given which was discontinued after diagnosis of cryptococcal meningitis. [place] abd was done for paracentesis but there was no fluid to Tap, so could not r/o SBP. On 3/13 blood culture positive for Cryptococcus neoformans/gattii  3/15 CSF shows cryptococcal antigen titer 2560, CSF WBC 148-212, glucose 72, RBC 31, protein 195. On 3/15 NG tube was inserted for feeding, dietitian consulted.On 3/15 started amphotericin B and flucytosine, pharmacy consulted for dosing. On 3/17 repeat blood cultures NGTD. ID consulted for further recommendation. TOC for placement.  But patient could not make it and passed away on 06-29-2022. 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 On 3/18 started ciprofloxacin 400 IV twice daily # AKI most likely ATN due to amphotericin B. Hypotension, dehydration and diarrhea could be contributing factors.  S/p IV fluid for gentle hydration. Urine retention, Foley catheter was inserted on 06/14/2022. Cr 1.78 --2.68--3.45. Nephrology consulted, s/p bicarbonate infusion,  midodrine, IV albumin and IVF to prevent prerenal azotemia.  But patient could not make it. # Hyperbilirubinemia secondary to sepsis. AST 131 elevated, ALT 54. Total bilirubin 24, DB 14.3 and IB 9.8. [place] abd: Cirrhotic liver morphology without focal hepatic lesion.  Mild cholelithiasis. Gallbladder wall thickening may be due to chronic cholecystitis or hepatitis. If there is high clinical concern for cholecystitis, further evaluation with HIDA scan should be performed. HIDA scan: Unable to evaluate gallbladder filling due to poor excretion by the liver related patient hepatic dysfunction and bilirubin equal 24. Faint counts are evident within the small bowel consistent patent common bile duct. CT A/p was ordered and after CT scan patient's condition deteriorated and he passed away late in the evening. # Cirrhosis ([place]): Elevated LFTs, Elevated bilirubin. Coagulopathy, INR 2.1 on 3/22 to hepatic failure # Fall at home, initial encounter: CT head and neck negative # Hyperkalemia: Resolved. S/p Leukoma 5 g and s/p 40 mg Lasix due to elevated BNP # Hypokalemia, potassium 3.3  # Hypophosphatemia, Phos repleted. # Hyperphosphatemia due to AKI # HTN (hypertension)D/c'd Amlodipine, HCTZ, metoprolol, due to hypotension Hold losartan due to hyperkalemia. S/p NS 500 mL bolus given. 3/16 started midodrine 10 mg p.o. 3 times daily  3/15 SVT 6 beats followed by 16 beats, patient was unable to offer any complaint due to encephalopathy.  Lopressor 5 mg IV 1 dose given. mg 1.9 wnl 3/16 s/p metoprolol 5 mg IV TID, DC'd on 3/19 due to low blood pressure We will continue to monitor BP and titrate medications accordingly 3/19 NS 500 mL bolus ordered # History of CVA (cerebrovascular accident), Patient was not taking Plavix due to easy bleeding # Chronic diastolic CHF (congestive heart failure) ([place]): 2D echo on 06/24/2021 showed EF of 60 to 65% with grade 1 diastolic dysfunction.  BNP is elevated 418 and chest x-ray with  vascular crowding, but patient only has trace leg edema, no JVT, no shortness of breath or oxygen desaturation.  Does not seem to have CHF exacerbation.  Patient was at risk of developing CHF exacerbation. -s/p 1 dose of Lasix to 40 mg x 1, Held HCTZ due to low blood pressure # Type 2 diabetes mellitus with diabetic neuropathy, without long-term current use of insulin ([place]): Recent A1c 5.7, well-controlled.  Patient taking Trulicity, metformin and Jardiance. S/p Sliding scale insulin. On 3/15 increased Semglee 22 units daily # BPH with obstruction/lower urinary tract symptoms. Plan was to Resume Flomax when able to take oral medications # Thrombocytopenia ([place]): this is a chronic issue due to liver cirrhosis. Plts 38--48--46--61 # Alcohol abuse and Tobacco abuse, s/p CIWA protocol and Nicotine patch # Back pain: Patient has chronic mid and lower back pain, with abnormal MRI findings as above.  Patient has an appointment with Dr. Cari Caraway of neurosurgery on 3/14. Consulted Dr. Cari Caraway for neurosurgery, recommended no intervention until his mental status improves.  Body mass index is 25.73 kg/m.  Nutrition Problem: Moderate Malnutrition Etiology: chronic illness (cirrhosis) Nutrition Interventions: Interventions: Tube feeding, MVI, Ensure Enlive (each supplement provides 350kcal and 20 grams of protein)   Patient was in critical condition and at risk to deteriorate at any time.  Palliative care was involved.  Patient was DNR/DNI.  Informed to patient's brother over the phone that his condition is deteriorating and he may not make it so he agreed to discontinue  all the medication and start comfort measures only.  Patient expired after few hours late in the evening on 06/19/2022 at 19:16 military time.  Death was pronounced by RN and on-call nurse petitioner.  I was not available to examine the patient at that time.    Consultants: ID, GI, Nephrology, Palliative care Procedures: Lumbar  Puncture  Discharge Exam: Patient expired late in the evening, so I was not able to examine the patient, patient was seen in the morning around on 06-19-22  Filed Weights   06/15/22 0500 06/16/22 0500 06-19-22 0418  Weight: 86.6 kg 90.2 kg 90.9 kg   Vitals:   06-19-2022 1635 06/19/2022 1855  BP: (!) 94/48 (!) 65/39  Pulse:  66  Resp:  11  Temp:    SpO2:  (!) 89%    DISCHARGE MEDICATION: Allergies as of 06/19/22   No Known Allergies      Medication List     ASK your doctor about these medications    Deceased   amLODipine 2.5 MG tablet Commonly known as: NORVASC Take 1 tablet (2.5 mg total) by mouth daily.   blood glucose meter kit and supplies Kit Dispense based on patient and insurance preference. Use up to four times daily as directed. (FOR ICD-9 250.00, 250.01).   clopidogrel 75 MG tablet Commonly known as: PLAVIX clopidogrel 75 mg tablet  TAKE 1 TABLET BY MOUTH EVERY DAY   clotrimazole 1 % cream Commonly known as: LOTRIMIN clotrimazole 1 % topical cream   famotidine 20 MG tablet Commonly known as: PEPCID TAKE 1 TABLET BY MOUTH EVERYDAY AT BEDTIME   gabapentin 100 MG capsule Commonly known as: NEURONTIN Take 1 capsule (100 mg total) by mouth daily.   hydrochlorothiazide 25 MG tablet Commonly known as: HYDRODIURIL Take 1 tablet (25 mg total) by mouth daily.   Jardiance 25 MG Tabs tablet Generic drug: empagliflozin Take 25 mg by mouth daily.   lidocaine 5 % Commonly known as: Lidoderm Place 1 patch onto the skin daily. Remove & Discard patch within 12 hours or as directed by MD   losartan 100 MG tablet Commonly known as: COZAAR Take 1 tablet (100 mg total) by mouth daily.   meloxicam 7.5 MG tablet Commonly known as: MOBIC Take 7.5 mg by mouth daily.   metFORMIN 1000 MG tablet Commonly known as: GLUCOPHAGE TAKE 1 TABLET BY MOUTH EVERY DAY   metoprolol succinate 25 MG 24 hr tablet Commonly known as: Toprol XL Take 1 tablet (25 mg total) by  mouth daily.   tamsulosin 0.4 MG Caps capsule Commonly known as: FLOMAX Take 1 capsule (0.4 mg total) by mouth daily.   terbinafine 250 MG tablet Commonly known as: LamISIL Take 1 tablet (250 mg total) by mouth daily.   tiZANidine 4 MG tablet Commonly known as: ZANAFLEX Take 4 mg by mouth every 6 (six) hours as needed.   Toviaz 8 MG Tb24 tablet Generic drug: fesoterodine Take 8 mg by mouth daily.   Trulicity [id number] 0000000 Sopn Generic drug: Dulaglutide Inject 0.75 mg into the skin once a week. On Monday   Trulicity 1.5 0000000 Sopn Generic drug: Dulaglutide Inject 1.5 mg into the skin once a week.       No Known Allergies   The results of significant diagnostics from this hospitalization (including imaging, microbiology, ancillary and laboratory) are listed below for reference.    Significant Diagnostic Studies: CT ABDOMEN PELVIS WO CONTRAST  Result Date: June 19, 2022 CLINICAL DATA:  Sepsis. Yersinia infection with rising bilirubin  levels. History of hepatocellular carcinoma with embolization. EXAM: CT ABDOMEN AND PELVIS WITHOUT CONTRAST TECHNIQUE: Multidetector CT imaging of the abdomen and pelvis was performed following the standard protocol without IV contrast. RADIATION DOSE REDUCTION: This exam was performed according to the departmental dose-optimization program which includes automated exposure control, adjustment of the mA and/or kV according to patient size and/or use of iterative reconstruction technique. COMPARISON:  Abdominopelvic CT 05/18/2020. Nuclear medicine hepatobiliary scan 07/14/22 and abdominal ultrasound 06/16/2022. Abdominal MRI 05/06/2022. FINDINGS: Lower chest: The heart is mildly enlarged. There is aortic and coronary artery atherosclerosis. No significant pericardial fluid. Trace pleural effusions with new dependent opacities at both lung bases. Hepatobiliary: Diffuse morphologic changes of cirrhosis are again noted with progressive contraction of the  liver compared with previous abdominal CT. The treated lesion posteriorly in the right hepatic lobe demonstrates peripheral calcification and is not well seen on this noncontrast study, but shows no definite enlargement. No new liver lesions are identified. There are multiple calcified gallstones. There is mild gallbladder wall thickening and pericholecystic soft tissue stranding. No significant biliary dilatation. Pancreas: Unremarkable. No pancreatic ductal dilatation or surrounding inflammatory changes. Spleen: Normal in size without focal abnormality. Adrenals/Urinary Tract: Both adrenal glands appear normal. Punctate nonobstructing bilateral renal calculi. No evidence of ureteral calculus or hydronephrosis. The bladder is decompressed by a Foley catheter. Stomach/Bowel: Enteric tube extends into the mid stomach. Enteric contrast has passed into the distal small bowel. There is mild diffuse nonspecific wall thickening of the stomach, small bowel and colon, likely related to the patient's liver disease. No significant bowel distension or focal abnormality is identified. The appendix appears normal. There is no evidence of contrast extravasation. Rectal tube in place. Vascular/Lymphatic: There are no enlarged abdominal or pelvic lymph nodes. There are scattered small retroperitoneal lymph nodes. Diffuse aortic and branch vessel atherosclerosis without evidence of aneurysm. Reproductive: The prostate gland and seminal vesicles appear unremarkable. Other: A moderate amount of ascites has developed. There is no focal extraluminal fluid collection or pneumoperitoneum. Generalized edema throughout the mesenteric fat. Musculoskeletal: No acute or significant osseous findings. Mild spondylosis. IMPRESSION: 1. Progressive morphologic changes of hepatic cirrhosis with progressive contraction of the liver. The treated lesion posteriorly in the right hepatic lobe is not well seen on this noncontrast study, but shows no  definite enlargement. No new liver lesions are identified. 2. Interval development of moderate ascites, diffuse mesenteric edema and small pleural effusions consistent with anasarca. 3. Cholelithiasis with mild gallbladder wall thickening and pericholecystic soft tissue stranding, likely related to the patient's liver disease. As on recent prior imaging, acute cholecystitis not excluded. 4. Diffuse nonspecific bowel wall thickening, also likely related to the patient's liver disease. No evidence of bowel obstruction or perforation. 5. Nonobstructing bilateral renal calculi. 6.  Aortic Atherosclerosis (ICD10-I70.0). Electronically Signed   By: Richardean Sale M.D.   On: 07-14-2022 18:49   NM Hepatobiliary Liver Func  Result Date: Jul 14, 2022 CLINICAL DATA:  Gallbladder wall thickening identified on ultrasound. Patient with elevated bilirubin of 24.6. No evidence of biliary obstruction on ultrasound. Patient with alcoholic cirrhosis. EXAM: NUCLEAR MEDICINE HEPATOBILIARY IMAGING TECHNIQUE: Sequential images of the abdomen were obtained out to 60 minutes following intravenous administration of radiopharmaceutical. RADIOPHARMACEUTICALS:  10.0 mCi Tc-49m  Choletec IV COMPARISON:  0 ultrasound 06/16/2022 FINDINGS: There was poor extraction of radiotracer from the blood pool. At 90 imaging in there remains significant activity in the blood pool. Blood pool activity in the heart is greater than uptake in the liver. This  is a function patient's high bilirubin level. At 90 minutes of imaging there is faint activity noted in the bowel indicating patent common bile duct. Insufficient radiotracer is excreted to evaluate the gallbladder. IMPRESSION: 1. Unable to evaluate gallbladder filling due to poor excretion by the liver related patient hepatic dysfunction and bilirubin equal 24. 2. Faint counts are evident within the small bowel consistent patent common bile duct. Electronically Signed   By: Suzy Bouchard M.D.   On:  30-Jun-2022 15:12   US Abdomen Limited RUQ (LIVER/GB)  Result Date: 06/16/2022 CLINICAL DATA:  Hyperbilirubinemia EXAM: ULTRASOUND ABDOMEN LIMITED RIGHT UPPER QUADRANT COMPARISON:  06/13/2022 FINDINGS: Gallbladder: Gallstones: Mild cholelithiasis Sludge: None Gallbladder Wall: Mild gallbladder wall thickening measuring up to 5 mm Pericholecystic fluid: None Sonographic Murphy's Sign: Negative per technologist Common bile duct: Diameter: 2 mm Liver: Parenchymal echogenicity: Diffusely heterogeneous echotexture Contours: Nodular Lesions: None Portal vein: Patent.  Hepatopetal flow Other: Minimal perihepatic ascites. IMPRESSION: 1.  Cirrhotic liver morphology without focal hepatic lesion. 2. Mild cholelithiasis. Gallbladder wall thickening may be due to chronic cholecystitis or hepatitis. If there is high clinical concern for cholecystitis, further evaluation with HIDA scan should be performed. Electronically Signed   By: Miachel Roux M.D.   On: 06/16/2022 16:00   DG Abd 1 View  Result Date: 06/14/2022 CLINICAL DATA:  NG tube placement EXAM: ABDOMEN - 1 VIEW COMPARISON:  06/13/2022 FINDINGS: Esophagogastric tube with tip and side port below the diaphragm. Nonobstructive pattern of included bowel gas. No free air. IMPRESSION: Esophagogastric tube with tip and side port below the diaphragm. Electronically Signed   By: Delanna Ahmadi M.D.   On: 06/14/2022 12:24   DG Abd 1 View  Result Date: 06/13/2022 CLINICAL DATA:  Nasogastric tube placement. EXAM: ABDOMEN - 1 VIEW COMPARISON:  CT abdomen pelvis dated 05/18/2020. FINDINGS: The bowel gas pattern is normal. An enteric tube terminates in the stomach with the side port near the gastroesophageal junction. IMPRESSION: An enteric tube terminates in the stomach with the side port near the gastroesophageal junction. Electronically Signed   By: Zerita Boers M.D.   On: 06/13/2022 16:58   DG FL GUIDED LUMBAR PUNCTURE  Result Date: 06/13/2022 CLINICAL DATA:  Patient  admitted with hepatic encephalopathy and altered mental status. EXAM: DIAGNOSTIC LUMBAR PUNCTURE UNDER FLUOROSCOPIC GUIDANCE COMPARISON:  None Available. FLUOROSCOPY: Radiation Exposure Index (as provided by the fluoroscopic device): 13.10 mGy Kerma PROCEDURE: Informed consent was obtained from the patient's brother prior to the procedure, including potential complications of headache, allergy, and pain. With the patient prone, the lower back was prepped with Betadine. 1% Lidocaine was used for local anesthesia. Lumbar puncture was performed at the L5/S1 level using a 20 gauge needle with return of yellow/orange CSF. Opening pressure unable to be obtained due to patient being restless on the procedure table. 11 ml of CSF were obtained for laboratory studies. The patient tolerated the procedure well and there were no apparent complications. IMPRESSION: Technically successful L5/S1 lumbar puncture yielding 11 mL of yellow/orange CSF for laboratory studies. Opening pressure not obtained due to patient's restlessness. Performed by: Soyla Dryer, NP Electronically Signed   By: Marin Roberts M.D.   On: 06/13/2022 16:18   [place] ASCITES (ABDOMEN LIMITED)  Result Date: 06/13/2022 CLINICAL DATA:  57 year old male with history of cirrhosis. EXAM: LIMITED ABDOMEN ULTRASOUND FOR ASCITES TECHNIQUE: Limited ultrasound survey for ascites was performed in all four abdominal quadrants. COMPARISON:  None Available. FINDINGS: No evidence of ascites in all 4 quadrants. IMPRESSION:  No ascites.  No paracentesis was performed. Ruthann Cancer, MD [facility] Electronically Signed   By: Ruthann Cancer M.D.   On: 06/13/2022 15:11   DG Chest Portable 1 View  Result Date: 06/04/2022 CLINICAL DATA:  Weakness and altered mental status. EXAM: PORTABLE CHEST 1 VIEW COMPARISON:  Chest x-ray 09/24/2018 FINDINGS: The cardiac silhouette, mediastinal and hilar contours are within normal  limits. Low lung volumes with vascular crowding and streaky atelectasis but no infiltrates, edema or effusions. No pneumothorax. The bony thorax is intact. IMPRESSION: Low lung volumes with vascular crowding and streaky atelectasis. Electronically Signed   By: Marijo Sanes M.D.   On: 06/03/2022 12:41   CT HEAD WO CONTRAST (5MM)  Result Date: 06/17/2022 CLINICAL DATA:  Mental status change with unknown cause EXAM: CT HEAD WITHOUT CONTRAST CT CERVICAL SPINE WITHOUT CONTRAST TECHNIQUE: Multidetector CT imaging of the head and cervical spine was performed following the standard protocol without intravenous contrast. Multiplanar CT image reconstructions of the cervical spine were also generated. RADIATION DOSE REDUCTION: This exam was performed according to the departmental dose-optimization program which includes automated exposure control, adjustment of the mA and/or kV according to patient size and/or use of iterative reconstruction technique. COMPARISON:  10/10/2019 brain MRI FINDINGS: CT HEAD FINDINGS Brain: No evidence of acute infarction, hemorrhage, hydrocephalus, extra-axial collection or mass lesion/mass effect. Generalized atrophy. Chronic small vessel ischemia in the cerebral white matter that is extensive. Vascular: Atheromatous calcification and accentuated vessel tortuosity. No hyperdense vessel. Skull: Normal. Negative for fracture or focal lesion. Sinuses/Orbits: No acute finding Other: Intermittent motion artifact. CT CERVICAL SPINE FINDINGS Alignment: Normal. Skull base and vertebrae: No acute fracture. No primary bone lesion or focal pathologic process. Soft tissues and spinal canal: No prevertebral fluid or swelling. No visible canal hematoma. Disc levels:  No significant degenerative changes. Upper chest: No evidence of injury. IMPRESSION: 1. No acute intracranial or cervical spine finding. 2. Premature atrophy and chronic small vessel disease. Electronically Signed   By: Jorje Guild M.D.    On: 06/11/2022 12:22   CT Cervical Spine Wo Contrast  Result Date: 06/17/2022 CLINICAL DATA:  Mental status change with unknown cause EXAM: CT HEAD WITHOUT CONTRAST CT CERVICAL SPINE WITHOUT CONTRAST TECHNIQUE: Multidetector CT imaging of the head and cervical spine was performed following the standard protocol without intravenous contrast. Multiplanar CT image reconstructions of the cervical spine were also generated. RADIATION DOSE REDUCTION: This exam was performed according to the departmental dose-optimization program which includes automated exposure control, adjustment of the mA and/or kV according to patient size and/or use of iterative reconstruction technique. COMPARISON:  10/10/2019 brain MRI FINDINGS: CT HEAD FINDINGS Brain: No evidence of acute infarction, hemorrhage, hydrocephalus, extra-axial collection or mass lesion/mass effect. Generalized atrophy. Chronic small vessel ischemia in the cerebral white matter that is extensive. Vascular: Atheromatous calcification and accentuated vessel tortuosity. No hyperdense vessel. Skull: Normal. Negative for fracture or focal lesion. Sinuses/Orbits: No acute finding Other: Intermittent motion artifact. CT CERVICAL SPINE FINDINGS Alignment: Normal. Skull base and vertebrae: No acute fracture. No primary bone lesion or focal pathologic process. Soft tissues and spinal canal: No prevertebral fluid or swelling. No visible canal hematoma. Disc levels:  No significant degenerative changes. Upper chest: No evidence of injury. IMPRESSION: 1. No acute intracranial or cervical spine finding. 2. Premature atrophy and chronic small vessel disease. Electronically Signed   By: Jorje Guild M.D.   On: 06/19/2022 12:22    Microbiology: Recent Results (from the  past 240 hour(s))  MRSA Next Gen by PCR, Nasal     Status: Abnormal   Collection Time: 06/14/2022  6:34 PM   Specimen: Nasal Mucosa; Nasal Swab  Result Value Ref Range Status   MRSA by PCR Next Gen DETECTED  (A) NOT DETECTED Final    Comment: CRITICAL RESULT CALLED TO, READ BACK BY AND VERIFIED WITH: DONNA HESLEP RN @0047  06/11/22 ASW (NOTE) The GeneXpert MRSA Assay (FDA approved for NASAL specimens only), is one component of a comprehensive MRSA colonization surveillance program. It is not intended to diagnose MRSA infection nor to guide or monitor treatment for MRSA infections. Test performance is not FDA approved in patients less than 53 years old. Performed at [facility], [address] [place], [place] [address]   Culture, blood (Routine X 2) w Reflex to ID Panel     Status: Abnormal (Preliminary result)   Collection Time: 06/11/22 10:54 AM   Specimen: BLOOD  Result Value Ref Range Status   Specimen Description   Final    BLOOD Blood Culture adequate volume Performed at [facility], [address], [place], [place] [address]    Special Requests   Final    BOTTLES DRAWN AEROBIC AND ANAEROBIC LEFT ANTECUBITAL Performed at [facility], [address] [place], [place] [address]    Culture  Setup Time   Final    AEROBIC BOTTLE ONLY BUDDING YEAST SEEN CRITICAL RESULT CALLED TO, READ BACK BY AND VERIFIED WITH: CAROLYN COULTER ON 06/14/22 AT 0752 QSD    Culture (A)  Final    CRYPTOCOCCUS NEOFORMANS Sent to [place] for further susceptibility testing. Performed at [facility], [place] [address], [place], [place] [address]    Report Status PENDING  Incomplete  Culture, blood (Routine X 2) w Reflex to ID Panel     Status: Abnormal   Collection Time: 06/11/22 10:54 AM   Specimen: BLOOD  Result Value Ref Range Status   Specimen Description   Final    BLOOD Blood Culture adequate volume Performed at [facility], [address] [place], [place] [address]    Special Requests   Final    BOTTLES DRAWN AEROBIC AND ANAEROBIC BLOOD LEFT HAND Performed at [facility], [address] [place], [place] [address]    Culture  Setup  Time   Final    BUDDING YEAST SEEN IN BOTH AEROBIC AND ANAEROBIC BOTTLES CRITICAL VALUE NOTED.  VALUE IS CONSISTENT WITH PREVIOUSLY REPORTED AND CALLED VALUE. Performed at [facility], [address] [place],  [address]    Culture CRYPTOCOCCUS NEOFORMANS (A)  Final   Report Status 06/16/2022 FINAL  Final  Blood Culture ID Panel (Reflexed)     Status: Abnormal   Collection Time: 06/11/22 10:54 AM  Result Value Ref Range Status   Enterococcus faecalis NOT DETECTED NOT DETECTED Final   Enterococcus Faecium NOT DETECTED NOT DETECTED Final   Listeria monocytogenes NOT DETECTED NOT DETECTED Final   Staphylococcus species NOT DETECTED NOT DETECTED Final   Staphylococcus aureus (BCID) NOT DETECTED NOT DETECTED Final   Staphylococcus epidermidis NOT DETECTED NOT DETECTED Final   Staphylococcus lugdunensis NOT DETECTED NOT DETECTED Final   Streptococcus species NOT DETECTED NOT DETECTED Final   Streptococcus agalactiae NOT DETECTED NOT DETECTED Final   Streptococcus pneumoniae NOT DETECTED NOT DETECTED Final   Streptococcus pyogenes NOT DETECTED NOT DETECTED Final   A.calcoaceticus-baumannii NOT DETECTED NOT DETECTED Final   Bacteroides fragilis NOT DETECTED NOT  DETECTED Final   Enterobacterales NOT DETECTED NOT DETECTED Final   Enterobacter cloacae complex NOT DETECTED NOT DETECTED Final   Escherichia coli NOT DETECTED NOT DETECTED Final   Klebsiella aerogenes NOT DETECTED NOT DETECTED Final   Klebsiella oxytoca NOT DETECTED NOT DETECTED Final   Klebsiella pneumoniae NOT DETECTED NOT DETECTED Final   Proteus species NOT DETECTED NOT DETECTED Final   Salmonella species NOT DETECTED NOT DETECTED Final   Serratia marcescens NOT DETECTED NOT DETECTED Final   Haemophilus influenzae NOT DETECTED NOT DETECTED Final   Neisseria meningitidis NOT DETECTED NOT DETECTED Final   Pseudomonas aeruginosa NOT DETECTED NOT DETECTED Final   Stenotrophomonas maltophilia NOT DETECTED NOT  DETECTED Final   Candida albicans NOT DETECTED NOT DETECTED Final   Candida auris NOT DETECTED NOT DETECTED Final   Candida glabrata NOT DETECTED NOT DETECTED Final   Candida krusei NOT DETECTED NOT DETECTED Final   Candida parapsilosis NOT DETECTED NOT DETECTED Final   Candida tropicalis NOT DETECTED NOT DETECTED Final   Cryptococcus neoformans/gattii DETECTED (A) NOT DETECTED Final    Comment: CRITICAL RESULT CALLED TO, READ BACK BY AND VERIFIED WITH: CAROLYN COULTER ON 06/14/22 AT [id number] QSD Performed at [facility], [address] [place], [place] [address]   CSF culture w Gram Stain     Status: None   Collection Time: 06/13/22 10:44 AM   Specimen: CSF; Cerebrospinal Fluid  Result Value Ref Range Status   Specimen Description   Final    CSF Performed at [facility], [address] [place], [place] [address]    Special Requests   Final    NONE Performed at [facility], [address] [place], [place] [address]    Gram Stain   Final    WBC PRESENT,BOTH PMN AND MONONUCLEAR ENCAPSULATED YEAST CYTOSPIN SMEAR PREVIOUSLY REPORTED AS: NO ORGANISMS SEEN CORRECTED RESULTS CALLED TO: RN LUANNE HUMPHRIES ON 06/13/22 @ 2225 BY DRT Performed at [facility], [address], [place], [place] [address]    Culture RARE CRYPTOCOCCUS NEOFORMANS  Final   Report Status 06/16/2022 FINAL  Final  Culture, fungus without smear     Status: Abnormal (Preliminary result)   Collection Time: 06/13/22 10:45 AM   Specimen: CSF; Cerebrospinal Fluid  Result Value Ref Range Status   Specimen Description   Final    CSF Performed at [facility], [address], [place], [place] [address]    Special Requests   Final    NONE Performed at [facility], [address] [place], [place] [address]    Culture (A)  Final    CRYPTOCOCCUS NEOFORMANS CONTINUING TO HOLD Performed at [facility], [place] [address] [place], [place] [address]     Report Status PENDING  Incomplete  Anaerobic culture w Gram Stain     Status: None   Collection Time: 06/13/22 10:45 AM   Specimen: CSF; Cerebrospinal Fluid  Result Value Ref Range Status   Specimen Description   Final    CSF Performed at [facility], [address], [place], [place] [address]    Special Requests   Final    NONE Performed at [facility], [address] [place], [place] [address]    Gram Stain   Final    WBC PRESENT, PREDOMINANTLY PMN ENCAPSULATED YEAST CRITICAL VALUE NOTED.  VALUE IS CONSISTENT WITH PREVIOUSLY REPORTED AND CALLED VALUE.    Culture   Final    NO ANAEROBES  ISOLATED Performed at [facility], [place] [address], [place], [place] [address]    Report Status 07/04/2022 FINAL  Final  Culture, blood (Routine X 2) w Reflex to ID Panel     Status: None (Preliminary result)   Collection Time: 06/15/22  4:05 AM   Specimen: BLOOD  Result Value Ref Range Status   Specimen Description   Final    BLOOD BLOOD RIGHT ARM Performed at [facility], [address] [place], [place] [address]    Special Requests   Final    BOTTLES DRAWN AEROBIC AND ANAEROBIC Blood Culture results may not be optimal due to an inadequate volume of blood received in culture bottles Performed at [facility], [address], [place], [place] [address]    Culture   Final    NO GROWTH 3 DAYS Performed at [facility], [place] [address] [place], [place] [address]    Report Status PENDING  Incomplete  Culture, blood (Routine X 2) w Reflex to ID Panel     Status: None (Preliminary result)   Collection Time: 06/15/22  4:14 AM   Specimen: BLOOD  Result Value Ref Range Status   Specimen Description   Final    BLOOD BLOOD RIGHT HAND Performed at [facility], [address], [place], [place] [address]    Special Requests   Final    BOTTLES DRAWN AEROBIC AND ANAEROBIC Blood Culture results may not be optimal due to an excessive  volume of blood received in culture bottles Performed at [facility], [address], [place], [place] [address]    Culture   Final    NO GROWTH 3 DAYS Performed at [facility], [place] [address] [place],  [address]    Report Status PENDING  Incomplete  Gastrointestinal Panel by PCR , Stool     Status: Abnormal   Collection Time: 06/15/22  5:36 PM   Specimen: Stool  Result Value Ref Range Status   Campylobacter species NOT DETECTED NOT DETECTED Final   Plesimonas shigelloides NOT DETECTED NOT DETECTED Final   Salmonella species NOT DETECTED NOT DETECTED Final   Yersinia enterocolitica DETECTED (A) NOT DETECTED Final    Comment: CRITICAL RESULT CALLED TO, READ BACK BY AND VERIFIED WITH: LUANN HUMPRIES 06/15/2022 AT 1930 SRR    Vibrio species NOT DETECTED NOT DETECTED Final   Vibrio cholerae NOT DETECTED NOT DETECTED Final   Enteroaggregative E coli (EAEC) NOT DETECTED NOT DETECTED Final   Enteropathogenic E coli (EPEC) NOT DETECTED NOT DETECTED Final   Enterotoxigenic E coli (ETEC) NOT DETECTED NOT DETECTED Final   Shiga like toxin producing E coli (STEC) NOT DETECTED NOT DETECTED Final   Shigella/Enteroinvasive E coli (EIEC) NOT DETECTED NOT DETECTED Final   Cryptosporidium NOT DETECTED NOT DETECTED Final   Cyclospora cayetanensis NOT DETECTED NOT DETECTED Final   Entamoeba histolytica NOT DETECTED NOT DETECTED Final   Giardia lamblia NOT DETECTED NOT DETECTED Final   Adenovirus F40/41 NOT DETECTED NOT DETECTED Final   Astrovirus NOT DETECTED NOT DETECTED Final   Norovirus GI/GII NOT DETECTED NOT DETECTED Final   Rotavirus A NOT DETECTED NOT DETECTED Final   Sapovirus (I, II, IV, and V) NOT DETECTED NOT DETECTED Final    Comment: Performed at [facility], [address] [place],  [address]     Labs: CBC: Recent Labs  Lab 06/14/22 1542 06/15/22 0405 06/16/22 0542 06/17/22 0926 07/04/2022 0624  WBC 8.5 8.0 6.8  7.4 6.0  NEUTROABS 6.3   --   --   --   --   HGB 12.3* 11.9* 13.1 12.5* 11.1*  HCT 32.6* 34.0* 37.9* 35.7* 32.0*  MCV 97 104.0* 103.8* 105.0* 104.2*  PLT 51* 57* 61* 73* 61*   Basic Metabolic Panel: Recent Labs  Lab 06/14/22 0447 06/15/22 0405 06/16/22 0542 06/17/22 0926 07/14/22 0624  NA 133* 134* 134* 136 135  K 3.7 3.5 3.6 3.6 3.3*  CL 105 108 106 107 108  CO2 21* 20* 18* 19* 20*  GLUCOSE 241* 180* 205* 206* 168*  BUN 38* 47* 58* 78* 84*  CREATININE 0.73 1.62* 1.78* 2.68* 3.45*  CALCIUM 8.3* 8.0* 8.3* 8.3* 7.8*  MG 2.2 2.1 2.2 2.4 2.3  PHOS 2.2* 3.9 3.7 4.2 4.7*   Liver Function Tests: Recent Labs  Lab 06/14/22 0447 06/15/22 0405 06/16/22 0542 06/17/22 0926 07/14/2022 0624  AST 98* 114* 131* 133* 105*  ALT 39 44 54* 51* 46*  ALKPHOS 94 98 143* 154* 102  BILITOT 13.8* 17.2* 21.0* 24.1* 24.6*  PROT 6.3* 6.3* 6.5 6.3* 5.7*  ALBUMIN 1.9* 1.8* 1.8* 1.7* 2.1*   No results for input(s): "LIPASE", "AMYLASE" in the last 168 hours. No results for input(s): "AMMONIA" in the last 168 hours. Cardiac Enzymes: No results for input(s): "CKTOTAL", "CKMB", "CKMBINDEX", "TROPONINI" in the last 168 hours. BNP (last 3 results) Recent Labs    06/25/2022 1132  BNP 410.8*   CBG: Recent Labs  Lab Jul 14, 2022 0412 July 14, 2022 0725 July 14, 2022 1114 07-14-22 1610 2022-07-14 1849  GLUCAP 180* 159* 117* 82 97    Time spent: 35 minutes  Signed:  Val Riles  [facility] 06/19/2022  7:52 AM

## 2022-06-30 NOTE — Progress Notes (Signed)
[facility] Progress Note  Patient: Johnny Kelley    [id number]  DOA: 06/11/2022     Date of Service: the patient was seen and examined on 07/17/22  Chief Complaint  Patient presents with   Altered Mental Status   Brief hospital course: Johnny Kelley is a 57 y.o. male with medical history significant of hypertension, hyperlipidemia, diabetes mellitus, diastolic CHF, stroke, GERD, BPH, polysubstance abuse (alcohol, tobacco and cocaine), hepatocellular carcinoma (s/p of embolization treatment in [facility] per his brother), liver cirrhosis, who presents with altered mental status, fall and back pain. Per his brother (I called his brother by phone), patient has chronic back pain with abnormal MRI findings. Pt has an appointment with neurosurgeon, Dr. Cari Caraway on 3/14.  Due to back pain, patient has difficulty walking and decreased mobility.  It is very difficult for his brother to take care of him at home. Patient has been confused in the past several days. In the ED  patient was very lethargic, mildly confused, but still orientated x 3.  He did complain of mid and lower back pain, which is constant, severe, nonradiating.  He also has bilateral leg weakness.    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  CT head negative.  UDS negative. -Frequent neurocheck -Fall precaution -Lactulose 20 g 3 times daily via NG tube, s/p lactulose enema d/c'd  Patient had fever, empirically patient received ceftriaxone, vancomycin which has been discontinued after diagnosis of cryptococcal meningitis.  DC rifaximin as well. 3/15 herpetic lesion noticed on upper lip, may be viral encephalitis causing AMS Lumbar puncture ordered. S/p acyclovir IV was given which was discontinued after diagnosis of cryptococcal meningitis [place] abd was done for paracentesis but there was no fluid to  Tap, so could not r/o SBP 3/13 blood culture positive for Cryptococcus neoformans/gattii  3/15 CSF shows cryptococcal antigen titer 2560, CSF WBC 148-212, glucose 72, RBC 31, protein 195 3/15 NG tube was inserted for feeding, dietitian consulted. 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 GI panel positive 3/18 started ciprofloxacin 400 IV twice daily   AKI most likely ATN due to amphotericin B Hypotension, dehydration and diarrhea could be contributing factors Monitor renal functions and urine output daily Avoid nephrotoxic medications and use renally dose medications Started IV fluid for gentle hydration Urine retention, Foley catheter was inserted on 06/14/2022 Cr 1.78 --2.68--3.45 BUN 84 Co 20 Nephrology consulted, started bicarbonate infusion, continue midodrine, started IV albumin and IVF x 3 days to prevent prerenal azotemia     Hyperbilirubinemia secondary to sepsis AST 131 elevated, ALT 54  Total bilirubin 24, DB 14.3 and IB 9.8 [place] abd: Cirrhotic liver morphology without focal hepatic lesion.  Mild cholelithiasis. Gallbladder wall thickening may be due to chronic cholecystitis or hepatitis. If there is high clinical concern for cholecystitis, further evaluation with HIDA scan should be performed. Monitor LFTs HIDA scan: Unable to evaluate gallbladder filling due to poor excretion by the liver related patient hepatic dysfunction and bilirubin equal 24. Faint counts are evident within the small bowel consistent patent common bile duct. F/u CT A/p    Cirrhosis [facility]): Elevated LFTs -Cautiously use Tylenol -Patient will need counseling about importance of quitting alcohol drinking Elevated bilirubin Coagulopathy, INR 2.1 on 3/22 to hepatic failure  Fall at home, initial encounter:  CT head and neck negative. -Fall precaution -PT/OT   Hyperkalemia: Resolved  S/p Leukoma 5 g and s/p 40 mg Lasix due to elevated BNP   Hypokalemia, potassium 3.3  Monitor K level and replete cautiously due to AKI Hypophosphatemia, Phos repleted. Hyperphosphatemia due to AKI Monitor electrolytes and manage accordingly   HTN (hypertension) D/c'd Amlodipine, HCTZ, metoprolol, due to hypotension -Hold losartan due to hyperkalemia S/p NS 500 mL bolus given 3/16 started midodrine 10 mg p.o. 3 times daily  3/15 SVT 6 beats followed by 16 beats, patient was unable to offer any complaint due to encephalopathy.  Lopressor 5 mg IV 1 dose given. mg 1.9 wnl 3/16 s/p metoprolol 5 mg IV TID, DC'd on 3/19 due to low blood pressure We will continue to monitor BP and titrate medications accordingly 3/19 NS 500 mL bolus ordered  History of CVA (cerebrovascular accident) -Patient is not taking Plavix due to easy bleeding   Chronic diastolic CHF (congestive heart failure) ([place]): 2D echo on 06/24/2021 showed EF of 60 to 65% with grade 1 diastolic dysfunction.  BNP is elevated 418 and chest x-ray with vascular crowding, but patient only has trace leg edema, no JVT, no shortness of breath or oxygen desaturation.  Does not seem to have CHF exacerbation.  Patient is at risk of developing CHF exacerbation -s/p 1 dose of Lasix to 40 mg x 1  -Held HCTZ due to low blood pressure   Type 2 diabetes mellitus with diabetic neuropathy, without long-term current use of insulin ([place]): Recent A1c 5.7, well-controlled.  Patient taking Trulicity, metformin and Jardiance. -Sliding scale insulin 3/15 increased Semglee 22 units daily  BPH with obstruction/lower urinary tract symptoms -Resume Flomax when able to take oral medications   Thrombocytopenia ([place]): this is a chronic issue due to liver cirrhosis. Plts 38--48--46--61 -Follow-up CBC  Alcohol abuse and Tobacco abuse -Did counseling about importance of quitting  alcohol use and tobacco use -CIWA protocol -Nicotine patch   Back pain: Patient has chronic mid and lower back pain, with abnormal MRI findings as above.  Patient has an appointment with Dr. Cari Caraway of neurosurgery on 3/14 -Consulted Dr. Cari Caraway for neurosurgery, recommended no intervention until his mental status improves.   Body mass index is 24.48 kg/m.  Interventions:    Diet: NG tube feeding, SLP eval when awake and alert  DVT Prophylaxis: SCD, pharmacological prophylaxis contraindicated due to thrombocytopenia and bleeding    Advance goals of care discussion: DNR  Family Communication: family was not present at bedside, at the time of interview.  The pt provided permission to discuss medical plan with the family. Opportunity was given to ask question and all questions were answered satisfactorily.  3/16 d/w patient's brother over the phone  Disposition:  Pt is from Home, admitted with AMS, found to have cryptococcal meningitis, on IV antifungal medication, ID consulted, which precludes a safe discharge. Discharge to SNF, when clinically stable.  Subjective: No significant overnight events, today patient was obtunded, unable to communicate, very lethargic, unable to offer any complaints.  Physical Exam: General: NAD, lying comfortably Appear in no distress Eyes: closed, spontaneously opening briefly, Icteric sclera  ENT: Oral Mucosa Clear, dry,  Neck: no JVD,  Cardiovascular: S1 and S2 Present, no Murmur,  Respiratory: good respiratory effort, Bilateral Air entry equal and Decreased, no Crackles, no wheezes Abdomen: Bowel Sound present, Soft and no tenderness, genital warts Skin: no rashes, jaundice yellow skin color Extremities: no Pedal edema, no  calf tenderness Neurologic: Obtunded, lethargic,, no any focal findings Gait not checked due to patient safety concerns  Vitals:   2022-07-08 0415 July 08, 2022 0418 2022-07-08 0729 08-Jul-2022 1114  BP: (!) 89/48  (!) 141/107 (!)  81/44  Pulse: 79  81 76  Resp: 20  16 18   Temp:   98 F (36.7 C) (!) 97.4 F (36.3 C)  TempSrc:    Oral  SpO2: 95%  100% 95%  Weight:  90.9 kg    Height:        Intake/Output Summary (Last 24 hours) at 08-Jul-2022 1537 Last data filed at 2022-07-08 [id number] Gross per 24 hour  Intake --  Output 300 ml  Net -300 ml   Filed Weights   06/15/22 0500 06/16/22 0500 2022-07-08 0418  Weight: 86.6 kg 90.2 kg 90.9 kg    Data Reviewed: I have personally reviewed and interpreted daily labs, tele strips, imagings as discussed above. I reviewed all nursing notes, pharmacy notes, vitals, pertinent old records I have discussed plan of care as described above with RN and patient/family.  CBC: Recent Labs  Lab 06/14/22 1542 06/15/22 0405 06/16/22 0542 06/17/22 0926 07/08/2022 0624  WBC 8.5 8.0 6.8 7.4 6.0  NEUTROABS 6.3  --   --   --   --   HGB 12.3* 11.9* 13.1 12.5* 11.1*  HCT 32.6* 34.0* 37.9* 35.7* 32.0*  MCV 97 104.0* 103.8* 105.0* 104.2*  PLT 51* 57* 61* 73* 61*   Basic Metabolic Panel: Recent Labs  Lab 06/14/22 0447 06/15/22 0405 06/16/22 0542 06/17/22 0926 July 08, 2022 0624  NA 133* 134* 134* 136 135  K 3.7 3.5 3.6 3.6 3.3*  CL 105 108 106 107 108  CO2 21* 20* 18* 19* 20*  GLUCOSE 241* 180* 205* 206* 168*  BUN 38* 47* 58* 78* 84*  CREATININE 0.73 1.62* 1.78* 2.68* 3.45*  CALCIUM 8.3* 8.0* 8.3* 8.3* 7.8*  MG 2.2 2.1 2.2 2.4 2.3  PHOS 2.2* 3.9 3.7 4.2 4.7*    Studies: NM Hepatobiliary Liver Func  Result Date: 08-Jul-2022 CLINICAL DATA:  Gallbladder wall thickening identified on ultrasound. Patient with elevated bilirubin of 24.6. No evidence of biliary obstruction on ultrasound. Patient with alcoholic cirrhosis. EXAM: NUCLEAR MEDICINE HEPATOBILIARY IMAGING TECHNIQUE: Sequential images of the abdomen were obtained out to 60 minutes following intravenous administration of radiopharmaceutical. RADIOPHARMACEUTICALS:  10.0 mCi Tc-76m  Choletec IV COMPARISON:  0 ultrasound 06/16/2022  FINDINGS: There was poor extraction of radiotracer from the blood pool. At 90 imaging in there remains significant activity in the blood pool. Blood pool activity in the heart is greater than uptake in the liver. This is a function patient's high bilirubin level. At 90 minutes of imaging there is faint activity noted in the bowel indicating patent common bile duct. Insufficient radiotracer is excreted to evaluate the gallbladder. IMPRESSION: 1. Unable to evaluate gallbladder filling due to poor excretion by the liver related patient hepatic dysfunction and bilirubin equal 24. 2. Faint counts are evident within the small bowel consistent patent common bile duct. Electronically Signed   By: Suzy Bouchard M.D.   On: 2022/07/08 15:12    Scheduled Meds:  Chlorhexidine Gluconate Cloth  6 each Topical Q0600   clotrimazole   Topical BID   dextrose  10 mL Intravenous Q24H   dextrose  10 mL Intravenous Q24H   flucytosine  25 mg/kg Per Tube [id number]   folic acid  1 mg Per Tube Daily   free water  30 mL Per Tube Q4H  insulin aspart  0-5 Units Subcutaneous QHS   insulin aspart  0-9 Units Subcutaneous TID WC   insulin glargine-yfgn  22 Units Subcutaneous Daily   iohexol  500 mL Oral Q1H   lactulose  20 g Per Tube TID   lidocaine  1 patch Transdermal Q24H   midodrine  10 mg Per NG tube TID WC   multivitamin  15 mL Per Tube Daily   nicotine  21 mg Transdermal Daily   pantoprazole (PROTONIX) IV  40 mg Intravenous Q24H   phenol  1 spray Mouth/Throat TID   potassium chloride  20 mEq Per Tube Once   sodium chloride  500 mL Intravenous Q24H   sodium chloride  500 mL Intravenous Q24H   thiamine  100 mg Per Tube Daily   Or   thiamine  100 mg Intravenous Daily   Continuous Infusions:  albumin human 60 mL/hr at Jun 28, 2022 1526   amphotericin B liposome (AMBISOME) 260 mg in dextrose 5 % 500 mL IVPB Stopped (06/28/22 0255)   chlorproMAZINE (THORAZINE) 12.5 mg in sodium chloride 0.9 % 25 mL IVPB 50 mL/hr at 06/16/22  1147   ciprofloxacin     feeding supplement (GLUCERNA 1.5 CAL) 70 mL/hr at 06/17/22 0700   sodium bicarbonate 150 mEq in dextrose 5 % 1,150 mL infusion Stopped (2022/06/28 1254)   PRN Meds: acetaminophen, acetaminophen, chlorproMAZINE (THORAZINE) 12.5 mg in sodium chloride 0.9 % 25 mL IVPB, diphenhydrAMINE **OR** diphenhydrAMINE, hydrALAZINE, HYDROmorphone (DILAUDID) injection, liver oil-zinc oxide, LORazepam, meperidine (DEMEROL) injection, nystatin, ondansetron (ZOFRAN) IV, oxyCODONE  Time spent: 50 minutes  Author: Val Riles. MD [facility] 2022-06-28 3:37 PM  To reach On-call, see care teams to locate the attending and reach out to them via [email or web address]. If 7PM-7AM, please contact night-coverage If you still have difficulty reaching the attending provider, please page the [facility] (Director on Call) for [facility] on amion for assistance.

## 2022-06-30 NOTE — Progress Notes (Signed)
Dr Val Riles notified of periods of apnea with labored breathing.  Comfort measures initiated.

## 2022-06-30 NOTE — Progress Notes (Signed)
Call into room to evaluate patient. Patient noted to be having periods of apnea 10-15 secs long. Pupils fixed. Increased jaundice noted to sclera. BLE beginning to mottle. Patient completely unresponsive. MD made aware.

## 2022-06-30 NOTE — Progress Notes (Signed)
       CROSS COVER NOTE  NAME: Johnny Kelley MRN: [id number] DOB : 1965-11-02 ATTENDING PHYSICIAN: Val Riles, MD    Notified by RN that patient has expired at [place]  Patient was DNR/DNI and comfort care  2 RN verified.  Family was notified by nursing staff    This document was prepared using Dragon voice recognition software and may include unintentional dictation errors.  Neomia Glass DNP, MBA, FNP-BC Nurse Practitioner [facility] Pager [phone]

## 2022-06-30 NOTE — Progress Notes (Signed)
Patient expired at 1916, hospitalist notified, family notified, [place] notified.

## 2022-06-30 DEATH — deceased

## 2022-07-07 ENCOUNTER — Ambulatory Visit: Payer: Medicaid Other | Admitting: Podiatry

## 2022-07-07 LAB — CULTURE, FUNGUS WITHOUT SMEAR

## 2022-08-20 ENCOUNTER — Ambulatory Visit: Payer: Medicaid Other | Admitting: Dermatology

## 2022-11-11 ENCOUNTER — Other Ambulatory Visit: Payer: Medicaid Other

## 2022-11-17 ENCOUNTER — Ambulatory Visit: Payer: Medicaid Other | Admitting: Oncology

## 2022-11-17 ENCOUNTER — Other Ambulatory Visit: Payer: Medicaid Other
